# Patient Record
Sex: Female | Born: 1940 | Race: White | Hispanic: No | Marital: Single | Smoking: Never smoker
Health system: Southern US, Community
[De-identification: ages and names within clinical notes are randomized; demographics above are authoritative.]

## PROBLEM LIST (undated history)

## (undated) DIAGNOSIS — M8580 Other specified disorders of bone density and structure, unspecified site: Secondary | ICD-10-CM

## (undated) DIAGNOSIS — K529 Noninfective gastroenteritis and colitis, unspecified: Secondary | ICD-10-CM

## (undated) DIAGNOSIS — A809 Acute poliomyelitis, unspecified: Secondary | ICD-10-CM

## (undated) DIAGNOSIS — S5291XA Unspecified fracture of right forearm, initial encounter for closed fracture: Secondary | ICD-10-CM

## (undated) DIAGNOSIS — R011 Cardiac murmur, unspecified: Secondary | ICD-10-CM

## (undated) DIAGNOSIS — I00 Rheumatic fever without heart involvement: Secondary | ICD-10-CM

## (undated) DIAGNOSIS — G479 Sleep disorder, unspecified: Secondary | ICD-10-CM

## (undated) DIAGNOSIS — M353 Polymyalgia rheumatica: Secondary | ICD-10-CM

## (undated) DIAGNOSIS — E785 Hyperlipidemia, unspecified: Secondary | ICD-10-CM

## (undated) DIAGNOSIS — I639 Cerebral infarction, unspecified: Secondary | ICD-10-CM

## (undated) DIAGNOSIS — I10 Essential (primary) hypertension: Secondary | ICD-10-CM

## (undated) DIAGNOSIS — R58 Hemorrhage, not elsewhere classified: Secondary | ICD-10-CM

## (undated) DIAGNOSIS — K219 Gastro-esophageal reflux disease without esophagitis: Secondary | ICD-10-CM

## (undated) HISTORY — DX: Essential (primary) hypertension: I10

## (undated) HISTORY — DX: Rheumatic fever without heart involvement: I00

## (undated) HISTORY — DX: Hyperlipidemia, unspecified: E78.5

## (undated) HISTORY — PX: TONSILLECTOMY: SUR1361

## (undated) HISTORY — DX: Gastro-esophageal reflux disease without esophagitis: K21.9

## (undated) HISTORY — DX: Sleep disorder, unspecified: G47.9

## (undated) HISTORY — DX: Other specified disorders of bone density and structure, unspecified site: M85.80

## (undated) HISTORY — DX: Noninfective gastroenteritis and colitis, unspecified: K52.9

## (undated) HISTORY — PX: CATARACT EXTRACTION: SUR2

## (undated) HISTORY — DX: Cardiac murmur, unspecified: R01.1

## (undated) HISTORY — DX: Cerebral infarction, unspecified: I63.9

## (undated) HISTORY — DX: Polymyalgia rheumatica: M35.3

## (undated) HISTORY — DX: Hemorrhage, not elsewhere classified: R58

## (undated) HISTORY — DX: Acute poliomyelitis, unspecified: A80.9

---

## 1984-03-24 HISTORY — PX: BREAST LUMPECTOMY: SHX2

## 1984-03-24 HISTORY — PX: NECK SURGERY: SHX720

## 1997-09-13 ENCOUNTER — Ambulatory Visit (HOSPITAL_COMMUNITY): Admission: RE | Admit: 1997-09-13 | Discharge: 1997-09-13 | Payer: Self-pay | Admitting: Obstetrics and Gynecology

## 1997-11-24 ENCOUNTER — Other Ambulatory Visit: Admission: RE | Admit: 1997-11-24 | Discharge: 1997-11-24 | Payer: Self-pay | Admitting: Obstetrics and Gynecology

## 1998-10-01 ENCOUNTER — Encounter: Payer: Self-pay | Admitting: Obstetrics and Gynecology

## 1998-10-01 ENCOUNTER — Ambulatory Visit (HOSPITAL_COMMUNITY): Admission: RE | Admit: 1998-10-01 | Discharge: 1998-10-01 | Payer: Self-pay | Admitting: Obstetrics and Gynecology

## 1998-11-28 ENCOUNTER — Other Ambulatory Visit: Admission: RE | Admit: 1998-11-28 | Discharge: 1998-11-28 | Payer: Self-pay | Admitting: Obstetrics and Gynecology

## 1999-01-17 ENCOUNTER — Encounter (INDEPENDENT_AMBULATORY_CARE_PROVIDER_SITE_OTHER): Payer: Self-pay

## 1999-01-17 ENCOUNTER — Other Ambulatory Visit: Admission: RE | Admit: 1999-01-17 | Discharge: 1999-01-17 | Payer: Self-pay | Admitting: Ophthalmology

## 1999-10-08 ENCOUNTER — Encounter: Payer: Self-pay | Admitting: Obstetrics and Gynecology

## 1999-10-08 ENCOUNTER — Ambulatory Visit (HOSPITAL_COMMUNITY): Admission: RE | Admit: 1999-10-08 | Discharge: 1999-10-08 | Payer: Self-pay | Admitting: Obstetrics and Gynecology

## 1999-12-02 ENCOUNTER — Other Ambulatory Visit: Admission: RE | Admit: 1999-12-02 | Discharge: 1999-12-02 | Payer: Self-pay | Admitting: Obstetrics and Gynecology

## 1999-12-23 HISTORY — PX: DILATION AND CURETTAGE OF UTERUS: SHX78

## 2000-01-09 ENCOUNTER — Encounter: Payer: Self-pay | Admitting: Obstetrics and Gynecology

## 2000-01-14 ENCOUNTER — Encounter (INDEPENDENT_AMBULATORY_CARE_PROVIDER_SITE_OTHER): Payer: Self-pay

## 2000-01-14 ENCOUNTER — Ambulatory Visit (HOSPITAL_COMMUNITY): Admission: RE | Admit: 2000-01-14 | Discharge: 2000-01-14 | Payer: Self-pay | Admitting: Obstetrics and Gynecology

## 2000-04-22 ENCOUNTER — Ambulatory Visit (HOSPITAL_COMMUNITY): Admission: RE | Admit: 2000-04-22 | Discharge: 2000-04-22 | Payer: Self-pay | Admitting: Gastroenterology

## 2000-05-04 ENCOUNTER — Ambulatory Visit (HOSPITAL_COMMUNITY): Admission: RE | Admit: 2000-05-04 | Discharge: 2000-05-04 | Payer: Self-pay | Admitting: Obstetrics and Gynecology

## 2000-05-04 ENCOUNTER — Encounter: Payer: Self-pay | Admitting: Obstetrics and Gynecology

## 2000-10-13 ENCOUNTER — Ambulatory Visit (HOSPITAL_COMMUNITY): Admission: RE | Admit: 2000-10-13 | Discharge: 2000-10-13 | Payer: Self-pay | Admitting: Obstetrics and Gynecology

## 2000-10-13 ENCOUNTER — Encounter: Payer: Self-pay | Admitting: Obstetrics and Gynecology

## 2000-11-02 ENCOUNTER — Ambulatory Visit (HOSPITAL_COMMUNITY): Admission: RE | Admit: 2000-11-02 | Discharge: 2000-11-02 | Payer: Self-pay | Admitting: Obstetrics and Gynecology

## 2000-11-02 ENCOUNTER — Encounter: Payer: Self-pay | Admitting: Obstetrics and Gynecology

## 2000-12-21 ENCOUNTER — Other Ambulatory Visit: Admission: RE | Admit: 2000-12-21 | Discharge: 2000-12-21 | Payer: Self-pay | Admitting: Obstetrics and Gynecology

## 2001-05-04 ENCOUNTER — Encounter: Payer: Self-pay | Admitting: Obstetrics and Gynecology

## 2001-05-04 ENCOUNTER — Ambulatory Visit (HOSPITAL_COMMUNITY): Admission: RE | Admit: 2001-05-04 | Discharge: 2001-05-04 | Payer: Self-pay | Admitting: Obstetrics and Gynecology

## 2001-10-15 ENCOUNTER — Encounter: Payer: Self-pay | Admitting: Obstetrics and Gynecology

## 2001-10-15 ENCOUNTER — Ambulatory Visit (HOSPITAL_COMMUNITY): Admission: RE | Admit: 2001-10-15 | Discharge: 2001-10-15 | Payer: Self-pay | Admitting: Obstetrics and Gynecology

## 2002-10-20 ENCOUNTER — Ambulatory Visit (HOSPITAL_COMMUNITY): Admission: RE | Admit: 2002-10-20 | Discharge: 2002-10-20 | Payer: Self-pay | Admitting: Obstetrics and Gynecology

## 2002-10-20 ENCOUNTER — Encounter: Payer: Self-pay | Admitting: Obstetrics and Gynecology

## 2003-06-20 ENCOUNTER — Encounter: Admission: RE | Admit: 2003-06-20 | Discharge: 2003-06-20 | Payer: Self-pay | Admitting: Internal Medicine

## 2003-06-26 ENCOUNTER — Encounter: Admission: RE | Admit: 2003-06-26 | Discharge: 2003-06-26 | Payer: Self-pay | Admitting: Internal Medicine

## 2003-06-29 ENCOUNTER — Ambulatory Visit (HOSPITAL_COMMUNITY): Admission: RE | Admit: 2003-06-29 | Discharge: 2003-06-29 | Payer: Self-pay | Admitting: Gastroenterology

## 2003-11-14 ENCOUNTER — Ambulatory Visit (HOSPITAL_COMMUNITY): Admission: RE | Admit: 2003-11-14 | Discharge: 2003-11-14 | Payer: Self-pay | Admitting: Obstetrics and Gynecology

## 2004-03-24 HISTORY — PX: COLONOSCOPY: SHX174

## 2004-07-12 ENCOUNTER — Ambulatory Visit: Payer: Self-pay | Admitting: Gastroenterology

## 2004-07-15 ENCOUNTER — Ambulatory Visit: Payer: Self-pay | Admitting: Gastroenterology

## 2004-08-16 ENCOUNTER — Ambulatory Visit: Payer: Self-pay | Admitting: Gastroenterology

## 2004-09-18 ENCOUNTER — Ambulatory Visit: Payer: Self-pay | Admitting: Gastroenterology

## 2004-11-14 ENCOUNTER — Ambulatory Visit: Payer: Self-pay | Admitting: Gastroenterology

## 2004-11-19 ENCOUNTER — Ambulatory Visit (HOSPITAL_COMMUNITY): Admission: RE | Admit: 2004-11-19 | Discharge: 2004-11-19 | Payer: Self-pay | Admitting: Obstetrics and Gynecology

## 2004-12-05 ENCOUNTER — Encounter: Payer: Self-pay | Admitting: Internal Medicine

## 2004-12-09 ENCOUNTER — Ambulatory Visit: Payer: Self-pay | Admitting: Gastroenterology

## 2004-12-13 ENCOUNTER — Ambulatory Visit: Payer: Self-pay | Admitting: Gastroenterology

## 2005-01-13 ENCOUNTER — Ambulatory Visit: Payer: Self-pay | Admitting: Gastroenterology

## 2005-01-22 ENCOUNTER — Encounter: Payer: Self-pay | Admitting: Internal Medicine

## 2005-01-22 LAB — CONVERTED CEMR LAB

## 2005-02-14 ENCOUNTER — Ambulatory Visit: Payer: Self-pay | Admitting: Internal Medicine

## 2005-02-20 ENCOUNTER — Ambulatory Visit: Payer: Self-pay

## 2005-03-07 ENCOUNTER — Ambulatory Visit: Payer: Self-pay | Admitting: Gastroenterology

## 2005-03-24 HISTORY — PX: MELANOMA EXCISION: SHX5266

## 2005-05-30 ENCOUNTER — Ambulatory Visit: Payer: Self-pay | Admitting: Gastroenterology

## 2005-08-25 ENCOUNTER — Ambulatory Visit: Payer: Self-pay | Admitting: Gastroenterology

## 2005-08-28 ENCOUNTER — Ambulatory Visit: Payer: Self-pay | Admitting: Internal Medicine

## 2005-09-09 ENCOUNTER — Ambulatory Visit: Payer: Self-pay | Admitting: Internal Medicine

## 2005-11-14 ENCOUNTER — Ambulatory Visit: Payer: Self-pay | Admitting: Gastroenterology

## 2005-11-21 ENCOUNTER — Ambulatory Visit (HOSPITAL_COMMUNITY): Admission: RE | Admit: 2005-11-21 | Discharge: 2005-11-21 | Payer: Self-pay | Admitting: Obstetrics and Gynecology

## 2005-12-17 ENCOUNTER — Ambulatory Visit (HOSPITAL_BASED_OUTPATIENT_CLINIC_OR_DEPARTMENT_OTHER): Admission: RE | Admit: 2005-12-17 | Discharge: 2005-12-17 | Payer: Self-pay | Admitting: *Deleted

## 2005-12-17 ENCOUNTER — Encounter (INDEPENDENT_AMBULATORY_CARE_PROVIDER_SITE_OTHER): Payer: Self-pay | Admitting: Specialist

## 2006-02-05 ENCOUNTER — Ambulatory Visit: Payer: Self-pay | Admitting: Internal Medicine

## 2006-02-05 LAB — CONVERTED CEMR LAB
Cholesterol: 200 mg/dL (ref 0–200)
HDL: 69 mg/dL (ref >39.0)
LDL Cholesterol: 108 mg/dL — ABNORMAL HIGH (ref 0–99)
Triglyceride fasting, serum: 115 mg/dL (ref 0–149)
VLDL: 23 mg/dL (ref 0–40)

## 2006-02-09 ENCOUNTER — Ambulatory Visit: Payer: Self-pay | Admitting: Gastroenterology

## 2006-05-04 ENCOUNTER — Ambulatory Visit: Payer: Self-pay | Admitting: Gastroenterology

## 2006-05-18 ENCOUNTER — Ambulatory Visit: Payer: Self-pay | Admitting: Gastroenterology

## 2006-09-07 ENCOUNTER — Ambulatory Visit: Payer: Self-pay | Admitting: Gastroenterology

## 2006-11-24 ENCOUNTER — Ambulatory Visit (HOSPITAL_COMMUNITY): Admission: RE | Admit: 2006-11-24 | Discharge: 2006-11-24 | Payer: Self-pay | Admitting: Internal Medicine

## 2006-11-30 ENCOUNTER — Ambulatory Visit: Payer: Self-pay | Admitting: Gastroenterology

## 2007-03-01 ENCOUNTER — Ambulatory Visit: Payer: Self-pay | Admitting: Gastroenterology

## 2007-06-07 ENCOUNTER — Telehealth (INDEPENDENT_AMBULATORY_CARE_PROVIDER_SITE_OTHER): Payer: Self-pay | Admitting: *Deleted

## 2007-06-08 ENCOUNTER — Telehealth (INDEPENDENT_AMBULATORY_CARE_PROVIDER_SITE_OTHER): Payer: Self-pay | Admitting: *Deleted

## 2007-06-08 ENCOUNTER — Encounter: Payer: Self-pay | Admitting: Internal Medicine

## 2007-06-08 DIAGNOSIS — M899 Disorder of bone, unspecified: Secondary | ICD-10-CM | POA: Insufficient documentation

## 2007-06-08 DIAGNOSIS — M949 Disorder of cartilage, unspecified: Secondary | ICD-10-CM

## 2007-06-08 DIAGNOSIS — K5190 Ulcerative colitis, unspecified, without complications: Secondary | ICD-10-CM

## 2007-06-08 DIAGNOSIS — R011 Cardiac murmur, unspecified: Secondary | ICD-10-CM

## 2007-06-18 ENCOUNTER — Ambulatory Visit: Payer: Self-pay | Admitting: Internal Medicine

## 2007-06-18 DIAGNOSIS — R498 Other voice and resonance disorders: Secondary | ICD-10-CM | POA: Insufficient documentation

## 2007-06-18 DIAGNOSIS — I10 Essential (primary) hypertension: Secondary | ICD-10-CM | POA: Insufficient documentation

## 2007-06-18 DIAGNOSIS — J309 Allergic rhinitis, unspecified: Secondary | ICD-10-CM | POA: Insufficient documentation

## 2007-06-19 LAB — CONVERTED CEMR LAB
Creatinine, Ser: 0.8 mg/dL (ref 0.4–1.2)
Free T4: 0.7 ng/dL (ref 0.6–1.6)
Potassium: 4.2 meq/L (ref 3.5–5.1)
TSH: 1.06 microintl units/mL (ref 0.35–5.50)

## 2007-06-21 ENCOUNTER — Encounter (INDEPENDENT_AMBULATORY_CARE_PROVIDER_SITE_OTHER): Payer: Self-pay | Admitting: *Deleted

## 2007-06-22 ENCOUNTER — Encounter: Payer: Self-pay | Admitting: Internal Medicine

## 2007-06-23 ENCOUNTER — Encounter (INDEPENDENT_AMBULATORY_CARE_PROVIDER_SITE_OTHER): Payer: Self-pay | Admitting: *Deleted

## 2007-07-05 ENCOUNTER — Telehealth (INDEPENDENT_AMBULATORY_CARE_PROVIDER_SITE_OTHER): Payer: Self-pay | Admitting: *Deleted

## 2007-07-08 ENCOUNTER — Encounter: Payer: Self-pay | Admitting: Internal Medicine

## 2007-07-12 ENCOUNTER — Ambulatory Visit: Payer: Self-pay | Admitting: Internal Medicine

## 2007-08-31 ENCOUNTER — Ambulatory Visit: Payer: Self-pay | Admitting: Gastroenterology

## 2007-10-05 ENCOUNTER — Ambulatory Visit: Payer: Self-pay | Admitting: Gastroenterology

## 2007-11-26 ENCOUNTER — Ambulatory Visit (HOSPITAL_COMMUNITY): Admission: RE | Admit: 2007-11-26 | Discharge: 2007-11-26 | Payer: Self-pay | Admitting: Internal Medicine

## 2008-11-09 ENCOUNTER — Ambulatory Visit: Payer: Self-pay | Admitting: Internal Medicine

## 2008-11-09 DIAGNOSIS — R7309 Other abnormal glucose: Secondary | ICD-10-CM

## 2008-11-09 DIAGNOSIS — E785 Hyperlipidemia, unspecified: Secondary | ICD-10-CM | POA: Insufficient documentation

## 2008-11-14 ENCOUNTER — Ambulatory Visit: Payer: Self-pay | Admitting: Internal Medicine

## 2008-11-28 ENCOUNTER — Ambulatory Visit (HOSPITAL_COMMUNITY): Admission: RE | Admit: 2008-11-28 | Discharge: 2008-11-28 | Payer: Self-pay | Admitting: Internal Medicine

## 2008-11-28 ENCOUNTER — Encounter (INDEPENDENT_AMBULATORY_CARE_PROVIDER_SITE_OTHER): Payer: Self-pay | Admitting: *Deleted

## 2008-11-28 LAB — CONVERTED CEMR LAB
ALT: 13 units/L (ref 0–35)
BUN: 18 mg/dL (ref 6–23)
Basophils Relative: 0.2 % (ref 0.0–3.0)
Bilirubin, Direct: 0 mg/dL (ref 0.0–0.3)
CO2: 33 meq/L — ABNORMAL HIGH (ref 19–32)
Chloride: 100 meq/L (ref 96–112)
Eosinophils Relative: 0.3 % (ref 0.0–5.0)
GFR calc non Af Amer: 75.59 mL/min (ref >60)
Glucose, Bld: 91 mg/dL (ref 70–99)
HCT: 38.6 % (ref 36.0–46.0)
HDL: 60.4 mg/dL (ref >39.00)
Hgb A1c MFr Bld: 5.6 % (ref 4.6–6.5)
LDL Cholesterol: 105 mg/dL — ABNORMAL HIGH (ref 0–99)
Lymphs Abs: 0.7 10*3/uL (ref 0.7–4.0)
MCV: 88.8 fL (ref 78.0–100.0)
Monocytes Absolute: 0.5 10*3/uL (ref 0.1–1.0)
Monocytes Relative: 9.4 % (ref 3.0–12.0)
Platelets: 138 10*3/uL — ABNORMAL LOW (ref 150.0–400.0)
Potassium: 3.7 meq/L (ref 3.5–5.1)
RBC: 4.35 M/uL (ref 3.87–5.11)
Sodium: 140 meq/L (ref 135–145)
TSH: 1.72 microintl units/mL (ref 0.35–5.50)
Total Bilirubin: 0.8 mg/dL (ref 0.3–1.2)
Total CHOL/HDL Ratio: 3
VLDL: 25.2 mg/dL (ref 0.0–40.0)
Vit D, 25-Hydroxy: 27 ng/mL — ABNORMAL LOW (ref 30–89)
WBC: 5.2 10*3/uL (ref 4.5–10.5)

## 2008-12-01 ENCOUNTER — Ambulatory Visit: Payer: Self-pay | Admitting: Internal Medicine

## 2008-12-01 ENCOUNTER — Ambulatory Visit: Payer: Self-pay | Admitting: Family Medicine

## 2008-12-01 DIAGNOSIS — S51809A Unspecified open wound of unspecified forearm, initial encounter: Secondary | ICD-10-CM

## 2008-12-05 ENCOUNTER — Encounter: Payer: Self-pay | Admitting: Internal Medicine

## 2008-12-07 LAB — CONVERTED CEMR LAB
Alpha 1, Urine: DETECTED % — AB
Alpha 2, Urine: DETECTED % — AB
Beta, Urine: DETECTED % — AB
Total Protein, Urine-Ur/day: 50 mg/24hr (ref 10–140)
Total Protein, Urine: 9.9 mg/dL

## 2008-12-08 ENCOUNTER — Telehealth (INDEPENDENT_AMBULATORY_CARE_PROVIDER_SITE_OTHER): Payer: Self-pay | Admitting: *Deleted

## 2008-12-15 ENCOUNTER — Ambulatory Visit: Payer: Self-pay | Admitting: Internal Medicine

## 2008-12-15 DIAGNOSIS — L259 Unspecified contact dermatitis, unspecified cause: Secondary | ICD-10-CM

## 2009-02-01 ENCOUNTER — Ambulatory Visit: Payer: Self-pay | Admitting: Gastroenterology

## 2009-02-01 ENCOUNTER — Encounter: Payer: Self-pay | Admitting: Internal Medicine

## 2009-02-02 ENCOUNTER — Encounter (INDEPENDENT_AMBULATORY_CARE_PROVIDER_SITE_OTHER): Payer: Self-pay | Admitting: *Deleted

## 2009-02-13 ENCOUNTER — Ambulatory Visit: Payer: Self-pay | Admitting: Cardiology

## 2009-02-13 ENCOUNTER — Encounter: Payer: Self-pay | Admitting: Internal Medicine

## 2009-02-13 ENCOUNTER — Ambulatory Visit (HOSPITAL_COMMUNITY): Admission: RE | Admit: 2009-02-13 | Discharge: 2009-02-13 | Payer: Self-pay | Admitting: Internal Medicine

## 2009-02-13 ENCOUNTER — Ambulatory Visit: Payer: Self-pay

## 2009-02-14 ENCOUNTER — Encounter (INDEPENDENT_AMBULATORY_CARE_PROVIDER_SITE_OTHER): Payer: Self-pay | Admitting: *Deleted

## 2009-02-26 ENCOUNTER — Telehealth (INDEPENDENT_AMBULATORY_CARE_PROVIDER_SITE_OTHER): Payer: Self-pay | Admitting: *Deleted

## 2009-06-28 ENCOUNTER — Ambulatory Visit: Payer: Self-pay | Admitting: Internal Medicine

## 2009-06-28 DIAGNOSIS — R350 Frequency of micturition: Secondary | ICD-10-CM

## 2009-06-28 DIAGNOSIS — M545 Low back pain: Secondary | ICD-10-CM

## 2009-06-28 DIAGNOSIS — G479 Sleep disorder, unspecified: Secondary | ICD-10-CM

## 2009-06-28 LAB — CONVERTED CEMR LAB
Bilirubin Urine: NEGATIVE
Ketones, urine, test strip: NEGATIVE
Nitrite: POSITIVE
Protein, U semiquant: 30
Urobilinogen, UA: 0.2

## 2009-07-20 ENCOUNTER — Telehealth (INDEPENDENT_AMBULATORY_CARE_PROVIDER_SITE_OTHER): Payer: Self-pay | Admitting: *Deleted

## 2009-07-20 ENCOUNTER — Ambulatory Visit: Payer: Self-pay | Admitting: Internal Medicine

## 2009-07-24 ENCOUNTER — Encounter: Payer: Self-pay | Admitting: Internal Medicine

## 2009-07-25 ENCOUNTER — Ambulatory Visit: Payer: Self-pay | Admitting: Internal Medicine

## 2009-07-31 ENCOUNTER — Encounter: Payer: Self-pay | Admitting: Internal Medicine

## 2009-08-23 ENCOUNTER — Telehealth (INDEPENDENT_AMBULATORY_CARE_PROVIDER_SITE_OTHER): Payer: Self-pay | Admitting: *Deleted

## 2009-09-06 ENCOUNTER — Ambulatory Visit: Payer: Self-pay | Admitting: Internal Medicine

## 2009-09-06 DIAGNOSIS — M546 Pain in thoracic spine: Secondary | ICD-10-CM | POA: Insufficient documentation

## 2009-09-06 DIAGNOSIS — R079 Chest pain, unspecified: Secondary | ICD-10-CM

## 2009-09-06 LAB — CONVERTED CEMR LAB
Bilirubin Urine: NEGATIVE
Blood in Urine, dipstick: NEGATIVE
Glucose, Urine, Semiquant: NEGATIVE
Protein, U semiquant: NEGATIVE
Urobilinogen, UA: 0.2
pH: 7

## 2009-09-07 ENCOUNTER — Ambulatory Visit: Payer: Self-pay | Admitting: Internal Medicine

## 2009-10-03 ENCOUNTER — Telehealth: Payer: Self-pay | Admitting: Internal Medicine

## 2009-11-30 ENCOUNTER — Ambulatory Visit (HOSPITAL_COMMUNITY): Admission: RE | Admit: 2009-11-30 | Discharge: 2009-11-30 | Payer: Self-pay | Admitting: Internal Medicine

## 2010-04-21 LAB — CONVERTED CEMR LAB
Albumin ELP: 50.7 % — ABNORMAL LOW (ref 55.8–66.1)
Alpha-1-Globulin: 5.9 % — ABNORMAL HIGH (ref 2.9–4.9)
Alpha-2-Globulin: 13 % — ABNORMAL HIGH (ref 7.1–11.8)
Cholesterol, target level: 200 mg/dL
Gamma Globulin: 18.8 % (ref 11.1–18.8)
Total Protein, Serum Electrophoresis: 8.1 g/dL (ref 6.0–8.3)

## 2010-04-25 NOTE — Assessment & Plan Note (Signed)
Summary: back issues//lch   Vital Signs:  Patient profile:   70 year old female Weight:      112 pounds Temp:     98.9 degrees F oral Pulse rate:   72 / minute Resp:     14 per minute BP sitting:   114 / 78  (left arm)  Vitals Entered By: Shonna Chock (June 28, 2009 3:23 PM) CC: Lower backpain x 2 weeks, frequent urination, Insomnia Comments REVIEWED MED LIST, PATIENT AGREED DOSE AND INSTRUCTION CORRECT    Primary Care Provider:  Marga Melnick, MD  CC:  Lower backpain x 2 weeks, frequent urination, and Insomnia.  History of Present Illness: R & L intermittent  LS area pain X 1 week with occasional anterior radiation. Frequency this week; nocturia X 3 last night. Rx: Tylenol w/o help. No back trauma ; no PMH of LBP or PMH of UTIs.  Insomnia      This is a 70 year old woman who also presents with sleep disfunction/insomnia.  The patient reports frequent awakening, but denies leg movements, snoring, apnea or  daytime somnolence.  She denies behaviors that may contribute to insomnia  such as reading in bed, watching TV in bed, daytime naps, OTC sleep aids,or  alcohol use.  The back pain has exacerabted this issue acutely.  Allergies (verified): No Known Drug Allergies  Review of Systems General:  Complains of chills, fever, and sweats. GU:  Denies discharge, dysuria, hematuria, incontinence, and urinary hesitancy.  Physical Exam  General:  Thinin no acute distress; alert,appropriate and cooperative throughout examination Abdomen:  Bowel sounds positive,abdomen soft but minimally tender MID abdomen  without masses, organomegaly or hernias noted. Msk:  Sat up w/o help; minimally tender LS area Extremities:   normal full range of motion of all joints; neg SLR .   Neurologic:  strength normal in lower extremities, sensation intact to light touch, and gait (heel/toe)normal.   Skin:  Intact without suspicious lesions or rashes Cervical Nodes:  No lymphadenopathy noted Axillary  Nodes:  No palpable lymphadenopathy Psych:  memory intact for recent and remote, normally interactive, and good eye contact.     Impression & Recommendations:  Problem # 1:  BACK PAIN, LUMBAR (ICD-724.2)  Her updated medication list for this problem includes:    Tramadol Hcl 50 Mg Tabs (Tramadol hcl) .Marland Kitchen... 1 every 6 hrs as needed pain  Problem # 2:  URINARY FREQUENCY (ICD-788.41)  Problem # 3:  SLEEP DISORDER (ICD-780.50) If sleep hygiene & valerian are ineffective after acute issues resolve; Clonazepam can be employed as a trial  Complete Medication List: 1)  Benazepril-hydrochlorothiazide 20-12.5 Mg Tabs (Benazepril-hydrochlorothiazide) .... 1/2 qam ** office visit, labs due now** 2)  Asacol 400 Mg Tbec (Mesalamine) .... Administer 6 tabs q am 3)  Caltrate 600+d Plus 600-400 Mg-unit Chew (Calcium carbonate-vit d-min) .... Take 2 tablets daily 4)  Multivitamins Tabs (Multiple vitamin) .... One tablet by mouth once daily 5)  Vitamin E 400 Unit Caps (Vitamin e) .... One tablet by mouth once daily 6)  Amoxicillin 500 Mg Caps (Amoxicillin) .... Take four tablets one hour before dentist appointment 7)  Loratadine 10 Mg Tabs (Loratadine) .Marland Kitchen.. 1 once daily as needed allergies 8)  Vitamin D 2000 Unit Tabs (Cholecalciferol) .... Take 1 tab once daily 9)  Zyrtec Hives Relief 10 Mg Tabs (Cetirizine hcl) .... As needed 10)  Tramadol Hcl 50 Mg Tabs (Tramadol hcl) .Marland Kitchen.. 1 every 6 hrs as needed pain 11)  Ciprofloxacin Hcl  500 Mg Tabs (Ciprofloxacin hcl) .Marland Kitchen.. 1 two times a day with 8 oz water  Other Orders: UA Dipstick w/o Micro (manual) (16109) T-Culture, Urine ([id number]) T-Culture, Urine ([id number])  Patient Instructions: 1)  Drink as much fluid as you can tolerate for the next few days. Sleep hygiene as discussed & Valerian at bedtime as needed.  Prescriptions: CIPROFLOXACIN HCL 500 MG TABS (CIPROFLOXACIN HCL) 1 two times a day with 8 oz water  #20 x 0   Entered and Authorized by:    Marga Melnick MD   Signed by:   Marga Melnick MD on 06/28/2009   Method used:   Faxed to ...       [facility]  [address] [phone]* (retail)       [address] [place], [place]  [address]       Ph: [phone] or [phone]       Fax: [phone]   RxID:   [id number] TRAMADOL HCL 50 MG TABS (TRAMADOL HCL) 1 every 6 hrs as needed pain  #30 x 1   Entered and Authorized by:   Marga Melnick MD   Signed by:   Marga Melnick MD on 06/28/2009   Method used:   Faxed to ...       [facility]  [address] [phone]* (retail)       [address] [place], [place]  [address]       Ph: [phone] or [phone]       Fax: [phone]   RxID:   [id number]   Laboratory Results   Urine Tests    Routine Urinalysis   Color: yellow Appearance: Cloudy Glucose: negative   (Normal Range: Negative) Bilirubin: negative   (Normal Range: Negative) Ketone: negative   (Normal Range: Negative) Spec. Gravity: >=1.030   (Normal Range: 1.003-1.035) Blood: negative   (Normal Range: Negative) pH: 5.0   (Normal Range: 5.0-8.0) Protein: 30   (Normal Range: Negative) Urobilinogen: 0.2   (Normal Range: 0-1) Nitrite: positive   (Normal Range: Negative) Leukocyte Esterace: large   (Normal Range: Negative)    Comments: Sent for Culture

## 2010-04-25 NOTE — Assessment & Plan Note (Signed)
Summary: tetnus-pneumomia inj/cbs  Nurse Visit   Allergies: No Known Drug Allergies  Immunization History:  Influenza Immunization History:    Influenza:  historical (01/17/2009)  Immunizations Administered:  Tetanus Vaccine:    Vaccine Type: Tdap    Site: left deltoid    Mfr: GlaxoSmithKline    Dose: 0.5 ml    Route: IM    Given by: Chrae Malloy    Exp. Date: 06/16/2011    Lot #: [id number]    VIS given: 02/09/07 version given Jul 25, 2009.  Pneumonia Vaccine:    Vaccine Type: Pneumovax (Medicare)    Site: right deltoid    Mfr: Merck    Dose: 0.5 ml    Route: IM    Given by: Shonna Chock    Exp. Date: 03/02/2010    Lot #: [id number]  Orders Added: 1)  Tdap => 48yrs IM [90715] 2)  Admin 1st Vaccine [90471] 3)  Pneumococcal Vaccine [90732] 4)  Admin of Any Addtl Vaccine [90472] 5)  Misc. Referral [Misc. Ref]  Last Tb Skin Test 11/06/2008 at [facility]. Shonna Chock  Jul 25, 2009 4:25 PM

## 2010-04-25 NOTE — Miscellaneous (Signed)
Summary: Health Assessment Form/[facility]  Health Assessment Form/[facility]   Imported By: Lanelle Bal 07/31/2009 08:08:59  _____________________________________________________________________  External Attachment:    Type:   Image     Comment:   External Document

## 2010-04-25 NOTE — Progress Notes (Signed)
Summary: Request for BMD Results  Phone Note Call from Patient Call back at Home Phone [phone]   Caller: Patient Summary of Call: Message left on VM: patient would like BMD results   Chrae [facility]  August 23, 2009 4:56 PM   Follow-up for Phone Call        Spoke with patient BMD was mailed to her yesterday: Dr.Hopper wrote 1.) Some bone loss at all sites  2.) Monitor Vit D yearly  3.) BMD every 25 months  patient ok'd all information Follow-up by: Shonna Chock,  August 23, 2009 5:00 PM

## 2010-04-25 NOTE — Progress Notes (Signed)
Summary: PAPERWORK FOR [facility] TO BE COMPLETED  Phone Note Call from Patient Call back at Home Phone [phone]   Caller: Patient Summary of Call: PATIENT DROPPED OFF PAPERWORK FOR [facility]---STATES THAT SHE HAS HAD A TB TEST DURING HER BIRTH MONTH (AUGUST) SINCE SHE VOLUNTEERS AT THE HOSPITAL  PLEASE FAX TO THE NUMBER ON THE FORM AND THEN CALL THE PATIENT TO ADVISE HER THAT IT HAS BEEN FAXED  WILL TAKE FORM IN PLASTIC SLEEVE TO [facility] Initial call taken by: Jerolyn Shin,  July 20, 2009 2:32 PM  Follow-up for Phone Call        form place on ledge for completion..........Marland KitchenFelecia Deloach CMA  July 20, 2009 4:18 PM    left pt message to return call  inregard to shot records............Marland KitchenFelecia Deloach CMA  July 20, 2009 4:19 PM   Additional Follow-up for Phone Call Additional follow up Details #1::        needs Problem List & Med List attached. Also may need PPD & Pneumovax(see last page) Additional Follow-up by: Marga Melnick MD,  Jul 24, 2009 6:31 AM    Additional Follow-up for Phone Call Additional follow up Details #2::    forms placed up front pt aware................Marland KitchenFelecia Deloach CMA  Jul 24, 2009 9:36 AM

## 2010-04-25 NOTE — Progress Notes (Signed)
Summary: Lorazepam-Request to D/C med  Phone Note Call from Patient   Caller: Patient Summary of Call: Pt calls stating that she believes that she doesnt need to take lorazepam anymore. Family and Friends are telling her that she seems better.Please advise [phone] Initial call taken by: Lavell Islam,  October 03, 2009 9:17 AM  Follow-up for Phone Call        Dr.Kevionna Heffler please advise how patient should wean off med of can she stop and monitor if needed? Follow-up by: Shonna Chock CMA,  October 03, 2009 10:14 AM  Additional Follow-up for Phone Call Additional follow up Details #1::        PATIENT HAS MOVED [facility]--IF YOU CALL AROUND 5PM, SHE MAY BE AT DINNER---SAYS IT IS OK TO LEAVE A MESSAGE ON HER ANSWERING MACHINE  (ADDRESS AND PHONE INFO HAVE BEEN UPDATED) Additional Follow-up by: Jerolyn Shin,  October 03, 2009 12:37 PM    Additional Follow-up for Phone Call Additional follow up Details #2::    take it every 12 hrs as needed for 3-5 days ,then 1 once daily as needed X 3-5 days & then D/Cit Follow-up by: Marga Melnick MD,  October 03, 2009 1:06 PM  Additional Follow-up for Phone Call Additional follow up Details #3:: Details for Additional Follow-up Action Taken: left message on machine to call back to office. Lucious Groves  October 03, 2009 2:08 PM   Patient notified and expressed understanding. Lucious Groves CMA  October 03, 2009 3:20 PM

## 2010-04-25 NOTE — Assessment & Plan Note (Signed)
Summary: PAIN IN LEFT SIDE--STABBING PAIN--FRONT AND BACK--LOOSING WEI.Marland KitchenMarland Kitchen   Vital Signs:  Patient profile:   70 year old female Weight:      106.6 pounds Temp:     98.4 degrees F oral Pulse rate:   72 / minute Resp:     17 per minute BP sitting:   148 / 82  (left arm) Cuff size:   regular  Vitals Entered By: Shonna Chock (September 06, 2009 3:12 PM) CC: Lower abdominal pain on left side that radiates to the lower left side of back, patient unaware of when pain onset, Back pain Comments REVIEWED MED LIST, PATIENT AGREED DOSE AND INSTRUCTION CORRECT    Primary Care Provider:  Marga Melnick, MD  CC:  Lower abdominal pain on left side that radiates to the lower left side of back, patient unaware of when pain onset, and Back pain.  History of Present Illness:  Back Pain      This is a 70 year old woman who presents with dull to stabbing back pain for 10 days.  The patient reports rest pain, but denies fever, chills, weakness, loss of sensation, fecal incontinence, urinary incontinence, urinary retention, dysuria, inability to work, and inability to care for self.  The pain is located in the left thoracic back & occasionally radiates to LUQ.  The pain began at home, suddenly, and after lifting. She has been packing to move to [facility] The pain is made worse by activity.  The pain is made better by Tramadol 50 mg every 12 hrs .  Risk factors for serious underlying conditions include age >= 50 years.    Allergies (verified): No Known Drug Allergies  Review of Systems General:  Denies chills. Resp:  Complains of chest pain with inspiration; denies cough, coughing up blood, shortness of breath, sputum productive, and wheezing. GI:  Complains of loss of appetite; denies abdominal pain, bloody stools, dark tarry stools, and indigestion; PMH of colitis. GU:  Denies discharge and hematuria. Derm:  Denies lesion(s) and rash. Psych:  Complains of anxiety; Anxious about moving; "I really don't  want to move".  Physical Exam  General:  Thin,in no acute distress; alert,appropriate and cooperative throughout examination Eyes:  No corneal or conjunctival inflammation noted.No icterus  Mouth:  Oral mucosa and oropharynx without lesions or exudates.  Teeth in good repair. No pharyngeal erythema.   Chest Wall:  Some mild discomfort to compression L inferior ant thorax Lungs:  Normal respiratory effort, chest expands symmetrically. Lungs are clear to auscultation, no crackles or wheezes. Heart:  normal rate, no murmur, no gallop, no rub, no JVD, no HJR, and grade 1 /6 systolic murmur.   Abdomen:  Bowel sounds positive,abdomen soft and non-tender without masses, organomegaly or hernias noted. Msk:  Slighltly tender to percussion L flank Neurologic:  alert & oriented X3, strength normal in all extremities, and DTRs symmetrical and normal.   Skin:  Intact without suspicious lesions or rashes Cervical Nodes:  No lymphadenopathy noted Axillary Nodes:  No palpable lymphadenopathy Psych:  normally interactive and slightly anxious.   History somewhat vague   Impression & Recommendations:  Problem # 1:  BACK PAIN, THORACIC REGION (ICD-724.1) Probably frompacking Her updated medication list for this problem includes:    Tramadol Hcl 50 Mg Tabs (Tramadol hcl) .Marland Kitchen... 1 every 6 hrs as needed pain    Cyclobenzaprine Hcl 5 Mg Tabs (Cyclobenzaprine hcl) .Marland Kitchen... 1-2 at bedtime as needed for pain  Orders: T-2 View CXR (71020TC)  Problem #  2:  CHEST PAIN (ICD-786.50)  tender  L inferior thorax to palpation  Orders: T-2 View CXR (71020TC)  Complete Medication List: 1)  Benazepril-hydrochlorothiazide 20-12.5 Mg Tabs (Benazepril-hydrochlorothiazide) .... 1/2 qam ** office visit, labs due now** 2)  Asacol 400 Mg Tbec (Mesalamine) .... Administer 6 tabs q am 3)  Caltrate 600+d Plus 600-400 Mg-unit Chew (Calcium carbonate-vit d-min) .... Take 2 tablets daily 4)  Multivitamins Tabs (Multiple vitamin)  .... One tablet by mouth once daily 5)  Vitamin E 400 Unit Caps (Vitamin e) .... One tablet by mouth once daily 6)  Amoxicillin 500 Mg Caps (Amoxicillin) .... Take four tablets one hour before dentist appointment 7)  Loratadine 10 Mg Tabs (Loratadine) .Marland Kitchen.. 1 once daily as needed allergies 8)  Vitamin D 2000 Unit Tabs (Cholecalciferol) .... Take 1 tab once daily 9)  Zyrtec Hives Relief 10 Mg Tabs (Cetirizine hcl) .... As needed 10)  Tramadol Hcl 50 Mg Tabs (Tramadol hcl) .Marland Kitchen.. 1 every 6 hrs as needed pain 11)  Cyclobenzaprine Hcl 5 Mg Tabs (Cyclobenzaprine hcl) .Marland Kitchen.. 1-2 at bedtime as needed for pain 12)  Lorazepam 0.5 Mg Tabs (Lorazepam) .Marland Kitchen.. 1 every 8 hrs as needed for anxiety  Other Orders: UA Dipstick w/o Micro (manual) (04540)  Patient Instructions: 1)  Increase Tramadol to every 6 hrs as needed . Keep a Diary of pain & possible triggers Prescriptions: LORAZEPAM 0.5 MG TABS (LORAZEPAM) 1 every 8 hrs as needed for anxiety  #30 x 2   Entered and Authorized by:   Marga Melnick MD   Signed by:   Marga Melnick MD on 09/06/2009   Method used:   Printed then faxed to ...       [facility]  [address] [phone]* (retail)       [address] [place], [place]  [address]       Ph: [phone] or [phone]       Fax: [phone]   RxID:   [id number] CYCLOBENZAPRINE HCL 5 MG TABS (CYCLOBENZAPRINE HCL) 1-2 at bedtime as needed for pain  #20 x 0   Entered and Authorized by:   Marga Melnick MD   Signed by:   Marga Melnick MD on 09/06/2009   Method used:   Faxed to ...       [facility]  [address] [phone]* (retail)       [address] [place], [place]  [address]       Ph: [phone] or [phone]       Fax: [phone]   RxID:   [id number]   Laboratory Results   Urine Tests    Routine Urinalysis   Color: yellow Appearance: Clear Glucose: negative   (Normal Range: Negative) Bilirubin: negative   (Normal Range: Negative) Ketone: trace (5)   (Normal Range:  Negative) Spec. Gravity: 1.010   (Normal Range: 1.003-1.035) Blood: negative   (Normal Range: Negative) pH: 7.0   (Normal Range: 5.0-8.0) Protein: negative   (Normal Range: Negative) Urobilinogen: 0.2   (Normal Range: 0-1) Nitrite: negative   (Normal Range: Negative) Leukocyte Esterace: negative   (Normal Range: Negative)

## 2010-06-22 ENCOUNTER — Encounter: Payer: Self-pay | Admitting: Internal Medicine

## 2010-06-24 ENCOUNTER — Ambulatory Visit (INDEPENDENT_AMBULATORY_CARE_PROVIDER_SITE_OTHER): Payer: Medicare Other | Admitting: Internal Medicine

## 2010-06-24 ENCOUNTER — Encounter: Payer: Self-pay | Admitting: Internal Medicine

## 2010-06-24 VITALS — BP 126/68 | HR 72 | Temp 98.2°F | Wt 114.2 lb

## 2010-06-24 DIAGNOSIS — M7542 Impingement syndrome of left shoulder: Secondary | ICD-10-CM

## 2010-06-24 MED ORDER — MELOXICAM 7.5 MG PO TABS: 7.5000 mg | ORAL_TABLET | Freq: Every day | ORAL | Status: AC

## 2010-06-24 NOTE — Patient Instructions (Signed)
I recommend physical therapy be initiated at [facility]. If you  cannot tolerate half of a Tylenol every 6 hours as needed for the pain then  use the meloxicam 7.5 mg twice a day as needed. If you're not better with these medications and physical therapy an orthopedic referral is appropriate.

## 2010-06-24 NOTE — Progress Notes (Signed)
  Subjective:    Patient ID: Tammy Beard, female    DOB: [birth year], 70 y.o.   MRN: [id number]  HPI Onset 06/18/2010 of L  shoulder pain upon awakening.  Severity:now  5  on 10 scale; initially 8  Pain is described as: sharp  Worse with: elevation LUE   Better with:ES Tylenol ; "Tramadol made me dopey" Pain radiates to: L elbow  Impaired range of motion: due to pain  History of repetitive motion:  no  History of overuse or hyperextension:  no  History of trauma:  no   Past history of similar problem:  yes, not as severe & resolved with meds   Symptoms Back Pain:  no  Numbness/tingling:  no  Weakness:  no  Red Flags Fever:  no  Headache:  no  Bowel/bladder dysfunction:  no      Review of Systems     Objective:   Physical Exam she is thin but appears well nourished. She is in no acute distress.  She has no scleral icterus or jaundice  She has no lymphadenopathy about the head neck or axilla  She has an S4 with a grade 1 systolic murmur. Chest is clear with no increase work of breathing.  Strength in the extremities is normal as are deep tendon reflexes.  She has decreased range of motion at the left shoulder with crepitus on passive range of motion.          Assessment & Plan:  #1 shoulder impingement syndrome  Plan: #1 she can try low doses of the tramadol specifically one half pill every 6 hours as needed. Meloxicam  7.5 mg twice a day will be prescribed if she cannot tolerate tramadol. Referral to physical therapy is recommended.

## 2010-08-09 NOTE — Op Note (Signed)
NAME:  Tammy Beard, Tammy Beard              ACCOUNT NO.:  [id number]   MEDICAL RECORD NO.:  [id number]          PATIENT TYPE:  AMB   LOCATION:  [facility]                         FACILITY:  [facility]   PHYSICIAN:  Alfonse Ras, MD   DATE OF BIRTH:  12-27-1940   DATE OF PROCEDURE:  12/17/2005  DATE OF DISCHARGE:                                 OPERATIVE REPORT   PREOPERATIVE DIAGNOSIS:  Malignant melanoma of the left forearm.   POSTOPERATIVE DIAGNOSIS:  Malignant melanoma of the left forearm.   PROCEDURE:  Wide excision of malignant melanoma of the left forearm.   SURGEON:  Alfonse Ras, MD   ANESTHESIA:  Local MAC.   DESCRIPTION:  Patient was taken to the operating room and placed in a supine  position.  After adequate MAC anesthesia was induced using endotracheal  tube, her left forearm was prepped and draped in a normal sterile fashion.  Using an elliptical incision down to the subcutaneous fat and muscular  fascia, all tissue excising with at least 5 mm margin and the periphery was  excised.  This was performed and blocked.  The distal margin was marked with  a nylon suture.  Adequate hemostasis was assured, and the skin was closed  with a subcuticular 3-0 Monocryl.  Steri-Strips and sterile dressings were  applied.  Patient tolerated the procedure well and went to PACU in good  condition.      Alfonse Ras, MD  Electronically Signed     KRE/MEDQ  D:  12/17/2005  T:  12/18/2005  Job:  [id number]   cc:   Dollene Cleveland, M.D.  Fax: [phone]

## 2010-08-09 NOTE — Op Note (Signed)
[facility]  Patient:    Tammy Beard, Tammy Beard                     MRN: [id number] Proc. Date: 01/14/00 Adm. Date:  [id number] Attending:  Rosalee Kaufman                           Operative Report  PREOPERATIVE DIAGNOSIS:  Postmenopausal bleeding on hormone replacement therapy.  POSTOPERATIVE DIAGNOSIS:  Postmenopausal bleeding on hormone replacement therapy.  PROCEDURE:  Dilatation and curettage.  SURGEON:  Harl Bowie, M.D.  ANESTHESIA:  MAC with local.  FINDINGS AND PROCEDURE:  The patient was prepped and draped in the usual fashion for a vaginal procedure.  A speculum was placed in the vagina, and the cervix was grasped with a single-tooth tenaculum.  The cervix was sounded to 3 inches.  The cervix was dilated and the cavity entered with a sharp curette. Only a small amount of tissue was obtained.  No additional tissue was obtained with the Randall stone forceps.  The blood loss was minimal.  The patient tolerated the procedure well and was sent to the recovery room in good condition. DD:  01/14/00 TD:  01/14/00 Job: [id number]

## 2010-08-23 ENCOUNTER — Encounter: Payer: Self-pay | Admitting: Internal Medicine

## 2010-08-23 ENCOUNTER — Ambulatory Visit (INDEPENDENT_AMBULATORY_CARE_PROVIDER_SITE_OTHER): Payer: Medicare Other | Admitting: Internal Medicine

## 2010-08-23 VITALS — BP 116/70 | HR 72 | Temp 98.2°F | Wt 114.0 lb

## 2010-08-23 DIAGNOSIS — N390 Urinary tract infection, site not specified: Secondary | ICD-10-CM

## 2010-08-23 DIAGNOSIS — K5190 Ulcerative colitis, unspecified, without complications: Secondary | ICD-10-CM

## 2010-08-23 DIAGNOSIS — R1012 Left upper quadrant pain: Secondary | ICD-10-CM

## 2010-08-23 LAB — POCT URINALYSIS DIPSTICK
Bilirubin, UA: NEGATIVE
Glucose, UA: NEGATIVE
Ketones, UA: NEGATIVE
Spec Grav, UA: 1.03
Urobilinogen, UA: 0.2

## 2010-08-23 LAB — CBC WITH DIFFERENTIAL/PLATELET
Eosinophils Relative: 1 % (ref 0–5)
HCT: 36.5 % (ref 36.0–46.0)
Lymphocytes Relative: 17 % (ref 12–46)
Lymphs Abs: 1 10*3/uL (ref 0.7–4.0)
MCV: 91.3 fL (ref 78.0–100.0)
Platelets: 246 10*3/uL (ref 150–400)
RBC: 4 MIL/uL (ref 3.87–5.11)
WBC: 6.3 10*3/uL (ref 4.0–10.5)

## 2010-08-23 MED ORDER — RANITIDINE HCL 150 MG PO TABS: 150.0000 mg | ORAL_TABLET | Freq: Two times a day (BID) | ORAL | Status: DC

## 2010-08-23 NOTE — Progress Notes (Signed)
  Subjective:    Patient ID: Tammy Beard, female    DOB: 1940-12-18, 70 y.o.   MRN: [id number]  HPI ABDOMINAL PAIN  Location: LUO  Onset:  9 days ago  Radiation: occasionally to L flank  Severity: up to a 5 Quality: sharp , it can awaken her      Duration: hours Better with: Tylenol ES Worse with: no exacerbating factor Symptoms Nausea/Vomiting: no  Diarrhea: no  Constipation: no  Melena/BRBPR: no  Hematemesis: no  Anorexia: yes  Fever/Chills: yes, temp up to ?  99   Dysuria/hematuria/pyuria: no  Rash: no  Wt loss: yes, 4#  NSAIDs/ASA: no  Vaginal bleeding: no    Past Surgeries: Colitis 2005 on Colonoscopy ; no upper Endo to date       Review of Systems Intermittent hoarseness from PNDrainage. No dysphagia.     Objective:   Physical Exam  Thin but  good health and nourishment w/o distress.  Eyes: No conjunctival inflammation or scleral icterus is present.Eyes prominent w/o lid lag  Oral exam: Dental hygiene is good; lips and gums are healthy appearing.There is no oropharyngeal erythema or exudate noted. Tongue moist  Heart:  Normal rate and regular rhythm. S1 and S2 normal without gallop,  click, rub or other extra sounds . Grade 1/6 systolic murmur    Lungs:Chest clear to auscultation; no wheezes, rhonchi,rales ,or rubs present.No increased work of breathing.   Abdomen: bowel sounds slightly hyperactive ; abdomen soft ; slightly tender LUQ  without masses, organomegaly or hernias noted.  No guarding or rebound   Skin:Warm & dry.  Intact without suspicious lesions or rashes ; no jaundice or tenting  Lymphatic: No lymphadenopathy is noted about the head, neck, or axilla.            Assessment & Plan:  #1 left upper quadrant pain with intermittent radiation to the left flank  #2 ulcerative colitis, post medical history  Plan: Trial of ranitidine 150 mg twice a day pending labs and stool cards.

## 2010-08-23 NOTE — Progress Notes (Signed)
Addended by: Candie Echevaria L on: 08/23/2010 03:10 PM   Modules accepted: Orders

## 2010-08-23 NOTE — Patient Instructions (Signed)
The triggers for dyspepsia or "heart burn"  include stress; the "aspirin family" ; alcohol; peppermint; and caffeine (coffee, tea, cola, and chocolate). The aspirin family would include aspirin and the nonsteroidal agents such as ibuprofen &  Naproxen. Tylenol would not cause reflux. If having dyspepsia ; food & dink should be avoided for @ least 2 hors before going to bed. Complete stool cards. Make appt with Dr Arlyce Dice.

## 2010-08-25 LAB — URINE CULTURE

## 2010-08-27 ENCOUNTER — Other Ambulatory Visit: Payer: Self-pay | Admitting: Gastroenterology

## 2010-08-27 ENCOUNTER — Telehealth: Payer: Self-pay

## 2010-08-27 MED ORDER — NITROFURANTOIN MONOHYD MACRO 100 MG PO CAPS: 100.0000 mg | ORAL_CAPSULE | Freq: Two times a day (BID) | ORAL | Status: DC

## 2010-08-27 NOTE — Telephone Encounter (Signed)
Left message on patient's voicemail informing her urine culture indicated UTI and rx sent to local pharmacy on file, patient to call if questions or concerns

## 2010-08-27 NOTE — Telephone Encounter (Signed)
Message copied by Edgardo Roys on Tue Aug 27, 2010  5:37 PM ------      Message from: Pecola Lawless      Created: Tue Aug 27, 2010  2:34 PM       Macrobid 100 mg bid #20 for UTI

## 2010-08-29 ENCOUNTER — Other Ambulatory Visit: Payer: Self-pay | Admitting: Internal Medicine

## 2010-09-04 ENCOUNTER — Other Ambulatory Visit (INDEPENDENT_AMBULATORY_CARE_PROVIDER_SITE_OTHER): Payer: Medicare Other

## 2010-09-04 DIAGNOSIS — Z1211 Encounter for screening for malignant neoplasm of colon: Secondary | ICD-10-CM

## 2010-09-04 LAB — HEMOCCULT GUIAC POC 1CARD (OFFICE)
Card #3 Fecal Occult Blood, POC: NEGATIVE
Fecal Occult Blood, POC: NEGATIVE

## 2010-10-04 ENCOUNTER — Ambulatory Visit (INDEPENDENT_AMBULATORY_CARE_PROVIDER_SITE_OTHER): Payer: Medicare Other | Admitting: Gastroenterology

## 2010-10-04 ENCOUNTER — Encounter: Payer: Self-pay | Admitting: Gastroenterology

## 2010-10-04 VITALS — BP 114/58 | HR 76 | Ht 61.0 in | Wt 114.4 lb

## 2010-10-04 DIAGNOSIS — K5190 Ulcerative colitis, unspecified, without complications: Secondary | ICD-10-CM

## 2010-10-04 DIAGNOSIS — R011 Cardiac murmur, unspecified: Secondary | ICD-10-CM

## 2010-10-04 MED ORDER — MESALAMINE 400 MG PO TBEC: DELAYED_RELEASE_TABLET | ORAL | Status: DC

## 2010-10-04 MED ORDER — RANITIDINE HCL 150 MG PO TABS: 150.0000 mg | ORAL_TABLET | Freq: Two times a day (BID) | ORAL | Status: DC

## 2010-10-04 NOTE — Progress Notes (Signed)
History of Present Illness:  Tammy Beard has returned for her annual visit for left-sided colitis. On asacol she has remained in clinical remission. She has no GI complaints including abdominal pain, change in bowel habits, melena or hematochezia.    Review of Systems: Pertinent positive and negative review of systems were noted in the above HPI section. All other review of systems were otherwise negative.    Current Medications, Allergies, Past Medical History, Past Surgical History, Family History and Social History were reviewed in [employer] electronic medical record  Vital signs were reviewed in today's medical record. Physical Exam: General: Well developed , well nourished, no acute distress Head: Normocephalic and atraumatic Eyes:  sclerae anicteric, EOMI Ears: Normal auditory acuity Mouth: No deformity or lesions Lungs: Clear throughout to auscultation Heart: Regular rate and rhythm; again noted is a 2-3/6 early systolic murmur heard throughout the precordium  Abdomen: Soft, non tender and non distended. No masses, hepatosplenomegaly or hernias noted. Normal Bowel sounds Rectal:deferred Musculoskeletal: Symmetrical with no gross deformities  Pulses:  Normal pulses noted Extremities: No clubbing, cyanosis, edema or deformities noted Neurological: Alert oriented x 4, grossly nonfocal Psychological:  Alert and cooperative. Normal mood and affect

## 2010-10-04 NOTE — Patient Instructions (Signed)
Your Echocardiogram is scheduled at [facility] on 10/10/2010 at 1pm

## 2010-10-04 NOTE — Assessment & Plan Note (Signed)
Continue current regimen

## 2010-10-04 NOTE — Assessment & Plan Note (Signed)
Plan echocardiogram

## 2010-10-10 ENCOUNTER — Ambulatory Visit (HOSPITAL_COMMUNITY): Payer: Medicare Other | Attending: Gastroenterology | Admitting: Radiology

## 2010-10-10 DIAGNOSIS — I319 Disease of pericardium, unspecified: Secondary | ICD-10-CM | POA: Insufficient documentation

## 2010-10-10 DIAGNOSIS — R011 Cardiac murmur, unspecified: Secondary | ICD-10-CM

## 2010-10-10 DIAGNOSIS — K5190 Ulcerative colitis, unspecified, without complications: Secondary | ICD-10-CM

## 2010-10-10 DIAGNOSIS — I059 Rheumatic mitral valve disease, unspecified: Secondary | ICD-10-CM | POA: Insufficient documentation

## 2010-10-23 ENCOUNTER — Encounter: Payer: Self-pay | Admitting: Gastroenterology

## 2010-11-07 ENCOUNTER — Other Ambulatory Visit: Payer: Self-pay | Admitting: Internal Medicine

## 2010-11-07 DIAGNOSIS — Z1231 Encounter for screening mammogram for malignant neoplasm of breast: Secondary | ICD-10-CM

## 2010-12-03 ENCOUNTER — Ambulatory Visit (HOSPITAL_COMMUNITY)
Admission: RE | Admit: 2010-12-03 | Discharge: 2010-12-03 | Disposition: A | Discharge status: Home / Self Care | Payer: Medicare Other | Source: Ambulatory Visit | Attending: Internal Medicine | Admitting: Internal Medicine

## 2010-12-03 DIAGNOSIS — Z1231 Encounter for screening mammogram for malignant neoplasm of breast: Secondary | ICD-10-CM | POA: Insufficient documentation

## 2011-01-13 ENCOUNTER — Encounter: Payer: Self-pay | Admitting: Internal Medicine

## 2011-01-13 ENCOUNTER — Ambulatory Visit (INDEPENDENT_AMBULATORY_CARE_PROVIDER_SITE_OTHER): Payer: Medicare Other | Admitting: Internal Medicine

## 2011-01-13 DIAGNOSIS — J0100 Acute maxillary sinusitis, unspecified: Secondary | ICD-10-CM

## 2011-01-13 DIAGNOSIS — L02818 Cutaneous abscess of other sites: Secondary | ICD-10-CM

## 2011-01-13 MED ORDER — FLUTICASONE PROPIONATE 50 MCG/ACT NA SUSP: 1.0000 | NASAL | Status: DC

## 2011-01-13 MED ORDER — AMOXICILLIN-POT CLAVULANATE 500-125 MG PO TABS: 1.0000 | ORAL_TABLET | Freq: Two times a day (BID) | ORAL | Status: AC

## 2011-01-13 MED ORDER — MUPIROCIN 2 % EX OINT: TOPICAL_OINTMENT | CUTANEOUS | Status: AC

## 2011-01-13 NOTE — Progress Notes (Signed)
  Subjective:    Patient ID: Tammy Beard, female    DOB: April 27, 1940, 70 y.o.   MRN: [id number]  HPI Respiratory tract infection Onset/symptoms:10/15 as watery eyes, rhinitis & sneezing Exposures (illness/environmental/extrinsic):no Progression of symptoms:soreness inside nose Treatments/response:Tylenol, Zyrtec, saline tears Present symptoms: Fever/chills/sweats:temp to 99+ Frontal headache:no Facial pain:L maxillary area Nasal purulence:not observed Sore throat:no Dental pain:yes , L maxilla Lymphadenopathy:no Wheezing/shortness of breath:no Cough/sputum/hemoptysis:no  Past medical history: Seasonal allergies: yes/asthma:no Smoking history:never           Review of Systems she traumatized the right earlobe trying to put  her ear ring in on 10/13     Objective:   Physical Exam Thin but good nourishment; no acute distress or increased work of breathing is present.  No  lymphadenopathy about the head, neck, or axilla noted.   Eyes: No conjunctival inflammation or lid edema is present. EOMI. Ptosis OS.Vision decreased despite lenses  Ears: She is wearing hearing aids bilaterally. There appears to be  possible  Abscess or granuloma over the inferior right ear lobe with eschar .  Nose:  External nasal examination shows no deformity or inflammation. Nasal mucosa are pink and moist without lesions or exudates. No septal dislocation.No obstruction to airflow.   Oral exam: Dental hygiene is good; lips and gums are healthy appearing.There is no oropharyngeal erythema or exudate noted.   Neck:  No deformities, thyromegaly, masses, or tenderness noted.   Supple with full range of motion without pain.   Heart:  Normal rate and regular rhythm. S1 and S2 normal without gallop,  click, rub or other extra sounds.. She has a grade 1-1.5 systolic murmur  Lungs:Chest clear to auscultation; no wheezes, rhonchi,rales ,or rubs present.No increased work of breathing.    Extremities:  No  cyanosis, edema, or clubbing  noted    Skin: Warm & dry           Assessment & Plan:  #1 left maxillary sinusitis. Her initial symptoms clinically suggest extrinsic rhinitis/conjunctivitis.  #2 possible low-grade abscess right earlobe  #3 amoxicillin taken every 3 months as SBE prophylaxis  Plan: See orders

## 2011-01-13 NOTE — Patient Instructions (Addendum)
Use warm moist compresses to 3 times a day to the affected area.   Your last tetanus booster was in May 2011;it  is up to date

## 2011-01-28 ENCOUNTER — Telehealth: Payer: Self-pay | Admitting: Internal Medicine

## 2011-01-28 DIAGNOSIS — J0100 Acute maxillary sinusitis, unspecified: Secondary | ICD-10-CM

## 2011-01-28 MED ORDER — METRONIDAZOLE 500 MG PO TABS: 500.0000 mg | ORAL_TABLET | Freq: Three times a day (TID) | ORAL | Status: AC

## 2011-01-28 NOTE — Telephone Encounter (Signed)
Discuss with patient, Rx sent to pharmacy. 

## 2011-01-28 NOTE — Telephone Encounter (Signed)
Metronidazole 500 mg 3 times a day #21. Fluticasone ;1 spray in each nostril twice a day as needed. Use the "crossover" technique as discussed(use R hand to place nozzle  into  L nostril & L hand into R ; point applicator toward ear, not straight up)

## 2011-01-28 NOTE — Telephone Encounter (Signed)
Patient was ween [id number] she is still congested especially at night wants to know if something can be called in - [facility] [address]

## 2011-03-22 ENCOUNTER — Other Ambulatory Visit: Payer: Self-pay | Admitting: Dermatology

## 2011-07-16 ENCOUNTER — Ambulatory Visit (INDEPENDENT_AMBULATORY_CARE_PROVIDER_SITE_OTHER): Payer: Medicare Other | Admitting: Internal Medicine

## 2011-07-16 ENCOUNTER — Encounter: Payer: Self-pay | Admitting: Internal Medicine

## 2011-07-16 DIAGNOSIS — B029 Zoster without complications: Secondary | ICD-10-CM

## 2011-07-16 MED ORDER — GABAPENTIN 100 MG PO CAPS: ORAL_CAPSULE | ORAL | Status: DC

## 2011-07-16 MED ORDER — VALACYCLOVIR HCL 500 MG PO TABS: ORAL_TABLET | ORAL | Status: DC

## 2011-07-16 NOTE — Patient Instructions (Signed)
Assess response to the gabapentin one every 8 hours as needed. If it is partially beneficial, it can be increased up to a total of 3 pills every 8 hours as needed. This increase of 1 pill each dose  should take place over 72 hours at least. 

## 2011-07-16 NOTE — Progress Notes (Signed)
  Subjective:    Patient ID: Tammy Beard, female    DOB: 1940-10-23, 71 y.o.   MRN: [id number]  HPI 14/22/13 she noted some swelling in her left hand. Over the next 24 hours she noted a rash over the left upper extremity with some sharp pain.  She also noted some "jerking. She denies any fever, chills, or sweats.  She has no past history of shingles and has not had the zoster immunization    Review of Systems  She has been taking extra strength Tylenol with benefit. She does have a history of childhood chickenpox     Objective:   Physical Exam  She is thin but healthy in appearance and in no distress.  She appears younger than her stated age  She has no lymphadenopathy about the neck or axilla  She has a classic zoster rash of the left upper extremity in the C-7 dermatome        Assessment & Plan:  #1 herpes zoster at C.-7  Plan: Pathophysiology of zoster explained. See orders

## 2011-07-29 ENCOUNTER — Telehealth: Payer: Self-pay | Admitting: Gastroenterology

## 2011-07-29 NOTE — Telephone Encounter (Signed)
Pt states she received a letter from her insurance company that she could not take asacol anymore. The letter states she can take either Apriso or Lialda for the same price. Pt currently taking Asacol 400EC 2 pills BID. Pt wants new rx sent to pharmacy for one of the new drugs she can take. Please advise.

## 2011-07-30 MED ORDER — MESALAMINE 1.2 G PO TBEC: 2.4000 g | DELAYED_RELEASE_TABLET | Freq: Every day | ORAL | Status: DC

## 2011-07-30 NOTE — Telephone Encounter (Signed)
Rx sent to pharmacy and pt aware. 

## 2011-07-30 NOTE — Telephone Encounter (Signed)
lialdo 2.4gm qd

## 2011-08-02 ENCOUNTER — Other Ambulatory Visit: Payer: Self-pay | Admitting: Internal Medicine

## 2011-09-03 ENCOUNTER — Other Ambulatory Visit: Payer: Self-pay | Admitting: Internal Medicine

## 2011-09-03 NOTE — Telephone Encounter (Signed)
Done

## 2011-11-05 ENCOUNTER — Other Ambulatory Visit: Payer: Self-pay | Admitting: Internal Medicine

## 2011-11-05 DIAGNOSIS — Z1231 Encounter for screening mammogram for malignant neoplasm of breast: Secondary | ICD-10-CM

## 2011-11-14 DIAGNOSIS — H2510 Age-related nuclear cataract, unspecified eye: Secondary | ICD-10-CM | POA: Diagnosis not present

## 2011-11-14 DIAGNOSIS — Z961 Presence of intraocular lens: Secondary | ICD-10-CM | POA: Diagnosis not present

## 2011-11-14 DIAGNOSIS — H179 Unspecified corneal scar and opacity: Secondary | ICD-10-CM | POA: Diagnosis not present

## 2011-11-29 ENCOUNTER — Other Ambulatory Visit: Payer: Self-pay | Admitting: Gastroenterology

## 2011-12-05 ENCOUNTER — Ambulatory Visit (HOSPITAL_COMMUNITY)
Admission: RE | Admit: 2011-12-05 | Discharge: 2011-12-05 | Disposition: A | Discharge status: Home / Self Care | Payer: Medicare Other | Source: Ambulatory Visit | Attending: Internal Medicine | Admitting: Internal Medicine

## 2011-12-05 DIAGNOSIS — Z1231 Encounter for screening mammogram for malignant neoplasm of breast: Secondary | ICD-10-CM | POA: Diagnosis not present

## 2011-12-16 ENCOUNTER — Ambulatory Visit (INDEPENDENT_AMBULATORY_CARE_PROVIDER_SITE_OTHER): Payer: Medicare Other | Admitting: Family Medicine

## 2011-12-16 VITALS — BP 112/58 | HR 80 | Temp 97.9°F | Resp 16 | Ht 63.0 in | Wt 117.0 lb

## 2011-12-16 DIAGNOSIS — L02519 Cutaneous abscess of unspecified hand: Secondary | ICD-10-CM | POA: Diagnosis not present

## 2011-12-16 DIAGNOSIS — S61409A Unspecified open wound of unspecified hand, initial encounter: Secondary | ICD-10-CM

## 2011-12-16 DIAGNOSIS — L03119 Cellulitis of unspecified part of limb: Secondary | ICD-10-CM

## 2011-12-16 MED ORDER — CEPHALEXIN 500 MG PO CAPS: 500.0000 mg | ORAL_CAPSULE | Freq: Three times a day (TID) | ORAL | Status: DC

## 2011-12-16 NOTE — Progress Notes (Signed)
[facility] [address] [place] [address] [phone]  Date:  12/16/2011   Name:  Tammy Beard   DOB:  [birth year]   MRN:  [id number]  PCP:  Marga Melnick, MD    Chief Complaint: Insect Bite   History of Present Illness:  Tammy Beard is a 71 y.o. very pleasant female patient who presents with the following:  She noted a "little place" on her right hand a few days ago.  It was sore and red, and then started to weep.  She is not aware of any particular injury, but she showed it to the nurse at [facility] who was concerned about an infection.    Otherwise she feels fine, and does not have any other areas like this.  The area is not that sore.    Patient Active Problem List  Diagnosis  . HYPERLIPIDEMIA  . HYPERTENSION  . ALLERGIC RHINITIS  . ULCERATIVE COLITIS  . CONTACT DERMATITIS  . BACK PAIN, THORACIC REGION  . BACK PAIN, LUMBAR  . OSTEOPENIA  . SLEEP DISORDER  . HOARSENESS  . MURMUR  . CHEST PAIN  . URINARY FREQUENCY  . FASTING HYPERGLYCEMIA  . OPEN WOUND OF FOREARM, COMPLICATED    Past Medical History  Diagnosis Date  . Ulcerative colitis   . Osteopenia     s/p 7 yrs Fosamax  . Hypertension   . Hyperlipidemia   . Sleep disorder   . GERD (gastroesophageal reflux disease)   . Heart murmur   . Cancer     melanoma    Past Surgical History  Procedure Date  . Breast lumpectomy 1986    left  . Neck surgery 1986    Benign growth on neck removed  . Dilation and curettage of uterus 12/1999  . Colonoscopy 2006  . Tonsillectomy   . Melanoma excision 2007    left arm  . Cataract extraction     Left eye     History  Substance Use Topics  . Smoking status: Never Smoker   . Smokeless tobacco: Not on file  . Alcohol Use: Yes     occasional    Family History  Problem Relation Age of Onset  . Diabetes Paternal Grandmother   . Cancer Paternal Aunt     unaware of primary site  . Other Mother     Coad/ Bronchiectosis   . Coronary artery disease Neg Hx     No Known Allergies  Medication list has been reviewed and updated.  Current Outpatient Prescriptions on File Prior to Visit  Medication Sig Dispense Refill  . amoxicillin (AMOXIL) 500 MG capsule Take 500 mg by mouth. Prior to dental procedure       . benazepril-hydrochlorthiazide (LOTENSIN HCT) 20-12.5 MG per tablet take 1/2 tablet by mouth every morning  45 tablet  3  . Calcium Carbonate-Vitamin D (CALTRATE 600+D) 600-400 MG-UNIT per tablet Take 1 tablet by mouth 2 (two) times daily.        . cetirizine (ZYRTEC) 10 MG tablet Take 10 mg by mouth as needed.        . Cholecalciferol (VITAMIN D) 2000 UNITS CAPS Take 2,000 Units by mouth.        . fluticasone (FLONASE) 50 MCG/ACT nasal spray Place 1 spray into the nose as directed. 1 spray in each nostril twice a day as needed. Use the "crossover" technique as discussed   16 g  2  . gabapentin (NEURONTIN) 100 MG  capsule take 1 capsule by mouth every 8 hours if needed for arm pain  30 capsule  0  . LIALDA 1.2 G EC tablet take 2 tablets by mouth daily with BREAKFAST  60 tablet  3  . Multiple Vitamin (MULTIVITAMIN) tablet Take 1 tablet by mouth daily.        . valACYclovir (VALTREX) 500 MG tablet 1pill 3 times a day  21 tablet  0  . vitamin E 400 UNIT capsule Take 400 Units by mouth daily.        . mesalamine (ASACOL) 400 MG EC tablet Take 2 tabs daily  180 tablet  4    Review of Systems:  As per HPI- otherwise negative.   Physical Examination: Filed Vitals:   12/16/11 1217  BP: 112/58  Pulse: 80  Temp: 97.9 F (36.6 C)  Resp: 16   Filed Vitals:   12/16/11 1217  Height: 5\' 3"  (1.6 m)  Weight: 117 lb (53.071 kg)   Body mass index is 20.73 kg/(m^2). Ideal Body Weight: Weight in (lb) to have BMI = 25: 140.8   GEN: WDWN, NAD, Non-toxic, A & O x 3, slim build HEENT: Atraumatic, Normocephalic. Neck supple. No masses, No LAD. Ears and Nose: No external deformity. CV: RRR, No M/G/R. No JVD. No  thrill. No extra heart sounds. PULM: CTA B, no wheezes, crackles, rhonchi. No retractions. No resp. distress. No accessory muscle use. Right hand: on the dorsal side of the hand over the 2nd [facility] there is a weeping, red ringed wound.  It is not swollen.  The hand is otherwise not tender, and she has normal strength and sensation of the hand.  No other wounds  EXTR: No c/c/e NEURO Normal gait.  PSYCH: Normally interactive. Conversant. Not depressed or anxious appearing.  Calm demeanor.    Assessment and Plan: 1. Cellulitis of hand  cephALEXin (KEFLEX) 500 MG capsule  2. Open wound, hand     Will treat with keflex for a wound on her hand- could have started with an insect bite or other small insult to the skin that got infected.  Keep it covered, and let me know if not better in the next 2 days- Sooner if worse.   Meds ordered this encounter  Medications  . cephALEXin (KEFLEX) 500 MG capsule    Sig: Take 1 capsule (500 mg total) by mouth 3 (three) times daily.    Dispense:  30 capsule    Refill:  0     Delight Bickle, MD

## 2011-12-22 ENCOUNTER — Emergency Department (HOSPITAL_COMMUNITY)
Admission: EM | Admit: 2011-12-22 | Discharge: 2011-12-22 | Disposition: A | Discharge status: Home / Self Care | Payer: Medicare Other | Attending: Emergency Medicine | Admitting: Emergency Medicine

## 2011-12-22 ENCOUNTER — Emergency Department (HOSPITAL_COMMUNITY): Payer: Medicare Other

## 2011-12-22 ENCOUNTER — Encounter (HOSPITAL_COMMUNITY): Payer: Self-pay | Admitting: *Deleted

## 2011-12-22 DIAGNOSIS — S59919A Unspecified injury of unspecified forearm, initial encounter: Secondary | ICD-10-CM | POA: Diagnosis not present

## 2011-12-22 DIAGNOSIS — S52599A Other fractures of lower end of unspecified radius, initial encounter for closed fracture: Secondary | ICD-10-CM | POA: Insufficient documentation

## 2011-12-22 DIAGNOSIS — IMO0002 Reserved for concepts with insufficient information to code with codable children: Secondary | ICD-10-CM | POA: Insufficient documentation

## 2011-12-22 DIAGNOSIS — S52502A Unspecified fracture of the lower end of left radius, initial encounter for closed fracture: Secondary | ICD-10-CM

## 2011-12-22 DIAGNOSIS — S6990XA Unspecified injury of unspecified wrist, hand and finger(s), initial encounter: Secondary | ICD-10-CM | POA: Diagnosis not present

## 2011-12-22 DIAGNOSIS — W010XXA Fall on same level from slipping, tripping and stumbling without subsequent striking against object, initial encounter: Secondary | ICD-10-CM | POA: Insufficient documentation

## 2011-12-22 DIAGNOSIS — I10 Essential (primary) hypertension: Secondary | ICD-10-CM | POA: Insufficient documentation

## 2011-12-22 MED ORDER — HYDROCODONE-ACETAMINOPHEN 5-325 MG PO TABS: 1.0000 | ORAL_TABLET | ORAL | Status: DC | PRN

## 2011-12-22 MED ORDER — TETANUS-DIPHTH-ACELL PERTUSSIS 5-2.5-18.5 LF-MCG/0.5 IM SUSP
0.5000 mL | Freq: Once | INTRAMUSCULAR | Status: AC
  Administered 2011-12-22: 0.5 mL via INTRAMUSCULAR
  Filled 2011-12-22: qty 0.5

## 2011-12-22 NOTE — ED Notes (Signed)
Pt states was walking yesterday w/ her dad and tripped and fell down to ground catching herself with her L wrist, also states his L knee. Pt complaining of L wrist pain, also states having some pain in L knee and ankle but not as bad as wrist. Pt able to move all extremities, L wrist swollen.

## 2011-12-22 NOTE — ED Notes (Signed)
Ortho tech called 

## 2011-12-22 NOTE — ED Provider Notes (Signed)
Patient has a minimally displaced left wrist distal radius fracture, she fell yesterday while walking with her father and sustained a Fu she injury. Patient's pain is directly over her wrist, she denies any numbness or tingling of her fingers.   mild swelling to the distal left radius, mild bruising to the skin above distal left radius, skin intact. Fingers appear normal. No TTP over flexor sheath. Radial/Ulnar arteries 2+ with <2sec cap refill. SILT in M/U/R distributions.   Grip 5/5 strength. MCP flexion/extension intact. Finger adduction/abduction intact with 5/5 strength.  Thumb opposition intact.    patient can pronate and supinate the arm as well as having minimal wrist flexion extension, she is told to keep her wrist stationary.  Range of motion at  MCP, PIP and DIP are normal  FDS/FDP intact.   Patient has been splinted, she can follow up with orthopedics, Dr. Melvyn Novas - followup information be given to her.  I explained the diagnosis and have given explicit precautions to return to the ER including numbness or tingling in the left hand, coldness in the left fingers, paresthesias or weakness in the left hand or any other new or worsening symptoms. The patient understands and accepts the medical plan as it's been dictated and I have answered their questions. Discharge instructions concerning home care and prescriptions have been given.  The patient is STABLE and is discharged to home in good condition.     Jones Skene, MD 12/22/11 1755

## 2011-12-22 NOTE — ED Provider Notes (Signed)
History     CSN: [id number]  Arrival date & time 12/22/11  1233   First MD Initiated Contact with Patient 12/22/11 1320      No chief complaint on file.   (Consider location/radiation/quality/duration/timing/severity/associated sxs/prior treatment) HPI Patient tripped and fell yesterday injuring her left wrist and left knee. She denies any pain presently. No treatment prior to coming here. She suffered an abrasion to her left anterior knee as result of the fall. Nothing makes symptoms better or worse she is presently asymptomatic except for swelling at left wrist. Denies syncope she denies lightheadedness with standing denies other complaint no other associated symptoms Past Medical History  Diagnosis Date  . Ulcerative colitis   . Osteopenia     s/p 7 yrs Fosamax  . Hypertension   . Hyperlipidemia   . Sleep disorder   . GERD (gastroesophageal reflux disease)   . Heart murmur   . Cancer     melanoma    Past Surgical History  Procedure Date  . Breast lumpectomy 1986    left  . Neck surgery 1986    Benign growth on neck removed  . Dilation and curettage of uterus 12/1999  . Colonoscopy 2006  . Tonsillectomy   . Melanoma excision 2007    left arm  . Cataract extraction     Left eye     Family History  Problem Relation Age of Onset  . Diabetes Paternal Grandmother   . Cancer Paternal Aunt     unaware of primary site  . Other Mother     Coad/ Bronchiectosis  . Coronary artery disease Neg Hx     History  Substance Use Topics  . Smoking status: Never Smoker   . Smokeless tobacco: Not on file  . Alcohol Use: Yes     occasional    OB History    Grav Para Term Preterm Abortions TAB SAB Ect Mult Living                  Review of Systems  Constitutional: Negative.   HENT: Negative.   Respiratory: Negative.   Cardiovascular: Negative.   Gastrointestinal: Negative.   Musculoskeletal: Positive for arthralgias.  Skin: Positive for wound.       Abrasion to  left knee, wound to right hand, healing from several days ago  Neurological: Negative.   Hematological: Negative.   Psychiatric/Behavioral: Negative.   All other systems reviewed and are negative.    Allergies  Review of patient's allergies indicates no known allergies.  Home Medications   Current Outpatient Rx  Name Route Sig Dispense Refill  . AMOXICILLIN 500 MG PO CAPS Oral Take 2,000 mg by mouth once as needed. Prior to dental procedure    . BENAZEPRIL-HYDROCHLOROTHIAZIDE 20-12.5 MG PO TABS  take 1/2 tablet by mouth every morning 45 tablet 3  . CALCIUM CARBONATE-VITAMIN D 600-400 MG-UNIT PO TABS Oral Take 1 tablet by mouth 2 (two) times daily.      . CEPHALEXIN 500 MG PO CAPS Oral Take 500 mg by mouth 3 (three) times daily. For 10 days.    Marland Kitchen CETIRIZINE HCL 10 MG PO TABS Oral Take 10 mg by mouth as needed. Allergies.    Marland Kitchen VITAMIN D 2000 UNITS PO CAPS Oral Take 2,000 Units by mouth daily.     Marland Kitchen FLUTICASONE PROPIONATE 50 MCG/ACT NA SUSP Nasal Place 1 spray into the nose as directed. 1 spray in each nostril twice a day as needed. Use the "crossover" technique  as discussed  16 g 2  . LIALDA 1.2 G PO TBEC  take 2 tablets by mouth daily with BREAKFAST 60 tablet 3  . ONE-DAILY MULTI VITAMINS PO TABS Oral Take 1 tablet by mouth daily.      Marland Kitchen VITAMIN E 400 UNITS PO CAPS Oral Take 400 Units by mouth daily.        BP 96/52  Pulse 91  Temp 98.9 F (37.2 C) (Oral)  Resp 18  SpO2 98%  Physical Exam  Nursing note and vitals reviewed. Constitutional: She is oriented to person, place, and time. She appears well-developed and well-nourished.  HENT:  Head: Normocephalic and atraumatic.  Eyes: Conjunctivae normal are normal. Pupils are equal, round, and reactive to light.  Neck: Neck supple. No tracheal deviation present. No thyromegaly present.  Cardiovascular: Normal rate and regular rhythm.   No murmur heard. Pulmonary/Chest: Effort normal and breath sounds normal.  Abdominal: Soft.  Bowel sounds are normal. She exhibits no distension. There is no tenderness.  Musculoskeletal: Normal range of motion. She exhibits no edema and no tenderness.       Left upper extremity swollen at wrist and distal forearm, tender at the dorsal wrist, no forearm tenderness and no anatomic snuffbox tenderness radial pulse 2+ left lower extremity dime-sized abrasion at anterior knee no swelling no deformity no tenderness right upper extremity there is a 0.5 cm circular wound at the web space between thumb and forefinger which appears to be well healing. No tenderness left lower extremity right lower extremity and right upper extremity without deformity or tenderness or swelling, neurovascularly intact  Neurological: She is alert and oriented to person, place, and time. Coordination normal.       Gait normal, not lightheaded on standing  Skin: Skin is warm and dry. No rash noted.  Psychiatric: She has a normal mood and affect.    ED Course  Procedures (including critical care time) Declined pain medicine in the emergency department Labs Reviewed - No data to display No results found. Results for orders placed in visit on 01/13/11  HM MAMMOGRAPHY      Component Value Range   HM Mammogram negative    HM COLONOSCOPY      Component Value Range   HM Colonoscopy done     Dg Wrist Complete Left  12/22/2011  *RADIOLOGY REPORT*  Clinical Data: Fall left wrist pain  LEFT WRIST - COMPLETE 3+ VIEW  Comparison: None.  Findings:  The bones are diffusely osteopenic.  There is an acute fracture of the distal radius, seen along its radial and dorsal aspects, where there is some impaction and buckling of the cortex. There is slight dorsal displacement of the distal fracture fragment, as seen on the lateral view.  The distal ulna is intact.  The carpal rows are maintained.  No acute carpal fracture is identified.  Soft tissue swelling is seen about the wrist.  IMPRESSION:  1.  Acute mildly impacted distal radius  fracture, with slight dorsal displacement of the distal fracture fragment . There is adjacent soft tissue swelling. 2.  The remainder of the imaged bones are intact.   Original Report Authenticated By: Britta Mccreedy, M.D.    Mm Digital Screening  12/08/2011  *RADIOLOGY REPORT*  Clinical Data: Screening.  DIGITAL BILATERAL SCREENING MAMMOGRAM WITH CAD  Comparison:  Previous exams.  Findings:  There are scattered fibroglandular densities. No suspicious masses, architectural distortion, or calcifications are present.  Images were processed with CAD.  IMPRESSION: No mammographic  evidence of malignancy.  A result letter of this screening mammogram will be mailed directly to the patient.  RECOMMENDATION: Screening mammogram in one year. (Code:SM-B-01Y)  BI-RADS CATEGORY 1:  Negative.   Original Report Authenticated By: Otilio Carpen, M.D.     X-ray reviewed by me No diagnosis found.  3 PM Patient was signed out to Dr. Rulon Abide arranged for disposition and followup  MDM  Patient will require outpatient followup, splinting and possibly analgesia Diagnosis #1 closed fracture of left distal radius #2 fall      Doug Sou, MD 12/22/11 1649

## 2011-12-26 DIAGNOSIS — S52599A Other fractures of lower end of unspecified radius, initial encounter for closed fracture: Secondary | ICD-10-CM | POA: Diagnosis not present

## 2012-01-19 DIAGNOSIS — S52599A Other fractures of lower end of unspecified radius, initial encounter for closed fracture: Secondary | ICD-10-CM | POA: Diagnosis not present

## 2012-02-09 DIAGNOSIS — S5290XD Unspecified fracture of unspecified forearm, subsequent encounter for closed fracture with routine healing: Secondary | ICD-10-CM | POA: Diagnosis not present

## 2012-03-04 ENCOUNTER — Encounter: Payer: Self-pay | Admitting: Internal Medicine

## 2012-03-04 ENCOUNTER — Ambulatory Visit (INDEPENDENT_AMBULATORY_CARE_PROVIDER_SITE_OTHER): Payer: Medicare Other | Admitting: Internal Medicine

## 2012-03-04 VITALS — BP 102/58 | HR 100 | Temp 97.9°F | Wt 115.2 lb

## 2012-03-04 DIAGNOSIS — R011 Cardiac murmur, unspecified: Secondary | ICD-10-CM

## 2012-03-04 DIAGNOSIS — J069 Acute upper respiratory infection, unspecified: Secondary | ICD-10-CM

## 2012-03-04 MED ORDER — FLUTICASONE PROPIONATE 50 MCG/ACT NA SUSP: 1.0000 | Freq: Two times a day (BID) | NASAL | Status: DC | PRN

## 2012-03-04 MED ORDER — CEFUROXIME AXETIL 500 MG PO TABS: 500.0000 mg | ORAL_TABLET | Freq: Two times a day (BID) | ORAL | Status: DC

## 2012-03-04 NOTE — Progress Notes (Signed)
  Subjective:    Patient ID: Tammy Beard, female    DOB: [birth year], 71 y.o.   MRN: [id number]  HPI Symptoms began 2-3 weeks ago as some ethmoid and right facial pressure discomfort. This actually improved without significant treatment; it recurred on the left and then finally has settled in both facial sinus areas.  She's had minor sneezing; all nasal secretions are clear. Cough has been insignificant.  She had some sore throat related to "bumps" in her throat which was treated with over-the-counter drops with resolution. She also uses Mucinex D  She did have low-grade fever 2 days ago up to 99    Review of Systems  She denies significant itchy, watery eyes. She said no significant pelvic pain or discharge. There's been no chills or sweats. She also has had no shortness of breath or wheezing.     Objective:   Physical ExamGeneral appearance: thin but in good health ;adequatelyl nourished; no acute distress or increased work of breathing is present.  No  lymphadenopathy about the head, neck, or axilla noted.   Eyes: No conjunctival inflammation or lid edema is present.   Ears:  External ear exam shows no significant lesions or deformities.  Hearing aidsbilaterally Nose:  External nasal examination shows no deformity or inflammation. Nasal mucosa are pink and moist without lesions or exudates. No septal dislocation or deviation.No obstruction to airflow.   Oral exam: Dental hygiene is good; lips and gums are healthy appearing.There is no oropharyngeal erythema or exudate noted.   Neck:  No deformities,  masses, or tenderness noted.   Supple with full range of motion without pain.   Heart:  Normal rate and regular rhythm. S1 and S2 normal without gallop, murmur,  click, rub or other extra sounds. Grade 2/6 systolic murmur   Lungs:Chest clear to auscultation; no wheezes, rhonchi,rales ,or rubs present.No increased work of breathing.    Extremities:  No cyanosis, edema, or clubbing   noted    Skin: Warm & dry .          Assessment & Plan:  #1 probable facial sinusitis; Mucinex D may have altered the clinical picture  #2 outflow murmur with diastolic dysfunction. Mild dilation of the descending aorta. Baseline  evaluation by Cardiology  is indicated. Note:Her last Mucinex D was 1 week ago; decongestant use could possibly impact murmur . Plan: See orders and recommendations

## 2012-03-04 NOTE — Patient Instructions (Addendum)
Plain Mucinex for thick secretions ;force NON dairy fluids . Use a Neti pot daily as needed for sinus congestion; going from open side to congested side . Nasal cleansing in the shower as discussed. Make sure that all residual soap is removed to prevent irritation. Fluticasone 1 spray in each nostril twice a day as needed. Use the "crossover" technique as discussed. Plain Allegra 160 daily as needed for itchy eyes & sneezing.  If you activate My Chart; the results can be released to you as soon as they populate from the lab. If you choose not to use this program; the labs have to be reviewed, copied & mailed   causing a delay in getting the results to you.

## 2012-03-11 DIAGNOSIS — S5290XD Unspecified fracture of unspecified forearm, subsequent encounter for closed fracture with routine healing: Secondary | ICD-10-CM | POA: Diagnosis not present

## 2012-03-26 ENCOUNTER — Encounter: Payer: Self-pay | Admitting: *Deleted

## 2012-03-26 DIAGNOSIS — L821 Other seborrheic keratosis: Secondary | ICD-10-CM | POA: Diagnosis not present

## 2012-03-26 DIAGNOSIS — L820 Inflamed seborrheic keratosis: Secondary | ICD-10-CM | POA: Diagnosis not present

## 2012-03-26 DIAGNOSIS — L723 Sebaceous cyst: Secondary | ICD-10-CM | POA: Diagnosis not present

## 2012-03-26 DIAGNOSIS — Z85828 Personal history of other malignant neoplasm of skin: Secondary | ICD-10-CM | POA: Diagnosis not present

## 2012-03-26 DIAGNOSIS — Z85820 Personal history of malignant melanoma of skin: Secondary | ICD-10-CM | POA: Diagnosis not present

## 2012-04-01 ENCOUNTER — Encounter: Payer: Self-pay | Admitting: Cardiology

## 2012-04-01 ENCOUNTER — Ambulatory Visit (INDEPENDENT_AMBULATORY_CARE_PROVIDER_SITE_OTHER): Payer: Medicare Other | Admitting: Cardiology

## 2012-04-01 VITALS — BP 122/62 | HR 83 | Ht 63.0 in | Wt 117.0 lb

## 2012-04-01 DIAGNOSIS — R011 Cardiac murmur, unspecified: Secondary | ICD-10-CM | POA: Diagnosis not present

## 2012-04-01 NOTE — Patient Instructions (Addendum)
Your physician has requested that you have an echocardiogram. Echocardiography is a painless test that uses sound waves to create images of your heart. It provides your doctor with information about the size and shape of your heart and how well your heart's chambers and valves are working. This procedure takes approximately one hour. There are no restrictions for this procedure.  Your physician recommends that you schedule a follow-up appointment as needed with Dr McLean.   

## 2012-04-01 NOTE — Progress Notes (Signed)
Patient ID: ENDORA TERESI, female   DOB: July 15, 1940, 72 y.o.   MRN: [id number] PCP: Dr. Alwyn Ren  72 yo with history of rheumatic fever since childhood and heart murmur presents for evaluation of the murmur.  She says that she has been told that she has a murmur for as long as she can remember.  She had an echo done in 7/12 with no significant valvular abnormalities.  There was focal basal septal hypertrophy with narrowed LV outflow tract and a turbulent jet but no significant LV outflow tract gradient was measured.   Ms Kimbell has been symptomatically stable.  No exertional dyspnea but she is not very active.  She does not walk up stairs at all.  No chest pain.  No history of lightheadedness or syncope.  She has had no heart problems in the past other than the rheumatic fever episode.   ECG: NSR, narrow inferior Qs (unlikely to be pathologic), narrow Qs in V3-V6.   PMH: 1. HTN 2. Hyperlipidemia 3. Low back pain 4. Ulcerative colitis: Has been quiescent.  5. GERD 6. History of melanoma 7. H/o rheumatic fever: has had murmur since childhood.  Echo in 7/12 showed EF 55-60%, mild focal basal septal hypertrophy, grade II diastolic dysfunction, the LV outflow tract was narrowed with turbulent flow but no significant LVOT gradient measured, ascending aorta mildly dilated at 3.9 cm, normal RV.   SH: Lives in apartment in [place].  Nonsmoker.   FH: Grandmother with "heart trouble"  ROS: All systems reviewed and negative except as per HPI.   Current Outpatient Prescriptions  Medication Sig Dispense Refill  . amoxicillin (AMOXIL) 500 MG capsule Take 2,000 mg by mouth once as needed. Prior to dental procedure      . benazepril-hydrochlorthiazide (LOTENSIN HCT) 20-12.5 MG per tablet take 1/2 tablet by mouth every morning  45 tablet  3  . Calcium Carbonate-Vitamin D (CALTRATE 600+D) 600-400 MG-UNIT per tablet Take 1 tablet by mouth 2 (two) times daily.        . cefUROXime (CEFTIN) 500 MG tablet Take  1 tablet (500 mg total) by mouth 2 (two) times daily.  14 tablet  0  . cetirizine (ZYRTEC) 10 MG tablet Take 10 mg by mouth as needed. Allergies.      . Cholecalciferol (VITAMIN D) 2000 UNITS CAPS Take 2,000 Units by mouth daily.       . fluticasone (FLONASE) 50 MCG/ACT nasal spray Place 1 spray into the nose 2 (two) times daily as needed for rhinitis.  16 g  2  . LIALDA 1.2 G EC tablet take 2 tablets by mouth daily with BREAKFAST  60 tablet  3  . Multiple Vitamin (MULTIVITAMIN) tablet Take 1 tablet by mouth daily.        . vitamin E 400 UNIT capsule Take 400 Units by mouth daily.          BP 122/62  Pulse 83  Ht 5\' 3"  (1.6 m)  Wt 117 lb (53.071 kg)  BMI 20.73 kg/m2 General: NAD Neck: No JVD, no thyromegaly or thyroid nodule.  Lungs: Clear to auscultation bilaterally with normal respiratory effort. CV: Nondisplaced PMI.  Heart regular S1/S2, no S3/S4, 2/6 systolic murmur heard across the precordium.  No peripheral edema.  No carotid bruit.  Normal pedal pulses.  Abdomen: Soft, nontender, no hepatosplenomegaly, no distention.  Skin: Intact without lesions or rashes.  Neurologic: Alert and oriented x 3.  Psych: Normal affect. Extremities: No clubbing or cyanosis.  HEENT: Normal.  Assessment/Plan:  72 yo presents for evaluation of heart murmur.  She says she has been told that she has a heart murmur for as long as she can remember.  She had an episode that was thought to be rheumatic fever when she was a child.  She had an echo in 2012 without any significant valvular abnormalities but the LV outflow tract was narrowed by focal basal septal hypertrophy.  There was turbulence across the outflow tract but a significant gradient was not identified.  She has not had any cardiac symptoms.  I will have her repeat an echocardiogram to re-look at the valves and at the LV outflow tract.  If she has a significant LVOT gradient, we may want to consider use of a beta blocker.  She asks about dental  prophylaxis.  By current guidelines, she does not have to take it.   Marca Ancona 04/01/2012 4:40 PM

## 2012-04-12 ENCOUNTER — Other Ambulatory Visit: Payer: Self-pay | Admitting: *Deleted

## 2012-04-12 ENCOUNTER — Encounter: Payer: Self-pay | Admitting: *Deleted

## 2012-04-12 ENCOUNTER — Ambulatory Visit (HOSPITAL_COMMUNITY): Payer: Medicare Other | Attending: Cardiology | Admitting: Radiology

## 2012-04-12 ENCOUNTER — Telehealth: Payer: Self-pay | Admitting: Cardiology

## 2012-04-12 DIAGNOSIS — R011 Cardiac murmur, unspecified: Secondary | ICD-10-CM | POA: Diagnosis not present

## 2012-04-12 DIAGNOSIS — I10 Essential (primary) hypertension: Secondary | ICD-10-CM | POA: Insufficient documentation

## 2012-04-12 DIAGNOSIS — I059 Rheumatic mitral valve disease, unspecified: Secondary | ICD-10-CM | POA: Diagnosis not present

## 2012-04-12 DIAGNOSIS — I421 Obstructive hypertrophic cardiomyopathy: Secondary | ICD-10-CM

## 2012-04-12 DIAGNOSIS — I369 Nonrheumatic tricuspid valve disorder, unspecified: Secondary | ICD-10-CM | POA: Diagnosis not present

## 2012-04-12 NOTE — Telephone Encounter (Signed)
Walk in pt Form " Pt Needs Note about Amoxicillin" Sent to [employer]  04/12/12/KM

## 2012-04-12 NOTE — Progress Notes (Signed)
Echocardiogram performed.  

## 2012-04-13 NOTE — Telephone Encounter (Signed)
Letter done, mailed to pt

## 2012-05-10 ENCOUNTER — Telehealth: Payer: Self-pay | Admitting: Internal Medicine

## 2012-05-10 DIAGNOSIS — H9190 Unspecified hearing loss, unspecified ear: Secondary | ICD-10-CM

## 2012-05-10 NOTE — Telephone Encounter (Signed)
Patient states she needs referral for hearing test sent to [facility] at [phone]

## 2012-05-10 NOTE — Telephone Encounter (Signed)
Ok ; hearing loss

## 2012-05-11 NOTE — Telephone Encounter (Signed)
Discuss with patient, referral placed 

## 2012-05-25 DIAGNOSIS — H903 Sensorineural hearing loss, bilateral: Secondary | ICD-10-CM | POA: Diagnosis not present

## 2012-07-21 ENCOUNTER — Ambulatory Visit: Payer: Medicare Other | Admitting: Gastroenterology

## 2012-08-10 ENCOUNTER — Encounter: Payer: Self-pay | Admitting: Gastroenterology

## 2012-08-10 ENCOUNTER — Ambulatory Visit (INDEPENDENT_AMBULATORY_CARE_PROVIDER_SITE_OTHER): Payer: Medicare Other | Admitting: Gastroenterology

## 2012-08-10 ENCOUNTER — Other Ambulatory Visit (INDEPENDENT_AMBULATORY_CARE_PROVIDER_SITE_OTHER): Payer: Medicare Other

## 2012-08-10 VITALS — BP 134/70 | HR 80 | Ht 61.0 in | Wt 116.0 lb

## 2012-08-10 DIAGNOSIS — K5190 Ulcerative colitis, unspecified, without complications: Secondary | ICD-10-CM

## 2012-08-10 LAB — CBC WITH DIFFERENTIAL/PLATELET
Basophils Relative: 0.6 % (ref 0.0–3.0)
Eosinophils Relative: 0.3 % (ref 0.0–5.0)
HCT: 34.2 % — ABNORMAL LOW (ref 36.0–46.0)
Hemoglobin: 11.1 g/dL — ABNORMAL LOW (ref 12.0–15.0)
Lymphs Abs: 1.2 10*3/uL (ref 0.7–4.0)
Monocytes Relative: 8.2 % (ref 3.0–12.0)
Platelets: 215 10*3/uL (ref 150.0–400.0)
RBC: 4.3 Mil/uL (ref 3.87–5.11)
WBC: 8 10*3/uL (ref 4.5–10.5)

## 2012-08-10 LAB — COMPREHENSIVE METABOLIC PANEL
Albumin: 3.8 g/dL (ref 3.5–5.2)
CO2: 30 mEq/L (ref 19–32)
GFR: 85.83 mL/min (ref >60.00)
Glucose, Bld: 88 mg/dL (ref 70–99)
Sodium: 141 mEq/L (ref 135–145)
Total Bilirubin: 0.3 mg/dL (ref 0.3–1.2)
Total Protein: 8.6 g/dL — ABNORMAL HIGH (ref 6.0–8.3)

## 2012-08-10 NOTE — Assessment & Plan Note (Signed)
She remains in clinical remission on Lialda.  Medications #1 continue lialda #2 check CBC and BMET every 6 months. LFTs yearly

## 2012-08-10 NOTE — Patient Instructions (Addendum)
You will go to the basement today for labs You will need labs again in 6 months and 12 months Please follow up with Dr Melinda Crutch in one year

## 2012-08-10 NOTE — Progress Notes (Signed)
History of Present Illness: A 72 year old white female with left-sided colitis diagnosed 2006 maintained on lialda here for routine followup. She has no GI complaints including change of bowel habits, abdominal pain or bleeding. Altogether she is feeling well.    Past Medical History  Diagnosis Date  . Ulcerative colitis   . Osteopenia     s/p 7 yrs Fosamax  . Hypertension   . Hyperlipidemia   . Sleep disorder   . GERD (gastroesophageal reflux disease)   . Heart murmur   . Melanoma    Past Surgical History  Procedure Laterality Date  . Breast lumpectomy  1986    left  . Neck surgery  1986    Benign growth on neck removed  . Dilation and curettage of uterus  12/1999  . Colonoscopy  2006  . Tonsillectomy    . Melanoma excision  2007    left arm  . Cataract extraction      Left eye    family history includes Cancer in her paternal aunt; Diabetes in her paternal grandmother; and Other in her mother.  There is no history of Coronary artery disease. Current Outpatient Prescriptions  Medication Sig Dispense Refill  . benazepril-hydrochlorthiazide (LOTENSIN HCT) 20-12.5 MG per tablet take 1/2 tablet by mouth every morning  45 tablet  3  . Calcium Carbonate-Vitamin D (CALTRATE 600+D) 600-400 MG-UNIT per tablet Take 1 tablet by mouth 2 (two) times daily.        . cetirizine (ZYRTEC) 10 MG tablet Take 10 mg by mouth as needed. Allergies.      . Cholecalciferol (VITAMIN D) 2000 UNITS CAPS Take 2,000 Units by mouth daily.       . fluticasone (FLONASE) 50 MCG/ACT nasal spray Place 1 spray into the nose 2 (two) times daily as needed for rhinitis.  16 g  2  . LIALDA 1.2 G EC tablet take 2 tablets by mouth daily with BREAKFAST  60 tablet  3  . Multiple Vitamin (MULTIVITAMIN) tablet Take 1 tablet by mouth daily.        . vitamin E 400 UNIT capsule Take 400 Units by mouth daily.         No current facility-administered medications for this visit.   Allergies as of 08/10/2012  . (No Known  Allergies)    reports that she has never smoked. She has never used smokeless tobacco. She reports that  drinks alcohol. She reports that she does not use illicit drugs.     Review of Systems: She has some tenderness to palpation over her manubrium Pertinent positive and negative review of systems were noted in the above HPI section. All other review of systems were otherwise negative.  Vital signs were reviewed in today's medical record Physical Exam: General: Well developed , well nourished, no acute distress Skin: anicteric Head: Normocephalic and atraumatic Eyes:  sclerae anicteric, EOMI Ears: Normal auditory acuity Mouth: No deformity or lesions Neck: Supple, no masses or thyromegaly Lungs: Clear throughout to auscultation Heart: Regular rate and rhythm; no murmurs, rubs or bruits Abdomen: Soft, non tender and non distended. No masses, hepatosplenomegaly or hernias noted. Normal Bowel sounds Rectal:deferred Musculoskeletal: Symmetrical with no gross deformities  Skin: No lesions on visible extremities Pulses:  Normal pulses noted Extremities: No clubbing, cyanosis, edema or deformities noted Neurological: Alert oriented x 4, grossly nonfocal Cervical Nodes:  No significant cervical adenopathy Inguinal Nodes: No significant inguinal adenopathy Psychological:  Alert and cooperative. Normal mood and affect

## 2012-08-11 NOTE — Progress Notes (Signed)
Quick Note:  Please inform the patient that lab work was normal and to continue current plan of action ______ 

## 2012-08-25 ENCOUNTER — Other Ambulatory Visit: Payer: Self-pay | Admitting: Internal Medicine

## 2012-09-07 ENCOUNTER — Other Ambulatory Visit: Payer: Self-pay | Admitting: *Deleted

## 2012-09-07 MED ORDER — MESALAMINE 1.2 G PO TBEC: DELAYED_RELEASE_TABLET | ORAL | Status: DC

## 2012-11-17 DIAGNOSIS — H2510 Age-related nuclear cataract, unspecified eye: Secondary | ICD-10-CM | POA: Diagnosis not present

## 2012-11-17 DIAGNOSIS — H179 Unspecified corneal scar and opacity: Secondary | ICD-10-CM | POA: Diagnosis not present

## 2012-11-17 DIAGNOSIS — Z961 Presence of intraocular lens: Secondary | ICD-10-CM | POA: Diagnosis not present

## 2012-11-25 ENCOUNTER — Other Ambulatory Visit: Payer: Self-pay | Admitting: Internal Medicine

## 2012-11-25 DIAGNOSIS — Z1231 Encounter for screening mammogram for malignant neoplasm of breast: Secondary | ICD-10-CM

## 2012-12-06 ENCOUNTER — Ambulatory Visit (HOSPITAL_COMMUNITY): Admission: RE | Admit: 2012-12-06 | Payer: Medicare Other | Source: Ambulatory Visit

## 2012-12-13 DIAGNOSIS — H179 Unspecified corneal scar and opacity: Secondary | ICD-10-CM | POA: Diagnosis not present

## 2012-12-13 DIAGNOSIS — H5500 Unspecified nystagmus: Secondary | ICD-10-CM | POA: Diagnosis not present

## 2012-12-13 DIAGNOSIS — H2640 Unspecified secondary cataract: Secondary | ICD-10-CM | POA: Diagnosis not present

## 2012-12-13 DIAGNOSIS — H259 Unspecified age-related cataract: Secondary | ICD-10-CM | POA: Diagnosis not present

## 2012-12-23 DIAGNOSIS — H179 Unspecified corneal scar and opacity: Secondary | ICD-10-CM | POA: Diagnosis not present

## 2012-12-23 DIAGNOSIS — Z23 Encounter for immunization: Secondary | ICD-10-CM | POA: Diagnosis not present

## 2012-12-23 DIAGNOSIS — H2640 Unspecified secondary cataract: Secondary | ICD-10-CM | POA: Diagnosis not present

## 2012-12-23 DIAGNOSIS — H26499 Other secondary cataract, unspecified eye: Secondary | ICD-10-CM | POA: Diagnosis not present

## 2013-01-04 DIAGNOSIS — H269 Unspecified cataract: Secondary | ICD-10-CM | POA: Diagnosis not present

## 2013-01-04 DIAGNOSIS — H25019 Cortical age-related cataract, unspecified eye: Secondary | ICD-10-CM | POA: Diagnosis not present

## 2013-01-04 DIAGNOSIS — H2510 Age-related nuclear cataract, unspecified eye: Secondary | ICD-10-CM | POA: Diagnosis not present

## 2013-01-04 DIAGNOSIS — H179 Unspecified corneal scar and opacity: Secondary | ICD-10-CM | POA: Diagnosis not present

## 2013-01-04 DIAGNOSIS — H25039 Anterior subcapsular polar age-related cataract, unspecified eye: Secondary | ICD-10-CM | POA: Diagnosis not present

## 2013-01-05 ENCOUNTER — Other Ambulatory Visit: Payer: Self-pay | Admitting: *Deleted

## 2013-01-05 MED ORDER — MESALAMINE 1.2 G PO TBEC: DELAYED_RELEASE_TABLET | ORAL | Status: DC

## 2013-02-23 DIAGNOSIS — H353290 Exudative age-related macular degeneration, unspecified eye, stage unspecified: Secondary | ICD-10-CM | POA: Diagnosis not present

## 2013-03-03 ENCOUNTER — Other Ambulatory Visit: Payer: Self-pay | Admitting: Internal Medicine

## 2013-03-04 NOTE — Telephone Encounter (Signed)
Rx sent to the pharmacy by e-script.  Pt needs office visit for BP check.//AB/CMA 

## 2013-04-01 DIAGNOSIS — Z85828 Personal history of other malignant neoplasm of skin: Secondary | ICD-10-CM | POA: Diagnosis not present

## 2013-04-01 DIAGNOSIS — L723 Sebaceous cyst: Secondary | ICD-10-CM | POA: Diagnosis not present

## 2013-04-01 DIAGNOSIS — L738 Other specified follicular disorders: Secondary | ICD-10-CM | POA: Diagnosis not present

## 2013-04-01 DIAGNOSIS — Z85820 Personal history of malignant melanoma of skin: Secondary | ICD-10-CM | POA: Diagnosis not present

## 2013-04-01 DIAGNOSIS — D1801 Hemangioma of skin and subcutaneous tissue: Secondary | ICD-10-CM | POA: Diagnosis not present

## 2013-04-01 DIAGNOSIS — L821 Other seborrheic keratosis: Secondary | ICD-10-CM | POA: Diagnosis not present

## 2013-04-20 ENCOUNTER — Ambulatory Visit (INDEPENDENT_AMBULATORY_CARE_PROVIDER_SITE_OTHER): Payer: Medicare Other | Admitting: Internal Medicine

## 2013-04-20 ENCOUNTER — Encounter: Payer: Self-pay | Admitting: Internal Medicine

## 2013-04-20 VITALS — BP 131/67 | HR 91 | Temp 97.8°F | Wt 109.2 lb

## 2013-04-20 DIAGNOSIS — R071 Chest pain on breathing: Secondary | ICD-10-CM | POA: Diagnosis not present

## 2013-04-20 DIAGNOSIS — I10 Essential (primary) hypertension: Secondary | ICD-10-CM

## 2013-04-20 DIAGNOSIS — R0789 Other chest pain: Secondary | ICD-10-CM

## 2013-04-20 DIAGNOSIS — R1319 Other dysphagia: Secondary | ICD-10-CM | POA: Diagnosis not present

## 2013-04-20 LAB — CBC WITH DIFFERENTIAL/PLATELET
BASOS PCT: 0.8 % (ref 0.0–3.0)
Basophils Absolute: 0.1 10*3/uL (ref 0.0–0.1)
EOS PCT: 0.2 % (ref 0.0–5.0)
Eosinophils Absolute: 0 10*3/uL (ref 0.0–0.7)
HCT: 32.7 % — ABNORMAL LOW (ref 36.0–46.0)
Hemoglobin: 10 g/dL — ABNORMAL LOW (ref 12.0–15.0)
LYMPHS PCT: 12.4 % (ref 12.0–46.0)
Lymphs Abs: 0.8 10*3/uL (ref 0.7–4.0)
MCHC: 30.5 g/dL (ref 30.0–36.0)
MCV: 81.9 fl (ref 78.0–100.0)
MONO ABS: 0.6 10*3/uL (ref 0.1–1.0)
MONOS PCT: 9.2 % (ref 3.0–12.0)
NEUTROS PCT: 77.4 % — AB (ref 43.0–77.0)
Neutro Abs: 5 10*3/uL (ref 1.4–7.7)
PLATELETS: 196 10*3/uL (ref 150.0–400.0)
RBC: 4 Mil/uL (ref 3.87–5.11)
RDW: 17.3 % — ABNORMAL HIGH (ref 11.5–14.6)
WBC: 6.5 10*3/uL (ref 4.5–10.5)

## 2013-04-20 LAB — BASIC METABOLIC PANEL
BUN: 17 mg/dL (ref 6–23)
CALCIUM: 9.1 mg/dL (ref 8.4–10.5)
CHLORIDE: 104 meq/L (ref 96–112)
CO2: 30 mEq/L (ref 19–32)
CREATININE: 0.7 mg/dL (ref 0.4–1.2)
GFR: 82.96 mL/min (ref >60.00)
Glucose, Bld: 107 mg/dL — ABNORMAL HIGH (ref 70–99)
Potassium: 3.7 mEq/L (ref 3.5–5.1)
Sodium: 139 mEq/L (ref 135–145)

## 2013-04-20 LAB — HEPATIC FUNCTION PANEL
ALBUMIN: 3.6 g/dL (ref 3.5–5.2)
ALK PHOS: 116 U/L (ref 39–117)
ALT: 21 U/L (ref 0–35)
AST: 22 U/L (ref 0–37)
BILIRUBIN DIRECT: 0 mg/dL (ref 0.0–0.3)
TOTAL PROTEIN: 7.8 g/dL (ref 6.0–8.3)
Total Bilirubin: 0.2 mg/dL — ABNORMAL LOW (ref 0.3–1.2)

## 2013-04-20 MED ORDER — BENAZEPRIL-HYDROCHLOROTHIAZIDE 20-12.5 MG PO TABS: ORAL_TABLET | ORAL | Status: DC

## 2013-04-20 MED ORDER — RANITIDINE HCL 150 MG PO TABS
150.0000 mg | ORAL_TABLET | Freq: Two times a day (BID) | ORAL | Status: DC
Start: 2013-04-20 — End: 2013-07-12

## 2013-04-20 NOTE — Progress Notes (Signed)
   Subjective:    Patient ID: Tammy Beard, female    DOB: 1940/04/06, 73 y.o.   MRN: [id number]  HPI Blood pressure not monitored.  Compliant with anti hypertemsive medication. No lightheadedness or other adverse medication effect described.       Review of Systems Significant headaches, epistaxis, chest pain, palpitations, exertional dyspnea, claudication, paroxysmal nocturnal dyspnea, or edema absent. Unexplained weight loss of 5 # over 3 months She describes some dyspepsia with dysphagia 2/42 requiring Heimlich maneuver. Sharp RUQ  abdominal pain 1/25 prior to dysphagia. Tylenol & Aspercreme help. PMH of R inferior chest wall injury after a fall  ?2007. No  melena, rectal bleeding, or small caliber stools. No fever , chills or sweats.  Colonoscopy done < 10 years ago. PMH of colitis. No upper endoscopy to date.       Objective:   Physical Exam  Thin but appears healthy and well-nourished & in no acute distress  No carotid bruits are present.No neck pain distention present at 10 - 15 degrees. Thyroid normal to palpation  Heart rhythm and rate are normal . Grade 2 systolic murmur loudest at the base.  Chest is clear with no increased work of breathing. Tenderness to palpation over the right inferior rib cage.  There is no evidence of aortic aneurysm or renal artery bruits  Abdomen soft with no organomegaly or masses. No HJR. Minimal upper abdominal tenderness to palpation.  No clubbing, cyanosis or edema present.  Pedal pulses are intact   No ischemic skin changes are present . Nails healthy   Alert and oriented. Strength, tone, DTRs reflexes normal  Negative straight leg raising bilaterally          Assessment & Plan:  #1 HTN #2 RUQ pain since 04/17/13. Clinically this is chest wall rather than abdominal. #3 dysphagia requiring Heimlich #4 PMH UC See orders

## 2013-04-20 NOTE — Assessment & Plan Note (Signed)
BMET  Goals discussed

## 2013-04-20 NOTE — Patient Instructions (Addendum)
Your next office appointment will be determined based upon review of your pending labs & x-rays. Those instructions will be transmitted to you  by mail . Followup as needed for your this acute issue. Please report any significant change in your symptoms. Order for x-rays entered into  the computer; these will be performed at [facility]. across from [facility]. No appointment is necessary. inimal Blood Pressure Goal= AVERAGE < 140/90;  Ideal is an AVERAGE < 135/85. This AVERAGE should be calculated from @ least 5-7 BP readings taken @ different times of day on different days of week. You should not respond to isolated BP readings , but rather the AVERAGE for that week .Please bring your  blood pressure cuff to office visits to verify that it is reliable.It  can also be checked against the blood pressure device at the pharmacy. Finger or wrist cuffs are not dependable; an arm cuff is. Reflux of gastric acid may be asymptomatic as this may occur mainly during sleep.The triggers for reflux  include stress; the "aspirin family" ; alcohol; peppermint; and caffeine (coffee, tea, cola, and chocolate). The aspirin family would include aspirin and the nonsteroidal agents such as ibuprofen &  Naproxen. Tylenol would not cause reflux. If having symptoms ; food & drink should be avoided for @ least 2 hours before going to bed.  Take the ranitidine 30 minutes before breakfast and evening meal.

## 2013-04-20 NOTE — Progress Notes (Signed)
Pre visit review using our clinic review tool, if applicable. No additional management support is needed unless otherwise documented below in the visit note. 

## 2013-04-21 ENCOUNTER — Ambulatory Visit: Payer: Medicare Other

## 2013-04-21 DIAGNOSIS — R7309 Other abnormal glucose: Secondary | ICD-10-CM

## 2013-04-21 LAB — HEMOGLOBIN A1C: HEMOGLOBIN A1C: 6.1 % (ref 4.6–6.5)

## 2013-04-22 ENCOUNTER — Ambulatory Visit (INDEPENDENT_AMBULATORY_CARE_PROVIDER_SITE_OTHER)
Admission: RE | Admit: 2013-04-22 | Discharge: 2013-04-22 | Disposition: A | Discharge status: Home / Self Care | Payer: Medicare Other | Source: Ambulatory Visit | Attending: Internal Medicine | Admitting: Internal Medicine

## 2013-04-22 DIAGNOSIS — R071 Chest pain on breathing: Secondary | ICD-10-CM

## 2013-04-22 DIAGNOSIS — R0789 Other chest pain: Secondary | ICD-10-CM

## 2013-04-22 DIAGNOSIS — J8410 Pulmonary fibrosis, unspecified: Secondary | ICD-10-CM | POA: Diagnosis not present

## 2013-04-22 DIAGNOSIS — J449 Chronic obstructive pulmonary disease, unspecified: Secondary | ICD-10-CM | POA: Diagnosis not present

## 2013-04-25 ENCOUNTER — Encounter: Payer: Self-pay | Admitting: *Deleted

## 2013-05-24 ENCOUNTER — Encounter: Payer: Self-pay | Admitting: Gastroenterology

## 2013-05-24 ENCOUNTER — Other Ambulatory Visit: Payer: Medicare Other

## 2013-05-24 ENCOUNTER — Ambulatory Visit (INDEPENDENT_AMBULATORY_CARE_PROVIDER_SITE_OTHER): Payer: Medicare Other | Admitting: Gastroenterology

## 2013-05-24 VITALS — BP 120/60 | HR 68 | Ht 61.0 in | Wt 107.0 lb

## 2013-05-24 DIAGNOSIS — D509 Iron deficiency anemia, unspecified: Secondary | ICD-10-CM | POA: Insufficient documentation

## 2013-05-24 DIAGNOSIS — K5190 Ulcerative colitis, unspecified, without complications: Secondary | ICD-10-CM

## 2013-05-24 DIAGNOSIS — D649 Anemia, unspecified: Secondary | ICD-10-CM | POA: Diagnosis not present

## 2013-05-24 NOTE — Progress Notes (Signed)
          History of Present Illness:  73 year old white female with history of left-sided colitis here for routine followup.  From a GI standpoint she's feeling quite well.  She continues on Lialda and is having regular solid bowel movements.  She is without pain.  She had one episode of choking that required a Heimlich maneuver.  She denies dysphagia.  She really has pyrosis for which she takes ranitidine.    Review of Systems: Pertinent positive and negative review of systems were noted in the above HPI section. All other review of systems were otherwise negative.    Current Medications, Allergies, Past Medical History, Past Surgical History, Family History and Social History were reviewed in [facility] record  Vital signs were reviewed in today's medical record. Physical Exam: General: Well developed , well nourished, no acute distress Skin: anicteric Head: Normocephalic and atraumatic Eyes:  sclerae anicteric, EOMI Ears: Normal auditory acuity Mouth: No deformity or lesions Lungs: Clear throughout to auscultation Heart: Regular rate and rhythm; no rubs or bruits.  There is a 2/6 early systolic murmur heard loudest at the left sternal border Abdomen: Soft, non tender and non distended. No masses.  , hepatosplenomegaly or hernias noted. Normal Bowel sounds Rectal:deferred Musculoskeletal: Symmetrical with no gross deformities  Pulses:  Normal pulses noted Extremities: No clubbing, cyanosis, edema or deformities noted Neurological: Alert oriented x 4, grossly nonfocal Psychological:  Alert and cooperative. Normal mood and affect  See Assessment and Plan under Problem List

## 2013-05-24 NOTE — Assessment & Plan Note (Addendum)
Patient remains in clinical remission on Lialda.  Recommendations #1 check renal function twice yearly #2 continue Lialda #3 check serologies for hepatitis A. NP and vaccinate accordingly #4 to consider repeat bone density scan #5 colonoscopy in one year

## 2013-05-24 NOTE — Patient Instructions (Signed)
Go to the basement for labs today And pick up your IFOB kit, Please return within 2 weeks

## 2013-05-24 NOTE — Assessment & Plan Note (Signed)
Borderline microcytic anemia probably of multiple etiologies.  Chronic GI blood loss should be ruled out.    Recommendations #1stool Hemoccults

## 2013-05-25 ENCOUNTER — Other Ambulatory Visit: Payer: Self-pay | Admitting: *Deleted

## 2013-05-25 LAB — HEPATITIS A ANTIBODY, TOTAL: HEP A TOTAL AB: NONREACTIVE

## 2013-05-25 LAB — HEPATITIS B SURFACE ANTIGEN: Hepatitis B Surface Ag: NEGATIVE

## 2013-05-25 LAB — HEPATITIS B SURFACE ANTIBODY,QUALITATIVE: HEP B S AB: NEGATIVE

## 2013-05-25 MED ORDER — MESALAMINE 1.2 G PO TBEC: DELAYED_RELEASE_TABLET | ORAL | Status: DC

## 2013-05-31 ENCOUNTER — Other Ambulatory Visit: Payer: Self-pay

## 2013-05-31 DIAGNOSIS — K5190 Ulcerative colitis, unspecified, without complications: Secondary | ICD-10-CM

## 2013-06-08 ENCOUNTER — Ambulatory Visit (INDEPENDENT_AMBULATORY_CARE_PROVIDER_SITE_OTHER): Payer: Medicare Other | Admitting: Gastroenterology

## 2013-06-08 DIAGNOSIS — Z23 Encounter for immunization: Secondary | ICD-10-CM

## 2013-06-08 DIAGNOSIS — K5190 Ulcerative colitis, unspecified, without complications: Secondary | ICD-10-CM

## 2013-06-15 ENCOUNTER — Ambulatory Visit (INDEPENDENT_AMBULATORY_CARE_PROVIDER_SITE_OTHER): Payer: Medicare Other | Admitting: Gastroenterology

## 2013-06-15 DIAGNOSIS — K5190 Ulcerative colitis, unspecified, without complications: Secondary | ICD-10-CM | POA: Diagnosis not present

## 2013-06-15 DIAGNOSIS — Z23 Encounter for immunization: Secondary | ICD-10-CM | POA: Diagnosis not present

## 2013-06-15 NOTE — Progress Notes (Signed)
#  3 [facility] appointment made for April 8th 2015.

## 2013-06-16 ENCOUNTER — Other Ambulatory Visit: Payer: Self-pay | Admitting: *Deleted

## 2013-06-16 ENCOUNTER — Other Ambulatory Visit (INDEPENDENT_AMBULATORY_CARE_PROVIDER_SITE_OTHER): Payer: Medicare Other

## 2013-06-16 DIAGNOSIS — R195 Other fecal abnormalities: Secondary | ICD-10-CM

## 2013-06-16 LAB — FECAL OCCULT BLOOD, IMMUNOCHEMICAL: Fecal Occult Bld: POSITIVE — AB

## 2013-06-21 ENCOUNTER — Other Ambulatory Visit: Payer: Self-pay

## 2013-06-21 DIAGNOSIS — K921 Melena: Secondary | ICD-10-CM

## 2013-06-22 ENCOUNTER — Telehealth: Payer: Self-pay | Admitting: Gastroenterology

## 2013-06-23 NOTE — Telephone Encounter (Signed)
Reviewed lab results with pt.

## 2013-06-29 ENCOUNTER — Ambulatory Visit (AMBULATORY_SURGERY_CENTER): Payer: Self-pay

## 2013-06-29 ENCOUNTER — Ambulatory Visit (INDEPENDENT_AMBULATORY_CARE_PROVIDER_SITE_OTHER): Payer: Medicare Other | Admitting: Gastroenterology

## 2013-06-29 VITALS — Ht 62.0 in | Wt 109.0 lb

## 2013-06-29 DIAGNOSIS — K5190 Ulcerative colitis, unspecified, without complications: Secondary | ICD-10-CM | POA: Diagnosis not present

## 2013-06-29 DIAGNOSIS — Z23 Encounter for immunization: Secondary | ICD-10-CM

## 2013-06-29 DIAGNOSIS — R195 Other fecal abnormalities: Secondary | ICD-10-CM

## 2013-06-29 MED ORDER — SUPREP BOWEL PREP KIT 17.5-3.13-1.6 GM/177ML PO SOLN: 1.0000 | Freq: Once | ORAL | Status: DC

## 2013-07-05 ENCOUNTER — Ambulatory Visit (AMBULATORY_SURGERY_CENTER): Payer: Medicare Other | Admitting: Gastroenterology

## 2013-07-05 ENCOUNTER — Encounter: Payer: Self-pay | Admitting: Gastroenterology

## 2013-07-05 VITALS — BP 128/78 | HR 76 | Temp 97.9°F | Resp 26 | Ht 62.0 in | Wt 109.0 lb

## 2013-07-05 DIAGNOSIS — R195 Other fecal abnormalities: Secondary | ICD-10-CM | POA: Diagnosis not present

## 2013-07-05 DIAGNOSIS — I10 Essential (primary) hypertension: Secondary | ICD-10-CM | POA: Diagnosis not present

## 2013-07-05 DIAGNOSIS — K5289 Other specified noninfective gastroenteritis and colitis: Secondary | ICD-10-CM | POA: Diagnosis not present

## 2013-07-05 HISTORY — PX: OTHER SURGICAL HISTORY: SHX169

## 2013-07-05 LAB — HM COLONOSCOPY: HM COLON: NORMAL

## 2013-07-05 MED ORDER — SODIUM CHLORIDE 0.9 % IV SOLN: 500.0000 mL | INTRAVENOUS | Status: DC

## 2013-07-05 NOTE — Progress Notes (Signed)
Lidocaine-40mg IV prior to Propofol InductionPropofol given over incremental dosages 

## 2013-07-05 NOTE — Op Note (Signed)
[place]  [address]. [place], [id number]   COLONOSCOPY PROCEDURE REPORT  PATIENT: Tammy, Beard  MR#: [id number] BIRTHDATE: 1940-10-11 , 72  yrs. old GENDER: Female ENDOSCOPIST: Inda Castle, MD REFERRED BY: PROCEDURE DATE:  07/05/2013 PROCEDURE:   Colonoscopy, diagnostic First Screening Colonoscopy - Avg.  risk and is 50 yrs.  old or older - No.  Prior Negative Screening - Now for repeat screening. N/A  History of Adenoma - Now for follow-up colonoscopy & has been > or = to 3 yrs.  N/A  Polyps Removed Today? No.  Recommend repeat exam, <10 yrs? No. ASA CLASS:   Class II INDICATIONS:occult blood . MEDICATIONS: MAC sedation, administered by CRNA and propofol (Diprivan) 100mg  IV  DESCRIPTION OF PROCEDURE:   After the risks benefits and alternatives of the procedure were thoroughly explained, informed consent was obtained.  A digital rectal exam revealed no abnormalities of the rectum.   The LB [id number] and LB [id number]  endoscope was introduced through the anus and advanced to the terminal ileum which was intubated for a short distance. No adverse events experienced.   The quality of the prep was excellent using Suprep  The instrument was then slowly withdrawn as the colon was fully examined.      COLON FINDINGS: The mucosa appeared normal in the terminal ileum. A normal appearing cecum, ileocecal valve, and appendiceal orifice were identified.  The ascending, hepatic flexure, transverse, splenic flexure, descending, sigmoid colon and rectum appeared unremarkable.  No polyps or cancers were seen.  Retroflexed views revealed no abnormalities. The time to cecum=2 minutes 48 seconds. Withdrawal time=6 minutes 0 seconds.  The scope was withdrawn and the procedure completed. COMPLICATIONS: There were no complications.  ENDOSCOPIC IMPRESSION: 1.   Normal mucosa in the terminal ileum 2.   Normal colon  RECOMMENDATIONS: 1.   Continue surveillance 2.  Followup hemeoccults 1 week   eSigned:  Inda Castle, MD 07/05/2013 9:58 AM   cc: Hendricks Limes, MD   PATIENT NAME:  Tammy, Beard MR#: [id number]

## 2013-07-05 NOTE — Patient Instructions (Signed)
YOU HAD AN ENDOSCOPIC PROCEDURE TODAY AT THE [facility]: Refer to the procedure report that was given to you for any specific questions about what was found during the examination.  If the procedure report does not answer your questions, please call your gastroenterologist to clarify.  If you requested that your care partner not be given the details of your procedure findings, then the procedure report has been included in a sealed envelope for you to review at your convenience later.  YOU SHOULD EXPECT: Some feelings of bloating in the abdomen. Passage of more gas than usual.  Walking can help get rid of the air that was put into your GI tract during the procedure and reduce the bloating. If you had a lower endoscopy (such as a colonoscopy or flexible sigmoidoscopy) you may notice spotting of blood in your stool or on the toilet paper. If you underwent a bowel prep for your procedure, then you may not have a normal bowel movement for a few days.  DIET: Your first meal following the procedure should be a light meal and then it is ok to progress to your normal diet.  A half-sandwich or bowl of soup is an example of a good first meal.  Heavy or fried foods are harder to digest and may make you feel nauseous or bloated.  Likewise meals heavy in dairy and vegetables can cause extra gas to form and this can also increase the bloating.  Drink plenty of fluids but you should avoid alcoholic beverages for 24 hours.  ACTIVITY: Your care partner should take you home directly after the procedure.  You should plan to take it easy, moving slowly for the rest of the day.  You can resume normal activity the day after the procedure however you should NOT DRIVE or use heavy machinery for 24 hours (because of the sedation medicines used during the test).    SYMPTOMS TO REPORT IMMEDIATELY: A gastroenterologist can be reached at any hour.  During normal business hours, 8:30 AM to 5:00 PM Monday through Friday,  call [phone].  After hours and on weekends, please call the GI answering service at [phone] who will take a message and have the physician on call contact you.   Following lower endoscopy (colonoscopy or flexible sigmoidoscopy):  Excessive amounts of blood in the stool  Significant tenderness or worsening of abdominal pains  Swelling of the abdomen that is new, acute  Fever of 100F or higher  FOLLOW UP: If any biopsies were taken you will be contacted by phone or by letter within the next 1-3 weeks.  Call your gastroenterologist if you have not heard about the biopsies in 3 weeks.  Our staff will call the home number listed on your records the next business day following your procedure to check on you and address any questions or concerns that you may have at that time regarding the information given to you following your procedure. This is a courtesy call and so if there is no answer at the home number and we have not heard from you through the emergency physician on call, we will assume that you have returned to your regular daily activities without incident.  SIGNATURES/CONFIDENTIALITY: You and/or your care partner have signed paperwork which will be entered into your electronic medical record.  These signatures attest to the fact that that the information above on your After Visit Summary has been reviewed and is understood.  Full responsibility of the confidentiality of this   discharge information lies with you and/or your care-partner.  Dr. Deatra Ina requests that you do stool cards next week.  Please, follow the directions carefully to prevent a false positive.

## 2013-07-06 ENCOUNTER — Telehealth: Payer: Self-pay | Admitting: *Deleted

## 2013-07-06 ENCOUNTER — Other Ambulatory Visit: Payer: Self-pay

## 2013-07-06 DIAGNOSIS — K921 Melena: Secondary | ICD-10-CM

## 2013-07-06 NOTE — Telephone Encounter (Signed)
  Follow up Call-  Call back number 07/05/2013  Post procedure Call Back phone  # [phone]  Permission to leave phone message Yes     Patient questions:  Do you have a fever, pain , or abdominal swelling? no Pain Score  0 *  Have you tolerated food without any problems? yes  Have you been able to return to your normal activities? yes  Do you have any questions about your discharge instructions: Diet   no Medications  no Follow up visit  no  Do you have questions or concerns about your Care? no  Actions: * If pain score is 4 or above: No action needed, pain <4.

## 2013-07-12 ENCOUNTER — Other Ambulatory Visit: Payer: Self-pay | Admitting: Internal Medicine

## 2013-07-25 ENCOUNTER — Other Ambulatory Visit (INDEPENDENT_AMBULATORY_CARE_PROVIDER_SITE_OTHER): Payer: Medicare Other

## 2013-07-25 DIAGNOSIS — K921 Melena: Secondary | ICD-10-CM

## 2013-07-25 LAB — HEMOCCULT SLIDES (X 3 CARDS)
Fecal Occult Blood: NEGATIVE
OCCULT 1: NEGATIVE
OCCULT 2: NEGATIVE
OCCULT 3: NEGATIVE
OCCULT 4: NEGATIVE
OCCULT 5: NEGATIVE

## 2013-07-26 NOTE — Progress Notes (Signed)
Quick Note:  Please inform the patient that hemeoccults were negative and to continue current plan of action ______ 

## 2013-08-25 ENCOUNTER — Encounter: Payer: Self-pay | Admitting: Internal Medicine

## 2013-08-25 ENCOUNTER — Ambulatory Visit (INDEPENDENT_AMBULATORY_CARE_PROVIDER_SITE_OTHER): Payer: Medicare Other | Admitting: Internal Medicine

## 2013-08-25 ENCOUNTER — Other Ambulatory Visit (INDEPENDENT_AMBULATORY_CARE_PROVIDER_SITE_OTHER): Payer: Medicare Other

## 2013-08-25 VITALS — BP 144/82 | HR 102 | Temp 98.2°F | Resp 16 | Wt 105.0 lb

## 2013-08-25 DIAGNOSIS — R000 Tachycardia, unspecified: Secondary | ICD-10-CM | POA: Diagnosis not present

## 2013-08-25 DIAGNOSIS — M2550 Pain in unspecified joint: Secondary | ICD-10-CM | POA: Diagnosis not present

## 2013-08-25 DIAGNOSIS — R634 Abnormal weight loss: Secondary | ICD-10-CM

## 2013-08-25 DIAGNOSIS — B369 Superficial mycosis, unspecified: Secondary | ICD-10-CM

## 2013-08-25 LAB — CBC WITH DIFFERENTIAL/PLATELET
BASOS ABS: 0 10*3/uL (ref 0.0–0.1)
Basophils Relative: 0.4 % (ref 0.0–3.0)
Eosinophils Absolute: 0 10*3/uL (ref 0.0–0.7)
Eosinophils Relative: 0.1 % (ref 0.0–5.0)
HCT: 33.2 % — ABNORMAL LOW (ref 36.0–46.0)
Hemoglobin: 10.4 g/dL — ABNORMAL LOW (ref 12.0–15.0)
LYMPHS PCT: 12.7 % (ref 12.0–46.0)
Lymphs Abs: 1.2 10*3/uL (ref 0.7–4.0)
MCHC: 31.3 g/dL (ref 30.0–36.0)
MCV: 80.8 fl (ref 78.0–100.0)
MONO ABS: 0.8 10*3/uL (ref 0.1–1.0)
Monocytes Relative: 8.7 % (ref 3.0–12.0)
NEUTROS ABS: 7.1 10*3/uL (ref 1.4–7.7)
Neutrophils Relative %: 78.1 % — ABNORMAL HIGH (ref 43.0–77.0)
Platelets: 287 10*3/uL (ref 150.0–400.0)
RBC: 4.11 Mil/uL (ref 3.87–5.11)
RDW: 15.3 % (ref 11.5–15.5)
WBC: 9.1 10*3/uL (ref 4.0–10.5)

## 2013-08-25 LAB — SEDIMENTATION RATE: SED RATE: 110 mm/h — AB (ref 0–22)

## 2013-08-25 LAB — BASIC METABOLIC PANEL
BUN: 17 mg/dL (ref 6–23)
CO2: 29 mEq/L (ref 19–32)
Calcium: 10 mg/dL (ref 8.4–10.5)
Chloride: 96 mEq/L (ref 96–112)
Creatinine, Ser: 0.7 mg/dL (ref 0.4–1.2)
GFR: 91.51 mL/min (ref >60.00)
Glucose, Bld: 99 mg/dL (ref 70–99)
POTASSIUM: 4.3 meq/L (ref 3.5–5.1)
Sodium: 135 mEq/L (ref 135–145)

## 2013-08-25 LAB — HEPATIC FUNCTION PANEL
ALBUMIN: 3.5 g/dL (ref 3.5–5.2)
ALT: 19 U/L (ref 0–35)
AST: 10 U/L (ref 0–37)
Alkaline Phosphatase: 98 U/L (ref 39–117)
Bilirubin, Direct: 0.1 mg/dL (ref 0.0–0.3)
Total Bilirubin: 0.4 mg/dL (ref 0.2–1.2)
Total Protein: 8.3 g/dL (ref 6.0–8.3)

## 2013-08-25 LAB — T4, FREE: FREE T4: 0.86 ng/dL (ref 0.60–1.60)

## 2013-08-25 LAB — T3, FREE: T3, Free: 2.7 pg/mL (ref 2.3–4.2)

## 2013-08-25 LAB — TSH: TSH: 0.68 u[IU]/mL (ref 0.35–4.50)

## 2013-08-25 MED ORDER — TRAMADOL HCL 50 MG PO TABS: 50.0000 mg | ORAL_TABLET | Freq: Three times a day (TID) | ORAL | Status: DC | PRN

## 2013-08-25 MED ORDER — KETOCONAZOLE 2 % EX CREA: 1.0000 "application " | TOPICAL_CREAM | Freq: Every day | CUTANEOUS | Status: DC

## 2013-08-25 NOTE — Patient Instructions (Signed)
Your next office appointment will be determined based upon review of your pending labs . Those instructions will be transmitted to you  by mail. 

## 2013-08-25 NOTE — Progress Notes (Signed)
   Subjective:    Patient ID: Tammy Beard, female    DOB: 1940-11-13, 73 y.o.   MRN: [id number]  HPI  She's had a rash for several months. She is unsure of  when it exactly began. It has been localized to the abdomen.  She also questions possible dermatitis over the back but cannot visualize this area. She has been using a back scratcher for pruritis in this area.  There's been no suspect exposures such as plant or animals, cosmetics, or new medications.  The abdominal rash was treated with cortisone cream without response  She describes diffuse migratory-type joint pain mainly in the hips and lower extremities.  She's also had some positional neck and shoulder girdle symptoms  The pain can be as high as a level V; it is described as throbbing.  It is worse at night and is actually awakened her.  Tylenol as been of minimal to no benefit.      Review of Systems   She does have some fatigue. Her appetite has decreased.  She describes progressive weight loss. The flow sheet verifies that she has lost 11 pounds since May of last year. She has no blurred vision, double vision, loss of vision  She denies palpitations or tachycardia.  There are no changes of constipation or diarrhea  She has no hoarseness or dysphagia.  She also denies any significant change in hair skin or nails other than the rash  She does not note significant tremor  She has no heat or cold intolerance      Objective:   Physical Exam  She appears thin but adequately nourished  Extraocular motion is suboptimal; slight proptosis and some lid lag is suggested  She has bilateral hearing aids.  There is no scleral icterus or jaundice present.  Reversed curvature of the thoracic spine suggested. There is some accentuation of the lordotic curve of the superior thoracic spine.  She has no lymphadenopathy about the head, neck, axilla  She has an S4 tachycardia with flow murmur, grade 1  Chest  is clear to auscultation without abnormal breath sounds  She has no organomegaly or masses. The lower abdomen is doughy & slightly distended.  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  Straight leg raising is negative. Range of motion of extremities is also normal.  In reference to her "arthragias" her history is somewhat vague.          Assessment & Plan:  #1 Fungal dermatitis  #2 migratory arthralgias; rule out polymyalgia rheumatica  #3 weight loss  #4 tachycardia  See orders and recommendations

## 2013-08-25 NOTE — Progress Notes (Signed)
Subjective:    Patient ID: Tammy Beard, female    DOB: 1940/11/24, 73 y.o.   MRN: [id number]  HPI Rash and Generalized pain  Rash began a couple of months ago. The pt cannot specify when or where the rash began. She does state it is mostly located on her abdomen. She also thinks she may have some spots on her back but she can't visualize them.  She does believe the rash has become more widespread.The rash does not hurt but is itchy. The pt denies any exposures to new pets, new soaps, detergents or new medications. The pt has tried cortisone cream and gold bond power but saw no improvement.  Pt cannot state when this pain began. She believes it has been going on for months. The pt says the pain is mostly in her shoulders, arms, neck and across her upper and lower back. There is no pain in her lower body. The pain is described as throbbing and is rated at a 5/10. The pain seems to be worse at night and wakes the pt up from sleep and prevents her from going back to sleep. The pt has tried tylenol for the pain but had no relief. She reports a decreased appetite, fatigue, and states that today she has weighed the least amount to date.   Review of Systems  Constitutional: Positive for fatigue. Negative for fever and chills.  HENT: Negative for congestion, rhinorrhea and sore throat.   Respiratory: Negative for cough, shortness of breath and wheezing.   Cardiovascular: Negative for chest pain.  Musculoskeletal: Negative for joint swelling.  Allergic/Immunologic: Negative for environmental allergies.       Objective:   Physical Exam Gen.: Healthy and well-nourished in appearance. Alert, appropriate and cooperative throughout exam. Eyes: No corneal or conjunctival inflammation noted. Pupils equal round reactive to light and accommodation.  Nose: External nasal exam reveals no deformity or inflammation. Nasal mucosa are pink and moist. No lesions or exudates noted.   Mouth: Oral mucosa and  oropharynx reveal no lesions or exudates. Pt has full upper and lower palate.  Lungs: Normal respiratory effort; chest expands symmetrically. Lungs are clear to auscultation without rales, wheezes, or increased work of breathing. Heart: Normal rate and rhythm. Normal S1 and S2. No gallop, click, or rub. No murmur.                       Musculoskeletal/extremities: accentuated curvature of upper thoracic spine. Pt has full ROM in bilateral UEs. 5/5 strength of bilateral UE.  Able to lie down & sit up w/o help. Negative SLR bilaterally       Skin: Rash noted on RLQ, suprapubic area. There are two distinct red patches with scaly borders and central clearing.  Lymph: No cervical, axillary lymphadenopathy present.                                                                                    Assessment & Plan:  #1) Tinea corporis: lesions are red with central clearing and a scaly border   Give ketaconazole cream. Pt can take antihistamine for itch relief in the meantime.  #2) Generalized  pain;

## 2013-08-25 NOTE — Progress Notes (Signed)
Pre visit review using our clinic review tool, if applicable. No additional management support is needed unless otherwise documented below in the visit note. 

## 2013-08-26 ENCOUNTER — Telehealth: Payer: Self-pay

## 2013-08-26 ENCOUNTER — Other Ambulatory Visit: Payer: Self-pay | Admitting: Internal Medicine

## 2013-08-26 DIAGNOSIS — M353 Polymyalgia rheumatica: Secondary | ICD-10-CM | POA: Insufficient documentation

## 2013-08-26 MED ORDER — PREDNISONE 5 MG PO TABS: 5.0000 mg | ORAL_TABLET | Freq: Every day | ORAL | Status: DC

## 2013-08-26 NOTE — Telephone Encounter (Signed)
Per Dr Linna Darner, phone call to patient letting her know Prednisone has been sent to her pharmacy.

## 2013-09-09 ENCOUNTER — Other Ambulatory Visit: Payer: Self-pay | Admitting: Internal Medicine

## 2013-09-09 ENCOUNTER — Telehealth: Payer: Self-pay | Admitting: Internal Medicine

## 2013-09-09 DIAGNOSIS — B369 Superficial mycosis, unspecified: Secondary | ICD-10-CM

## 2013-09-09 NOTE — Telephone Encounter (Signed)
Patient advised referral to dermatology has been placed

## 2013-09-09 NOTE — Telephone Encounter (Signed)
Pt request phone call from the assistant concern about the fungal on her stomach that she was in the office on 08/25/13. Pt stated cream that Dr. Linna Darner gave her is not working and it is running out, pt was wondering if Dr. Linna Darner can give her something different? Please advise.

## 2013-09-09 NOTE — Progress Notes (Signed)
Patient aware.

## 2013-09-21 DIAGNOSIS — L259 Unspecified contact dermatitis, unspecified cause: Secondary | ICD-10-CM | POA: Diagnosis not present

## 2013-09-21 DIAGNOSIS — B360 Pityriasis versicolor: Secondary | ICD-10-CM | POA: Diagnosis not present

## 2013-09-21 DIAGNOSIS — Z85828 Personal history of other malignant neoplasm of skin: Secondary | ICD-10-CM | POA: Diagnosis not present

## 2013-09-21 DIAGNOSIS — Z85820 Personal history of malignant melanoma of skin: Secondary | ICD-10-CM | POA: Diagnosis not present

## 2013-10-05 ENCOUNTER — Other Ambulatory Visit: Payer: Self-pay

## 2013-10-05 DIAGNOSIS — Z85828 Personal history of other malignant neoplasm of skin: Secondary | ICD-10-CM | POA: Diagnosis not present

## 2013-10-05 DIAGNOSIS — R21 Rash and other nonspecific skin eruption: Secondary | ICD-10-CM | POA: Diagnosis not present

## 2013-10-05 DIAGNOSIS — L259 Unspecified contact dermatitis, unspecified cause: Secondary | ICD-10-CM | POA: Diagnosis not present

## 2013-10-05 DIAGNOSIS — L723 Sebaceous cyst: Secondary | ICD-10-CM | POA: Diagnosis not present

## 2013-10-05 DIAGNOSIS — Z85820 Personal history of malignant melanoma of skin: Secondary | ICD-10-CM | POA: Diagnosis not present

## 2013-10-14 DIAGNOSIS — H179 Unspecified corneal scar and opacity: Secondary | ICD-10-CM | POA: Diagnosis not present

## 2013-10-14 DIAGNOSIS — Z961 Presence of intraocular lens: Secondary | ICD-10-CM | POA: Diagnosis not present

## 2013-10-14 DIAGNOSIS — H2640 Unspecified secondary cataract: Secondary | ICD-10-CM | POA: Diagnosis not present

## 2013-11-05 ENCOUNTER — Other Ambulatory Visit: Payer: Self-pay | Admitting: Internal Medicine

## 2013-11-20 ENCOUNTER — Other Ambulatory Visit: Payer: Self-pay | Admitting: Internal Medicine

## 2013-11-20 DIAGNOSIS — R700 Elevated erythrocyte sedimentation rate: Secondary | ICD-10-CM | POA: Insufficient documentation

## 2013-11-21 ENCOUNTER — Telehealth: Payer: Self-pay

## 2013-11-21 NOTE — Telephone Encounter (Signed)
Message copied by Shelly Coss on Mon Nov 21, 2013  8:55 AM ------      Message from: Hendricks Limes      Created: Sun Nov 20, 2013  1:50 PM       She should have Sed Rate repeated & have F/U OVif still elevated; it was > 100 on 6/4 ------

## 2013-11-21 NOTE — Telephone Encounter (Signed)
Patient has been advised to have sed rate drawn

## 2013-12-01 ENCOUNTER — Other Ambulatory Visit (INDEPENDENT_AMBULATORY_CARE_PROVIDER_SITE_OTHER): Payer: Medicare Other

## 2013-12-01 DIAGNOSIS — R700 Elevated erythrocyte sedimentation rate: Secondary | ICD-10-CM

## 2013-12-01 LAB — SEDIMENTATION RATE: SED RATE: 47 mm/h — AB (ref 0–22)

## 2013-12-08 ENCOUNTER — Ambulatory Visit (INDEPENDENT_AMBULATORY_CARE_PROVIDER_SITE_OTHER): Payer: Medicare Other | Admitting: Internal Medicine

## 2013-12-08 ENCOUNTER — Encounter: Payer: Self-pay | Admitting: Internal Medicine

## 2013-12-08 VITALS — BP 140/70 | HR 69 | Temp 98.0°F | Wt 107.5 lb

## 2013-12-08 DIAGNOSIS — M2550 Pain in unspecified joint: Secondary | ICD-10-CM | POA: Diagnosis not present

## 2013-12-08 DIAGNOSIS — R7309 Other abnormal glucose: Secondary | ICD-10-CM

## 2013-12-08 DIAGNOSIS — M353 Polymyalgia rheumatica: Secondary | ICD-10-CM | POA: Insufficient documentation

## 2013-12-08 NOTE — Progress Notes (Signed)
Pre visit review using our clinic review tool, if applicable. No additional management support is needed unless otherwise documented below in the visit note. 

## 2013-12-08 NOTE — Progress Notes (Signed)
   Subjective:    Patient ID: Tammy Beard, female    DOB: 01/05/1941, 73 y.o.   MRN: [id number]  HPI She presented//15 with classic findings of polymyalgia rheumatica. Her sedimentation rate was 110. Within 3 days of low-dose prednisone symptoms were gone.  The symptoms have been continued to be quiescent except for minor fatigue despite having completed the prednisone.  Shortly after being seen here she saw  Her Ophthalmologist who found no evidence of temporal arteritis.   Review of Systems    She does not have neck/shoulder or hip girdle pain at this time.  She denies any significant visual changes or headaches.      Objective:   Physical Exam  Pertinent or positive findings include: She is wearing hearing aids bilaterally. She is thin but appears adequately nourished There is accentuated curvature of the thoracic spine Grade 1 systolic murmur is present. 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   Eyes: No conjunctival inflammation or scleral icterus is present. Oral exam: Dental hygiene is good. Lips and gums are healthy appearing.There is no oropharyngeal erythema or exudate noted.  Heart:  Normal rate and regular rhythm. S1 and S2 normal without gallop, click, rub or other extra sounds  Lungs:Chest clear to auscultation; no wheezes, rhonchi,rales ,or rubs present.No increased work of breathing.  Skin:Warm & dry.  Intact without suspicious lesions or rashes ; no jaundice or tenting Lymphatic: No lymphadenopathy is noted about the head, neck, axilla           Assessment & Plan:  #1 arthralgias, multiple joints. Initial presentation was classic for Polymyalgia Rheumatica as well as the high sedimentation rate and response to low-dose steroids. Symptoms have resolved despite discontinuation of the steroids.  At this time her sedimentation rate is mildly elevated  for age at 8.  Plan: Sedimentation rate will be repeated in 10-12 weeks. She is to return should she have significant exacerbation of symptoms.

## 2013-12-08 NOTE — Patient Instructions (Signed)
Your next office appointment will be determined based upon review of your pending repeat sed rate in 8 -10 weeks. Those instructions will be transmitted to you through My Chart . Followup as needed for acute symptoms. Please report any significant change in your symptoms.

## 2014-01-07 DIAGNOSIS — Z23 Encounter for immunization: Secondary | ICD-10-CM | POA: Diagnosis not present

## 2014-02-20 ENCOUNTER — Other Ambulatory Visit (INDEPENDENT_AMBULATORY_CARE_PROVIDER_SITE_OTHER): Payer: Medicare Other

## 2014-02-20 ENCOUNTER — Other Ambulatory Visit: Payer: Self-pay | Admitting: Internal Medicine

## 2014-02-20 DIAGNOSIS — R7309 Other abnormal glucose: Secondary | ICD-10-CM

## 2014-02-20 DIAGNOSIS — M2550 Pain in unspecified joint: Secondary | ICD-10-CM | POA: Diagnosis not present

## 2014-02-20 DIAGNOSIS — M353 Polymyalgia rheumatica: Secondary | ICD-10-CM

## 2014-02-20 LAB — SEDIMENTATION RATE: Sed Rate: 90 mm/hr — ABNORMAL HIGH (ref 0–22)

## 2014-02-20 LAB — HEMOGLOBIN A1C: Hgb A1c MFr Bld: 6.3 % (ref 4.6–6.5)

## 2014-02-20 MED ORDER — PREDNISONE 10 MG PO TABS: ORAL_TABLET | ORAL | Status: DC

## 2014-03-07 ENCOUNTER — Ambulatory Visit (INDEPENDENT_AMBULATORY_CARE_PROVIDER_SITE_OTHER): Payer: Medicare Other | Admitting: Internal Medicine

## 2014-03-07 ENCOUNTER — Encounter: Payer: Self-pay | Admitting: Internal Medicine

## 2014-03-07 VITALS — BP 132/64 | HR 79 | Temp 98.0°F | Wt 107.0 lb

## 2014-03-07 DIAGNOSIS — R011 Cardiac murmur, unspecified: Secondary | ICD-10-CM

## 2014-03-07 DIAGNOSIS — M353 Polymyalgia rheumatica: Secondary | ICD-10-CM

## 2014-03-07 DIAGNOSIS — R7303 Prediabetes: Secondary | ICD-10-CM

## 2014-03-07 DIAGNOSIS — IMO0002 Reserved for concepts with insufficient information to code with codable children: Secondary | ICD-10-CM | POA: Insufficient documentation

## 2014-03-07 DIAGNOSIS — R7309 Other abnormal glucose: Secondary | ICD-10-CM | POA: Diagnosis not present

## 2014-03-07 DIAGNOSIS — E1165 Type 2 diabetes mellitus with hyperglycemia: Secondary | ICD-10-CM

## 2014-03-07 DIAGNOSIS — E1149 Type 2 diabetes mellitus with other diabetic neurological complication: Secondary | ICD-10-CM | POA: Insufficient documentation

## 2014-03-07 MED ORDER — PREDNISONE 5 MG PO TABS: ORAL_TABLET | ORAL | Status: DC

## 2014-03-07 NOTE — Patient Instructions (Signed)
Your next office appointment will be determined based upon review of your pending labs to be done by 05/22/2014. Those instructions will be transmitted to you through My Chart  Followup after labs repeated in February. Please report any significant change in your symptoms.

## 2014-03-07 NOTE — Assessment & Plan Note (Signed)
No SBE Prophylaxis

## 2014-03-07 NOTE — Progress Notes (Signed)
   Subjective:    Patient ID: Tammy Beard, female    DOB: 09/20/1940, 73 y.o.   MRN: [id number]  HPI After the prednisone was discontinued her Sedimentation rate rose to 90. Her brother states that she was complaining of migratory pain in the neck, left posterior thorax as well as the hips. She minimizes musculoskeletal complaints  She was restarted on 15 mg of prednisone with dramatic response.  Because of the prednisone A1c was checked; this was prediabetic with a value of 6.3%  Review of Systems   She denies fever, chills, sweats, rash, or weight loss. In fact she's actually gained a few pounds.  She has had a long-standing heart murmur. She was told that she did not need SBE prophylaxis prior to dental work.  Chest pain, palpitations, tachycardia, exertional dyspnea, paroxysmal nocturnal dyspnea, claudication or edema are absent.       Objective:   Physical Exam   Positive or pertinent findings include: She is thin but appears adequately nourished She has bilateral hearing aids. She has a 1. 5-2 systolic murmur loudest at the left base with radiation into the carotids, left greater than right. There's accentuation of the curvature of the upper thoracic spine  General appearance : in no distress. Eyes: No conjunctival inflammation or scleral icterus is present. Oral exam: Dental hygiene is good. Lips and gums are healthy appearing.There is no oropharyngeal erythema or exudate noted.  Heart:  Normal rate and regular rhythm. S1 and S2 normal without gallop, click, rub or other extra sounds Excellent ;there is no aortic aneurysm present.  NVD @ 10 degrees; no HJR Lungs:Chest clear to auscultation; no wheezes, rhonchi,rales ,or rubs present.No increased work of breathing.  Abdomen: bowel sounds normal, soft and non-tender without masses, organomegaly or hernias noted.  No guarding or rebound. Vascular : all pulses equal ; no bruits present. Skin:Warm & dry.  Intact without  suspicious lesions or rashes ; no jaundice or tenting Lymphatic: No lymphadenopathy is noted about the head, neck, axilla Oriented X 3; affect suddued             Assessment & Plan:  #1 polymyalgia rheumatica; she will complete the 10 mg pills at a dose of 1.5 daily. She will then change to prednisone 5 mg 2.5 pills daily. After 8 weeks; sedimentation rate will be collected  #2 prediabetes. A1c and chemistries be rechecked with the sedimentation rate because of the rise in A1c with steroids.  #3 murmur without active cardio pulmonary symptoms.See data entry in Problem List from 2014  ECHO report  Warning signs were discussed.

## 2014-03-07 NOTE — Progress Notes (Signed)
Pre visit review using our clinic review tool, if applicable. No additional management support is needed unless otherwise documented below in the visit note. 

## 2014-04-03 ENCOUNTER — Other Ambulatory Visit: Payer: Self-pay | Admitting: Internal Medicine

## 2014-04-04 ENCOUNTER — Other Ambulatory Visit: Payer: Self-pay

## 2014-04-04 DIAGNOSIS — L821 Other seborrheic keratosis: Secondary | ICD-10-CM | POA: Diagnosis not present

## 2014-04-04 DIAGNOSIS — L308 Other specified dermatitis: Secondary | ICD-10-CM | POA: Diagnosis not present

## 2014-04-04 DIAGNOSIS — D1801 Hemangioma of skin and subcutaneous tissue: Secondary | ICD-10-CM | POA: Diagnosis not present

## 2014-04-04 DIAGNOSIS — Z85820 Personal history of malignant melanoma of skin: Secondary | ICD-10-CM | POA: Diagnosis not present

## 2014-04-04 DIAGNOSIS — D2239 Melanocytic nevi of other parts of face: Secondary | ICD-10-CM | POA: Diagnosis not present

## 2014-04-04 DIAGNOSIS — Z85828 Personal history of other malignant neoplasm of skin: Secondary | ICD-10-CM | POA: Diagnosis not present

## 2014-04-04 DIAGNOSIS — L814 Other melanin hyperpigmentation: Secondary | ICD-10-CM | POA: Diagnosis not present

## 2014-04-04 DIAGNOSIS — D485 Neoplasm of uncertain behavior of skin: Secondary | ICD-10-CM | POA: Diagnosis not present

## 2014-05-11 ENCOUNTER — Other Ambulatory Visit (INDEPENDENT_AMBULATORY_CARE_PROVIDER_SITE_OTHER): Payer: Medicare Other

## 2014-05-11 DIAGNOSIS — R7309 Other abnormal glucose: Secondary | ICD-10-CM | POA: Diagnosis not present

## 2014-05-11 DIAGNOSIS — R7303 Prediabetes: Secondary | ICD-10-CM

## 2014-05-11 DIAGNOSIS — M353 Polymyalgia rheumatica: Secondary | ICD-10-CM | POA: Diagnosis not present

## 2014-05-11 LAB — HEMOGLOBIN A1C: HEMOGLOBIN A1C: 6.5 % (ref 4.6–6.5)

## 2014-05-11 LAB — SEDIMENTATION RATE: Sed Rate: 47 mm/hr — ABNORMAL HIGH (ref 0–22)

## 2014-05-11 LAB — BASIC METABOLIC PANEL
BUN: 24 mg/dL — ABNORMAL HIGH (ref 6–23)
CO2: 29 meq/L (ref 19–32)
Calcium: 9.3 mg/dL (ref 8.4–10.5)
Chloride: 100 mEq/L (ref 96–112)
Creatinine, Ser: 0.94 mg/dL (ref 0.40–1.20)
GFR: 61.79 mL/min (ref >60.00)
GLUCOSE: 300 mg/dL — AB (ref 70–99)
POTASSIUM: 4.8 meq/L (ref 3.5–5.1)
Sodium: 135 mEq/L (ref 135–145)

## 2014-05-12 ENCOUNTER — Telehealth: Payer: Self-pay

## 2014-05-12 NOTE — Telephone Encounter (Signed)
Recent labs have been mailed

## 2014-05-12 NOTE — Telephone Encounter (Signed)
-----   Message from Hendricks Limes, MD sent at 05/12/2014  6:13 AM EST ----- Please verify that results are being checked through My Chart. If  not using My Chart; results will be mailed & My Chart should be closed.

## 2014-05-12 NOTE — Telephone Encounter (Signed)
Please copy lab report & mail. D/C My Chart please

## 2014-05-12 NOTE — Telephone Encounter (Signed)
Patient's states her computer is not working at this time

## 2014-05-17 ENCOUNTER — Other Ambulatory Visit (INDEPENDENT_AMBULATORY_CARE_PROVIDER_SITE_OTHER): Payer: Medicare Other

## 2014-05-17 ENCOUNTER — Ambulatory Visit (INDEPENDENT_AMBULATORY_CARE_PROVIDER_SITE_OTHER)
Admission: RE | Admit: 2014-05-17 | Discharge: 2014-05-17 | Disposition: A | Discharge status: Home / Self Care | Payer: Medicare Other | Source: Ambulatory Visit | Attending: Internal Medicine | Admitting: Internal Medicine

## 2014-05-17 ENCOUNTER — Ambulatory Visit (INDEPENDENT_AMBULATORY_CARE_PROVIDER_SITE_OTHER): Payer: Medicare Other | Admitting: Internal Medicine

## 2014-05-17 VITALS — BP 122/76 | HR 85 | Temp 98.2°F | Ht 62.0 in | Wt 115.4 lb

## 2014-05-17 DIAGNOSIS — D6489 Other specified anemias: Secondary | ICD-10-CM

## 2014-05-17 DIAGNOSIS — M8580 Other specified disorders of bone density and structure, unspecified site: Secondary | ICD-10-CM | POA: Diagnosis not present

## 2014-05-17 DIAGNOSIS — R7309 Other abnormal glucose: Secondary | ICD-10-CM

## 2014-05-17 DIAGNOSIS — R0789 Other chest pain: Secondary | ICD-10-CM

## 2014-05-17 DIAGNOSIS — M353 Polymyalgia rheumatica: Secondary | ICD-10-CM | POA: Diagnosis not present

## 2014-05-17 DIAGNOSIS — R7303 Prediabetes: Secondary | ICD-10-CM

## 2014-05-17 DIAGNOSIS — R079 Chest pain, unspecified: Secondary | ICD-10-CM | POA: Diagnosis not present

## 2014-05-17 DIAGNOSIS — M6281 Muscle weakness (generalized): Secondary | ICD-10-CM | POA: Diagnosis not present

## 2014-05-17 LAB — CBC WITH DIFFERENTIAL/PLATELET
BASOS ABS: 0 10*3/uL (ref 0.0–0.1)
Basophils Relative: 0.3 % (ref 0.0–3.0)
Eosinophils Absolute: 0 10*3/uL (ref 0.0–0.7)
Eosinophils Relative: 0 % (ref 0.0–5.0)
HEMATOCRIT: 37.3 % (ref 36.0–46.0)
Hemoglobin: 12 g/dL (ref 12.0–15.0)
LYMPHS ABS: 0.7 10*3/uL (ref 0.7–4.0)
LYMPHS PCT: 9.1 % — AB (ref 12.0–46.0)
MCHC: 32.2 g/dL (ref 30.0–36.0)
MCV: 84.3 fl (ref 78.0–100.0)
Monocytes Absolute: 0.2 10*3/uL (ref 0.1–1.0)
Monocytes Relative: 2.6 % — ABNORMAL LOW (ref 3.0–12.0)
NEUTROS ABS: 6.7 10*3/uL (ref 1.4–7.7)
Neutrophils Relative %: 88 % — ABNORMAL HIGH (ref 43.0–77.0)
Platelets: 188 10*3/uL (ref 150.0–400.0)
RBC: 4.43 Mil/uL (ref 3.87–5.11)
RDW: 16.6 % — AB (ref 11.5–15.5)
WBC: 7.6 10*3/uL (ref 4.0–10.5)

## 2014-05-17 LAB — IBC PANEL
IRON: 27 ug/dL — AB (ref 42–145)
Saturation Ratios: 5.6 % — ABNORMAL LOW (ref 20.0–50.0)
TRANSFERRIN: 343 mg/dL (ref 212.0–360.0)

## 2014-05-17 LAB — VITAMIN B12: Vitamin B-12: 254 pg/mL (ref 211–911)

## 2014-05-17 NOTE — Assessment & Plan Note (Signed)
Discordancy between A1c and random glucose A1c can be falsely lowered by conditions with decreased erythrocyte lifespan such as hemolytic anemia, recent blood transfusion, and splenomegaly.  A1c can be falsely elevated with increased erythrocyte lifespan such as seen with splenomegaly.  Erythropoiesis associated with reticulocytosis or erythropoietin administration can artificially  Lower A1c as can severe hypertriglyceridemia.  Glycation can cause falsely lowered A1c with high-dose vitamin C or E   To differentiate these factors a fructosamine test (reference range 200-285 micromol/ L) would be diagnostic.

## 2014-05-17 NOTE — Progress Notes (Signed)
   Subjective:    Patient ID: Tammy Beard, female    DOB: May 25, 1940, 75 y.o.   MRN: [id number]  HPI  She believes she is on 12.5 mg of prednisone daily for polymyalgia rheumatica. Her sedimentation rate was 47 down from a value of 92 months ago.  Nonfasting glucose was 300; A1c was 6.5% which would correlate with an average sugar 154.  She does have some polyuria.  She also has some right inframammary area chest wall pain at night. She takes 2 Tylenol at bedtime and it is resolved by the morning.  She has had anemia in the past. She denies any signs of gastrointestinal bleeding.    Review of Systems She denies polydipsia or polyphagia.  Epistaxis, hemoptysis, hematuria, melena, or rectal bleeding denied.  There is no abnormal bruising , bleeding, or difficulty stopping bleeding with injury.  She also has pain under the left arm to the waist on the left.  She relates the right inframammary pain to a fall remotely.  She describes some pain in the lumbar or sacral areas well as well as the right hip.  She's had some intermittent headaches which have not been significant.    Objective:   Physical Exam  Pertinent positive findings include: Bilateral ptosis. She appears very frail but adequately nourished There is marked situational thoracic curvature. There is no pain with compression of the chest side to side. With the hands held up at chest level she has bilateral tremor. Posterior  tibial pulses are decreased.   General appearance :in no distress. Eyes: No conjunctival inflammation or scleral icterus is present. Heart:  Normal rate and regular rhythm. S1 and S2 normal without gallop, murmur, click, rub or other extra sounds   Lungs:Chest clear to auscultation; no wheezes, rhonchi,rales ,or rubs present.No increased work of breathing.  Abdomen: bowel sounds normal, soft and non-tender without masses, organomegaly or hernias noted.  No guarding or rebound.  No spinal  or flank tenderness to percussion. Vascular : all pulses equal ; no bruits present. Skin:Warm & dry.  Intact without suspicious lesions or rashes ; no jaundice or tenting Lymphatic: No lymphadenopathy is noted about the head, neck, axilla Neuro: Strength, tone normal.        Assessment & Plan:  #1 polymyalgia rheumatica; significant response to low-dose steroids  #2 discordance between random glucose and A1c.  #3 chest wall pain #4 anemia  Plan: Chest x-ray, CBC, bone density, and fructosamine level.

## 2014-05-17 NOTE — Assessment & Plan Note (Signed)
Slow wean of steroids over two years

## 2014-05-17 NOTE — Patient Instructions (Signed)
  Your next office appointment will be determined based upon review of your pending labs &  x-rays. Those instructions will be transmitted to you through My Chart   Critical values will be called. Followup as needed for any active or acute issue. Please report any significant change in your symptoms.

## 2014-05-17 NOTE — Assessment & Plan Note (Signed)
CBC  Iron panel B12 level

## 2014-05-17 NOTE — Progress Notes (Signed)
Pre visit review using our clinic review tool, if applicable. No additional management support is needed unless otherwise documented below in the visit note. 

## 2014-05-18 ENCOUNTER — Other Ambulatory Visit: Payer: Self-pay | Admitting: Internal Medicine

## 2014-05-18 DIAGNOSIS — M353 Polymyalgia rheumatica: Secondary | ICD-10-CM

## 2014-05-18 DIAGNOSIS — D509 Iron deficiency anemia, unspecified: Secondary | ICD-10-CM

## 2014-05-19 ENCOUNTER — Telehealth: Payer: Self-pay | Admitting: Internal Medicine

## 2014-05-19 LAB — FRUCTOSAMINE: FRUCTOSAMINE: 297 umol/L — AB (ref 190–270)

## 2014-05-19 MED ORDER — PREDNISONE 10 MG PO TABS: 10.0000 mg | ORAL_TABLET | Freq: Every day | ORAL | Status: DC

## 2014-05-19 NOTE — Telephone Encounter (Signed)
Phone call to patient. She just needs the Prednisone sent to her pharmacy. This has been done.

## 2014-05-19 NOTE — Telephone Encounter (Signed)
Pt called in said that she thought that she was going to have a med called in depend on her labs but she hasnt heard anything?  She was requesting a call back

## 2014-05-19 NOTE — Telephone Encounter (Signed)
Labs sent through my ; we can mail copy with comments Actually reading remarks in My Chart or on mailed copy is needed as somewhat complex

## 2014-06-05 ENCOUNTER — Telehealth: Payer: Self-pay | Admitting: Gastroenterology

## 2014-06-05 MED ORDER — MESALAMINE 1.2 G PO TBEC: DELAYED_RELEASE_TABLET | ORAL | Status: DC

## 2014-06-05 NOTE — Telephone Encounter (Signed)
Medication sent to your pharmacy  l/m

## 2014-06-06 ENCOUNTER — Other Ambulatory Visit: Payer: Self-pay

## 2014-06-06 MED ORDER — PREDNISONE 10 MG PO TABS: 10.0000 mg | ORAL_TABLET | Freq: Every day | ORAL | Status: DC

## 2014-06-06 NOTE — Telephone Encounter (Signed)
OK X1 

## 2014-06-12 ENCOUNTER — Ambulatory Visit (INDEPENDENT_AMBULATORY_CARE_PROVIDER_SITE_OTHER): Payer: Medicare Other | Admitting: Gastroenterology

## 2014-06-12 DIAGNOSIS — Z23 Encounter for immunization: Secondary | ICD-10-CM | POA: Diagnosis not present

## 2014-06-12 DIAGNOSIS — K5190 Ulcerative colitis, unspecified, without complications: Secondary | ICD-10-CM

## 2014-06-12 DIAGNOSIS — M6281 Muscle weakness (generalized): Secondary | ICD-10-CM | POA: Diagnosis not present

## 2014-06-28 ENCOUNTER — Other Ambulatory Visit: Payer: Self-pay | Admitting: Internal Medicine

## 2014-07-06 ENCOUNTER — Other Ambulatory Visit: Payer: BLUE CROSS/BLUE SHIELD

## 2014-07-10 ENCOUNTER — Other Ambulatory Visit: Payer: Self-pay | Admitting: Internal Medicine

## 2014-07-10 DIAGNOSIS — Z1231 Encounter for screening mammogram for malignant neoplasm of breast: Secondary | ICD-10-CM

## 2014-07-11 ENCOUNTER — Other Ambulatory Visit: Payer: BLUE CROSS/BLUE SHIELD

## 2014-07-17 ENCOUNTER — Inpatient Hospital Stay: Admission: RE | Admit: 2014-07-17 | Payer: BLUE CROSS/BLUE SHIELD | Source: Ambulatory Visit

## 2014-07-18 ENCOUNTER — Ambulatory Visit (HOSPITAL_COMMUNITY)
Admission: RE | Admit: 2014-07-18 | Discharge: 2014-07-18 | Disposition: A | Discharge status: Home / Self Care | Payer: Medicare Other | Source: Ambulatory Visit | Attending: Internal Medicine | Admitting: Internal Medicine

## 2014-07-18 ENCOUNTER — Ambulatory Visit (HOSPITAL_COMMUNITY): Payer: BLUE CROSS/BLUE SHIELD

## 2014-07-18 ENCOUNTER — Other Ambulatory Visit (INDEPENDENT_AMBULATORY_CARE_PROVIDER_SITE_OTHER): Payer: Medicare Other

## 2014-07-18 ENCOUNTER — Ambulatory Visit (INDEPENDENT_AMBULATORY_CARE_PROVIDER_SITE_OTHER)
Admission: RE | Admit: 2014-07-18 | Discharge: 2014-07-18 | Disposition: A | Discharge status: Home / Self Care | Payer: Medicare Other | Source: Ambulatory Visit | Attending: Internal Medicine | Admitting: Internal Medicine

## 2014-07-18 DIAGNOSIS — D509 Iron deficiency anemia, unspecified: Secondary | ICD-10-CM | POA: Diagnosis not present

## 2014-07-18 DIAGNOSIS — M8580 Other specified disorders of bone density and structure, unspecified site: Secondary | ICD-10-CM

## 2014-07-18 DIAGNOSIS — Z1231 Encounter for screening mammogram for malignant neoplasm of breast: Secondary | ICD-10-CM

## 2014-07-18 DIAGNOSIS — M353 Polymyalgia rheumatica: Secondary | ICD-10-CM | POA: Diagnosis not present

## 2014-07-18 LAB — HM MAMMOGRAPHY

## 2014-07-18 LAB — IBC PANEL
IRON: 40 ug/dL — AB (ref 42–145)
SATURATION RATIOS: 10.1 % — AB (ref 20.0–50.0)
TRANSFERRIN: 284 mg/dL (ref 212.0–360.0)

## 2014-07-18 LAB — SEDIMENTATION RATE: Sed Rate: 45 mm/hr — ABNORMAL HIGH (ref 0–22)

## 2014-07-19 ENCOUNTER — Telehealth: Payer: Self-pay | Admitting: Internal Medicine

## 2014-07-19 MED ORDER — PREDNISONE 10 MG PO TABS
10.0000 mg | ORAL_TABLET | Freq: Every day | ORAL | Status: DC
Start: 2014-07-19 — End: 2014-07-20

## 2014-07-19 NOTE — Telephone Encounter (Signed)
I need to see her before refill with the actual pill bottle If she is not getting results through My Chart ;it needs to be closed & results mailed

## 2014-07-19 NOTE — Telephone Encounter (Signed)
Phone call to patient and she has been advised via voicemail. Patient gave the okay to do so

## 2014-07-19 NOTE — Telephone Encounter (Signed)
Pt called in said that she will be out of prednisone this week and wanted to know if Hop wanted to refill them or what she should do?    Best number [phone]

## 2014-07-19 NOTE — Telephone Encounter (Signed)
Phone call to patient. She states she is not able to come in and her last dose is tomorrow

## 2014-07-19 NOTE — Telephone Encounter (Signed)
Refill 15 days of present dose

## 2014-07-20 ENCOUNTER — Encounter: Payer: Self-pay | Admitting: Internal Medicine

## 2014-07-20 ENCOUNTER — Ambulatory Visit (INDEPENDENT_AMBULATORY_CARE_PROVIDER_SITE_OTHER): Payer: Medicare Other | Admitting: Internal Medicine

## 2014-07-20 VITALS — BP 110/74 | HR 84 | Temp 98.1°F | Ht 62.0 in | Wt 115.5 lb

## 2014-07-20 DIAGNOSIS — D509 Iron deficiency anemia, unspecified: Secondary | ICD-10-CM

## 2014-07-20 DIAGNOSIS — M353 Polymyalgia rheumatica: Secondary | ICD-10-CM

## 2014-07-20 DIAGNOSIS — R7309 Other abnormal glucose: Secondary | ICD-10-CM

## 2014-07-20 DIAGNOSIS — R7303 Prediabetes: Secondary | ICD-10-CM

## 2014-07-20 MED ORDER — PREDNISONE 10 MG PO TABS: 10.0000 mg | ORAL_TABLET | Freq: Every day | ORAL | Status: DC

## 2014-07-20 NOTE — Progress Notes (Signed)
Pre visit review using our clinic review tool, if applicable. No additional management support is needed unless otherwise documented below in the visit note. 

## 2014-07-20 NOTE — Patient Instructions (Signed)
Continue 10 mg prednisone daily; have the labs repeated between June 15-30 and see me 2-3 days later.

## 2014-07-20 NOTE — Progress Notes (Signed)
   Subjective:    Patient ID: Tammy Beard, female    DOB: September 08, 1940, 74 y.o.   MRN: [id number]  HPI She's been compliant with prednisone 10 mg daily since March. At this time she essentially has no pain. She typically now has pain in the right side and hip area once a week up to level 3 for several hours. Her sedimentation rate remains stable  @ 45.  She continues a multivitamin with iron. She has no bleeding dyscrasias although she does bruise easily.  Review of Systems Epistaxis, hemoptysis, hematuria, melena, or rectal bleeding denied. No unexplained weight loss, significant dyspepsia,dysphagia, or abdominal pain.  There is no abnormal bleeding, or difficulty stopping bleeding with injury.    Objective:   Physical Exam Pertinent or positive findings include: She is thin but appears adequately nourished.  She has hearing aid bilaterally.  There is accentuated curvature of the upper thoracic spine. There is a grade 1. 5-2 systolic murmur  General appearance :communicative; in no distress. Eyes: No conjunctival inflammation or scleral icterus is present. Oral exam:  Lips and gums are healthy appearing.There is no oropharyngeal erythema or exudate noted. Dental hygiene is good. Heart:  Normal rate and regular rhythm. S1 and S2 normal without gallop,click, rub or other extra sounds  Lungs:Chest clear to auscultation; no wheezes, rhonchi,rales ,or rubs present.No increased work of breathing.  Abdomen: bowel sounds normal, soft and non-tender without masses, organomegaly or hernias noted.  No guarding or rebound.  Vascular : all pulses equal ; no bruits present. Skin:Warm & dry.  Intact without suspicious lesions or rashes ; no tenting or jaundice  Lymphatic: No lymphadenopathy is noted about the head, neck, axilla Neuro: Strength, tone & DTRs normal.       Assessment & Plan:  #1 polymyalgia rheumatica; prednisone 10 mg daily will be continued until mid June. Sedimentation rate at  that time  #2 prediabetes; A1c will be checked before next visit  #3 deficiency anemia. She will continue the multivitamin with iron and recheck iron studies with the sedimentation rate and A1c

## 2014-09-04 ENCOUNTER — Encounter: Payer: Self-pay | Admitting: Gastroenterology

## 2014-09-07 ENCOUNTER — Other Ambulatory Visit (INDEPENDENT_AMBULATORY_CARE_PROVIDER_SITE_OTHER): Payer: Medicare Other

## 2014-09-07 DIAGNOSIS — M353 Polymyalgia rheumatica: Secondary | ICD-10-CM

## 2014-09-07 DIAGNOSIS — R7303 Prediabetes: Secondary | ICD-10-CM

## 2014-09-07 DIAGNOSIS — R7309 Other abnormal glucose: Secondary | ICD-10-CM | POA: Diagnosis not present

## 2014-09-07 DIAGNOSIS — D509 Iron deficiency anemia, unspecified: Secondary | ICD-10-CM | POA: Diagnosis not present

## 2014-09-07 LAB — IBC PANEL
Iron: 71 ug/dL (ref 42–145)
Saturation Ratios: 16.5 % — ABNORMAL LOW (ref 20.0–50.0)
TRANSFERRIN: 308 mg/dL (ref 212.0–360.0)

## 2014-09-07 LAB — SEDIMENTATION RATE: Sed Rate: 39 mm/hr — ABNORMAL HIGH (ref 0–22)

## 2014-09-07 LAB — HEMOGLOBIN A1C: Hgb A1c MFr Bld: 5.9 % (ref 4.6–6.5)

## 2014-09-11 ENCOUNTER — Other Ambulatory Visit: Payer: Self-pay | Admitting: Internal Medicine

## 2014-09-11 DIAGNOSIS — M353 Polymyalgia rheumatica: Secondary | ICD-10-CM

## 2014-09-11 MED ORDER — PREDNISONE 10 MG PO TABS
ORAL_TABLET | ORAL | Status: DC
Start: 2014-09-11 — End: 2014-09-13

## 2014-09-12 ENCOUNTER — Telehealth: Payer: Self-pay | Admitting: Emergency Medicine

## 2014-09-12 NOTE — Telephone Encounter (Signed)
Pt stated she would continue to use my chart. Results also mailed.

## 2014-09-13 ENCOUNTER — Encounter: Payer: Self-pay | Admitting: Internal Medicine

## 2014-09-13 ENCOUNTER — Ambulatory Visit (INDEPENDENT_AMBULATORY_CARE_PROVIDER_SITE_OTHER): Payer: Medicare Other | Admitting: Internal Medicine

## 2014-09-13 VITALS — BP 142/70 | HR 78 | Temp 98.2°F | Resp 16 | Wt 118.0 lb

## 2014-09-13 DIAGNOSIS — M353 Polymyalgia rheumatica: Secondary | ICD-10-CM | POA: Diagnosis not present

## 2014-09-13 DIAGNOSIS — R7309 Other abnormal glucose: Secondary | ICD-10-CM | POA: Diagnosis not present

## 2014-09-13 DIAGNOSIS — R7303 Prediabetes: Secondary | ICD-10-CM

## 2014-09-13 DIAGNOSIS — D509 Iron deficiency anemia, unspecified: Secondary | ICD-10-CM

## 2014-09-13 MED ORDER — PREDNISONE 5 MG PO TABS: ORAL_TABLET | ORAL | Status: DC

## 2014-09-13 NOTE — Progress Notes (Signed)
   Subjective:    Patient ID: Tammy Beard, female    DOB: 03-Jan-1941, 74 y.o.   MRN: [id number]  HPI She is here for follow-up of her polymyalgia rheumatica which is being treated with oral prednisone as well as hyperglycemia and history of iron deficiency anemia.  She's been compliant with her medicines with no adverse effects. She has no musculoskeletal pain, swelling,or joint redness. She denies any constitutional symptoms.  Recent labs were reviewed. She is felt to be at goal allowing further weaning of the oral spheroids.   Review of Systems She denies polyuria, polydipsia, polyphagia. She has no extremity numbness, tingling, burning. She also denies any nonhealing skin lesions.    Objective:   Physical Exam  Pertinent or positive findings include: Grade 1/6 systolic murmurs present. There is marked a accentuation of upper thoracic curvature. Abdomen is doughy and slightly distended. She has mild osteophytic change in the hands.  General appearance :adequately nourished; in no distress.  Eyes: No conjunctival inflammation or scleral icterus is present.  Oral exam:  Lips and gums are healthy appearing.There is no oropharyngeal erythema or exudate noted. Dental hygiene is good.  Heart:  Normal rate and regular rhythm. S1 and S2 normal without gallop, click, rub or other extra sounds    Lungs:Chest clear to auscultation; no wheezes, rhonchi,rales ,or rubs present.No increased work of breathing.   Abdomen: bowel sounds normal, soft and non-tender without masses, organomegaly or hernias noted.  No guarding or rebound.   Vascular : all pulses equal ; no bruits present.  Skin:Warm & dry.  Intact without suspicious lesions or rashes ; no tenting or jaundice   Lymphatic: No lymphadenopathy is noted about the head, neck, axilla  Neuro: Strength, tone & DTRs normal.        Assessment & Plan:  #1 polymyalgia rheumatica  Plan: Prednisone will be decreased to 7.5 mg with  subsequent 2.5 mg decreases every 3 months if she is stable. This would result in a 24 month treatment plan.

## 2014-09-13 NOTE — Patient Instructions (Signed)
  The prednisone will be decreased to 7.5 mg daily for 3 months and then the sedimentation rate repeated. As long you are stable and the sedimentation rate does not rise; the prednisone will be decreased by 2.5 mg every 3 months. Hopefully it can be discontinued totally a year from now.

## 2014-09-13 NOTE — Progress Notes (Signed)
Pre visit review using our clinic review tool, if applicable. No additional management support is needed unless otherwise documented below in the visit note. 

## 2014-09-18 ENCOUNTER — Other Ambulatory Visit: Payer: Self-pay

## 2014-10-23 DIAGNOSIS — H52201 Unspecified astigmatism, right eye: Secondary | ICD-10-CM | POA: Diagnosis not present

## 2014-10-23 DIAGNOSIS — H1789 Other corneal scars and opacities: Secondary | ICD-10-CM | POA: Diagnosis not present

## 2014-10-23 DIAGNOSIS — Z961 Presence of intraocular lens: Secondary | ICD-10-CM | POA: Diagnosis not present

## 2014-10-23 DIAGNOSIS — H5500 Unspecified nystagmus: Secondary | ICD-10-CM | POA: Diagnosis not present

## 2014-10-23 LAB — HM DIABETES EYE EXAM

## 2014-10-30 ENCOUNTER — Encounter: Payer: Self-pay | Admitting: Internal Medicine

## 2014-11-23 ENCOUNTER — Other Ambulatory Visit: Payer: Self-pay | Admitting: Emergency Medicine

## 2014-11-23 ENCOUNTER — Telehealth: Payer: Self-pay | Admitting: Internal Medicine

## 2014-11-23 MED ORDER — RANITIDINE HCL 150 MG PO TABS
150.0000 mg | ORAL_TABLET | Freq: Two times a day (BID) | ORAL | Status: DC
Start: 2014-11-23 — End: 2015-05-01

## 2014-11-23 NOTE — Telephone Encounter (Signed)
Patient is requesting for ranitidine (ZANTAC) 150 MG tablet [[id number]] to be filled @ [facility] on [address]

## 2014-12-06 ENCOUNTER — Telehealth: Payer: Self-pay | Admitting: Internal Medicine

## 2014-12-06 NOTE — Telephone Encounter (Signed)
Pt called in and said she has few questions for [facility] nurse.  Lovena Le can you call her when you get chance.   Best number [phone]

## 2014-12-06 NOTE — Telephone Encounter (Signed)
Pt called back and stated that she is going to run out of predniSONE (DELTASONE) 5 MG tablet this Saturday and she was wondering if she can get that send in to [facility]. Pt is aware that she suppose to see Dr. Donivan Scull and she will make that app. Please call pt ASAP to let her know if this is [facility]

## 2014-12-07 ENCOUNTER — Other Ambulatory Visit (INDEPENDENT_AMBULATORY_CARE_PROVIDER_SITE_OTHER): Payer: Medicare Other

## 2014-12-07 DIAGNOSIS — R7303 Prediabetes: Secondary | ICD-10-CM

## 2014-12-07 DIAGNOSIS — R7309 Other abnormal glucose: Secondary | ICD-10-CM | POA: Diagnosis not present

## 2014-12-07 DIAGNOSIS — M353 Polymyalgia rheumatica: Secondary | ICD-10-CM

## 2014-12-07 DIAGNOSIS — D509 Iron deficiency anemia, unspecified: Secondary | ICD-10-CM | POA: Diagnosis not present

## 2014-12-07 LAB — IBC PANEL
Iron: 43 ug/dL (ref 42–145)
Saturation Ratios: 10.4 % — ABNORMAL LOW (ref 20.0–50.0)
Transferrin: 295 mg/dL (ref 212.0–360.0)

## 2014-12-07 LAB — HEMOGLOBIN A1C: HEMOGLOBIN A1C: 5.9 % (ref 4.6–6.5)

## 2014-12-07 LAB — SEDIMENTATION RATE: Sed Rate: 50 mm/hr — ABNORMAL HIGH (ref 0–22)

## 2014-12-07 NOTE — Telephone Encounter (Signed)
LVM for pt to call back. Pt will need to come in for labs before sending in a refill for Prednisone to see if the dosage needs to be refilled. Orders are in, pt just needs to go to lab and we will send in refill once lab results are back.

## 2014-12-08 ENCOUNTER — Other Ambulatory Visit: Payer: Self-pay | Admitting: Emergency Medicine

## 2014-12-08 ENCOUNTER — Telehealth: Payer: Self-pay | Admitting: Internal Medicine

## 2014-12-08 DIAGNOSIS — M353 Polymyalgia rheumatica: Secondary | ICD-10-CM

## 2014-12-08 MED ORDER — PREDNISONE 5 MG PO TABS: ORAL_TABLET | ORAL | Status: DC

## 2014-12-08 MED ORDER — BENAZEPRIL-HYDROCHLOROTHIAZIDE 20-12.5 MG PO TABS: ORAL_TABLET | ORAL | Status: DC

## 2014-12-08 NOTE — Telephone Encounter (Signed)
clarified

## 2014-12-08 NOTE — Telephone Encounter (Signed)
Setal called from [facility] want to clearify predniSONE (DELTASONE) 5 MG tablet direction. Setal said she normally take 1.5 tab daily but the one we send in was .5 daily? Please call them back.

## 2014-12-08 NOTE — Telephone Encounter (Signed)
Please call patient before 230 today. She went for labs yesterday and needs prednisone.

## 2014-12-21 DIAGNOSIS — Z23 Encounter for immunization: Secondary | ICD-10-CM | POA: Diagnosis not present

## 2014-12-28 ENCOUNTER — Telehealth: Payer: Self-pay

## 2014-12-28 NOTE — Telephone Encounter (Signed)
Received Rx refill for Lialda 1.2gm. Called pt to instruct her to pick a new physician. Left vm for pt to call back.

## 2014-12-29 ENCOUNTER — Other Ambulatory Visit: Payer: Self-pay

## 2014-12-29 MED ORDER — MESALAMINE 1.2 G PO TBEC: DELAYED_RELEASE_TABLET | ORAL | Status: DC

## 2014-12-29 NOTE — Telephone Encounter (Signed)
-----   Message from [name], MD sent at 12/29/2014 10:23 AM EDT ----- Regarding: RE: Lialda Refill Ok to refill ----- Message -----    From: Margreta Journey, CMA    Sent: 12/29/2014   8:58 AM      To: [name], MD Subject: Lialda Refill                                  Patient was a Dr. Deatra Ina patient and she is now scheduled with you on 02/23/3015 at 2:45pm. She needs a refill of Lialda until her appointment. Is it ok to fill it?

## 2014-12-29 NOTE — Telephone Encounter (Signed)
Patient has scheduled appt with Dr Silverio Decamp. I sent Dr. Silverio Decamp a message to see if it is ok to refill Lialda.

## 2014-12-29 NOTE — Telephone Encounter (Signed)
Called pt and informed her that we have refilled her Lialda only until her appointment with Dr. Silverio Decamp for 02/23/2015. Patient understands

## 2015-01-10 ENCOUNTER — Other Ambulatory Visit (INDEPENDENT_AMBULATORY_CARE_PROVIDER_SITE_OTHER): Payer: Medicare Other

## 2015-01-10 ENCOUNTER — Telehealth: Payer: Self-pay | Admitting: Internal Medicine

## 2015-01-10 DIAGNOSIS — M353 Polymyalgia rheumatica: Secondary | ICD-10-CM

## 2015-01-10 LAB — SEDIMENTATION RATE: Sed Rate: 46 mm/hr — ABNORMAL HIGH (ref 0–22)

## 2015-01-10 NOTE — Telephone Encounter (Signed)
Pt came in to have Sed rate checked. Labs have been entered.

## 2015-01-18 ENCOUNTER — Ambulatory Visit (INDEPENDENT_AMBULATORY_CARE_PROVIDER_SITE_OTHER): Payer: Medicare Other | Admitting: Internal Medicine

## 2015-01-18 ENCOUNTER — Ambulatory Visit: Payer: BLUE CROSS/BLUE SHIELD | Admitting: Internal Medicine

## 2015-01-18 ENCOUNTER — Encounter: Payer: Self-pay | Admitting: Internal Medicine

## 2015-01-18 VITALS — BP 142/84 | HR 74 | Temp 97.9°F | Resp 16 | Wt 121.0 lb

## 2015-01-18 DIAGNOSIS — M5414 Radiculopathy, thoracic region: Secondary | ICD-10-CM

## 2015-01-18 DIAGNOSIS — M353 Polymyalgia rheumatica: Secondary | ICD-10-CM | POA: Diagnosis not present

## 2015-01-18 MED ORDER — PREDNISONE 5 MG PO TABS: ORAL_TABLET | ORAL | Status: DC

## 2015-01-18 NOTE — Progress Notes (Signed)
Pre visit review using our clinic review tool, if applicable. No additional management support is needed unless otherwise documented below in the visit note. 

## 2015-01-18 NOTE — Patient Instructions (Signed)
Polymyalgia rheumatica is typically treated for 24 months, weaning the prednisone dose every 3 months if symptoms are controlled and sedimentation rate is stable.  During the month of November 2016 please take 5 mg prednisone 1-1/2 pills daily.   In December 2016; January 2017; in February 2017 please take 5 mg or 1 pill daily. A sedimentation rate should be done in mid to late January to verify the conditions above are met.  Hopefully in March, April, May 2017 you could be reduced to 2.5 mg daily if Dr. Green feels this is appropriate.  As of June 2017 dose may be able to be reduced to 2.5 mg every other day for 3 months and then discontinue the prednisone totally. 

## 2015-01-18 NOTE — Progress Notes (Signed)
   Subjective:    Patient ID: Tammy Beard, female    DOB: 09-25-1940, 74 y.o.   MRN: [id number]  HPI  In the last 2 weeks she describes episodes of discomfort in the right lateral chest and abdomen which is responsive to Tylenol. It is intermittent and worse with sitting. She has no associated cardiac, pulmonary, GI, or genitourinary symptoms.  She's had no symptoms which mimic the original polymyalgia rheumatica symptomatology.  Sed rate results and long term plan for PMR reviewed.  Review of Systems  There is no significant cough, sputum production,hemoptysis, wheezing,or  paroxysmal nocturnal dyspnea. Unexplained weight loss, abdominal pain, significant dyspepsia, dysphagia, melena, rectal bleeding, or persistently small caliber stools are not present. Dysuria, pyuria, hematuria, frequency, nocturia or polyuria are denied.    Objective:   Physical Exam Pertinent or positive findings include: She appears frail but adequately nourished. She has bilateral hearing aids. There is accentuated curvature of the upper thoracic spine. She has a grade 1/2-systolic murmur. She has minor crepitus of the knees.   General appearance :adequately nourished; in no distress.  Eyes: No conjunctival inflammation or scleral icterus is present.  Oral exam:  Lips and gums are healthy appearing.There is no oropharyngeal erythema or exudate noted. Dental hygiene is good.  Heart:  Normal rate and regular rhythm. S1 and S2 normal without gallop, click, rub or other extra sounds    Lungs:Chest clear to auscultation; no wheezes, rhonchi,rales ,or rubs present.No increased work of breathing.   Abdomen: bowel sounds normal, soft and non-tender without masses, organomegaly or hernias noted.  No guarding or rebound. No flank tenderness to percussion.  Vascular : all pulses equal ; no bruits present.  Skin:Warm & dry.  Intact without suspicious lesions or rashes ; no tenting or jaundice   Lymphatic: No  lymphadenopathy is noted about the head, neck, axilla.   Neuro: Strength, tone decreased. DTRs normal.      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

## 2015-01-23 ENCOUNTER — Non-Acute Institutional Stay: Payer: Medicare Other | Admitting: Internal Medicine

## 2015-01-23 ENCOUNTER — Encounter: Payer: Self-pay | Admitting: Internal Medicine

## 2015-01-23 VITALS — BP 142/72 | HR 83 | Temp 97.5°F | Resp 20 | Ht 62.0 in | Wt 123.0 lb

## 2015-01-23 DIAGNOSIS — K5190 Ulcerative colitis, unspecified, without complications: Secondary | ICD-10-CM

## 2015-01-23 DIAGNOSIS — E785 Hyperlipidemia, unspecified: Secondary | ICD-10-CM | POA: Diagnosis not present

## 2015-01-23 DIAGNOSIS — R7303 Prediabetes: Secondary | ICD-10-CM

## 2015-01-23 DIAGNOSIS — I10 Essential (primary) hypertension: Secondary | ICD-10-CM | POA: Diagnosis not present

## 2015-01-23 DIAGNOSIS — M6208 Separation of muscle (nontraumatic), other site: Secondary | ICD-10-CM | POA: Diagnosis not present

## 2015-01-23 DIAGNOSIS — Z8612 Personal history of poliomyelitis: Secondary | ICD-10-CM | POA: Diagnosis not present

## 2015-01-23 NOTE — Progress Notes (Signed)
Patient ID: Tammy Beard, female   DOB: 11-09-1940, 74 y.o.   MRN: [id number]    HISTORY AND PHYSICAL  Location:  [facility] [phone])   Extended Emergency Contact Information Primary Emergency Contact: [name]) Address: [place], [place] [address] [name] of [place] Phone: [phone] Mobile Phone: [phone] Relation: Brother Secondary Emergency Contact: [name] [place] of [place] Phone: [phone] Relation: Sister  Advanced Directive information Does patient have an advance directive?: Yes, Type of Advance Directive: Healthcare Power of [place];Living will;Out of facility DNR (pink MOST or yellow form), Does patient want to make changes to advanced directive?: No - Patient declined  Chief Complaint  Patient presents with  . Establish Care  . Medical Management of Chronic Issues    needs Prevnar script,    HPI:  Essential hypertension - controlled  Ulcerative colitis without complications, unspecified location [facility]) -asymptomatic at present. Continues on mesalamine.   Pre-diabetes - last glucose in February 2016 was high at 300 mg percent. Hemoglobin A1c at that time was normal at 5.9. Fructosamine was mildly elevated at 297 (normal range 190-270). She has not had additional lab recently.  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 Ulcerative colitis   . Osteopenia     s/p 7 yrs Fosamax  . Hypertension   . Hyperlipidemia   . Sleep disorder   . GERD (gastroesophageal reflux disease)   . Heart murmur   . Melanoma ([place])   . Polio   . Colitis   . Rheumatic fever     Past Surgical History  Procedure Laterality Date  . Breast lumpectomy  1986    left; benign  . Neck surgery  1986    Benign growth on neck removed  . Dilation and  curettage of uterus  12/1999  . Colonoscopy  2006  . Tonsillectomy    . Melanoma excision  2007    left arm  . Cataract extraction      Left eye   . Colonosopy  07/05/13    Dr. Deatra Ina    Patient Care Team: [name], MD as PCP - General (Internal Medicine) [name], MD as Consulting Physician (Cardiology) [name], MD as Consulting Physician (Ophthalmology) [name], MD as Consulting Physician (Gastroenterology) [name], MD as Consulting Physician (Dermatology)  Social History   Social History  . Marital Status: Single    Spouse Name: N/A  . Number of Children: N/A  . Years of Education: N/A   Occupational History  . retired    Social History Main Topics  . Smoking status: Never Smoker   . Smokeless tobacco: Never Used  . Alcohol Use: Yes     Comment: occasional  . Drug Use: No  . Sexual Activity: Not on file   Other Topics Concern  . Not on file   Social History Narrative   Daily Caffeine use- soda (diet)   Patient gets regular exercise         Diet: None      Do you drink/ eat things with caffeine? Yes, sometimes      Marital status:    Never  What year were you married ?      Do you live in a house, apartment,assistred living, condo, trailer, etc.)? [employer].      Is it one or more stories? Multilevel       How many persons live in your home ?  N/A      Do you have any pets in your home ?(please list) No      Current or past profession: Driver for Social worker      Do you exercise?    No                          Type & how often:      Do you have a living will? Yes      Do you have a DNR form?                       If not, do you want to discuss one?       Do you have signed POA?HPOA forms?                 If so, please bring to your        appointment          reports that she has never smoked. She has never used smokeless tobacco. She reports that she drinks alcohol. She  reports that she does not use illicit drugs.  Family History  Problem Relation Age of Onset  . Diabetes Paternal Grandmother   . Cancer Paternal Aunt     unaware of primary site  . Other Mother     Coad/ Bronchiectosis  . Coronary artery disease Neg Hx   . Colon cancer Neg Hx   . Rectal cancer Neg Hx   . Stomach cancer Neg Hx   . Pancreatic cancer Other    Family Status  Relation Status Death Age  . Other Alive   . Brother Alive   . Father Alive   . Mother Deceased   . Sister Alive   . Sister Alive     Immunization History  Administered Date(s) Administered  . Hep A / Hep B 06/08/2013, 06/15/2013, 06/29/2013, 06/12/2014  . Influenza Split 01/01/2014  . Influenza Whole 01/17/2009, 12/02/2010  . Influenza-Unspecified 12/22/2012, 12/22/2013, 12/21/2014  . Pneumococcal Polysaccharide-23 07/25/2009  . Td 07/25/2009  . Tdap 12/22/2011    No Known Allergies  Medications: Patient's Medications  New Prescriptions   No medications on file  Previous Medications   ACETAMINOPHEN (TYLENOL) 325 MG TABLET    Take 650 mg by mouth every 6 (six) hours as needed.   BENAZEPRIL-HYDROCHLORTHIAZIDE (LOTENSIN HCT) 20-12.5 MG PER TABLET    TAKE 1/2 TABLET EVERY MORNING.   CALCIUM CARBONATE-VITAMIN D (CALCIUM + D PO)    Take by mouth daily.   CETIRIZINE (ZYRTEC) 10 MG TABLET    Take 10 mg by mouth as needed. Allergies.   FLUTICASONE (FLONASE) 50 MCG/ACT NASAL SPRAY    Place 1 spray into the nose 2 (two) times daily as needed for rhinitis.   MESALAMINE (LIALDA) 1.2 G EC TABLET    take 2 tablets by mouth daily with BREAKFAST   MULTIPLE VITAMIN (MULTIVITAMIN) TABLET    Take 1 tablet by mouth daily.     PREDNISONE (DELTASONE) 5 MG TABLET    Take 1 1/2 tablet daily during 01/2015 then take 5 mg daily 12/16 through 04/2015   RANITIDINE (ZANTAC)   150 MG TABLET    Take 1 tablet (150 mg total) by mouth 2 (two) times daily.  Modified Medications   No medications on file  Discontinued Medications   No  medications on file    Review of Systems  Constitutional: Negative for fever, chills, diaphoresis, activity change, appetite change, fatigue and unexpected weight change.  HENT: Negative for congestion, ear discharge, ear pain, hearing loss, postnasal drip, rhinorrhea, sore throat, tinnitus, trouble swallowing and voice change.   Eyes: Positive for visual disturbance (corrective lenses). Negative for pain, redness and itching.  Respiratory: Negative for cough, choking, shortness of breath and wheezing.   Cardiovascular: Negative for chest pain, palpitations and leg swelling.  Gastrointestinal: Negative for nausea, abdominal pain, diarrhea, constipation and abdominal distention.  Endocrine: Negative for cold intolerance, heat intolerance, polydipsia, polyphagia and polyuria.       History of elevated glucose.  Genitourinary: Negative for dysuria, urgency, frequency, hematuria, flank pain, vaginal discharge, difficulty urinating and pelvic pain.  Musculoskeletal: Positive for back pain. Negative for myalgias, arthralgias, gait problem, neck pain and neck stiffness.       History of osteopenia and kyphosis  Skin: Negative for color change, pallor and rash.  Allergic/Immunologic: Negative.   Neurological: Negative for dizziness, tremors, seizures, syncope, weakness, numbness and headaches.       History of poliomyelitis at age 68  Hematological: Negative for adenopathy. Does not bruise/bleed easily.  Psychiatric/Behavioral: Negative for suicidal ideas, hallucinations, behavioral problems, confusion, sleep disturbance, dysphoric mood and agitation. The patient is not nervous/anxious and is not hyperactive.     Filed Vitals:   01/23/15 1005  BP: 142/72  Pulse: 83  Temp: 97.5 F (36.4 C)  TempSrc: Oral  Resp: 20  Height: 5' 2" (1.575 m)  Weight: 123 lb (55.792 kg)  SpO2: 96%   Body mass index is 22.49 kg/(m^2).  Physical Exam  Constitutional: She is oriented to person, place, and time.  She appears well-developed and well-nourished. No distress.  HENT:  Right Ear: External ear normal.  Left Ear: External ear normal.  Nose: Nose normal.  Mouth/Throat: Oropharynx is clear and moist. No oropharyngeal exudate.  Eyes: Conjunctivae and EOM are normal. Pupils are equal, round, and reactive to light. No scleral icterus.  Prescription lenses  Neck: No JVD present. No tracheal deviation present. No thyromegaly present.  Cardiovascular: Normal rate, regular rhythm, normal heart sounds and intact distal pulses.  Exam reveals no gallop and no friction rub.   No murmur heard. Pulmonary/Chest: Effort normal. No respiratory distress. She has no wheezes. She has no rales. She exhibits no tenderness.  Abdominal: She exhibits no distension and no mass. There is no tenderness.  Large diastasis recti  Musculoskeletal: Normal range of motion. She exhibits no edema or tenderness.  Kyphosis  Lymphadenopathy:    She has no cervical adenopathy.  Neurological: She is alert and oriented to person, place, and time. No cranial nerve deficit. Coordination normal.  Mild tremor of extended arms  Skin: No rash noted. She is not diaphoretic. No erythema. No pallor.  Psychiatric: She has a normal mood and affect. Her behavior is normal. Judgment and thought content normal.    Labs reviewed: Lab Summary Latest Ref Rng 05/17/2014 05/11/2014  Hemoglobin 12.0 - 15.0 g/dL 12.0 (None)  Hematocrit 36.0 - 46.0 % 37.3 (None)  White count 4.0 - 10.5 K/uL 7.6 (None)  Platelet count 150.0 - 400.0 K/uL 188.0 (None)  Sodium 135 - 145 mEq/L (None) 135  Potassium 3.5 -  5.1 mEq/L (None) 4.8  Calcium 8.4 - 10.5 mg/dL (None) 9.3  Phosphorus - (None) (None)  Creatinine 0.40 - 1.20 mg/dL (None) 0.94  AST - (None) (None)  Alk Phos - (None) (None)  Bilirubin - (None) (None)  Glucose 70 - 99 mg/dL (None) 300(H)  Cholesterol - (None) (None)  HDL cholesterol - (None) (None)  Triglycerides - (None) (None)  LDL Direct -  (None) (None)  LDL Calc - (None) (None)  Total protein - (None) (None)  Albumin - (None) (None)   Lab Results  Component Value Date   BUN 24* 05/11/2014   Lab Results  Component Value Date   HGBA1C 5.9 12/07/2014   Lab Results  Component Value Date   TSH 0.68 08/25/2013    Assessment/Plan  1. Essential hypertension Controlled  2. Ulcerative colitis without complications, unspecified location ([place]) Asymptomatic on mesalamine  3. Pre-diabetes -CMP, future  4. Dyslipidemia -Lipid panel, future  5. 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 Diastasis recti Chronic and unchanged

## 2015-01-24 ENCOUNTER — Encounter: Payer: Self-pay | Admitting: *Deleted

## 2015-01-27 DIAGNOSIS — Z23 Encounter for immunization: Secondary | ICD-10-CM | POA: Diagnosis not present

## 2015-02-23 ENCOUNTER — Ambulatory Visit (INDEPENDENT_AMBULATORY_CARE_PROVIDER_SITE_OTHER): Payer: Medicare Other | Admitting: Gastroenterology

## 2015-02-23 ENCOUNTER — Encounter: Payer: Self-pay | Admitting: Gastroenterology

## 2015-02-23 VITALS — BP 130/74 | HR 68 | Ht 61.0 in | Wt 122.1 lb

## 2015-02-23 DIAGNOSIS — K5180 Other ulcerative colitis without complications: Secondary | ICD-10-CM | POA: Diagnosis not present

## 2015-02-23 MED ORDER — MESALAMINE 1.2 G PO TBEC: DELAYED_RELEASE_TABLET | ORAL | Status: DC

## 2015-02-23 NOTE — Progress Notes (Signed)
OLIE TEAT    NX:[id number]    November 01, 1940  Primary Care Physician:GREEN, Viviann Spare, MD  Referring Physician: Hendricks Limes, MD [place], [address] [address]  Chief complaint:  Ulcerative colitis  HPI: 74 year old female diagnosed with left-sided ulcerative colitis in 2006 on maintenance therapy with Lialda here for follow-up visit. She's been in remission for many years now, her last colonoscopy in April 2014 was normal. She is on low-dose prednisone 5 mg daily for polymyalgia rheumatica. Patient has no complaints at today's visit. Denies diarrhea, blood in stool or abdominal pain.   Outpatient Encounter Prescriptions as of 02/23/2015  Medication Sig  . acetaminophen (TYLENOL) 325 MG tablet Take 650 mg by mouth every 6 (six) hours as needed.  . benazepril-hydrochlorthiazide (LOTENSIN HCT) 20-12.5 MG per tablet TAKE 1/2 TABLET EVERY MORNING.  . Calcium Carbonate-Vitamin D (CALCIUM + D PO) Take by mouth daily.  . cetirizine (ZYRTEC) 10 MG tablet Take 10 mg by mouth as needed. Allergies.  . mesalamine (LIALDA) 1.2 G EC tablet take 2 tablets by mouth daily with BREAKFAST  . Multiple Vitamin (MULTIVITAMIN) tablet Take 1 tablet by mouth daily.    . predniSONE (DELTASONE) 5 MG tablet Take 1 1/2 tablet daily during 01/2015 then take 5 mg daily 12/16 through 04/2015  . ranitidine (ZANTAC) 150 MG tablet Take 1 tablet (150 mg total) by mouth 2 (two) times daily.  . [DISCONTINUED] mesalamine (LIALDA) 1.2 G EC tablet take 2 tablets by mouth daily with BREAKFAST  . [DISCONTINUED] fluticasone (FLONASE) 50 MCG/ACT nasal spray Place 1 spray into the nose 2 (two) times daily as needed for rhinitis. (Patient not taking: Reported on 01/23/2015)   No facility-administered encounter medications on file as of 02/23/2015.    Allergies as of 02/23/2015  . (No Known Allergies)    Past Medical History  Diagnosis Date  . Ulcerative colitis   . Osteopenia     s/p 7 yrs Fosamax  .  Hypertension   . Hyperlipidemia   . Sleep disorder   . GERD (gastroesophageal reflux disease)   . Heart murmur   . Melanoma ([place])   . Polio   . Colitis   . Rheumatic fever   . Polymyalgia [facility])     Past Surgical History  Procedure Laterality Date  . Breast lumpectomy  1986    left; benign  . Neck surgery  1986    Benign growth on neck removed  . Dilation and curettage of uterus  12/1999  . Colonoscopy  2006  . Tonsillectomy    . Melanoma excision  2007    left arm  . Cataract extraction      Left eye   . Colonosopy  07/05/13    Dr. Deatra Ina    Family History  Problem Relation Age of Onset  . Diabetes Paternal Grandmother   . Cancer Paternal Aunt     unaware of primary site  . Other Mother     Coad/ Bronchiectosis  . Coronary artery disease Neg Hx   . Colon cancer Neg Hx   . Rectal cancer Neg Hx   . Stomach cancer Neg Hx   . Pancreatic cancer Other     Social History   Social History  . Marital Status: Single    Spouse Name: N/A  . Number of Children: N/A  . Years of Education: N/A   Occupational History  . retired    Social History  Main Topics  . Smoking status: Never Smoker   . Smokeless tobacco: Never Used  . Alcohol Use: Yes     Comment: occasional  . Drug Use: No  . Sexual Activity: Not on file   Other Topics Concern  . Not on file   Social History Narrative   Daily Caffeine use- soda (diet)   Patient gets regular exercise         Diet: None      Do you drink/ eat things with caffeine? Yes, sometimes      Marital status:    Never                           What year were you married ?      Do you live in a house, apartment,assistred living, condo, trailer, etc.)? [employer].      Is it one or more stories? Multilevel       How many persons live in your home ?  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    No                           Type & how often:      Do you have a living will? Yes      Do you have a DNR form?                       If not, do you want to discuss one?       Do you have signed POA?HPOA forms?                 If so, please bring to your        appointment            Review of systems: Review of Systems  Constitutional: Negative for fever and chills.  HENT: Negative.   Eyes: Negative for blurred vision.  Respiratory: Negative for cough, shortness of breath and wheezing.   Cardiovascular: Negative for chest pain and palpitations.  Gastrointestinal: as per HPI Genitourinary: Negative for dysuria, urgency, frequency and hematuria.  Musculoskeletal: Negative for myalgias, back pain and joint pain.  Skin: Negative for itching and rash.  Neurological: Negative for dizziness, tremors, focal weakness, seizures and loss of consciousness.  Endo/Heme/Allergies: Negative for environmental allergies.  Psychiatric/Behavioral: Negative for depression, suicidal ideas and hallucinations.  All other systems reviewed and are negative.   Physical Exam: Filed Vitals:   02/23/15 1544  BP: 130/74  Pulse: 68   Gen:      No acute distress HEENT:  EOMI, sclera anicteric Neck:     No masses; no thyromegaly Lungs:    Clear to auscultation bilaterally; normal respiratory effort CV:         Regular rate and rhythm; no murmurs Abd:      + bowel sounds; soft, non-tender; no palpable masses, no distension Ext:    No edema; adequate peripheral perfusion Skin:      Warm and dry; no rash Neuro: alert and oriented x 3 Psych: normal mood and affect  Data Reviewed:  Colonoscopy 06/1012 Normal   Assessment and Plan/Recommendations:  74 year old female with history of polymyalgia rheumatica on low-dose  prednisone and left-sided ulcerative colitis diagnosed in 2006 in remission on maintenance therapy with Lialda is here for follow-up visit Continue Lialda Due for surveillance colonoscopy in a year Return as  needed  K. Denzil Magnuson , MD [phone] Mon-Fri 8a-5p [phone] after 5p, weekends, holidays

## 2015-02-23 NOTE — Patient Instructions (Signed)
We have put in your recall colonoscopy in for 12/2015 We will send you a reminder letter Follow up in 1 year after you have your colonoscopy

## 2015-02-28 DIAGNOSIS — M6281 Muscle weakness (generalized): Secondary | ICD-10-CM | POA: Diagnosis not present

## 2015-02-28 DIAGNOSIS — R262 Difficulty in walking, not elsewhere classified: Secondary | ICD-10-CM | POA: Diagnosis not present

## 2015-02-28 DIAGNOSIS — R2681 Unsteadiness on feet: Secondary | ICD-10-CM | POA: Diagnosis not present

## 2015-03-02 DIAGNOSIS — M6281 Muscle weakness (generalized): Secondary | ICD-10-CM | POA: Diagnosis not present

## 2015-03-02 DIAGNOSIS — R2681 Unsteadiness on feet: Secondary | ICD-10-CM | POA: Diagnosis not present

## 2015-03-02 DIAGNOSIS — R262 Difficulty in walking, not elsewhere classified: Secondary | ICD-10-CM | POA: Diagnosis not present

## 2015-03-05 DIAGNOSIS — R262 Difficulty in walking, not elsewhere classified: Secondary | ICD-10-CM | POA: Diagnosis not present

## 2015-03-05 DIAGNOSIS — M6281 Muscle weakness (generalized): Secondary | ICD-10-CM | POA: Diagnosis not present

## 2015-03-05 DIAGNOSIS — R2681 Unsteadiness on feet: Secondary | ICD-10-CM | POA: Diagnosis not present

## 2015-03-07 DIAGNOSIS — M6281 Muscle weakness (generalized): Secondary | ICD-10-CM | POA: Diagnosis not present

## 2015-03-07 DIAGNOSIS — R2681 Unsteadiness on feet: Secondary | ICD-10-CM | POA: Diagnosis not present

## 2015-03-07 DIAGNOSIS — R262 Difficulty in walking, not elsewhere classified: Secondary | ICD-10-CM | POA: Diagnosis not present

## 2015-03-09 DIAGNOSIS — R2681 Unsteadiness on feet: Secondary | ICD-10-CM | POA: Diagnosis not present

## 2015-03-09 DIAGNOSIS — M6281 Muscle weakness (generalized): Secondary | ICD-10-CM | POA: Diagnosis not present

## 2015-03-09 DIAGNOSIS — R262 Difficulty in walking, not elsewhere classified: Secondary | ICD-10-CM | POA: Diagnosis not present

## 2015-03-12 DIAGNOSIS — R2681 Unsteadiness on feet: Secondary | ICD-10-CM | POA: Diagnosis not present

## 2015-03-12 DIAGNOSIS — M6281 Muscle weakness (generalized): Secondary | ICD-10-CM | POA: Diagnosis not present

## 2015-03-12 DIAGNOSIS — R262 Difficulty in walking, not elsewhere classified: Secondary | ICD-10-CM | POA: Diagnosis not present

## 2015-03-14 DIAGNOSIS — M6281 Muscle weakness (generalized): Secondary | ICD-10-CM | POA: Diagnosis not present

## 2015-03-14 DIAGNOSIS — R2681 Unsteadiness on feet: Secondary | ICD-10-CM | POA: Diagnosis not present

## 2015-03-14 DIAGNOSIS — R262 Difficulty in walking, not elsewhere classified: Secondary | ICD-10-CM | POA: Diagnosis not present

## 2015-03-16 DIAGNOSIS — R2681 Unsteadiness on feet: Secondary | ICD-10-CM | POA: Diagnosis not present

## 2015-03-16 DIAGNOSIS — R262 Difficulty in walking, not elsewhere classified: Secondary | ICD-10-CM | POA: Diagnosis not present

## 2015-03-16 DIAGNOSIS — M6281 Muscle weakness (generalized): Secondary | ICD-10-CM | POA: Diagnosis not present

## 2015-03-19 DIAGNOSIS — R262 Difficulty in walking, not elsewhere classified: Secondary | ICD-10-CM | POA: Diagnosis not present

## 2015-03-19 DIAGNOSIS — R2681 Unsteadiness on feet: Secondary | ICD-10-CM | POA: Diagnosis not present

## 2015-03-19 DIAGNOSIS — M6281 Muscle weakness (generalized): Secondary | ICD-10-CM | POA: Diagnosis not present

## 2015-03-21 DIAGNOSIS — R2681 Unsteadiness on feet: Secondary | ICD-10-CM | POA: Diagnosis not present

## 2015-03-21 DIAGNOSIS — R262 Difficulty in walking, not elsewhere classified: Secondary | ICD-10-CM | POA: Diagnosis not present

## 2015-03-21 DIAGNOSIS — M6281 Muscle weakness (generalized): Secondary | ICD-10-CM | POA: Diagnosis not present

## 2015-03-23 DIAGNOSIS — R262 Difficulty in walking, not elsewhere classified: Secondary | ICD-10-CM | POA: Diagnosis not present

## 2015-03-23 DIAGNOSIS — R2681 Unsteadiness on feet: Secondary | ICD-10-CM | POA: Diagnosis not present

## 2015-03-23 DIAGNOSIS — M6281 Muscle weakness (generalized): Secondary | ICD-10-CM | POA: Diagnosis not present

## 2015-03-26 DIAGNOSIS — M6281 Muscle weakness (generalized): Secondary | ICD-10-CM | POA: Diagnosis not present

## 2015-03-26 DIAGNOSIS — R262 Difficulty in walking, not elsewhere classified: Secondary | ICD-10-CM | POA: Diagnosis not present

## 2015-03-26 DIAGNOSIS — R2681 Unsteadiness on feet: Secondary | ICD-10-CM | POA: Diagnosis not present

## 2015-03-29 DIAGNOSIS — M6281 Muscle weakness (generalized): Secondary | ICD-10-CM | POA: Diagnosis not present

## 2015-03-29 DIAGNOSIS — R2681 Unsteadiness on feet: Secondary | ICD-10-CM | POA: Diagnosis not present

## 2015-03-29 DIAGNOSIS — R262 Difficulty in walking, not elsewhere classified: Secondary | ICD-10-CM | POA: Diagnosis not present

## 2015-03-30 DIAGNOSIS — M6281 Muscle weakness (generalized): Secondary | ICD-10-CM | POA: Diagnosis not present

## 2015-03-30 DIAGNOSIS — R262 Difficulty in walking, not elsewhere classified: Secondary | ICD-10-CM | POA: Diagnosis not present

## 2015-03-30 DIAGNOSIS — R2681 Unsteadiness on feet: Secondary | ICD-10-CM | POA: Diagnosis not present

## 2015-04-02 DIAGNOSIS — R262 Difficulty in walking, not elsewhere classified: Secondary | ICD-10-CM | POA: Diagnosis not present

## 2015-04-02 DIAGNOSIS — R2681 Unsteadiness on feet: Secondary | ICD-10-CM | POA: Diagnosis not present

## 2015-04-02 DIAGNOSIS — M6281 Muscle weakness (generalized): Secondary | ICD-10-CM | POA: Diagnosis not present

## 2015-04-04 DIAGNOSIS — M6281 Muscle weakness (generalized): Secondary | ICD-10-CM | POA: Diagnosis not present

## 2015-04-04 DIAGNOSIS — R2681 Unsteadiness on feet: Secondary | ICD-10-CM | POA: Diagnosis not present

## 2015-04-04 DIAGNOSIS — R262 Difficulty in walking, not elsewhere classified: Secondary | ICD-10-CM | POA: Diagnosis not present

## 2015-04-05 DIAGNOSIS — D485 Neoplasm of uncertain behavior of skin: Secondary | ICD-10-CM | POA: Diagnosis not present

## 2015-04-05 DIAGNOSIS — L308 Other specified dermatitis: Secondary | ICD-10-CM | POA: Diagnosis not present

## 2015-04-05 DIAGNOSIS — B353 Tinea pedis: Secondary | ICD-10-CM | POA: Diagnosis not present

## 2015-04-05 DIAGNOSIS — L821 Other seborrheic keratosis: Secondary | ICD-10-CM | POA: Diagnosis not present

## 2015-04-05 DIAGNOSIS — Z85828 Personal history of other malignant neoplasm of skin: Secondary | ICD-10-CM | POA: Diagnosis not present

## 2015-04-05 DIAGNOSIS — D1801 Hemangioma of skin and subcutaneous tissue: Secondary | ICD-10-CM | POA: Diagnosis not present

## 2015-04-05 DIAGNOSIS — Z85820 Personal history of malignant melanoma of skin: Secondary | ICD-10-CM | POA: Diagnosis not present

## 2015-04-05 DIAGNOSIS — D2239 Melanocytic nevi of other parts of face: Secondary | ICD-10-CM | POA: Diagnosis not present

## 2015-04-06 DIAGNOSIS — R262 Difficulty in walking, not elsewhere classified: Secondary | ICD-10-CM | POA: Diagnosis not present

## 2015-04-06 DIAGNOSIS — M6281 Muscle weakness (generalized): Secondary | ICD-10-CM | POA: Diagnosis not present

## 2015-04-06 DIAGNOSIS — R2681 Unsteadiness on feet: Secondary | ICD-10-CM | POA: Diagnosis not present

## 2015-04-09 DIAGNOSIS — M6281 Muscle weakness (generalized): Secondary | ICD-10-CM | POA: Diagnosis not present

## 2015-04-09 DIAGNOSIS — R2681 Unsteadiness on feet: Secondary | ICD-10-CM | POA: Diagnosis not present

## 2015-04-09 DIAGNOSIS — R262 Difficulty in walking, not elsewhere classified: Secondary | ICD-10-CM | POA: Diagnosis not present

## 2015-04-11 DIAGNOSIS — R262 Difficulty in walking, not elsewhere classified: Secondary | ICD-10-CM | POA: Diagnosis not present

## 2015-04-11 DIAGNOSIS — M6281 Muscle weakness (generalized): Secondary | ICD-10-CM | POA: Diagnosis not present

## 2015-04-11 DIAGNOSIS — R2681 Unsteadiness on feet: Secondary | ICD-10-CM | POA: Diagnosis not present

## 2015-04-13 DIAGNOSIS — M6281 Muscle weakness (generalized): Secondary | ICD-10-CM | POA: Diagnosis not present

## 2015-04-13 DIAGNOSIS — R262 Difficulty in walking, not elsewhere classified: Secondary | ICD-10-CM | POA: Diagnosis not present

## 2015-04-13 DIAGNOSIS — R2681 Unsteadiness on feet: Secondary | ICD-10-CM | POA: Diagnosis not present

## 2015-04-16 DIAGNOSIS — R262 Difficulty in walking, not elsewhere classified: Secondary | ICD-10-CM | POA: Diagnosis not present

## 2015-04-16 DIAGNOSIS — R2681 Unsteadiness on feet: Secondary | ICD-10-CM | POA: Diagnosis not present

## 2015-04-16 DIAGNOSIS — M6281 Muscle weakness (generalized): Secondary | ICD-10-CM | POA: Diagnosis not present

## 2015-04-18 DIAGNOSIS — M6281 Muscle weakness (generalized): Secondary | ICD-10-CM | POA: Diagnosis not present

## 2015-04-18 DIAGNOSIS — R2681 Unsteadiness on feet: Secondary | ICD-10-CM | POA: Diagnosis not present

## 2015-04-18 DIAGNOSIS — R262 Difficulty in walking, not elsewhere classified: Secondary | ICD-10-CM | POA: Diagnosis not present

## 2015-04-20 DIAGNOSIS — M6281 Muscle weakness (generalized): Secondary | ICD-10-CM | POA: Diagnosis not present

## 2015-04-20 DIAGNOSIS — R262 Difficulty in walking, not elsewhere classified: Secondary | ICD-10-CM | POA: Diagnosis not present

## 2015-04-20 DIAGNOSIS — R2681 Unsteadiness on feet: Secondary | ICD-10-CM | POA: Diagnosis not present

## 2015-04-23 DIAGNOSIS — R2681 Unsteadiness on feet: Secondary | ICD-10-CM | POA: Diagnosis not present

## 2015-04-23 DIAGNOSIS — M6281 Muscle weakness (generalized): Secondary | ICD-10-CM | POA: Diagnosis not present

## 2015-04-23 DIAGNOSIS — R262 Difficulty in walking, not elsewhere classified: Secondary | ICD-10-CM | POA: Diagnosis not present

## 2015-04-25 DIAGNOSIS — M6281 Muscle weakness (generalized): Secondary | ICD-10-CM | POA: Diagnosis not present

## 2015-04-25 DIAGNOSIS — R2681 Unsteadiness on feet: Secondary | ICD-10-CM | POA: Diagnosis not present

## 2015-04-25 DIAGNOSIS — R262 Difficulty in walking, not elsewhere classified: Secondary | ICD-10-CM | POA: Diagnosis not present

## 2015-04-26 ENCOUNTER — Telehealth: Payer: Self-pay | Admitting: *Deleted

## 2015-04-26 DIAGNOSIS — E119 Type 2 diabetes mellitus without complications: Secondary | ICD-10-CM | POA: Diagnosis not present

## 2015-04-26 DIAGNOSIS — I10 Essential (primary) hypertension: Secondary | ICD-10-CM | POA: Diagnosis not present

## 2015-04-26 LAB — HEPATIC FUNCTION PANEL
ALT: 26 U/L (ref 7–35)
AST: 21 U/L (ref 13–35)
Alkaline Phosphatase: 105 U/L (ref 25–125)
BILIRUBIN, TOTAL: 0.4 mg/dL

## 2015-04-26 LAB — BASIC METABOLIC PANEL
BUN: 20 mg/dL (ref 4–21)
Creatinine: 0.8 mg/dL (ref 0.5–1.1)
GLUCOSE: 81 mg/dL
Potassium: 3.8 mmol/L (ref 3.4–5.3)
SODIUM: 141 mmol/L (ref 137–147)

## 2015-04-26 LAB — CBC AND DIFFERENTIAL
HEMATOCRIT: 36 % (ref 36–46)
Hemoglobin: 12 g/dL (ref 12.0–16.0)
PLATELETS: 174 10*3/uL (ref 150–399)
WBC: 7 10*3/mL

## 2015-04-26 LAB — POCT ERYTHROCYTE SEDIMENTATION RATE, NON-AUTOMATED: SED RATE: 61 mm

## 2015-04-26 LAB — HEMOGLOBIN A1C: HEMOGLOBIN A1C: 6.1

## 2015-04-26 MED ORDER — MESALAMINE 1.2 G PO TBEC: DELAYED_RELEASE_TABLET | ORAL | Status: DC

## 2015-04-26 NOTE — Telephone Encounter (Signed)
Med Lialda sent per fax request from pharmacy

## 2015-04-27 ENCOUNTER — Encounter: Payer: Self-pay | Admitting: *Deleted

## 2015-04-27 DIAGNOSIS — R262 Difficulty in walking, not elsewhere classified: Secondary | ICD-10-CM | POA: Diagnosis not present

## 2015-04-27 DIAGNOSIS — M6281 Muscle weakness (generalized): Secondary | ICD-10-CM | POA: Diagnosis not present

## 2015-04-27 DIAGNOSIS — R2681 Unsteadiness on feet: Secondary | ICD-10-CM | POA: Diagnosis not present

## 2015-04-30 DIAGNOSIS — R262 Difficulty in walking, not elsewhere classified: Secondary | ICD-10-CM | POA: Diagnosis not present

## 2015-04-30 DIAGNOSIS — R2681 Unsteadiness on feet: Secondary | ICD-10-CM | POA: Diagnosis not present

## 2015-04-30 DIAGNOSIS — M6281 Muscle weakness (generalized): Secondary | ICD-10-CM | POA: Diagnosis not present

## 2015-05-01 ENCOUNTER — Encounter: Payer: Self-pay | Admitting: Internal Medicine

## 2015-05-01 ENCOUNTER — Non-Acute Institutional Stay: Payer: Medicare Other | Admitting: Internal Medicine

## 2015-05-01 VITALS — BP 120/60 | HR 72 | Temp 97.6°F | Resp 20 | Ht 61.0 in | Wt 126.4 lb

## 2015-05-01 DIAGNOSIS — M353 Polymyalgia rheumatica: Secondary | ICD-10-CM | POA: Diagnosis not present

## 2015-05-01 DIAGNOSIS — E785 Hyperlipidemia, unspecified: Secondary | ICD-10-CM | POA: Diagnosis not present

## 2015-05-01 DIAGNOSIS — I10 Essential (primary) hypertension: Secondary | ICD-10-CM

## 2015-05-01 DIAGNOSIS — R7303 Prediabetes: Secondary | ICD-10-CM

## 2015-05-01 DIAGNOSIS — D509 Iron deficiency anemia, unspecified: Secondary | ICD-10-CM

## 2015-05-01 MED ORDER — BENAZEPRIL-HYDROCHLOROTHIAZIDE 20-12.5 MG PO TABS: ORAL_TABLET | ORAL | Status: DC

## 2015-05-01 MED ORDER — RANITIDINE HCL 150 MG PO TABS: ORAL_TABLET | ORAL | Status: DC

## 2015-05-01 MED ORDER — PREDNISONE 5 MG PO TABS: ORAL_TABLET | ORAL | Status: DC

## 2015-05-01 NOTE — Progress Notes (Signed)
Patient ID: Tammy Beard, female   DOB: 25-May-1940, 75 y.o.   MRN: [id number]    [place] [facility] Service: Clinic (12)     No Known Allergies  Chief Complaint  Patient presents with  . Medical Management of Chronic Issues    3 mo f/u for lab results anf medication refill.    HPI:  Polymyalgia rheumatica ([place]) - currently taking predniSONE (DELTASONE) 5 MG tablet. She was to be gradually descending dose, but sedimentation rate is now 61, which is a little higher than she was running throughout last year. She is asymptomatic. She denies myalgias, headache, palpitations, or sense of weakness.  Essential hypertension - controlled  Dyslipidemia - controlled  Pre-diabetes - normal fasting glucose, but slightly elevated A1c at 6.1.  Iron deficiency anemia - hemoglobin 10.4 one year ago but has been normal the last 2 checks at 12 g percent    Medications: Patient's Medications  New Prescriptions   No medications on file  Previous Medications   ACETAMINOPHEN (TYLENOL) 325 MG TABLET    Take 650 mg by mouth every 6 (six) hours as needed.   BENAZEPRIL-HYDROCHLORTHIAZIDE (LOTENSIN HCT) 20-12.5 MG PER TABLET    TAKE 1/2 TABLET EVERY MORNING.   CALCIUM CARBONATE-VITAMIN D (CALCIUM + D PO)    Take by mouth daily.   CETIRIZINE (ZYRTEC) 10 MG TABLET    Take 10 mg by mouth as needed. Allergies.   CICLOPIROX (LOPROX) 0.77 % CREAM    APPLY TO SKIN TWICE DAILY. APPLY TO FEET TWICE DAILY.   MESALAMINE (LIALDA) 1.2 G EC TABLET    take 2 tablets by mouth daily with BREAKFAST   MULTIPLE VITAMIN (MULTIVITAMIN) TABLET    Take 1 tablet by mouth daily.     PREDNISONE (DELTASONE) 5 MG TABLET    Take 1 1/2 tablet daily during 01/2015 then take 5 mg daily 12/16 through 04/2015   RANITIDINE (ZANTAC) 150 MG TABLET    Take 1 tablet (150 mg total) by mouth 2 (two) times daily.   TRIAMCINOLONE CREAM (KENALOG) 0.1 %    APPLY TO ACTIVE RASH ON CHEST AND BACK TWICE DAILY.  Modified  Medications   No medications on file  Discontinued Medications   No medications on file     Review of Systems  Constitutional: Negative for fever, chills, diaphoresis, activity change, appetite change, fatigue and unexpected weight change.  HENT: Negative for congestion, ear discharge, ear pain, hearing loss, postnasal drip, rhinorrhea, sore throat, tinnitus, trouble swallowing and voice change.   Eyes: Positive for visual disturbance (corrective lenses). Negative for pain, redness and itching.  Respiratory: Negative for cough, choking, shortness of breath and wheezing.   Cardiovascular: Negative for chest pain, palpitations and leg swelling.  Gastrointestinal: Negative for nausea, abdominal pain, diarrhea, constipation and abdominal distention.  Endocrine: Negative for cold intolerance, heat intolerance, polydipsia, polyphagia and polyuria.       History of elevated glucose.  Genitourinary: Negative for dysuria, urgency, frequency, hematuria, flank pain, vaginal discharge, difficulty urinating and pelvic pain.  Musculoskeletal: Positive for back pain. Negative for myalgias, arthralgias, gait problem, neck pain and neck stiffness.       History of osteopenia and kyphosis  Skin: Negative for color change, pallor and rash.  Allergic/Immunologic: Negative.   Neurological: Negative for dizziness, tremors, seizures, syncope, weakness, numbness and headaches.       History of poliomyelitis at age 79  Hematological: Negative for adenopathy. Does not bruise/bleed easily.  Psychiatric/Behavioral: Negative for suicidal ideas, hallucinations, behavioral problems, confusion, sleep disturbance, dysphoric mood and agitation. The patient is not nervous/anxious and is not hyperactive.     Filed Vitals:   05/01/15 1108  BP: 120/60  Pulse: 72  Temp: 97.6 F (36.4 C)  TempSrc: Oral  Resp: 20  Height: _0  (1.549 m)  Weight: 126 lb 6.4 oz (57.335 kg)  SpO2: 94%   Wt Readings from Last 3  Encounters:  05/01/15 126 lb 6.4 oz (57.335 kg)  02/23/15 122 lb 2 oz (55.396 kg)  01/23/15 123 lb (55.792 kg)    Body mass index is 23.9 kg/(m^2).  Physical Exam  Constitutional: She is oriented to person, place, and time. She appears well-developed and well-nourished. No distress.  HENT:  Right Ear: External ear normal.  Left Ear: External ear normal.  Nose: Nose normal.  Mouth/Throat: Oropharynx is clear and moist. No oropharyngeal exudate.  Eyes: Conjunctivae and EOM are normal. Pupils are equal, round, and reactive to light. No scleral icterus.  Prescription lenses  Neck: No JVD present. No tracheal deviation present. No thyromegaly present.  Cardiovascular: Normal rate, regular rhythm, normal heart sounds and intact distal pulses.  Exam reveals no gallop and no friction rub.   No murmur heard. Pulmonary/Chest: Effort normal. No respiratory distress. She has no wheezes. She has no rales. She exhibits no tenderness.  Abdominal: She exhibits no distension and no mass. There is no tenderness.  Large diastasis recti  Musculoskeletal: Normal range of motion. She exhibits no edema or tenderness.  Kyphosis  Lymphadenopathy:    She has no cervical adenopathy.  Neurological: She is alert and oriented to person, place, and time. No cranial nerve deficit. Coordination normal.  Mild tremor of extended arms  Skin: No rash noted. She is not diaphoretic. No erythema. No pallor.  Psychiatric: She has a normal mood and affect. Her behavior is normal. Judgment and thought content normal.     Labs reviewed: Lab Summary Latest Ref Rng 04/26/2015 05/17/2014 05/11/2014  Hemoglobin 12.0 - 16.0 g/dL 12.0 12.0 (None)  Hematocrit 36 - 46 % 36 37.3 (None)  White count - 7.0 7.6 (None)  Platelet count 150 - 399 K/L 174 188.0 (None)  Sodium 137 - 147 mmol/L 141 (None) 135  Potassium 3.4 - 5.3 mmol/L 3.8 (None) 4.8  Calcium 8.4 - 10.5 mg/dL (None) (None) 9.3  Phosphorus - (None) (None) (None)    Creatinine 0.5 - 1.1 mg/dL 0.8 (None) 0.94  AST 13 - 35 U/L 21 (None) (None)  Alk Phos 25 - 125 U/L 105 (None) (None)  Bilirubin - (None) (None) (None)  Glucose - 81 (None) 300(H)  Cholesterol - (None) (None) (None)  HDL cholesterol - (None) (None) (None)  Triglycerides - (None) (None) (None)  LDL Direct - (None) (None) (None)  LDL Calc - (None) (None) (None)  Total protein - (None) (None) (None)  Albumin - (None) (None) (None)   Lab Results  Component Value Date   TSH 0.68 08/25/2013   Lab Results  Component Value Date   BUN 20 04/26/2015   BUN 24* 05/11/2014   BUN 17 08/25/2013   Lab Results  Component Value Date   CREATININE 0.8 04/26/2015   CREATININE 0.94 05/11/2014   CREATININE 0.7 08/25/2013    Lab Results  Component Value Date   HGBA1C 6.1 04/26/2015   HGBA1C 5.9 12/07/2014   HGBA1C 5.9 09/07/2014       Assessment/Plan  1. Polymyalgia rheumatica (HCC) Due to the slight  elevation in her sedimentation rate at 61, recommended that she simply stay with the 5 mg tablet that she is currently taking. - predniSONE (DELTASONE) 5 MG tablet; Take 1 tablet daily   2. Essential hypertension Controlled  3. Dyslipidemia Controlled  4. Pre-diabetes Unchanged  5. Iron deficiency anemia Follow-up CBC prior to next visit

## 2015-05-02 DIAGNOSIS — R262 Difficulty in walking, not elsewhere classified: Secondary | ICD-10-CM | POA: Diagnosis not present

## 2015-05-02 DIAGNOSIS — M6281 Muscle weakness (generalized): Secondary | ICD-10-CM | POA: Diagnosis not present

## 2015-05-02 DIAGNOSIS — R2681 Unsteadiness on feet: Secondary | ICD-10-CM | POA: Diagnosis not present

## 2015-05-04 DIAGNOSIS — R262 Difficulty in walking, not elsewhere classified: Secondary | ICD-10-CM | POA: Diagnosis not present

## 2015-05-04 DIAGNOSIS — M6281 Muscle weakness (generalized): Secondary | ICD-10-CM | POA: Diagnosis not present

## 2015-05-04 DIAGNOSIS — R2681 Unsteadiness on feet: Secondary | ICD-10-CM | POA: Diagnosis not present

## 2015-05-07 DIAGNOSIS — R262 Difficulty in walking, not elsewhere classified: Secondary | ICD-10-CM | POA: Diagnosis not present

## 2015-05-07 DIAGNOSIS — M6281 Muscle weakness (generalized): Secondary | ICD-10-CM | POA: Diagnosis not present

## 2015-05-07 DIAGNOSIS — R2681 Unsteadiness on feet: Secondary | ICD-10-CM | POA: Diagnosis not present

## 2015-06-12 ENCOUNTER — Other Ambulatory Visit: Payer: Self-pay | Admitting: *Deleted

## 2015-06-12 MED ORDER — LOSARTAN POTASSIUM-HCTZ 50-12.5 MG PO TABS: ORAL_TABLET | ORAL | Status: DC

## 2015-08-23 DIAGNOSIS — E785 Hyperlipidemia, unspecified: Secondary | ICD-10-CM | POA: Diagnosis not present

## 2015-08-23 DIAGNOSIS — I10 Essential (primary) hypertension: Secondary | ICD-10-CM | POA: Diagnosis not present

## 2015-08-23 DIAGNOSIS — E118 Type 2 diabetes mellitus with unspecified complications: Secondary | ICD-10-CM | POA: Diagnosis not present

## 2015-08-23 DIAGNOSIS — M353 Polymyalgia rheumatica: Secondary | ICD-10-CM | POA: Diagnosis not present

## 2015-08-23 LAB — LIPID PANEL
CHOLESTEROL: 180 mg/dL (ref 0–200)
HDL: 73 mg/dL — AB (ref 35–70)
LDL Cholesterol: 63 mg/dL
TRIGLYCERIDES: 222 mg/dL — AB (ref 40–160)

## 2015-08-23 LAB — POCT ERYTHROCYTE SEDIMENTATION RATE, NON-AUTOMATED: SED RATE: 74 mm

## 2015-08-23 LAB — CBC AND DIFFERENTIAL
HEMATOCRIT: 38 % (ref 36–46)
HEMOGLOBIN: 12.3 g/dL (ref 12.0–16.0)
Platelets: 170 10*3/uL (ref 150–399)
WBC: 9.2 10^3/mL

## 2015-08-23 LAB — BASIC METABOLIC PANEL
BUN: 29 mg/dL — AB (ref 4–21)
CREATININE: 1.3 mg/dL — AB (ref 0.5–1.1)
Glucose: 98 mg/dL
POTASSIUM: 4.7 mmol/L (ref 3.4–5.3)
Sodium: 139 mmol/L (ref 137–147)

## 2015-08-23 LAB — HEMOGLOBIN A1C: Hemoglobin A1C: 6

## 2015-08-28 ENCOUNTER — Encounter: Payer: Self-pay | Admitting: Internal Medicine

## 2015-08-28 ENCOUNTER — Non-Acute Institutional Stay: Payer: Medicare Other | Admitting: Internal Medicine

## 2015-08-28 VITALS — BP 136/74 | HR 74 | Temp 97.3°F | Ht 61.0 in | Wt 122.0 lb

## 2015-08-28 DIAGNOSIS — K5190 Ulcerative colitis, unspecified, without complications: Secondary | ICD-10-CM

## 2015-08-28 DIAGNOSIS — D509 Iron deficiency anemia, unspecified: Secondary | ICD-10-CM

## 2015-08-28 DIAGNOSIS — I10 Essential (primary) hypertension: Secondary | ICD-10-CM

## 2015-08-28 DIAGNOSIS — E785 Hyperlipidemia, unspecified: Secondary | ICD-10-CM

## 2015-08-28 DIAGNOSIS — M353 Polymyalgia rheumatica: Secondary | ICD-10-CM

## 2015-08-28 DIAGNOSIS — R7303 Prediabetes: Secondary | ICD-10-CM | POA: Diagnosis not present

## 2015-08-28 MED ORDER — PREDNISONE 5 MG PO TABS: ORAL_TABLET | ORAL | Status: DC

## 2015-08-28 NOTE — Progress Notes (Signed)
Patient ID: Tammy Beard, female   DOB: [birth year], 75 y.o.   MRN: [id number]    [place] [facility] Service: Clinic (12)     No Known Allergies  Chief Complaint  Patient presents with  . Medical Management of Chronic Issues    4 month medication management blood pressure, anemia, blood sugar, polymyalgia rheumatica  . Back Pain    mid-back, comes and goes.    HPI:  Back pain comes and goes. Using Tylenol. Using every 6 hours. She thinks it may be related to her polymyalgia.  Essential hypertension - controlled  Iron deficiency anemia - resolved  Polymyalgia rheumatica (HCC) - ESR was 74  Dyslipidemia - controlled  Pre-diabetes - controlled  Ulcerative colitis without complications, unspecified location (HCC) - in remission    Medications: Patient's Medications  New Prescriptions   No medications on file  Previous Medications   ACETAMINOPHEN (TYLENOL) 325 MG TABLET    Take 650 mg by mouth every 6 (six) hours as needed.   CALCIUM CARBONATE-VITAMIN D (CALCIUM + D PO)    Take by mouth daily.   CETIRIZINE (ZYRTEC) 10 MG TABLET    Take 10 mg by mouth as needed. Allergies.   CICLOPIROX (LOPROX) 0.77 % CREAM    APPLY TO SKIN TWICE DAILY. APPLY TO FEET TWICE DAILY.   LOSARTAN-HYDROCHLOROTHIAZIDE (HYZAAR) 50-12.5 MG TABLET    Take 1 tablet daily to control BP.   MESALAMINE (LIALDA) 1.2 G EC TABLET    take 2 tablets by mouth daily with BREAKFAST   MULTIPLE VITAMIN (MULTIVITAMIN) TABLET    Take 1 tablet by mouth daily.     PREDNISONE (DELTASONE) 5 MG TABLET    ONE TABLET DAILY TO TREAT POLYMYALGIA RHEUMATICA   RANITIDINE (ZANTAC) 150 MG TABLET    One twice daily to reduce stomach acid   TRIAMCINOLONE CREAM (KENALOG) 0.1 %    APPLY TO ACTIVE RASH ON CHEST AND BACK TWICE DAILY.  Modified Medications   No medications on file  Discontinued Medications   No medications on file     Review of Systems  Constitutional: Negative for fever, chills,  diaphoresis, activity change, appetite change, fatigue and unexpected weight change.  HENT: Negative for congestion, ear discharge, ear pain, hearing loss, postnasal drip, rhinorrhea, sore throat, tinnitus, trouble swallowing and voice change.   Eyes: Positive for visual disturbance (corrective lenses). Negative for pain, redness and itching.  Respiratory: Negative for cough, choking, shortness of breath and wheezing.   Cardiovascular: Negative for chest pain, palpitations and leg swelling.  Gastrointestinal: Negative for nausea, abdominal pain, diarrhea, constipation and abdominal distention.       Hx ulcerative colitis  Endocrine: Negative for cold intolerance, heat intolerance, polydipsia, polyphagia and polyuria.       History of elevated glucose.  Genitourinary: Negative for dysuria, urgency, frequency, hematuria, flank pain, vaginal discharge, difficulty urinating and pelvic pain.  Musculoskeletal: Positive for back pain. Negative for myalgias, arthralgias, gait problem, neck pain and neck stiffness.       Hx PMR and chronic use pf prednisone. History of osteopenia and kyphosis  Skin: Negative for color change, pallor and rash.  Allergic/Immunologic: Negative.   Neurological: Negative for dizziness, tremors, seizures, syncope, weakness, numbness and headaches.       History of poliomyelitis at age 28  Hematological: Negative for adenopathy. Does not bruise/bleed easily.  Psychiatric/Behavioral: Negative for suicidal ideas, hallucinations, behavioral problems, confusion, sleep disturbance, dysphoric mood and agitation. The patient  is not nervous/anxious and is not hyperactive.     Filed Vitals:   08/28/15 1003  BP: 136/74  Pulse: 74  Temp: 97.3 F (36.3 C)  TempSrc: Oral  Height: 5' 1"  (1.549 m)  Weight: 122 lb (55.339 kg)  SpO2: 96%   Wt Readings from Last 3 Encounters:  08/28/15 122 lb (55.339 kg)  05/01/15 126 lb 6.4 oz (57.335 kg)  02/23/15 122 lb 2 oz (55.396 kg)     Body mass index is 23.06 kg/(m^2).  Physical Exam  Constitutional: She is oriented to person, place, and time. She appears well-developed and well-nourished. No distress.  HENT:  Right Ear: External ear normal.  Left Ear: External ear normal.  Nose: Nose normal.  Mouth/Throat: Oropharynx is clear and moist. No oropharyngeal exudate.  Eyes: Conjunctivae and EOM are normal. Pupils are equal, round, and reactive to light. No scleral icterus.  Prescription lenses  Neck: No JVD present. No tracheal deviation present. No thyromegaly present.  Cardiovascular: Normal rate, regular rhythm, normal heart sounds and intact distal pulses.  Exam reveals no gallop and no friction rub.   No murmur heard. Pulmonary/Chest: Effort normal. No respiratory distress. She has no wheezes. She has no rales. She exhibits no tenderness.  Abdominal: She exhibits no distension and no mass. There is no tenderness.  Large diastasis recti  Musculoskeletal: Normal range of motion. She exhibits no edema or tenderness.  Kyphosis  Lymphadenopathy:    She has no cervical adenopathy.  Neurological: She is alert and oriented to person, place, and time. No cranial nerve deficit. Coordination normal.  Mild tremor of extended arms  Skin: No rash noted. She is not diaphoretic. No erythema. No pallor.  Psychiatric: She has a normal mood and affect. Her behavior is normal. Judgment and thought content normal.     Labs reviewed: Lab Summary Latest Ref Rng 08/23/2015 04/26/2015 05/17/2014  Hemoglobin 12.0 - 16.0 g/dL 12.3 12.0 12.0  Hematocrit 36 - 46 % 38 36 37.3  White count - 9.2 7.0 7.6  Platelet count 150 - 399 K/L 170 174 188.0  Sodium 137 - 147 mmol/L 139 141 (None)  Potassium 3.4 - 5.3 mmol/L 4.7 3.8 (None)  Calcium - (None) (None) (None)  Phosphorus - (None) (None) (None)  Creatinine 0.5 - 1.1 mg/dL 1.3(A) 0.8 (None)  AST 13 - 35 U/L (None) 21 (None)  Alk Phos 25 - 125 U/L (None) 105 (None)  Bilirubin - (None)  (None) (None)  Glucose - 98 81 (None)  Cholesterol 0 - 200 mg/dL 180 (None) (None)  HDL cholesterol 35 - 70 mg/dL 73(A) (None) (None)  Triglycerides 40 - 160 mg/dL 222(A) (None) (None)  LDL Direct - (None) (None) (None)  LDL Calc - 63 (None) (None)  Total protein - (None) (None) (None)  Albumin - (None) (None) (None)   Lab Results  Component Value Date   TSH 0.68 08/25/2013   Lab Results  Component Value Date   BUN 29* 08/23/2015   BUN 20 04/26/2015   BUN 24* 05/11/2014   Lab Results  Component Value Date   CREATININE 1.3* 08/23/2015   CREATININE 0.8 04/26/2015   CREATININE 0.94 05/11/2014   Lab Results  Component Value Date   HGBA1C 6.0 08/23/2015   HGBA1C 6.1 04/26/2015   HGBA1C 5.9 12/07/2014   Assessment/Plan  1. Essential hypertension controlled  2. Iron deficiency anemia resolved  3. Polymyalgia rheumatica (HCC) - Increase predniSONE (DELTASONE) 5 MG tablet to  TWO TABLETS DAILY TO TREAT POLYMYALGIA  RHEUMATICA  Dispense: 180 tablet; Refill: 2 -ESR, future  4. Dyslipidemia controlled  5. Pre-diabetes controlled  6. Ulcerative colitis without complications, unspecified location ([place]) In remission

## 2015-09-10 DIAGNOSIS — M353 Polymyalgia rheumatica: Secondary | ICD-10-CM | POA: Diagnosis not present

## 2015-09-10 LAB — POCT ERYTHROCYTE SEDIMENTATION RATE, NON-AUTOMATED: SED RATE: 45 mm

## 2015-09-11 ENCOUNTER — Encounter: Payer: Self-pay | Admitting: *Deleted

## 2015-09-18 ENCOUNTER — Non-Acute Institutional Stay: Payer: Medicare Other | Admitting: Internal Medicine

## 2015-09-18 ENCOUNTER — Encounter: Payer: Self-pay | Admitting: Internal Medicine

## 2015-09-18 VITALS — BP 164/84 | HR 53 | Temp 98.2°F

## 2015-09-18 DIAGNOSIS — M353 Polymyalgia rheumatica: Secondary | ICD-10-CM | POA: Diagnosis not present

## 2015-09-18 DIAGNOSIS — I10 Essential (primary) hypertension: Secondary | ICD-10-CM | POA: Diagnosis not present

## 2015-09-18 DIAGNOSIS — E785 Hyperlipidemia, unspecified: Secondary | ICD-10-CM | POA: Diagnosis not present

## 2015-09-18 DIAGNOSIS — R7303 Prediabetes: Secondary | ICD-10-CM

## 2015-09-18 DIAGNOSIS — K5190 Ulcerative colitis, unspecified, without complications: Secondary | ICD-10-CM | POA: Diagnosis not present

## 2015-09-18 DIAGNOSIS — R42 Dizziness and giddiness: Secondary | ICD-10-CM

## 2015-09-18 MED ORDER — DIMENHYDRINATE 50 MG PO TABS: ORAL_TABLET | ORAL | Status: DC

## 2015-09-18 NOTE — Progress Notes (Signed)
Patient ID: Tammy Beard, female   DOB: May 06, 1940, 75 y.o.   MRN: [id number]    [facility] (12)     No Known Allergies  Chief Complaint  Patient presents with  . Medical Management of Chronic Issues    3 week follow-up for polymyalgia rheumatica  . Dizziness    for couple of days, vomited 4 times last night. In wheelchair.     HPI:  Although this was a scheduled follow-up of her medical issues, she is also acutely ill. Patient has been sick for at least 2 days. She feels quite dizzy . There have been some chills, but she has not run any fever. He began having a headache yesterday area and she is using Tylenol for that. Other symptoms include the nausea and vomiting of a golden slimy material, watery eyes, rhinorrhea, and an occasional loose stool. Vision does not seem as good to her. There is no associated abdominal pain.  Polymyalgia is stable. She denies significant increase or change and myalgias. Most recent sedimentation rate was 45, which is improved from other sedimentation rates of 74.  Ulcerative colitis is controlled. We have been some loose stools, but she associates this with the nausea and vomiting. She is taking Lialda and sees the gastroenterologist periodically, Dr. Silverio Decamp.  Medications: Patient's Medications  New Prescriptions   No medications on file  Previous Medications   ACETAMINOPHEN (TYLENOL) 325 MG TABLET    Take 650 mg by mouth every 6 (six) hours as needed.   CALCIUM CARBONATE-VITAMIN D (CALCIUM + D PO)    Take by mouth daily.   LOSARTAN-HYDROCHLOROTHIAZIDE (HYZAAR) 50-12.5 MG TABLET    Take 1 tablet by mouth. Take 1/2 tablet daily   MESALAMINE (LIALDA) 1.2 G EC TABLET    take 2 tablets by mouth daily with BREAKFAST   MULTIPLE VITAMIN (MULTIVITAMIN) TABLET    Take 1 tablet by mouth daily.     PREDNISONE (DELTASONE) 5 MG TABLET    TWO TABLETs DAILY TO TREAT POLYMYALGIA RHEUMATICA   RANITIDINE (ZANTAC) 150 MG TABLET    One  twice daily to reduce stomach acid  Modified Medications   No medications on file  Discontinued Medications   CETIRIZINE (ZYRTEC) 10 MG TABLET    Take 10 mg by mouth as needed. Allergies.   CICLOPIROX (LOPROX) 0.77 % CREAM    APPLY TO SKIN TWICE DAILY. APPLY TO FEET TWICE DAILY.   LOSARTAN-HYDROCHLOROTHIAZIDE (HYZAAR) 50-12.5 MG TABLET    Take 1 tablet daily to control BP.   TRIAMCINOLONE CREAM (KENALOG) 0.1 %    APPLY TO ACTIVE RASH ON CHEST AND BACK TWICE DAILY.     Review of Systems  Constitutional: Negative for fever, chills, diaphoresis, activity change, appetite change, fatigue and unexpected weight change.  HENT: Negative for congestion, ear discharge, ear pain, hearing loss, postnasal drip, rhinorrhea, sore throat, tinnitus, trouble swallowing and voice change.   Eyes: Positive for visual disturbance (corrective lenses). Negative for pain, redness and itching.  Respiratory: Negative for cough, choking, shortness of breath and wheezing.   Cardiovascular: Negative for chest pain, palpitations and leg swelling.  Gastrointestinal: Positive for nausea, vomiting and diarrhea. Negative for abdominal pain, constipation and abdominal distention.       Hx ulcerative colitis; on Lialda.  Endocrine: Negative for cold intolerance, heat intolerance, polydipsia, polyphagia and polyuria.       History of elevated glucose.  Genitourinary: Negative for dysuria, urgency, frequency, hematuria, flank  pain, vaginal discharge, difficulty urinating and pelvic pain.  Musculoskeletal: Positive for back pain. Negative for myalgias, arthralgias, gait problem, neck pain and neck stiffness.       Hx PMR and chronic use pf prednisone. History of osteopenia and kyphosis  Skin: Negative for color change, pallor and rash.  Allergic/Immunologic: Negative.   Neurological: Positive for dizziness and light-headedness. Negative for tremors, seizures, syncope, weakness, numbness and headaches.       History of  poliomyelitis at age 64  Hematological: Negative for adenopathy. Does not bruise/bleed easily.  Psychiatric/Behavioral: Negative for suicidal ideas, hallucinations, behavioral problems, confusion, sleep disturbance, dysphoric mood and agitation. The patient is not nervous/anxious and is not hyperactive.     Filed Vitals:   09/18/15 1049  BP: 164/84  Pulse: 53  Temp: 98.2 F (36.8 C)  SpO2: 94%   Wt Readings from Last 3 Encounters:  08/28/15 122 lb (55.339 kg)  05/01/15 126 lb 6.4 oz (57.335 kg)  02/23/15 122 lb 2 oz (55.396 kg)    There is no weight on file to calculate BMI.  Physical Exam  Constitutional: She is oriented to person, place, and time. She appears well-developed and well-nourished. No distress.  HENT:  Right Ear: External ear normal.  Left Ear: External ear normal.  Nose: Nose normal.  Mouth/Throat: Oropharynx is clear and moist. No oropharyngeal exudate.  Wearing hearing aids bilaterally. EAC and TM normal bilaterally.  Eyes: Conjunctivae and EOM are normal. Pupils are equal, round, and reactive to light. No scleral icterus.  Prescription lenses  Neck: No JVD present. No tracheal deviation present. No thyromegaly present.  Cardiovascular: Normal rate, regular rhythm, normal heart sounds and intact distal pulses.  Exam reveals no gallop and no friction rub.   No murmur heard. Pulmonary/Chest: Effort normal. No respiratory distress. She has no wheezes. She has no rales. She exhibits no tenderness.  Abdominal: Soft. Bowel sounds are normal. She exhibits no distension and no mass. There is no tenderness.  Large diastasis recti  Musculoskeletal: Normal range of motion. She exhibits no edema or tenderness.  Kyphosis  Lymphadenopathy:    She has no cervical adenopathy.  Neurological: She is alert and oriented to person, place, and time. No cranial nerve deficit. Coordination normal.  Mild tremor of extended arms  Skin: No rash noted. She is not diaphoretic. No  erythema. No pallor.  Psychiatric: She has a normal mood and affect. Her behavior is normal. Judgment and thought content normal.     Labs reviewed: Lab Summary Latest Ref Rng 08/23/2015 04/26/2015 05/17/2014  Hemoglobin 12.0 - 16.0 g/dL 12.3 12.0 12.0  Hematocrit 36 - 46 % 38 36 37.3  White count - 9.2 7.0 7.6  Platelet count 150 - 399 K/L 170 174 188.0  Sodium 137 - 147 mmol/L 139 141 (None)  Potassium 3.4 - 5.3 mmol/L 4.7 3.8 (None)  Calcium - (None) (None) (None)  Phosphorus - (None) (None) (None)  Creatinine 0.5 - 1.1 mg/dL 1.3(A) 0.8 (None)  AST 13 - 35 U/L (None) 21 (None)  Alk Phos 25 - 125 U/L (None) 105 (None)  Bilirubin - (None) (None) (None)  Glucose - 98 81 (None)  Cholesterol 0 - 200 mg/dL 180 (None) (None)  HDL cholesterol 35 - 70 mg/dL 73(A) (None) (None)  Triglycerides 40 - 160 mg/dL 222(A) (None) (None)  LDL Direct - (None) (None) (None)  LDL Calc - 63 (None) (None)  Total protein - (None) (None) (None)  Albumin - (None) (None) (None)  Lab Results  Component Value Date   TSH 0.68 08/25/2013   Lab Results  Component Value Date   BUN 29* 08/23/2015   BUN 20 04/26/2015   BUN 24* 05/11/2014   Lab Results  Component Value Date   CREATININE 1.3* 08/23/2015   CREATININE 0.8 04/26/2015   CREATININE 0.94 05/11/2014   Lab Results  Component Value Date   HGBA1C 6.0 08/23/2015   HGBA1C 6.1 04/26/2015   HGBA1C 5.9 12/07/2014   09/10/2015 ESR 45    Assessment/Plan  1. Polymyalgia rheumatica (HCC) Continue 5 mg prednisone daily -ESR future -CBC, future  2. Essential hypertension Controlled -CMP, future  3. Dizzy -Dramamine 50 mg every 6 hours when necessary  4. Ulcerative colitis without complications, unspecified location ([place]) -Continue Lialda  5. Pre-diabetes -CMP, future  6. Hyperlipidemia lipid panel, future -A1c, CMP, microalbumin prior to next visit

## 2015-09-21 ENCOUNTER — Inpatient Hospital Stay (HOSPITAL_COMMUNITY)
Admission: EM | Admit: 2015-09-21 | Discharge: 2015-09-26 | DRG: 065 | Disposition: A | Discharge status: Skilled Nursing Facility | Payer: Medicare Other | Attending: Internal Medicine | Admitting: Internal Medicine

## 2015-09-21 ENCOUNTER — Encounter (HOSPITAL_COMMUNITY): Payer: Self-pay | Admitting: Emergency Medicine

## 2015-09-21 ENCOUNTER — Telehealth: Payer: Self-pay | Admitting: *Deleted

## 2015-09-21 ENCOUNTER — Emergency Department (HOSPITAL_COMMUNITY): Payer: Medicare Other

## 2015-09-21 DIAGNOSIS — E785 Hyperlipidemia, unspecified: Secondary | ICD-10-CM | POA: Diagnosis present

## 2015-09-21 DIAGNOSIS — R011 Cardiac murmur, unspecified: Secondary | ICD-10-CM | POA: Diagnosis not present

## 2015-09-21 DIAGNOSIS — R402252 Coma scale, best verbal response, oriented, at arrival to emergency department: Secondary | ICD-10-CM | POA: Diagnosis present

## 2015-09-21 DIAGNOSIS — E860 Dehydration: Secondary | ICD-10-CM | POA: Diagnosis present

## 2015-09-21 DIAGNOSIS — E871 Hypo-osmolality and hyponatremia: Secondary | ICD-10-CM | POA: Diagnosis present

## 2015-09-21 DIAGNOSIS — H9190 Unspecified hearing loss, unspecified ear: Secondary | ICD-10-CM | POA: Diagnosis present

## 2015-09-21 DIAGNOSIS — I471 Supraventricular tachycardia: Secondary | ICD-10-CM

## 2015-09-21 DIAGNOSIS — Z85820 Personal history of malignant melanoma of skin: Secondary | ICD-10-CM

## 2015-09-21 DIAGNOSIS — R402362 Coma scale, best motor response, obeys commands, at arrival to emergency department: Secondary | ICD-10-CM | POA: Diagnosis present

## 2015-09-21 DIAGNOSIS — S0990XA Unspecified injury of head, initial encounter: Secondary | ICD-10-CM | POA: Diagnosis present

## 2015-09-21 DIAGNOSIS — R001 Bradycardia, unspecified: Secondary | ICD-10-CM | POA: Diagnosis present

## 2015-09-21 DIAGNOSIS — M069 Rheumatoid arthritis, unspecified: Secondary | ICD-10-CM | POA: Diagnosis present

## 2015-09-21 DIAGNOSIS — Z8612 Personal history of poliomyelitis: Secondary | ICD-10-CM

## 2015-09-21 DIAGNOSIS — R29700 NIHSS score 0: Secondary | ICD-10-CM | POA: Diagnosis present

## 2015-09-21 DIAGNOSIS — M810 Age-related osteoporosis without current pathological fracture: Secondary | ICD-10-CM | POA: Diagnosis present

## 2015-09-21 DIAGNOSIS — R402142 Coma scale, eyes open, spontaneous, at arrival to emergency department: Secondary | ICD-10-CM | POA: Diagnosis present

## 2015-09-21 DIAGNOSIS — Z7952 Long term (current) use of systemic steroids: Secondary | ICD-10-CM

## 2015-09-21 DIAGNOSIS — I10 Essential (primary) hypertension: Secondary | ICD-10-CM | POA: Diagnosis present

## 2015-09-21 DIAGNOSIS — R42 Dizziness and giddiness: Secondary | ICD-10-CM | POA: Insufficient documentation

## 2015-09-21 DIAGNOSIS — R079 Chest pain, unspecified: Secondary | ICD-10-CM | POA: Diagnosis not present

## 2015-09-21 DIAGNOSIS — K219 Gastro-esophageal reflux disease without esophagitis: Secondary | ICD-10-CM | POA: Diagnosis present

## 2015-09-21 DIAGNOSIS — Z66 Do not resuscitate: Secondary | ICD-10-CM | POA: Diagnosis present

## 2015-09-21 DIAGNOSIS — I639 Cerebral infarction, unspecified: Principal | ICD-10-CM | POA: Diagnosis present

## 2015-09-21 DIAGNOSIS — I672 Cerebral atherosclerosis: Secondary | ICD-10-CM | POA: Diagnosis present

## 2015-09-21 DIAGNOSIS — E878 Other disorders of electrolyte and fluid balance, not elsewhere classified: Secondary | ICD-10-CM | POA: Diagnosis not present

## 2015-09-21 DIAGNOSIS — R262 Difficulty in walking, not elsewhere classified: Secondary | ICD-10-CM | POA: Diagnosis present

## 2015-09-21 DIAGNOSIS — R110 Nausea: Secondary | ICD-10-CM | POA: Diagnosis not present

## 2015-09-21 DIAGNOSIS — I771 Stricture of artery: Secondary | ICD-10-CM | POA: Diagnosis present

## 2015-09-21 DIAGNOSIS — K5190 Ulcerative colitis, unspecified, without complications: Secondary | ICD-10-CM | POA: Diagnosis present

## 2015-09-21 DIAGNOSIS — G479 Sleep disorder, unspecified: Secondary | ICD-10-CM | POA: Diagnosis present

## 2015-09-21 DIAGNOSIS — R404 Transient alteration of awareness: Secondary | ICD-10-CM | POA: Diagnosis not present

## 2015-09-21 DIAGNOSIS — W19XXXA Unspecified fall, initial encounter: Secondary | ICD-10-CM | POA: Diagnosis present

## 2015-09-21 DIAGNOSIS — N179 Acute kidney failure, unspecified: Secondary | ICD-10-CM | POA: Diagnosis present

## 2015-09-21 DIAGNOSIS — T502X5A Adverse effect of carbonic-anhydrase inhibitors, benzothiadiazides and other diuretics, initial encounter: Secondary | ICD-10-CM | POA: Diagnosis present

## 2015-09-21 DIAGNOSIS — M353 Polymyalgia rheumatica: Secondary | ICD-10-CM | POA: Diagnosis present

## 2015-09-21 DIAGNOSIS — Y929 Unspecified place or not applicable: Secondary | ICD-10-CM

## 2015-09-21 DIAGNOSIS — Z8673 Personal history of transient ischemic attack (TIA), and cerebral infarction without residual deficits: Secondary | ICD-10-CM | POA: Insufficient documentation

## 2015-09-21 DIAGNOSIS — A419 Sepsis, unspecified organism: Secondary | ICD-10-CM | POA: Diagnosis present

## 2015-09-21 DIAGNOSIS — N390 Urinary tract infection, site not specified: Secondary | ICD-10-CM | POA: Diagnosis present

## 2015-09-21 LAB — URINALYSIS, ROUTINE W REFLEX MICROSCOPIC
Bilirubin Urine: NEGATIVE
Glucose, UA: 500 mg/dL — AB
KETONES UR: NEGATIVE mg/dL
NITRITE: POSITIVE — AB
PROTEIN: 100 mg/dL — AB
Specific Gravity, Urine: 1.027 (ref 1.005–1.030)
pH: 5.5 (ref 5.0–8.0)

## 2015-09-21 LAB — URINE MICROSCOPIC-ADD ON

## 2015-09-21 LAB — BASIC METABOLIC PANEL
Anion gap: 9 (ref 5–15)
BUN: 35 mg/dL — AB (ref 6–20)
CHLORIDE: 94 mmol/L — AB (ref 101–111)
CO2: 27 mmol/L (ref 22–32)
CREATININE: 1.24 mg/dL — AB (ref 0.44–1.00)
Calcium: 8.5 mg/dL — ABNORMAL LOW (ref 8.9–10.3)
GFR, EST AFRICAN AMERICAN: 48 mL/min — AB (ref >60)
GFR, EST NON AFRICAN AMERICAN: 41 mL/min — AB (ref >60)
Glucose, Bld: 304 mg/dL — ABNORMAL HIGH (ref 65–99)
POTASSIUM: 3.7 mmol/L (ref 3.5–5.1)
SODIUM: 130 mmol/L — AB (ref 135–145)

## 2015-09-21 LAB — CBC
HEMATOCRIT: 37.7 % (ref 36.0–46.0)
Hemoglobin: 12.4 g/dL (ref 12.0–15.0)
MCH: 29.2 pg (ref 26.0–34.0)
MCHC: 32.9 g/dL (ref 30.0–36.0)
MCV: 88.9 fL (ref 78.0–100.0)
Platelets: 216 10*3/uL (ref 150–400)
RBC: 4.24 MIL/uL (ref 3.87–5.11)
RDW: 13.1 % (ref 11.5–15.5)
WBC: 13.6 10*3/uL — AB (ref 4.0–10.5)

## 2015-09-21 LAB — PROTIME-INR
INR: 1.07 (ref 0.00–1.49)
Prothrombin Time: 13.7 seconds (ref 11.6–15.2)

## 2015-09-21 LAB — APTT: APTT: 29 s (ref 24–37)

## 2015-09-21 MED ORDER — ACETAMINOPHEN 325 MG PO TABS
650.0000 mg | ORAL_TABLET | Freq: Four times a day (QID) | ORAL | Status: DC | PRN
  Administered 2015-09-24 – 2015-09-25 (×3): 650 mg via ORAL
  Filled 2015-09-21 (×3): qty 2

## 2015-09-21 MED ORDER — ASPIRIN 325 MG PO TABS
325.0000 mg | ORAL_TABLET | Freq: Every day | ORAL | Status: DC
  Administered 2015-09-22 – 2015-09-26 (×5): 325 mg via ORAL
  Filled 2015-09-21 (×6): qty 1

## 2015-09-21 MED ORDER — HYDROCORTISONE NA SUCCINATE PF 100 MG IJ SOLR
50.0000 mg | Freq: Once | INTRAMUSCULAR | Status: AC
  Administered 2015-09-22: 50 mg via INTRAVENOUS
  Filled 2015-09-21: qty 2

## 2015-09-21 MED ORDER — PREDNISONE 10 MG PO TABS
10.0000 mg | ORAL_TABLET | Freq: Every day | ORAL | Status: DC
  Administered 2015-09-22 – 2015-09-26 (×5): 10 mg via ORAL
  Filled 2015-09-21 (×5): qty 1

## 2015-09-21 MED ORDER — ENOXAPARIN SODIUM 30 MG/0.3ML ~~LOC~~ SOLN
30.0000 mg | SUBCUTANEOUS | Status: DC
  Administered 2015-09-22: 30 mg via SUBCUTANEOUS
  Filled 2015-09-21: qty 0.3

## 2015-09-21 MED ORDER — ASPIRIN 300 MG RE SUPP
300.0000 mg | Freq: Every day | RECTAL | Status: DC
  Filled 2015-09-21: qty 1

## 2015-09-21 MED ORDER — MESALAMINE 1.2 G PO TBEC
2.4000 g | DELAYED_RELEASE_TABLET | Freq: Every day | ORAL | Status: DC
  Administered 2015-09-22 – 2015-09-26 (×5): 2.4 g via ORAL
  Filled 2015-09-21 (×6): qty 2

## 2015-09-21 MED ORDER — SODIUM CHLORIDE 0.9 % IV SOLN: INTRAVENOUS | Status: DC

## 2015-09-21 MED ORDER — STROKE: EARLY STAGES OF RECOVERY BOOK
Freq: Once | Status: AC
  Administered 2015-09-21: 22:00:00
  Filled 2015-09-21: qty 1

## 2015-09-21 MED ORDER — LORAZEPAM 2 MG/ML IJ SOLN
1.0000 mg | Freq: Once | INTRAMUSCULAR | Status: AC
Start: 2015-09-21 — End: 2015-09-21
  Administered 2015-09-21: 1 mg via INTRAVENOUS
  Filled 2015-09-21: qty 1

## 2015-09-21 MED ORDER — SENNOSIDES-DOCUSATE SODIUM 8.6-50 MG PO TABS: 1.0000 | ORAL_TABLET | Freq: Every evening | ORAL | Status: DC | PRN

## 2015-09-21 MED ORDER — ADULT MULTIVITAMIN W/MINERALS CH
1.0000 | ORAL_TABLET | Freq: Every day | ORAL | Status: DC
  Administered 2015-09-23 – 2015-09-26 (×4): 1 via ORAL
  Filled 2015-09-21 (×5): qty 1

## 2015-09-21 MED ORDER — ONDANSETRON HCL 4 MG/2ML IJ SOLN
4.0000 mg | Freq: Three times a day (TID) | INTRAMUSCULAR | Status: DC | PRN
  Administered 2015-09-22 – 2015-09-25 (×3): 4 mg via INTRAVENOUS
  Filled 2015-09-21 (×3): qty 2

## 2015-09-21 MED ORDER — CALCIUM CARBONATE-VITAMIN D 500-200 MG-UNIT PO TABS
1.0000 | ORAL_TABLET | Freq: Every day | ORAL | Status: DC
  Administered 2015-09-22 – 2015-09-26 (×5): 1 via ORAL
  Filled 2015-09-21 (×6): qty 1

## 2015-09-21 MED ORDER — FLUTICASONE PROPIONATE 50 MCG/ACT NA SUSP: 1.0000 | Freq: Every day | NASAL | Status: DC | PRN

## 2015-09-21 MED ORDER — SODIUM CHLORIDE 0.9 % IV SOLN
INTRAVENOUS | Status: DC
  Administered 2015-09-22 – 2015-09-24 (×5): via INTRAVENOUS

## 2015-09-21 MED ORDER — FAMOTIDINE 10 MG PO TABS
10.0000 mg | ORAL_TABLET | Freq: Every day | ORAL | Status: DC
  Administered 2015-09-22 – 2015-09-26 (×5): 10 mg via ORAL
  Filled 2015-09-21 (×5): qty 1

## 2015-09-21 NOTE — ED Notes (Signed)
Patient transported to MRI 

## 2015-09-21 NOTE — Telephone Encounter (Signed)
Patient called and stated that she just saw you at [facility]. She states she is no better and feels like she needs to go to the hospital to get fluids. States she is just not feeling well. Please Advise.

## 2015-09-21 NOTE — ED Notes (Signed)
Bed: [id number] Expected date: 09/21/15 Expected time: 3:24 PM Means of arrival: Ambulance Comments: 75 yo fall due to dizziness

## 2015-09-21 NOTE — H&P (Addendum)
History and Physical    Tammy Beard [id number] DOB: [birth year] DOA: 09/21/2015  Referring MD/NP/PA:   PCP: [name], MD   Patient coming from:  The patient is coming from [facility].  At baseline, pt is partially dependent for most of ADL.      Chief Complaint: Dizziness, nausea and vomiting  HPI: Tammy Beard is a 75 y.o. female with medical history significant of hypertension, hyperlipidemia, GERD, ulcerative colitis, osteoporosis, heart murmur, melanoma, polio, polymyalgia, rheumatoid arthritis, poor hearing, who presents with dizziness, nausea and vomiting.  Patient has been having dizziness and poor balance for 5 days. Patient has a feeling that the room is spinning around her. She also has nausea and vomited 3 times on Tuesday, but no vomiting today. Patient does not have diarrhea or abdominal pain. She does not have unilateral weakness, numbness or tingling sensations. She has poor hearing, which is chronic issue, no vision change.   Pt states that she was answering door, when it opened and hit her on her head today. Pt sat down afterwards and later fell. No LOC, or neck/back pain. Patient does not have chest pain, shortness of breath, cough, symptoms of UTI. Per report, EMS found pt was slightly orthostatic (systolic BP dropped from 123XX123 to 106). EMS gave 473ml NS and zofran for nausea. Pt was given prescription dimenhydrinate by PCP. She states that she did not get this medication until today, but states that she got it from her friend and took it without help.  ED Course: pt was found to have WBC 13.6, temperature normal, transient bradycardia, AKI with cre 1.24 and BUN 35, pending UA and urine culture. CT head is negative for acute intracranial abnormalities. MRI of brain has motion degradation, but showed questionable tiny acute infarct medial aspect posterior right frontal,-parietal lobe junction; remote infarct superior vermis. Pt is placed on  tele bed for obs. Neuro was consulted.  Review of Systems:   General: no fevers, chills, no changes in body weight, has fatigue HEENT: no blurry vision, hearing changes or sore throat Pulm: no dyspnea, coughing, wheezing CV: no chest pain, no palpitations Abd: has nausea, vomiting, no abdominal pain, diarrhea, constipation GU: no dysuria, burning on urination, increased urinary frequency, hematuria  Ext: no leg edema Neuro: no unilateral weakness, numbness, or tingling, no vision change or hearing loss. Has dizziness. Skin: no rash MSK: No muscle spasm, no deformity, no limitation of range of movement in spin Heme: No easy bruising.  Travel history: No recent long distant travel.  Allergy: No Known Allergies  Past Medical History  Diagnosis Date  . Ulcerative colitis   . Osteopenia     s/p 7 yrs Fosamax  . Hypertension   . Hyperlipidemia   . Sleep disorder   . GERD (gastroesophageal reflux disease)   . Heart murmur   . Melanoma ([place])   . Polio   . Colitis   . Rheumatic fever   . Polymyalgia [facility])     Past Surgical History  Procedure Laterality Date  . Breast lumpectomy  1986    left; benign  . Neck surgery  1986    Benign growth on neck removed  . Dilation and curettage of uterus  12/1999  . Colonoscopy  2006  . Tonsillectomy    . Melanoma excision  2007    left arm  . Cataract extraction      Left eye   . Colonosopy  07/05/13    Dr. Deatra Ina  Social History:  reports that she has never smoked. She has never used smokeless tobacco. She reports that she drinks alcohol. She reports that she does not use illicit drugs.  Family History:  Family History  Problem Relation Age of Onset  . Diabetes Paternal Grandmother   . Cancer Paternal Aunt     unaware of primary site  . Other Mother     Coad/ Bronchiectosis  . Coronary artery disease Neg Hx   . Colon cancer Neg Hx   . Rectal cancer Neg Hx   . Stomach cancer Neg Hx   . Pancreatic cancer Other       Prior to Admission medications   Medication Sig Start Date End Date Taking? Authorizing Provider  acetaminophen (TYLENOL) 325 MG tablet Take 650 mg by mouth every 6 (six) hours as needed.   Yes Historical Provider, MD  benazepril-hydrochlorthiazide (LOTENSIN HCT) 20-12.5 MG tablet Take 0.5 tablets by mouth daily.   Yes Historical Provider, MD  Calcium Carbonate-Vitamin D (CALCIUM + D PO) Take 1 tablet by mouth daily.    Yes Historical Provider, MD  dimenhyDRINATE (DRAMAMINE) 50 MG tablet Take 1 tablet every 6 hours as needed for nausea or dizziness 09/18/15  Yes [name], MD  fluticasone [facility]) 50 MCG/ACT nasal spray Place 1 spray into both nostrils daily as needed for allergies or rhinitis.   Yes Historical Provider, MD  mesalamine (LIALDA) 1.2 g EC tablet take 2 tablets by mouth daily with BREAKFAST 04/26/15  Yes [name], MD  Multiple Vitamin (MULTIVITAMIN) tablet Take 1 tablet by mouth daily.     Yes Historical Provider, MD  predniSONE (DELTASONE) 5 MG tablet TWO TABLETs DAILY TO TREAT POLYMYALGIA RHEUMATICA Patient taking differently: Take 10 mg by mouth daily.  08/28/15  Yes [name], MD  ranitidine (ZANTAC) 150 MG tablet One twice daily to reduce stomach acid Patient taking differently: Take 150 mg by mouth 2 (two) times daily.  05/01/15  Yes [name], MD    Physical Exam: Filed Vitals:   09/21/15 1757 09/21/15 1940 09/21/15 2030 09/21/15 2100  BP: 144/49 146/53 143/59 147/54  Pulse: 79 84  102  Temp: 97.2 F (36.2 C)     TempSrc: Oral     Resp: 17 18  23   SpO2: 97% 98%  95%   General: Not in acute distress HEENT:       Eyes: PERRL, EOMI, no scleral icterus.       ENT: No discharge from the ears and nose, no pharynx injection, no tonsillar enlargement.        Neck: No JVD, no bruit, no mass felt. Heme: No neck lymph node enlargement. Cardiac: S1/S2, RRR, has 3/6 systolic murmurs, No gallops or rubs. Pulm:  No rales, wheezing, rhonchi or rubs. Abd:  Soft, nondistended, nontender, no rebound pain, no organomegaly, BS present. GU: No hematuria Ext: No pitting leg edema bilaterally. 2+DP/PT pulse bilaterally. Musculoskeletal: No joint deformities, No joint redness or warmth, no limitation of ROM in spin. Skin: No rashes.  Neuro: Alert, oriented X3, cranial nerves II-XII grossly intact, moves all extremities normally. Muscle strength 5/5 in all extremities, sensation to light touch intact. Brachial reflex 2+ bilaterally. Negative Babinski's sign. Has unsteady gait. Psych: Patient is not psychotic, no suicidal or hemocidal ideation.  Labs on Admission: I have personally reviewed following labs and imaging studies  CBC:  Recent Labs Lab 09/21/15 1554  WBC 13.6*  HGB 12.4  HCT 37.7  MCV 88.9  PLT 123XX123   Basic Metabolic Panel:  Recent Labs Lab 09/21/15 1554  NA 130*  K 3.7  CL 94*  CO2 27  GLUCOSE 304*  BUN 35*  CREATININE 1.24*  CALCIUM 8.5*   GFR: CrCl cannot be calculated (Unknown ideal weight.). Liver Function Tests: No results for input(s): AST, ALT, ALKPHOS, BILITOT, PROT, ALBUMIN in the last 168 hours. No results for input(s): LIPASE, AMYLASE in the last 168 hours. No results for input(s): AMMONIA in the last 168 hours. Coagulation Profile: No results for input(s): INR, PROTIME in the last 168 hours. Cardiac Enzymes: No results for input(s): CKTOTAL, CKMB, CKMBINDEX, TROPONINI in the last 168 hours. BNP (last 3 results) No results for input(s): PROBNP in the last 8760 hours. HbA1C: No results for input(s): HGBA1C in the last 72 hours. CBG: No results for input(s): GLUCAP in the last 168 hours. Lipid Profile: No results for input(s): CHOL, HDL, LDLCALC, TRIG, CHOLHDL, LDLDIRECT in the last 72 hours. Thyroid Function Tests: No results for input(s): TSH, T4TOTAL, FREET4, T3FREE, THYROIDAB in the last 72 hours. Anemia Panel: No results for input(s): VITAMINB12, FOLATE, FERRITIN, TIBC, IRON, RETICCTPCT in the  last 72 hours. Urine analysis:    Component Value Date/Time   COLORURINE yellow 09/06/2009 1459   APPEARANCEUR Clear 09/06/2009 1459   LABSPEC 1.010 09/06/2009 1459   PHURINE 7.0 09/06/2009 1459   HGBUR negative 09/06/2009 1459   BILIRUBINUR NEG 08/23/2010 1440   BILIRUBINUR negative 09/06/2009 1459   PROTEINUR NEG 08/23/2010 1440   UROBILINOGEN 0.2 08/23/2010 1440   UROBILINOGEN 0.2 09/06/2009 1459   NITRITE POS 08/23/2010 1440   NITRITE negative 09/06/2009 1459   LEUKOCYTESUR NEG 08/23/2010 1440   Sepsis Labs: @LABRCNTIP (procalcitonin:4,lacticidven:4) )No results found for this or any previous visit (from the past 240 hour(s)).   Radiological Exams on Admission: Dg Chest 2 View  09/21/2015  CLINICAL DATA:  Fall. Left rib and chest pain and tenderness. Initial encounter. EXAM: CHEST  2 VIEW COMPARISON:  05/17/2014 FINDINGS: The heart size and mediastinal contours are within normal limits. Aortic atherosclerosis noted. Changes of COPD are again demonstrated. No evidence of pulmonary infiltrate or edema. No evidence of pneumothorax or pleural effusion. Both lungs are clear. The visualized skeletal structures are unremarkable. IMPRESSION: COPD.  No evidence of pneumothorax or other acute findings. Aortic atherosclerosis noted. Electronically Signed   By: Earle Gell M.D.   On: 09/21/2015 17:43   Ct Head Wo Contrast  09/21/2015  CLINICAL DATA:  Struck in head with a door, no loss of consciousness, stepped down afterwards and later fell, dizziness for past week, history hypertension, ulcer colitis, melanoma EXAM: CT HEAD WITHOUT CONTRAST TECHNIQUE: Contiguous axial images were obtained from the base of the skull through the vertex without intravenous contrast. 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 Ventricles otherwise normal morphology. No midline shift or mass effect. Otherwise normal appearance of brain parenchyma. No intracranial  hemorrhage, mass lesion or evidence acute infarction. No extra-axial fluid collections. Visualized paranasal sinuses and mastoid air cells clear. Atherosclerotic calcifications at the carotid siphons. Bones appear intact. IMPRESSION: No acute intracranial abnormalities. Generalized atrophy. Mega cisterna magna. Electronically Signed   By: Lavonia Dana M.D.   On: 09/21/2015 17:48   Mr Brain Wo Contrast  09/21/2015  CLINICAL DATA:  75 year hypertensive female hit in head by door. Sat down afterwards and fell. Dizziness for past week. Subsequent encounter. EXAM: MRI HEAD WITHOUT CONTRAST TECHNIQUE: Multiplanar, multiecho pulse sequences of  the brain and surrounding structures were obtained without intravenous contrast. COMPARISON:  09/21/2015 head CT.  No comparison brain MR. FINDINGS: Patient was not able to complete the examination. Sequences obtained are motion degraded. Question tiny acute infarct medial aspect posterior right frontal -parietal lobe junction. Remote infarct superior vermis. No intracranial hemorrhage. Mild chronic microvascular changes. Moderate global atrophy without hydrocephalus. No intracranial mass lesion noted on this unenhanced exam. Mega cisterna magna incidentally noted. Major intracranial vascular structures are patent. Post lens replacement without acute orbital abnormality. Partial opacification inferior mastoid air cells bilaterally without obstructing lesion noted. Minimal mucosal thickening ethmoid sinus air cells. IMPRESSION: Patient was not able to complete the examination. Sequences obtained are motion degraded. Question tiny acute infarct medial aspect posterior right frontal -parietal lobe junction. Remote infarct superior vermis. No intracranial hemorrhage. Mild chronic microvascular changes. Moderate global atrophy without hydrocephalus. Electronically Signed   By: Genia Del M.D.   On: 09/21/2015 19:50     EKG: Independently reviewed. Sinus rhythm, QTC 439,  PAC.  Assessment/Plan Principal Problem:   Stroke [facility]) Active Problems:   Dyslipidemia   Essential hypertension   Ulcerative colitis ([place])   Polymyalgia rheumatica (HCC)   Heart murmur   Dizzy   AKI (acute kidney injury) ([place])   Stroke [facility]): Patient's dizziness is most likely caused by stroke, as evidenced by MRI of brain. Patient also has a positive orthostatic status, which may have partially contributed. Neurology, Dr. Nicole Kindred was consulted.  - will place on tele bed for obs - Neurology was consulted by EDP, will follow up recommendations. -Atrial fibrillation: not present  -tPA was not given because symptoms are out of window for tPA - Risk factor modification: HgbA1c, fasting lipid panel - MRA of the brain without contrast  - PT consult, OT consult - Bedside swallowing screen was ordered, will get speech consult in AM - 2 d Echocardiogram  - Ekg  - Carotid dopplers  - Aspirin - IVF: NS 100 cc/h - fall precuation  HLD: Last LDL was 63 on 08/23/15. Patient is not taking medications at home. -Check FLP  Essential hypertension: Blood pressure 146/53  -Hold Lotensin-HCTZ in the setting of acute stroke, to allow permissive hypertension; in addition patient has AKI  Ulcerative colitis ([place]): no diarrhea or abdominal pain. -continue hold mesalamine   Plymyalgia rheumatica (HCC) -Continue home prednisone 10 mg daily -give Solu-Cortef 50 mg 1 as a stress dose -check cortisol level  AKI: Likely due to prerenal secondary to dehydration and continuation of ACEI and diruetics - IVF as above - Check FeUrea - Follow up renal function by BMP - Hold Lotensin-HCTZ  GERD: -Pepcid  DVT ppx: SQ Lovenox Code Status: DNR Family Communication:  Yes, patient's sister at bed side Disposition Plan:  Anticipate discharge back to previous [facility] independent living. Consults called:  Neuro,DrNicole Kindred Admission status: Obs / tele    Date of Service 09/21/2015    Ivor Costa [facility] Pager [phone]  If 7PM-7AM, please contact night-coverage [email or web address] Password [facility] 09/21/2015, 9:42 PM

## 2015-09-21 NOTE — Progress Notes (Signed)
Received report from  Elvina Sidle ED RN.

## 2015-09-21 NOTE — ED Provider Notes (Signed)
CSN: [id number]     Arrival date & time 09/21/15  1526 History   First MD Initiated Contact with Patient 09/21/15 1613     Chief Complaint  Patient presents with  . Head Injury  . Dizziness     (Consider location/radiation/quality/duration/timing/severity/associated sxs/prior Treatment) HPI   Dizziness, trouble getting to door. Tried to open door and hit head with door.  Started feeling dizzy, couldn't sign paper  Saw Dr. On Tuesday because threw up 3 times, had dizziness and difficulty standing. Has not had emesis since that time but has not been able to eat or drink myuch, dizziness has increased. Difficulty walking, feels off balance like going to fall over.  Not feeling lightheaded.  Told to take dramamine, was getting delivery today.   No syncope.   Headaches off and on, not sure exactly how long, have been side of head, last 2 weeks. Mild headache, take tylenol and feels better.  No numbness/weakness, no new coordination difficulties  Difficulty walking since Monday night and dizziness Television color is darker, no double vision, no visual field defect  Past Medical History  Diagnosis Date  . Ulcerative colitis   . Osteopenia     s/p 7 yrs Fosamax  . Hypertension   . Hyperlipidemia   . Sleep disorder   . GERD (gastroesophageal reflux disease)   . Heart murmur   . Polio   . Colitis   . Rheumatic fever   . Polymyalgia [facility])         Past Surgical History  Procedure Laterality Date  . Breast lumpectomy  1986    left; benign  . Neck surgery  1986    Benign growth on neck removed  . Dilation and curettage of uterus  12/1999  . Colonoscopy  2006  . Tonsillectomy    . Melanoma excision  2007    left arm  . Cataract extraction      Left eye   . Colonosopy  07/05/13    Dr. Deatra Ina   Family History  Problem Relation Age of Onset  . Diabetes Paternal Grandmother   . Cancer Paternal Aunt     unaware of primary site  . Other Mother     Coad/ Bronchiectosis   . Coronary artery disease Neg Hx   . Colon cancer Neg Hx   . Rectal cancer Neg Hx   . Stomach cancer Neg Hx   . Pancreatic cancer Other    Social History  Substance Use Topics  . Smoking status: Never Smoker   . Smokeless tobacco: Never Used  . Alcohol Use: Yes     Comment: occasional   OB History    No data available     Review of Systems  Constitutional: Negative for fever.  HENT: Positive for rhinorrhea. Negative for sore throat.   Eyes: Negative for visual disturbance.  Respiratory: Negative for cough (mild, chronic. unchanged) and shortness of breath.   Cardiovascular: Negative for chest pain.  Gastrointestinal: Positive for nausea and vomiting. Negative for abdominal pain, diarrhea and constipation.  Genitourinary: Negative for difficulty urinating.  Musculoskeletal: Positive for gait problem. Negative for back pain and neck pain.  Skin: Negative for rash.  Neurological: Positive for dizziness and headaches. Negative for syncope, facial asymmetry, speech difficulty, weakness and numbness.      Allergies  Review of patient's allergies indicates no known allergies.  Home Medications   Prior to Admission medications   Medication Sig Start Date End Date Taking? Authorizing Provider  acetaminophen (  TYLENOL) 325 MG tablet Take 650 mg by mouth every 6 (six) hours as needed.   Yes Historical Provider, MD  benazepril-hydrochlorthiazide (LOTENSIN HCT) 20-12.5 MG tablet Take 0.5 tablets by mouth daily.   Yes Historical Provider, MD  Calcium Carbonate-Vitamin D (CALCIUM + D PO) Take 1 tablet by mouth daily.    Yes Historical Provider, MD  dimenhyDRINATE (DRAMAMINE) 50 MG tablet Take 1 tablet every 6 hours as needed for nausea or dizziness 09/18/15  Yes [name], MD  fluticasone [facility]) 50 MCG/ACT nasal spray Place 1 spray into both nostrils daily as needed for allergies or rhinitis.   Yes Historical Provider, MD  mesalamine (LIALDA) 1.2 g EC tablet take 2 tablets by  mouth daily with BREAKFAST 04/26/15  Yes [name], MD  Multiple Vitamin (MULTIVITAMIN) tablet Take 1 tablet by mouth daily.     Yes Historical Provider, MD  predniSONE (DELTASONE) 5 MG tablet TWO TABLETs DAILY TO TREAT POLYMYALGIA RHEUMATICA Patient taking differently: Take 10 mg by mouth daily.  08/28/15  Yes [name], MD  ranitidine (ZANTAC) 150 MG tablet One twice daily to reduce stomach acid Patient taking differently: Take 150 mg by mouth 2 (two) times daily.  05/01/15  Yes [name], MD   BP 93/73 mmHg  Pulse 83  Temp(Src) 98.9 F (37.2 C) (Oral)  Resp 17  Ht 5\' 1"  (1.549 m)  Wt 121 lb 14.6 oz (55.3 kg)  BMI 23.05 kg/m2  SpO2 97% Physical Exam  Constitutional: She is oriented to person, place, and time. She appears well-developed and well-nourished. No distress.  HENT:  Head: Normocephalic and atraumatic.  Eyes: Conjunctivae and EOM are normal.  Neck: Normal range of motion.  Cardiovascular: Normal rate, regular rhythm and intact distal pulses.  Exam reveals no gallop and no friction rub.   Murmur heard. Pulmonary/Chest: Effort normal and breath sounds normal. No respiratory distress. She has no wheezes. She has no rales. She exhibits tenderness.  Abdominal: Soft. She exhibits no distension. There is no tenderness. There is no guarding.  Musculoskeletal: She exhibits no edema or tenderness.  Neurological: She is alert and oriented to person, place, and time. She has normal strength. No cranial nerve deficit or sensory deficit. Coordination normal. GCS eye subscore is 4. GCS verbal subscore is 5. GCS motor subscore is 6.  Deferred gait due to fall risk, known abnormality  Skin: Skin is warm and dry. No rash noted. She is not diaphoretic. No erythema.  Nursing note and vitals reviewed.   ED Course  Procedures (including critical care time) Labs Review Labs Reviewed  BASIC METABOLIC PANEL - Abnormal; Notable for the following:    Sodium 130 (*)    Chloride 94  (*)    Glucose, Bld 304 (*)    BUN 35 (*)    Creatinine, Ser 1.24 (*)    Calcium 8.5 (*)    GFR calc non Af Amer 41 (*)    GFR calc Af Amer 48 (*)    All other components within normal limits  CBC - Abnormal; Notable for the following:    WBC 13.6 (*)    All other components within normal limits  URINALYSIS, ROUTINE W REFLEX MICROSCOPIC (NOT AT [facility]) - Abnormal; Notable for the following:    APPearance CLOUDY (*)    Glucose, UA 500 (*)    Hgb urine dipstick LARGE (*)    Protein, ur 100 (*)    Nitrite POSITIVE (*)    Leukocytes,  UA MODERATE (*)    All other components within normal limits  URINE MICROSCOPIC-ADD ON - Abnormal; Notable for the following:    Squamous Epithelial / LPF 0-5 (*)    Bacteria, UA MANY (*)    All other components within normal limits  BASIC METABOLIC PANEL - Abnormal; Notable for the following:    Sodium 133 (*)    Chloride 97 (*)    Glucose, Bld 123 (*)    Creatinine, Ser 1.07 (*)    Calcium 8.5 (*)    GFR calc non Af Amer 49 (*)    GFR calc Af Amer 57 (*)    All other components within normal limits  CBC WITH DIFFERENTIAL/PLATELET - Abnormal; Notable for the following:    Hemoglobin 11.8 (*)    All other components within normal limits  GLUCOSE, CAPILLARY - Abnormal; Notable for the following:    Glucose-Capillary 149 (*)    All other components within normal limits  MRSA PCR SCREENING  URINE CULTURE  CREATININE, URINE, RANDOM  PROTIME-INR  APTT  MAGNESIUM  UREA NITROGEN, URINE  HEMOGLOBIN A1C  LIPID PANEL  CORTISOL-AM, BLOOD    Imaging Review Dg Chest 2 View  09/21/2015  CLINICAL DATA:  Fall. Left rib and chest pain and tenderness. Initial encounter. EXAM: CHEST  2 VIEW COMPARISON:  05/17/2014 FINDINGS: The heart size and mediastinal contours are within normal limits. Aortic atherosclerosis noted. Changes of COPD are again demonstrated. No evidence of pulmonary infiltrate or edema. No evidence of pneumothorax or pleural effusion. Both  lungs are clear. The visualized skeletal structures are unremarkable. IMPRESSION: COPD.  No evidence of pneumothorax or other acute findings. Aortic atherosclerosis noted. Electronically Signed   By: Earle Gell M.D.   On: 09/21/2015 17:43   Ct Head Wo Contrast  09/21/2015  CLINICAL DATA:  Struck in head with a door, no loss of consciousness, stepped down afterwards and later fell, dizziness for past week, history hypertension, ulcer colitis, melanoma EXAM: CT HEAD WITHOUT CONTRAST TECHNIQUE: Contiguous axial images were obtained from the base of the skull through the vertex without intravenous contrast. 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 Ventricles otherwise normal morphology. No midline shift or mass effect. Otherwise normal appearance of brain parenchyma. No intracranial hemorrhage, mass lesion or evidence acute infarction. No extra-axial fluid collections. Visualized paranasal sinuses and mastoid air cells clear. Atherosclerotic calcifications at the carotid siphons. Bones appear intact. IMPRESSION: No acute intracranial abnormalities. Generalized atrophy. Mega cisterna magna. Electronically Signed   By: Lavonia Dana M.D.   On: 09/21/2015 17:48   Mr Brain Wo Contrast  09/21/2015  CLINICAL DATA:  38 year hypertensive female hit in head by door. Sat down afterwards and fell. Dizziness for past week. Subsequent encounter. EXAM: MRI HEAD WITHOUT CONTRAST TECHNIQUE: Multiplanar, multiecho pulse sequences of the brain and surrounding structures were obtained without intravenous contrast. COMPARISON:  09/21/2015 head CT.  No comparison brain MR. FINDINGS: Patient was not able to complete the examination. Sequences obtained are motion degraded. Question tiny acute infarct medial aspect posterior right frontal -parietal lobe junction. Remote infarct superior vermis. No intracranial hemorrhage. Mild chronic microvascular changes. Moderate global  atrophy without hydrocephalus. No intracranial mass lesion noted on this unenhanced exam. Mega cisterna magna incidentally noted. Major intracranial vascular structures are patent. Post lens replacement without acute orbital abnormality. Partial opacification inferior mastoid air cells bilaterally without obstructing lesion noted. Minimal mucosal thickening ethmoid sinus air cells. IMPRESSION: Patient was not  able to complete the examination. Sequences obtained are motion degraded. Question tiny acute infarct medial aspect posterior right frontal -parietal lobe junction. Remote infarct superior vermis. No intracranial hemorrhage. Mild chronic microvascular changes. Moderate global atrophy without hydrocephalus. Electronically Signed   By: Genia Del M.D.   On: 09/21/2015 19:50   Mr Jodene Nam Head/brain Wo Cm  09/22/2015  CLINICAL DATA:  Initial evaluation for the acute stroke. EXAM: MRA HEAD WITHOUT CONTRAST TECHNIQUE: Angiographic images of the Circle of Willis were obtained using MRA technique without intravenous contrast. COMPARISON:  Prior MRI from 09/21/2015. FINDINGS: ANTERIOR CIRCULATION: Study is severely limited by motion artifact. Distal cervical segments of the internal carotid arteries are patent with antegrade flow. Petrous segments grossly patent. Cavernous and supraclinoid segments patent without obvious high-grade stenosis, although evaluation is fairly limited. A1 segments are grossly patent. The left A1 segment appears to be hypoplastic. The anterior cerebral arteries are grossly patent to their mid- distal aspect. M1 segments patent without occlusion. Evaluation for focal severe stenosis is fairly limited on this exam. MCA bifurcations not well evaluated. Distal MCA branches not well evaluated. POSTERIOR CIRCULATION: Vertebral arteries grossly patent to the vertebrobasilar junction. Posterior inferior cerebral arteries not well evaluated. Basilar patent to its distal aspect. Superior cerebral  arteries are grossly patent proximally. The posterior cerebral arteries both arise in the basilar artery and are well opacified to their distal aspects. The right PCA is somewhat attenuated distally as compared to the left. No obvious aneurysm or vascular malformation. IMPRESSION: Severely limited study due to motion artifact. No large or proximal arterial branch occlusion. No definite high-grade or correctable stenosis, although evaluation is fairly limited. Electronically Signed   By: Jeannine Boga M.D.   On: 09/22/2015 05:10   I have personally reviewed and evaluated these images and lab results as part of my medical decision-making.   EKG Interpretation   Date/Time:  Friday September 21 2015 16:04:54 EDT Ventricular Rate:  99 PR Interval:    QRS Duration: 102 QT Interval:  404 QTC Calculation: 439 R Axis:   65 Text Interpretation:  Sinus rhythm Multiform ventricular premature  complexes Probable left ventricular hypertrophy Probable inferior infarct,  old No previous ECGs available Confirmed by [facility] MD, Keyonni Percival ([id number]) on  09/21/2015 4:15:16 PM      MDM   Final diagnoses:  Dizzy  Cerebrovascular accident (CVA), unspecified mechanism ([place])  Disequilibrium   75 year old female with a history of ulcerative colitis, hypertension, hyperlipidemia, pulmonology rheumatica, rheumatic fever, resents with concern for dizziness and difficulty ambulating. Patient describes a sensation as feeling off balance and has been present since Monday, and resulted in a fall today. Patient with a sinus arrhythmia with heart rate ranging from 40s to 100 in the emergency department, however without signs of other arrhythmia or atrial fibrillation. There is no anemia, no significant Y abnormality to account for her symptoms. She denies any symptoms of lightheadedness, and rather describes symptoms of disequilibrium. I do not detect other abnormalities on neurologic exam, and deferred gait given her stated  difficulties ambulating. A CT head was done given her history of trauma as well as headache and showed no acute abnormalities. MR brain is limited by patient movement and patient unable to finish the scan, however is concerning for a questionable acute infarct in the left frontal parietal region, as well as in the in older infarct in the vermis. Discussed with neurology Dr. Nicole Kindred, and will consult hospitalist for admission and transfer to [facility] for further stroke  evaluation.  Gareth Morgan, MD 09/22/15 1255

## 2015-09-21 NOTE — ED Notes (Signed)
Pt was notified about Urinalysis.

## 2015-09-21 NOTE — ED Notes (Signed)
Per EMS. Pt from [facility]. Pt was answering door when it opened and hit her on the head. No falls, LOC, blood thinners or neck/back pain. Pt sat down afterwards and later fell. Pt has had dizziness for the past week and has seen her pcp for this. Has rx for dramamine for symptoms, took this with no improvement in symptoms. EMS found pt was slightly orthostatic (systolic BP dropped from [id number] to 106). EMS gave 493ml NS and zofran for nausea.

## 2015-09-21 NOTE — ED Notes (Signed)
Pt made an attempt to urinate. Was not able.

## 2015-09-21 NOTE — ED Notes (Signed)
Pt aware of need for urine sample. Will call when ready

## 2015-09-21 NOTE — ED Notes (Signed)
[facility] was notified of pt's transfer to [facility]. 

## 2015-09-22 ENCOUNTER — Observation Stay (HOSPITAL_COMMUNITY): Payer: Medicare Other

## 2015-09-22 ENCOUNTER — Encounter (HOSPITAL_COMMUNITY): Payer: Self-pay | Admitting: Emergency Medicine

## 2015-09-22 DIAGNOSIS — I771 Stricture of artery: Secondary | ICD-10-CM | POA: Diagnosis present

## 2015-09-22 DIAGNOSIS — M6281 Muscle weakness (generalized): Secondary | ICD-10-CM | POA: Diagnosis not present

## 2015-09-22 DIAGNOSIS — R262 Difficulty in walking, not elsewhere classified: Secondary | ICD-10-CM | POA: Diagnosis present

## 2015-09-22 DIAGNOSIS — R2681 Unsteadiness on feet: Secondary | ICD-10-CM | POA: Diagnosis not present

## 2015-09-22 DIAGNOSIS — R29700 NIHSS score 0: Secondary | ICD-10-CM | POA: Diagnosis present

## 2015-09-22 DIAGNOSIS — T502X5A Adverse effect of carbonic-anhydrase inhibitors, benzothiadiazides and other diuretics, initial encounter: Secondary | ICD-10-CM | POA: Diagnosis present

## 2015-09-22 DIAGNOSIS — S0990XA Unspecified injury of head, initial encounter: Secondary | ICD-10-CM | POA: Diagnosis not present

## 2015-09-22 DIAGNOSIS — I63321 Cerebral infarction due to thrombosis of right anterior cerebral artery: Secondary | ICD-10-CM | POA: Diagnosis not present

## 2015-09-22 DIAGNOSIS — I6521 Occlusion and stenosis of right carotid artery: Secondary | ICD-10-CM | POA: Diagnosis not present

## 2015-09-22 DIAGNOSIS — H9190 Unspecified hearing loss, unspecified ear: Secondary | ICD-10-CM | POA: Diagnosis present

## 2015-09-22 DIAGNOSIS — K219 Gastro-esophageal reflux disease without esophagitis: Secondary | ICD-10-CM | POA: Diagnosis present

## 2015-09-22 DIAGNOSIS — G459 Transient cerebral ischemic attack, unspecified: Secondary | ICD-10-CM | POA: Diagnosis not present

## 2015-09-22 DIAGNOSIS — I10 Essential (primary) hypertension: Secondary | ICD-10-CM

## 2015-09-22 DIAGNOSIS — A498 Other bacterial infections of unspecified site: Secondary | ICD-10-CM | POA: Diagnosis not present

## 2015-09-22 DIAGNOSIS — I63039 Cerebral infarction due to thrombosis of unspecified carotid artery: Secondary | ICD-10-CM | POA: Diagnosis not present

## 2015-09-22 DIAGNOSIS — E871 Hypo-osmolality and hyponatremia: Secondary | ICD-10-CM | POA: Diagnosis not present

## 2015-09-22 DIAGNOSIS — I63019 Cerebral infarction due to thrombosis of unspecified vertebral artery: Secondary | ICD-10-CM

## 2015-09-22 DIAGNOSIS — N179 Acute kidney failure, unspecified: Secondary | ICD-10-CM

## 2015-09-22 DIAGNOSIS — N3000 Acute cystitis without hematuria: Secondary | ICD-10-CM | POA: Diagnosis not present

## 2015-09-22 DIAGNOSIS — I471 Supraventricular tachycardia: Secondary | ICD-10-CM | POA: Diagnosis not present

## 2015-09-22 DIAGNOSIS — N390 Urinary tract infection, site not specified: Secondary | ICD-10-CM | POA: Diagnosis not present

## 2015-09-22 DIAGNOSIS — Y929 Unspecified place or not applicable: Secondary | ICD-10-CM | POA: Diagnosis not present

## 2015-09-22 DIAGNOSIS — Z8612 Personal history of poliomyelitis: Secondary | ICD-10-CM | POA: Diagnosis not present

## 2015-09-22 DIAGNOSIS — R001 Bradycardia, unspecified: Secondary | ICD-10-CM | POA: Diagnosis not present

## 2015-09-22 DIAGNOSIS — M810 Age-related osteoporosis without current pathological fracture: Secondary | ICD-10-CM | POA: Diagnosis not present

## 2015-09-22 DIAGNOSIS — M353 Polymyalgia rheumatica: Secondary | ICD-10-CM

## 2015-09-22 DIAGNOSIS — R402362 Coma scale, best motor response, obeys commands, at arrival to emergency department: Secondary | ICD-10-CM | POA: Diagnosis present

## 2015-09-22 DIAGNOSIS — I672 Cerebral atherosclerosis: Secondary | ICD-10-CM | POA: Diagnosis present

## 2015-09-22 DIAGNOSIS — I639 Cerebral infarction, unspecified: Secondary | ICD-10-CM | POA: Diagnosis not present

## 2015-09-22 DIAGNOSIS — I6789 Other cerebrovascular disease: Secondary | ICD-10-CM | POA: Diagnosis not present

## 2015-09-22 DIAGNOSIS — R41841 Cognitive communication deficit: Secondary | ICD-10-CM | POA: Diagnosis not present

## 2015-09-22 DIAGNOSIS — K5190 Ulcerative colitis, unspecified, without complications: Secondary | ICD-10-CM

## 2015-09-22 DIAGNOSIS — R42 Dizziness and giddiness: Secondary | ICD-10-CM

## 2015-09-22 DIAGNOSIS — Z8673 Personal history of transient ischemic attack (TIA), and cerebral infarction without residual deficits: Secondary | ICD-10-CM | POA: Diagnosis not present

## 2015-09-22 DIAGNOSIS — R402252 Coma scale, best verbal response, oriented, at arrival to emergency department: Secondary | ICD-10-CM | POA: Diagnosis present

## 2015-09-22 DIAGNOSIS — M25511 Pain in right shoulder: Secondary | ICD-10-CM | POA: Diagnosis not present

## 2015-09-22 DIAGNOSIS — E860 Dehydration: Secondary | ICD-10-CM | POA: Diagnosis not present

## 2015-09-22 DIAGNOSIS — Z7952 Long term (current) use of systemic steroids: Secondary | ICD-10-CM | POA: Diagnosis not present

## 2015-09-22 DIAGNOSIS — R011 Cardiac murmur, unspecified: Secondary | ICD-10-CM | POA: Diagnosis not present

## 2015-09-22 DIAGNOSIS — R402142 Coma scale, eyes open, spontaneous, at arrival to emergency department: Secondary | ICD-10-CM | POA: Diagnosis present

## 2015-09-22 DIAGNOSIS — R1312 Dysphagia, oropharyngeal phase: Secondary | ICD-10-CM | POA: Diagnosis not present

## 2015-09-22 DIAGNOSIS — W19XXXA Unspecified fall, initial encounter: Secondary | ICD-10-CM | POA: Diagnosis present

## 2015-09-22 DIAGNOSIS — Z66 Do not resuscitate: Secondary | ICD-10-CM | POA: Diagnosis present

## 2015-09-22 DIAGNOSIS — G479 Sleep disorder, unspecified: Secondary | ICD-10-CM | POA: Diagnosis present

## 2015-09-22 DIAGNOSIS — A419 Sepsis, unspecified organism: Secondary | ICD-10-CM | POA: Diagnosis present

## 2015-09-22 DIAGNOSIS — Z85820 Personal history of malignant melanoma of skin: Secondary | ICD-10-CM | POA: Diagnosis not present

## 2015-09-22 DIAGNOSIS — E785 Hyperlipidemia, unspecified: Secondary | ICD-10-CM | POA: Diagnosis not present

## 2015-09-22 DIAGNOSIS — M069 Rheumatoid arthritis, unspecified: Secondary | ICD-10-CM | POA: Diagnosis present

## 2015-09-22 LAB — CBC WITH DIFFERENTIAL/PLATELET
BASOS ABS: 0 10*3/uL (ref 0.0–0.1)
BASOS PCT: 0 %
EOS ABS: 0 10*3/uL (ref 0.0–0.7)
Eosinophils Relative: 0 %
HEMATOCRIT: 37.4 % (ref 36.0–46.0)
HEMOGLOBIN: 11.8 g/dL — AB (ref 12.0–15.0)
Lymphocytes Relative: 8 %
Lymphs Abs: 0.7 10*3/uL (ref 0.7–4.0)
MCH: 28.8 pg (ref 26.0–34.0)
MCHC: 31.6 g/dL (ref 30.0–36.0)
MCV: 91.2 fL (ref 78.0–100.0)
Monocytes Absolute: 0.3 10*3/uL (ref 0.1–1.0)
Monocytes Relative: 4 %
NEUTROS ABS: 7.7 10*3/uL (ref 1.7–7.7)
NEUTROS PCT: 88 %
Platelets: 178 10*3/uL (ref 150–400)
RBC: 4.1 MIL/uL (ref 3.87–5.11)
RDW: 13.2 % (ref 11.5–15.5)
WBC: 8.7 10*3/uL (ref 4.0–10.5)

## 2015-09-22 LAB — GLUCOSE, CAPILLARY
Glucose-Capillary: 149 mg/dL — ABNORMAL HIGH (ref 65–99)
Glucose-Capillary: 132 mg/dL — ABNORMAL HIGH (ref 65–99)
Glucose-Capillary: 122 mg/dL — ABNORMAL HIGH (ref 65–99)

## 2015-09-22 LAB — BASIC METABOLIC PANEL
ANION GAP: 11 (ref 5–15)
BUN: 19 mg/dL (ref 6–20)
CALCIUM: 8.5 mg/dL — AB (ref 8.9–10.3)
CO2: 25 mmol/L (ref 22–32)
CREATININE: 1.07 mg/dL — AB (ref 0.44–1.00)
Chloride: 97 mmol/L — ABNORMAL LOW (ref 101–111)
GFR, EST AFRICAN AMERICAN: 57 mL/min — AB (ref >60)
GFR, EST NON AFRICAN AMERICAN: 49 mL/min — AB (ref >60)
Glucose, Bld: 123 mg/dL — ABNORMAL HIGH (ref 65–99)
Potassium: 3.7 mmol/L (ref 3.5–5.1)
SODIUM: 133 mmol/L — AB (ref 135–145)

## 2015-09-22 LAB — MRSA PCR SCREENING: MRSA BY PCR: NEGATIVE

## 2015-09-22 LAB — CREATININE, URINE, RANDOM: Creatinine, Urine: 105.79 mg/dL

## 2015-09-22 LAB — MAGNESIUM: MAGNESIUM: 2.1 mg/dL (ref 1.7–2.4)

## 2015-09-22 MED ORDER — HYDRALAZINE HCL 20 MG/ML IJ SOLN
10.0000 mg | Freq: Once | INTRAMUSCULAR | Status: AC
  Administered 2015-09-22: 10 mg via INTRAVENOUS
  Filled 2015-09-22: qty 1

## 2015-09-22 MED ORDER — INSULIN ASPART 100 UNIT/ML ~~LOC~~ SOLN
0.0000 [IU] | Freq: Three times a day (TID) | SUBCUTANEOUS | Status: DC
  Administered 2015-09-22 – 2015-09-23 (×3): 2 [IU] via SUBCUTANEOUS
  Administered 2015-09-23 – 2015-09-24 (×2): 3 [IU] via SUBCUTANEOUS
  Administered 2015-09-24: 5 [IU] via SUBCUTANEOUS
  Administered 2015-09-25: 3 [IU] via SUBCUTANEOUS
  Administered 2015-09-25 (×2): 2 [IU] via SUBCUTANEOUS

## 2015-09-22 MED ORDER — ENOXAPARIN SODIUM 40 MG/0.4ML ~~LOC~~ SOLN
40.0000 mg | SUBCUTANEOUS | Status: DC
  Administered 2015-09-23: 40 mg via SUBCUTANEOUS
  Filled 2015-09-22: qty 0.4

## 2015-09-22 MED ORDER — CEFTRIAXONE SODIUM 1 G IJ SOLR
1.0000 g | INTRAMUSCULAR | Status: DC
  Administered 2015-09-22 – 2015-09-23 (×2): 1 g via INTRAVENOUS
  Filled 2015-09-22 (×3): qty 10

## 2015-09-22 NOTE — Progress Notes (Signed)
Patient's BP 173/101, on-call coverage, NP. Lynch notified.  One time dose of hydralazine 10mg  ordered. Patient transported to MRA before Hydralazine could be given; will reevaluate BP upon return and administer hydralazine if BP appropriate.  Will continue to monitor patient, follow MD/NP orders, and notify as needed.

## 2015-09-22 NOTE — ED Notes (Signed)
[facility] here to transfer pt to [facility]. 

## 2015-09-22 NOTE — Progress Notes (Signed)
STROKE TEAM PROGRESS NOTE   HISTORY OF PRESENT ILLNESS (per record) Tammy Beard is an 75 y.o. female with a history of hypertension, hyperlipidemia, ulcerative colitis, polio, rheumatic fever and polymyalgia, presented with complaint of dizziness and unstable gait for about 5 days. She has not experienced any focal weakness. She describes dizziness as room spinning. She has known hearing loss which has not changed. Speech has not changed. CT scan of her head showed no acute intracranial abnormality. 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  LSN: Unclear. tPA Given: No: Unclear when last known well; no clear acute focal deficit mRankin:   SUBJECTIVE (INTERVAL HISTORY) The patient's sister is at the bedside. The patient is very lethargic although she is able to follow most commands. Exam findings out of proportion to small frontoparietal infarct. Patient also noted to have UTI and dehydration on admission - most likely contributing to her overall condition. The patient resides at [facility] and is usually independent, alert, and oriented according to her sister.   OBJECTIVE Temp:  [97.2 F (36.2 C)-99.5 F (37.5 C)] 98.3 F (36.8 C) (07/01 1516) Pulse Rate:  [43-102] 67 (07/01 1517) Cardiac Rhythm:  [-] Normal sinus rhythm;Other (Comment) (07/01 0804) Resp:  [16-27] 16 (07/01 1516) BP: (93-173)/(47-101) 144/53 mmHg (07/01 1516) SpO2:  [86 %-100 %] 99 % (07/01 1517) Weight:  [55.3 kg (121 lb 14.6 oz)] 55.3 kg (121 lb 14.6 oz) (07/01 0119)  CBC:   Recent Labs Lab 09/21/15 1554 09/22/15 0832  WBC 13.6* 8.7  NEUTROABS  --  7.7  HGB 12.4 11.8*  HCT 37.7 37.4  MCV 88.9 91.2  PLT 216 0000000    Basic Metabolic Panel:   Recent Labs Lab 09/21/15 1554 09/22/15 0832  NA 130* 133*  K 3.7 3.7  CL 94* 97*  CO2 27 25  GLUCOSE 304* 123*  BUN 35* 19  CREATININE 1.24* 1.07*  CALCIUM 8.5* 8.5*  MG  --  2.1    Lipid Panel:     Component Value  Date/Time   CHOL 180 08/23/2015   TRIG 222* 08/23/2015   TRIG 115 02/05/2006 1016   HDL 73* 08/23/2015   CHOLHDL 3 11/14/2008 1037   CHOLHDL 2.9 CALC 02/05/2006 1016   VLDL 25.2 11/14/2008 1037   LDLCALC 63 08/23/2015   HgbA1c:  Lab Results  Component Value Date   HGBA1C 6.0 08/23/2015   Urine Drug Screen: No results found for: LABOPIA, COCAINSCRNUR, LABBENZ, AMPHETMU, THCU, LABBARB    IMAGING  Dg Chest 2 View 09/21/2015   COPD.  No evidence of pneumothorax or other acute findings. Aortic atherosclerosis noted.    Ct Head Wo Contrast 09/21/2015   No acute intracranial abnormalities. Generalized atrophy. Mega cisterna magna.     Mr Brain Wo Contrast 09/21/2015   Patient was not able to complete the examination. Sequences obtained are motion degraded. Question tiny acute infarct medial aspect posterior right frontal -parietal lobe junction. Remote infarct superior vermis. No intracranial hemorrhage. Mild chronic microvascular changes. Moderate global atrophy without hydrocephalus.    Mr Jodene Nam Head/brain Wo Cm 09/22/2015   Severely limited study due to motion artifact. No large or proximal arterial branch occlusion. No definite high-grade or correctable stenosis, although evaluation is fairly limited.     PHYSICAL EXAM Frail elderly pleasant caucasian lady not in distress. . Afebrile. Head is nontraumatic. Neck is supple without bruit.    Cardiac exam no murmur or gallop. Lungs are clear to auscultation. Distal pulses are well  felt.  Neurological Exam ;  Awake  Alert oriented x 3. Normal speech and language.eye movements full without nystagmus.fundi were not visualized. Vision acuity and fields appear normal. Hearing is normal. Palatal movements are normal. Face symmetric. Tongue midline. Normal strength, tone, reflexes and coordination. Normal sensation. Gait deferred.      ASSESSMENT/PLAN Ms. Tammy Beard is a 75 y.o. female with history of hypertension,  hyperlipidemia, and previous stroke ( by MRI )  presenting with dizziness and unstable gait. She did not receive IV t-PA due to late presentation.  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  Carotid Doppler - will order  2D Echo - pending  LDL - 08/23/2015 - 63  HgbA1c pending  VTE prophylaxis - Lovenox Diet Heart Room service appropriate?: Yes; Fluid consistency:: Thin  No antithrombotic prior to admission, now on aspirin 325 mg daily  Patient counseled to be compliant with her antithrombotic medications  Ongoing aggressive stroke risk factor management  Therapy recommendations: Skilled nursing facility placement recommended  Disposition: Pending  Hypertension  Stable  Permissive hypertension (OK if < 220/120) but gradually normalize in 5-7 days  Long-term BP goal normotensive  Hyperlipidemia  Home meds: No lipid lowering medications prior to admission  LDL 63, goal < 70   Other Stroke Risk Factors  Advanced age  ETOH use, advised to drink no more than 1 to 2 drinks per day  Hx stroke/TIA   Other Active Problems  UTI - culture pending - IV Rocephin day #1 - may account for patient's symptoms.  Leukocytosis - resolved  Probable diabetes - glucose 304 on admission (on prednisone for polymyalgia rheumatica)  Dehydration - improving (BUN - 35 ; creatinine - 1.24 on admission )  Mild hypocalcemia and hyponatremia   Hospital day #    Mikey Bussing PA-C [facility] Pager [phone] 09/22/2015, 4:38 PM I have personally examined this patient, reviewed notes, independently viewed imaging studies, participated in medical decision making and plan of care. I have made any additions or  clarifications directly to the above note. Agree with note above. She presented with  5 days h/o nausea, weakness without focal deficits in setting of UTI and dehydration and MRI shows tiny likely silent frontal lacunar infarct from small vessel disease and remains at risk for neurological worsening, recurrent stroke/TIA and needs ongoing stroke evaluation and aggressive risk factor modification.Recommend treat UTI and hydration and long d/w daughter about her silent stroke and answered questions. D/w medical hospitalist.I spent a total of 25  Minutes in face to face in clinical consultation, greater than 50% of which was counseling/coordinating care about stroke prevention and treatment.    Antony Contras, MD Medical Director [facility] Pager: [phone] 09/22/2015 7:08 PM     To contact Stroke Continuity provider, please refer to [email or web address] After hours, contact General Neurology

## 2015-09-22 NOTE — Progress Notes (Signed)
PROGRESS NOTE    Tammy Beard  [id number] DOB: 08/10/1940 DOA: 09/21/2015 PCP: [name], MD   Brief Narrative:   Tammy Beard is a 75 y.o. female with medical history significant of hypertension, hyperlipidemia, GERD, ulcerative colitis, osteoporosis, heart murmur, melanoma, polio, polymyalgia, rheumatoid arthritis, poor hearing, who presents with dizziness, nausea and vomiting.  Patient has been having dizziness and poor balance for 5 days. Patient has a feeling that the room is spinning around her. She also has nausea and vomited 3 times on Tuesday, but no vomiting today. Patient does not have diarrhea or abdominal pain. She does not have unilateral weakness, numbness or tingling sensations. She has poor hearing, which is chronic issue, no vision change.   Pt states that she was answering door, when it opened and hit her on her head today. Pt sat down afterwards and later fell. No LOC, or neck/back pain. Patient does not have chest pain, shortness of breath, cough, symptoms of UTI. Per report, EMS found pt was slightly orthostatic (systolic BP dropped from [id number] to 106). EMS gave 445ml NS and zofran for nausea. Pt was given prescription dimenhydrinate by PCP. She states that she did not get this medication until today, but states that she got it from her friend and took it without help.  ED Course: pt was found to have WBC 13.6, temperature normal, transient bradycardia, AKI with cre 1.24 and BUN 35, pending UA and urine culture. CT head is negative for acute intracranial abnormalities. MRI of brain has motion degradation, but showed questionable tiny acute infarct medial aspect posterior right frontal,-parietal lobe junction; remote infarct superior vermis. Pt is placed on tele bed for obs. Neuro was consulted.   Assessment & Plan:   Principal Problem:   Stroke [facility]) Active Problems:   Dyslipidemia   Essential hypertension   Ulcerative colitis ([place])   Polymyalgia  rheumatica ([facility])   Heart murmur   Dizzy   AKI (acute kidney injury) ([place])   UTI (urinary tract infection)  #1 acute medial right frontoparietal small infarction Patient hasn't been up ambulating and a such unable to determine whether she still dizzy. Stroke workup underway including carotid Dopplers, 2-D echo. Fasting lipid panel with LDL of 63 which is at goal. PT/OT. Risk factor modification. Continue aspirin for secondary stroke prevention.  #2 probable UTI Urine cultures pending. IV Rocephin.  #3 dizziness versus vertigo Likely secondary to poor oral intake. Patient clinically dry on examination. PT/OT pending. MRI of the head with a small right frontoparietal infarction. IV fluids. Supportive care.  #4 dehydration IV fluids.  #5 acute kidney injury Likely secondary to prerenal azotemia in the setting of ACE inhibitor diuretics. Renal function improving. Diuretics and ACE inhibitor on hold. Follow.  #6 ulcerative colitis Stable. Continue mesalamine. Continue prednisone.  #7 polymyalgia rheumatica Continue steroids.  #8 hyertension BP meds on hold secondary to acute kidney injury and dehydration. Follow BP. If further control of blood pressure as needed will start patient on Norvasc.    DVT prophylaxis: Lovenox Code Status: DO NOT RESUSCITATE Family Communication: Updated patient and sister at bedside. Disposition Plan: Pending stroke workup pending PT evaluation. Likely back to independent living facility versus SNF.   Consultants:   Neurology: Dr. Nicole Kindred 09/22/2015  Procedures:   CT Head  09/21/2015 chest x-ray 09/21/2015 MRI/MRA head 09/21/2015, 09/22/2015  Antimicrobials:   IV Rocephin 09/22/2015   Subjective: Patient denies any chest. No shortness of breath. Patient denies any further nausea or emesis. Patient  states unable to tell whether she is dizzy as she has not ambulated yet.  Objective: Filed Vitals:   09/22/15 0844 09/22/15 1033 09/22/15 1516  09/22/15 1517  BP: 147/48 93/73 144/53   Pulse: 92 83 44 67  Temp: 98.7 F (37.1 C) 98.9 F (37.2 C) 98.3 F (36.8 C)   TempSrc: Oral Oral Oral   Resp: 16 17 16    Height:      Weight:      SpO2: 100% 97% 99% 99%    Intake/Output Summary (Last 24 hours) at 09/22/15 1522 Last data filed at 09/22/15 0616  Gross per 24 hour  Intake    540 ml  Output      0 ml  Net    540 ml   Filed Weights   09/22/15 0119  Weight: 55.3 kg (121 lb 14.6 oz)    Examination:  General exam: Appears calm and comfortable.Dry mucous membranes. Respiratory system: Clear to auscultation anterior lung fields. Respiratory effort normal. Cardiovascular system: 3/6 SEM. No JVD, murmurs, rubs, gallops or clicks. No pedal edema. Gastrointestinal system: Abdomen is nondistended, soft and nontender. No organomegaly or masses felt. Normal bowel sounds heard. Central nervous system: Alert and oriented. No focal neurological deficits. Extremities: Symmetric 5 x 5 power. Skin: No rashes, lesions or ulcers Psychiatry: Judgement and insight appear normal. Mood & affect appropriate.     Data Reviewed: I have personally reviewed following labs and imaging studies  CBC:  Recent Labs Lab 09/21/15 1554 09/22/15 0832  WBC 13.6* 8.7  NEUTROABS  --  7.7  HGB 12.4 11.8*  HCT 37.7 37.4  MCV 88.9 91.2  PLT 216 0000000   Basic Metabolic Panel:  Recent Labs Lab 09/21/15 1554 09/22/15 0832  NA 130* 133*  K 3.7 3.7  CL 94* 97*  CO2 27 25  GLUCOSE 304* 123*  BUN 35* 19  CREATININE 1.24* 1.07*  CALCIUM 8.5* 8.5*  MG  --  2.1   GFR: Estimated Creatinine Clearance: 34.3 mL/min (by C-G formula based on Cr of 1.07). Liver Function Tests: No results for input(s): AST, ALT, ALKPHOS, BILITOT, PROT, ALBUMIN in the last 168 hours. No results for input(s): LIPASE, AMYLASE in the last 168 hours. No results for input(s): AMMONIA in the last 168 hours. Coagulation Profile:  Recent Labs Lab 09/21/15 2131  INR 1.07     Cardiac Enzymes: No results for input(s): CKTOTAL, CKMB, CKMBINDEX, TROPONINI in the last 168 hours. BNP (last 3 results) No results for input(s): PROBNP in the last 8760 hours. HbA1C: No results for input(s): HGBA1C in the last 72 hours. CBG:  Recent Labs Lab 09/22/15 1156  GLUCAP 149*   Lipid Profile: No results for input(s): CHOL, HDL, LDLCALC, TRIG, CHOLHDL, LDLDIRECT in the last 72 hours. Thyroid Function Tests: No results for input(s): TSH, T4TOTAL, FREET4, T3FREE, THYROIDAB in the last 72 hours. Anemia Panel: No results for input(s): VITAMINB12, FOLATE, FERRITIN, TIBC, IRON, RETICCTPCT in the last 72 hours. Sepsis Labs: No results for input(s): PROCALCITON, LATICACIDVEN in the last 168 hours.  Recent Results (from the past 240 hour(s))  MRSA PCR Screening     Status: None   Collection Time: 09/22/15  1:30 AM  Result Value Ref Range Status   MRSA by PCR NEGATIVE NEGATIVE Final    Comment:        The GeneXpert MRSA Assay (FDA approved for NASAL specimens only), is one component of a comprehensive MRSA colonization surveillance program. It is not intended to diagnose MRSA  infection nor to guide or monitor treatment for MRSA infections.          Radiology Studies: Dg Chest 2 View  09/21/2015  CLINICAL DATA:  Fall. Left rib and chest pain and tenderness. Initial encounter. EXAM: CHEST  2 VIEW COMPARISON:  05/17/2014 FINDINGS: The heart size and mediastinal contours are within normal limits. Aortic atherosclerosis noted. Changes of COPD are again demonstrated. No evidence of pulmonary infiltrate or edema. No evidence of pneumothorax or pleural effusion. Both lungs are clear. The visualized skeletal structures are unremarkable. IMPRESSION: COPD.  No evidence of pneumothorax or other acute findings. Aortic atherosclerosis noted. Electronically Signed   By: Earle Gell M.D.   On: 09/21/2015 17:43   Ct Head Wo Contrast  09/21/2015  CLINICAL DATA:  Struck in head with  a door, no loss of consciousness, stepped down afterwards and later fell, dizziness for past week, history hypertension, ulcer colitis, melanoma EXAM: CT HEAD WITHOUT CONTRAST TECHNIQUE: Contiguous axial images were obtained from the base of the skull through the vertex without intravenous contrast. 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 Ventricles otherwise normal morphology. No midline shift or mass effect. Otherwise normal appearance of brain parenchyma. No intracranial hemorrhage, mass lesion or evidence acute infarction. No extra-axial fluid collections. Visualized paranasal sinuses and mastoid air cells clear. Atherosclerotic calcifications at the carotid siphons. Bones appear intact. IMPRESSION: No acute intracranial abnormalities. Generalized atrophy. Mega cisterna magna. Electronically Signed   By: Lavonia Dana M.D.   On: 09/21/2015 17:48   Mr Brain Wo Contrast  09/21/2015  CLINICAL DATA:  70 year hypertensive female hit in head by door. Sat down afterwards and fell. Dizziness for past week. Subsequent encounter. EXAM: MRI HEAD WITHOUT CONTRAST TECHNIQUE: Multiplanar, multiecho pulse sequences of the brain and surrounding structures were obtained without intravenous contrast. COMPARISON:  09/21/2015 head CT.  No comparison brain MR. FINDINGS: Patient was not able to complete the examination. Sequences obtained are motion degraded. Question tiny acute infarct medial aspect posterior right frontal -parietal lobe junction. Remote infarct superior vermis. No intracranial hemorrhage. Mild chronic microvascular changes. Moderate global atrophy without hydrocephalus. No intracranial mass lesion noted on this unenhanced exam. Mega cisterna magna incidentally noted. Major intracranial vascular structures are patent. Post lens replacement without acute orbital abnormality. Partial opacification inferior mastoid air cells bilaterally without obstructing  lesion noted. Minimal mucosal thickening ethmoid sinus air cells. IMPRESSION: Patient was not able to complete the examination. Sequences obtained are motion degraded. Question tiny acute infarct medial aspect posterior right frontal -parietal lobe junction. Remote infarct superior vermis. No intracranial hemorrhage. Mild chronic microvascular changes. Moderate global atrophy without hydrocephalus. Electronically Signed   By: Genia Del M.D.   On: 09/21/2015 19:50   Mr Jodene Nam Head/brain Wo Cm  09/22/2015  CLINICAL DATA:  Initial evaluation for the acute stroke. EXAM: MRA HEAD WITHOUT CONTRAST TECHNIQUE: Angiographic images of the Circle of Willis were obtained using MRA technique without intravenous contrast. COMPARISON:  Prior MRI from 09/21/2015. FINDINGS: ANTERIOR CIRCULATION: Study is severely limited by motion artifact. Distal cervical segments of the internal carotid arteries are patent with antegrade flow. Petrous segments grossly patent. Cavernous and supraclinoid segments patent without obvious high-grade stenosis, although evaluation is fairly limited. A1 segments are grossly patent. The left A1 segment appears to be hypoplastic. The anterior cerebral arteries are grossly patent to their mid- distal aspect. M1 segments patent without occlusion. Evaluation for focal severe stenosis is fairly limited on this exam.  MCA bifurcations not well evaluated. Distal MCA branches not well evaluated. POSTERIOR CIRCULATION: Vertebral arteries grossly patent to the vertebrobasilar junction. Posterior inferior cerebral arteries not well evaluated. Basilar patent to its distal aspect. Superior cerebral arteries are grossly patent proximally. The posterior cerebral arteries both arise in the basilar artery and are well opacified to their distal aspects. The right PCA is somewhat attenuated distally as compared to the left. No obvious aneurysm or vascular malformation. IMPRESSION: Severely limited study due to motion  artifact. No large or proximal arterial branch occlusion. No definite high-grade or correctable stenosis, although evaluation is fairly limited. Electronically Signed   By: Jeannine Boga M.D.   On: 09/22/2015 05:10        Scheduled Meds: . aspirin  300 mg Rectal Daily   Or  . aspirin  325 mg Oral Daily  . calcium-vitamin D  1 tablet Oral Daily  . cefTRIAXone (ROCEPHIN)  IV  1 g Intravenous Q24H  . enoxaparin (LOVENOX) injection  30 mg Subcutaneous Q24H  . famotidine  10 mg Oral Daily  . insulin aspart  0-15 Units Subcutaneous TID WC  . mesalamine  2.4 g Oral Q breakfast  . multivitamin with minerals  1 tablet Oral Daily  . predniSONE  10 mg Oral Daily   Continuous Infusions: . sodium chloride 100 mL/hr at 09/22/15 0222        Time spent: 47 minutes    THOMPSON,DANIEL, MD [facility] Pager [phone]  If 7PM-7AM, please contact night-coverage [email or web address] Password [email or web address] 09/22/2015, 3:22 PM

## 2015-09-22 NOTE — Progress Notes (Signed)
SLP Cancellation Note  Patient Details Name: SHAELEIGH ZEEMAN MRN: [id number] DOB: [birth year]   Cancelled treatment:       Reason Eval/Treat Not Completed: Fatigue/lethargy limiting ability to participate. Pts family member politely requested SLP return later since pt just fell asleep and had been groggy. Will attempt this pm.    Lilianah Buffin, Katherene Ponto 09/22/2015, 10:42 AM

## 2015-09-22 NOTE — Progress Notes (Signed)
Occupational Therapy Evaluation Patient Details Name: Tammy Beard MRN: [id number] DOB: [birth year] Today's Date: 09/22/2015    History of Present Illness 75 y.o. female with medical history significant of hypertension, hyperlipidemia, GERD, ulcerative colitis, osteoporosis, heart murmur, melanoma, polio, polymyalgia, rheumatoid arthritis, poor hearing, who presents with dizziness, nausea and vomiting.Patient has been having dizziness and poor balance for 5 days. Patient has a feeling that the room is spinning around her. 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    Clinical Impression   PTA, pt lived in [place] at [facility] and was independent with ADL and mobility. Family states pt has baseline cognitive deficits "like autism". Pt appears more impaired cognitively than her baseline level. HOH at baseline - family bringing in batteries for hearing aids. Pt with complaints of dizziness during evaluation with significant posterior lean. Pt unable to return demonstrate technique of gaze stabilization during mobility. Required mod A with stand pivot transfer. Will need +2 for safe ambulation. Mod A with LB ADL. Pt will benefit from rehab at SNF prior to returning to [place]. Will follow acutely to address established goals and facilitate safe D/C to SNF.     Follow Up Recommendations  SNF;Supervision/Assistance - 24 hour    Equipment Recommendations  Other (comment) (TBA at SNF)    Recommendations for Other Services       Precautions / Restrictions Precautions Precautions: Fall Restrictions Weight Bearing Restrictions: No      Mobility Bed Mobility Overal bed mobility: Needs Assistance Bed Mobility: Supine to Sit;Sit to Supine     Supine to sit: Mod assist Sit to supine: Mod assist   General bed mobility comments: Educated pt on need to maintain focus on object during mobility, but pt unable to carry  over  Transfers Overall transfer level: Needs assistance   Transfers: Sit to/from [employer] Transfers Sit to Stand: Mod assist Stand pivot transfers: Mod assist       General transfer comment: strong posterior lean    Balance Overall balance assessment: Needs assistance   Sitting balance-Leahy Scale: Poor       Standing balance-Leahy Scale: Poor                              ADL Overall ADL's : Needs assistance/impaired Eating/Feeding: Minimal assistance;Sitting Eating/Feeding Details (indicate cue type and reason): assist for set up . Poor PO intake per brother Grooming: Minimal assistance;Sitting   Upper Body Bathing: Minimal assitance;Sitting   Lower Body Bathing: Moderate assistance;Sit to/from stand   Upper Body Dressing : Minimal assistance;Sitting   Lower Body Dressing: Moderate assistance;Sit to/from stand   Toilet Transfer: Moderate assistance;BSC;Stand-pivot   Toileting- Clothing Manipulation and Hygiene: Minimal assistance;Sitting/lateral lean       Functional mobility during ADLs: Moderate assistance (bsc - bed only) General ADL Comments: Significant decline in function. Educated pt's brother onneed for pt to be upright when eating.     Vision Additional Comments: Vision ipaired at baseline. Pt states vision ins at baseline   Perception     Praxis      Pertinent Vitals/Pain Pain Assessment: Faces Faces Pain Scale: Hurts little more Pain Location: Head/stomach Pain Descriptors / Indicators: Aching;Discomfort;Grimacing;Guarding Pain Intervention(s): Limited activity within patient's tolerance     Hand Dominance Left   Extremity/Trunk Assessment Upper Extremity Assessment Upper Extremity Assessment: Generalized weakness   Lower Extremity Assessment Lower Extremity Assessment: Defer to PT evaluation  Cervical / Trunk Assessment Cervical / Trunk Assessment: Other exceptions Cervical / Trunk Exceptions: posterior  bias   Communication Communication Communication: HOH   Cognition Arousal/Alertness: Awake/alert Behavior During Therapy: Restless;Flat affect;Impulsive Overall Cognitive Status: Impaired/Different from baseline Area of Impairment: Orientation;Attention;Memory;Following commands;Safety/judgement;Awareness;Problem solving Orientation Level: Disoriented to;Time Current Attention Level: Sustained Memory: Decreased recall of precautions;Decreased short-term memory Following Commands: Follows one step commands with increased time Safety/Judgement: Decreased awareness of safety;Decreased awareness of deficits Awareness: Intellectual Problem Solving: Slow processing;Difficulty sequencing General Comments: Family states at baseline Patircia has "cognitive challenges", "something like autism", but lives independently   General Comments       Exercises       Shoulder Instructions      Home Living Family/patient expects to be discharged to:: Unsure                                        Prior Functioning/Environment Level of Independence: Independent        Comments: Pt living in [place] at [facility].     OT Diagnosis: Generalized weakness;Cognitive deficits;Disturbance of vision;Acute pain;Paresis   OT Problem List: Decreased strength;Decreased activity tolerance;Impaired balance (sitting and/or standing);Impaired vision/perception;Decreased coordination;Decreased cognition;Decreased safety awareness;Decreased knowledge of use of DME or AE;Decreased knowledge of precautions;Cardiopulmonary status limiting activity;Pain   OT Treatment/Interventions: Self-care/ADL training;Therapeutic exercise;Neuromuscular education;Energy conservation;DME and/or AE instruction;Therapeutic activities;Cognitive remediation/compensation;Visual/perceptual remediation/compensation;Patient/family education;Balance training    OT Goals(Current goals can be found in  the care plan section) Acute Rehab OT Goals Patient Stated Goal: To stop pain  OT Goal Formulation: With patient/family Time For Goal Achievement: 10/06/15 Potential to Achieve Goals: Good ADL Goals Pt Will Perform Upper Body Bathing: with set-up;with supervision;sitting Pt Will Perform Lower Body Bathing: with min guard assist;sit to/from stand Pt Will Transfer to Toilet: with min guard assist;bedside commode;stand pivot transfer Pt Will Perform Toileting - Clothing Manipulation and hygiene: with supervision;sitting/lateral leans Additional ADL Goal #1: Complete bed mobility in preparation for ADL with min vc to use target/gaze stabilization to reduce comlaints of dizziness  OT Frequency: Min 2X/week   Barriers to D/C:            Co-evaluation              End of Session Equipment Utilized During Treatment: Gait belt Nurse Communication: Mobility status  Activity Tolerance: Patient tolerated treatment well Patient left: in bed;with call bell/phone within reach;with bed alarm set;with family/visitor present   Time: [id number] OT Time Calculation (min): 33 min Charges:  OT General Charges $OT Visit: 1 Procedure OT Evaluation $OT Eval Moderate Complexity: 1 Procedure OT Treatments $Self Care/Home Management : 8-22 mins G-Codes: OT G-codes **NOT FOR INPATIENT CLASS** Functional Assessment Tool Used: clinical judgement Functional Limitation: Self care Self Care Current Status [id number]): At least 40 percent but less than 60 percent impaired, limited or restricted Self Care Goal Status [id number]): At least 1 percent but less than 20 percent impaired, limited or restricted  Morris Markham,HILLARY 09/22/2015, 1:57 PM   [facility], OTR/L  [phone] 09/22/2015

## 2015-09-22 NOTE — Progress Notes (Signed)
   09/22/15 1400  SLP G-Codes **NOT FOR INPATIENT CLASS**  Functional Assessment Tool Used clinical judgement  Functional Limitations Swallowing  Swallow Current Status [id number]) CI  Swallow Goal Status [id number]) CI  Swallow Discharge Status [id number]) CI  SLP Evaluations  $ SLP Speech Visit 1 Procedure  SLP Evaluations  $BSS Swallow 1 Procedure  $Swallowing Treatment 1 Procedure

## 2015-09-22 NOTE — Evaluation (Signed)
Clinical/Bedside Swallow Evaluation Patient Details  Name: Tammy Beard MRN: [id number] Date of Birth: 02-28-1941  Today's Date: 09/22/2015 Time: SLP Start Time (ACUTE ONLY): 56 SLP Stop Time (ACUTE ONLY): 1350 SLP Time Calculation (min) (ACUTE ONLY): 30 min  Past Medical History:  Past Medical History  Diagnosis Date  . Ulcerative colitis   . Osteopenia     s/p 7 yrs Fosamax  . Hypertension   . Hyperlipidemia   . Sleep disorder   . GERD (gastroesophageal reflux disease)   . Heart murmur   . Polio   . Colitis   . Rheumatic fever   . Polymyalgia [facility])    Past Surgical History:  Past Surgical History  Procedure Laterality Date  . Breast lumpectomy  1986    left; benign  . Neck surgery  1986    Benign growth on neck removed  . Dilation and curettage of uterus  12/1999  . Colonoscopy  2006  . Tonsillectomy    . Melanoma excision  2007    left arm  . Cataract extraction      Left eye   . Colonosopy  07/05/13    Dr. Deatra Ina   HPI:  Tammy Beard is an 75 y.o. female with a history of hypertension, hyperlipidemia, ulcerative colitis, polio, rheumatic fever and polymyalgia, presented with complaint of dizziness and unstable gait for about 5 days. She has not experienced any focal weakness. She describes dizziness as room spinning. She has known hearing loss which has not changed. Speech has not changed. CT scan of her head showed no acute intracranial abnormality. 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 Pt passed stroke swallow screen, and MD acknowledges this in her note, but also mention SLP eval. CXR shows no acute findings   Assessment / Plan / Recommendation Clinical Impression  Pt demonstrates a mild oral dysphagia due to decreased sustained attention to PO. Pt is highly distractible and talkative during meal, often partially masticating solids and letting the bolus stay in her mouth and likely pharynx while talking before initiating  swallow. When SLP brought this to pts attention she reported (with food in her mouth) that she does this all the time and it has caused her to choke in the past and her family oftern warns her about it. With max cueing pt did manage several bites without interrupting mastication with conversation. Offered basic strategies to pt and her family. Education complete. No f/u needed    Aspiration Risk  Mild aspiration risk    Diet Recommendation Regular;Thin liquid   Liquid Administration via: Cup;Straw Medication Administration: Whole meds with liquid Supervision: Patient able to self feed Compensations: Minimize environmental distractions Postural Changes: Seated upright at 90 degrees;Remain upright for at least 30 minutes after po intake    Other  Recommendations Oral Care Recommendations: Oral care BID   Follow up Recommendations  None    Frequency and Duration            Prognosis        Swallow Study   General HPI: Tammy Beard is an 75 y.o. female with a history of hypertension, hyperlipidemia, ulcerative colitis, polio, rheumatic fever and polymyalgia, presented with complaint of dizziness and unstable gait for about 5 days. She has not experienced any focal weakness. She describes dizziness as room spinning. She has known hearing loss which has not changed. Speech has not changed. CT scan of her head showed no acute intracranial abnormality. MRI showed an equivocal small  medial frontoparietal ischemic infarction. Pt passed stroke swallow screen, and MD acknowledges this in her note, but also mention SLP eval. CXR shows no acute findings Type of Study: Bedside Swallow Evaluation Previous Swallow Assessment: none Diet Prior to this Study: Regular;Thin liquids Temperature Spikes Noted: No Respiratory Status: Room air History of Recent Intubation: No Behavior/Cognition: Alert;Cooperative;Pleasant mood;Distractible Oral Cavity Assessment: Within Functional Limits Oral Care  Completed by SLP: No Oral Cavity - Dentition: Adequate natural dentition Vision: Functional for self-feeding Self-Feeding Abilities: Able to feed self;Needs set up Patient Positioning: Upright in bed Baseline Vocal Quality: Wet Volitional Cough: Strong Volitional Swallow: Able to elicit    Oral/Motor/Sensory Function Overall Oral Motor/Sensory Function: Within functional limits   Ice Chips     Thin Liquid Thin Liquid: Within functional limits Presentation: Straw;Self Fed    Nectar Thick Nectar Thick Liquid: Not tested   Honey Thick Honey Thick Liquid: Not tested   Puree Puree: Not tested   Solid   GO   Solid: Impaired Presentation: Self Fed Oral Phase Functional Implications: Prolonged oral transit;Oral residue Pharyngeal Phase Impairments: [employer] Vocal Quality       Herbie [place], MA CCC-SLP [phone]  Olis Viverette, Katherene Ponto 09/22/2015,2:28 PM

## 2015-09-22 NOTE — Evaluation (Signed)
Physical Therapy Evaluation Patient Details Name: Tammy Beard MRN: [id number] DOB: Jul 06, 1940 Today's Date: 09/22/2015   History of Present Illness  75 y.o. female with medical history significant of hypertension, hyperlipidemia, GERD, ulcerative colitis, osteoporosis, heart murmur, melanoma, polio, polymyalgia, rheumatoid arthritis, poor hearing, who presents with dizziness, nausea and vomiting.Patient has been having dizziness and poor balance for 5 days. Patient has a feeling that the room is spinning around her. 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   Clinical Impression       Vestibular Assessment                                Pt admitted with above diagnosis. Pt currently with functional limitations due to the deficits listed below (see PT Problem List). Limited assessment due to lethargy and difficulty performing vestibular testing positions. Signs/symptoms not fully consistent with vestibular etiology although I would like to assess further due to our limited evaluation today and noted brief nystagmus in a couple of canalith positions. She is unsteady and risk of falling requiring up to mod assist for balance. Pt will benefit from skilled PT to increase their independence and safety with mobility to allow discharge to the venue listed below.        09/22/15 0001  Vestibular Assessment  General Observation Lethargic, despite returning several hours later this afternoon to evaluate. Pt having difficulty fully participating in exam. S/s not fully consistent with vestibular etiology although may be masked currently due to multiple factors including lethargy, hx of cognitive deficits, and medications.  Symptom Behavior  Type of Dizziness Imbalance  Frequency of Dizziness Intermittent throughout day and night  Duration of Dizziness seconds-minutes  Aggravating Factors Comment ("walking")  Relieving Factors No known relieving factors  Occulomotor  Exam  Occulomotor Alignment Abnormal  Spontaneous Absent  Gaze-induced (Difficulty assessing due to lethargy)  Smooth Pursuits (Difficulty assessing due to lethargy)  Saccades (Difficulty assessing due to lethargy)  Comment Difficulty assessing due to lethargy  Vestibulo-Occular Reflex  VOR 1 Head Only (x 1 viewing) (Difficulty assessing due to lethargy)  VOR 2 Head and Object (x 2 viewing) (Difficulty assessing due to lethargy)  VOR to Slow Head Movement (Difficulty assessing due to lethargy)  VOR Cancellation (Difficulty assessing due to lethargy)  Positional Testing  Dix-Hallpike Dix-Hallpike Right;Dix-Hallpike Left  Sidelying Test Sidelying Right;Sidelying Left  Horizontal Canal Testing Horizontal Canal Right;Horizontal Canal Left  Dix-Hallpike Right  Dix-Hallpike Right Duration 90sec (Difficulty assessing due to neck discomfort and impulsivness)  Dix-Hallpike Right Symptoms No nystagmus (Difficulty assessing due to neck discomfort and eyes moving)  Dix-Hallpike Left  Dix-Hallpike Left Duration 90sec (Difficulty assessing due to neck discomfort and impulsivness)  Dix-Hallpike Left Symptoms No nystagmus (Difficulty assessing due to neck discomfort and impulsivness)  Sidelying Right  Sidelying Right Duration 90  Sidelying Right Symptoms (+ symptoms -nystagmus)  Sidelying Left  Sidelying Left Duration 90 sec  Sidelying Left Symptoms Upbeat, right rotatory nystagmus (- symptoms)  Horizontal Canal Right  Horizontal Canal Right Duration 90 sec  Horizontal Canal Right Symptoms Normal  Horizontal Canal Left  Horizontal Canal Left Duration 90 sec  Horizontal Canal Left Symptoms Ageotrophic     Follow Up Recommendations SNF    Equipment Recommendations   (TBD)    Recommendations for Other Services       Precautions / Restrictions Precautions Precautions: Fall Restrictions Weight Bearing Restrictions: No  Mobility  Bed Mobility Overal bed mobility: Needs  Assistance Bed Mobility: Supine to Sit;Sit to Supine;Rolling Rolling: Min assist   Supine to sit: Mod assist Sit to supine: Mod assist   General bed mobility comments: Min assist to roll, mod assist for truncal support with transistions to and from EOB.  Transfers Overall transfer level: Needs assistance Equipment used: 1 person hand held assist Transfers: Sit to/from Stand Sit to Stand: Mod assist Stand pivot transfers: Mod assist       General transfer comment: Mod assist for boost to stand with posterior lean noted. Cues for safety and awareness.  Ambulation/Gait Ambulation/Gait assistance: Mod assist Ambulation Distance (Feet): 20 Feet Assistive device: 1 person hand held assist Gait Pattern/deviations: Step-through pattern;Decreased stride length;Shuffle;Staggering left;Staggering right;Drifts right/left;Leaning posteriorly Gait velocity: decreased Gait velocity interpretation: <1.8 ft/sec, indicative of risk for recurrent falls General Gait Details: Mod assist intermittently for balance. No buckling noted. very guarded. difficulty with turns. Cues for safety and technique throughout.  Stairs            Wheelchair Mobility    Modified Rankin (Stroke Patients Only) Modified Rankin (Stroke Patients Only) Pre-Morbid Rankin Score: No symptoms Modified Rankin: Moderately severe disability     Balance Overall balance assessment: Needs assistance Sitting-balance support: No upper extremity supported Sitting balance-Leahy Scale: Fair     Standing balance support: Single extremity supported Standing balance-Leahy Scale: Poor                               Pertinent Vitals/Pain Pain Assessment: Faces Faces Pain Scale: Hurts little more Pain Location: stomach Pain Descriptors / Indicators: Aching Pain Intervention(s): Monitored during session;Repositioned    Home Living Family/patient expects to be discharged to:: Skilled nursing facility Living  Arrangements: Alone   Type of Home: Independent living facility           Additional Comments: Limited historian about onset of symptoms.    Prior Function Level of Independence: Independent         Comments: Pt living in [place] [facility] [place] Hand: Left    Extremity/Trunk Assessment   Upper Extremity Assessment: Defer to OT evaluation           Lower Extremity Assessment: Overall WFL for tasks assessed      Cervical / Trunk Assessment: Other exceptions  Communication   Communication: HOH  Cognition Arousal/Alertness: Awake/alert Behavior During Therapy: Flat affect;Impulsive Overall Cognitive Status: Impaired/Different from baseline Area of Impairment: Orientation;Attention;Memory;Following commands;Safety/judgement;Awareness;Problem solving Orientation Level: Disoriented to;Time Current Attention Level: Sustained Memory: Decreased recall of precautions;Decreased short-term memory Following Commands: Follows one step commands with increased time Safety/Judgement: Decreased awareness of safety;Decreased awareness of deficits Awareness: Intellectual Problem Solving: Slow processing;Difficulty sequencing General Comments: Family states at baseline Tammy Beard has "cognitive challenges", "something like autism", but lives independently    General Comments General comments (skin integrity, edema, etc.): See vestibular assessment.    Exercises        Assessment/Plan    PT Assessment Patient needs continued PT services  PT Diagnosis Difficulty walking;Abnormality of gait;Generalized weakness;Acute pain   PT Problem List Decreased strength;Decreased activity tolerance;Decreased balance;Decreased mobility;Decreased coordination;Decreased cognition;Decreased knowledge of use of DME;Decreased safety awareness;Decreased knowledge of precautions;Pain  PT Treatment Interventions DME instruction;Gait  training;Functional mobility training;Therapeutic activities;Therapeutic exercise;Balance training;Neuromuscular re-education;Cognitive remediation;Patient/family education   PT Goals (Current goals can be found in the Care  Plan section) Acute Rehab PT Goals Patient Stated Goal: Feel better PT Goal Formulation: With patient Time For Goal Achievement: 10/06/15 Potential to Achieve Goals: Good    Frequency Min 3X/week   Barriers to discharge Decreased caregiver support independent living    Co-evaluation               End of Session Equipment Utilized During Treatment: Gait belt Activity Tolerance: Other (comment) (Limited by nausea) Patient left: in bed;with call bell/phone within reach;with bed alarm set;with family/visitor present      Functional Assessment Tool Used: clinical observation Functional Limitation: Mobility: Walking and moving around Mobility: Walking and Moving Around Current Status [id number]): At least 20 percent but less than 40 percent impaired, limited or restricted Mobility: Walking and Moving Around Goal Status [phone]): At least 1 percent but less than 20 percent impaired, limited or restricted    Time: 1405-1430 PT Time Calculation (min) (ACUTE ONLY): 25 min   Charges:   PT Evaluation $PT Eval Moderate Complexity: 1 Procedure PT Treatments $Therapeutic Activity: 8-22 mins   PT G Codes:   PT G-Codes **NOT FOR INPATIENT CLASS** Functional Assessment Tool Used: clinical observation Functional Limitation: Mobility: Walking and moving around Mobility: Walking and Moving Around Current Status [id number]): At least 20 percent but less than 40 percent impaired, limited or restricted Mobility: Walking and Moving Around Goal Status [phone]): At least 1 percent but less than 20 percent impaired, limited or restricted    Ellouise Newer 09/22/2015, 4:49 PM Camille Bal [place], [place]

## 2015-09-22 NOTE — Progress Notes (Signed)
NURSING PROGRESS NOTE  Tammy Beard [id number] Admission Data: 09/22/2015 4:16 AM Attending Provider: Ivor Costa, MD [id number], Tammy Spare, MD Code Status: DNR  Allergies:  Review of patient's allergies indicates no known allergies. Past Medical History:   has a past medical history of Ulcerative colitis; Osteopenia; Hypertension; Hyperlipidemia; Sleep disorder; GERD (gastroesophageal reflux disease); Heart murmur; Polio; Colitis; Rheumatic fever; and Polymyalgia (Tammy Beard). Past Surgical History:   has past surgical history that includes Breast lumpectomy (1986); Neck surgery (1986); Dilation and curettage of uterus (12/1999); Colonoscopy (2006); Tonsillectomy; Melanoma excision (2007); Cataract extraction; and Colonosopy (07/05/13). Social History:   reports that she has never smoked. She has never used smokeless tobacco. She reports that she drinks alcohol. She reports that she does not use illicit drugs.  Tammy Beard is a 75 y.o. female patient admitted from ED:   Last Documented Vital Signs: Blood pressure 173/101, pulse 87, temperature 98.8 F (37.1 C), temperature source Oral, resp. rate 18, height 5\' 1"  (1.549 m), weight 55.3 kg (121 lb 14.6 oz), SpO2 86 %.  Cardiac Monitoring: Box # 05 in place. Cardiac monitor yields:normal sinus rhythm.  IV Fluids:  IV in place, occlusive dsg intact without redness, IV cath antecubital left, condition patent and no redness normal saline.   Skin: Appropriate for ethnicity with bruising to left elbow and abrasion to left forearm.   Patient orientated to room. Information packet given to patient. Admission inpatient armband information verified with patient to include name and date of birth and placed on patient arm. Side rails up x 2, fall assessment and education completed with patient. Patient able to verbalize understanding of risk associated with falls and verbalized understanding to call for assistance before getting out of bed. Call light within  reach. Patient able to voice and demonstrate understanding of unit orientation instructions.    Will continue to evaluate and treat per MD orders.  Tammy Hakim RN, BSN

## 2015-09-22 NOTE — Progress Notes (Signed)
PT Cancellation Note  Patient Details Name: Tammy Beard MRN: [id number] DOB: November 16, 1940   Cancelled Treatment:    Reason Eval/Treat Not Completed: Fatigue/lethargy limiting ability to participate Family report patient is very groggy and her lethargy is making it difficult to fully evaluate at this time. Will check back this afternoon to complete comprehensive PT evaluation.  Ellouise Newer 09/22/2015, 11:36 AM  Elayne Snare, [place]

## 2015-09-22 NOTE — Clinical Social Work Note (Signed)
Clinical Social Work Assessment  Patient Details  Name: Tammy Beard MRN: [id number] Date of Birth: 06/16/1940  Date of referral:  09/22/15               Reason for consult:  Discharge Planning, Facility Placement                Permission sought to share information with:  Facility Sport and exercise psychologist, Family Supports Permission granted to share information::  Yes, Verbal Permission Granted  Name::     Laurey Arrow and Animal nutritionist::     Relationship::     Contact Information:     Housing/Transportation Living arrangements for the past 2 months:  Charity fundraiser of Information:  Other (Comment Required) (Family) Patient Interpreter Needed:  None Criminal Activity/Legal Involvement Pertinent to Current Situation/Hospitalization:  No - Comment as needed Significant Relationships:  Siblings Lives with:  Facility Resident Do you feel safe going back to the place where you live?  Yes Need for family participation in patient care:  Yes (Comment)  Care giving concerns:  Family member approach CSW at nurses station stating that the family was told the patient would need to go to rehab. Family is interested in this.   Social Worker assessment / plan:  CSW met with patient and family at bedside to discuss rehab. CSW explained that CSW does not have any evaluations from therapies at this time, but that PT will see the patient when appropriate. CSW did explain that the patient has been admitted as observation, and explained how this impacts Medicare SNF benefit.   UPDATE: Patient has been switched to inpatient. CSW explained that as long as the patient has a 3 night MEDICALLY NECESSARY inpatient stay Medicare will assist with SNF placement. The patient and family members at bedside do want to move forward with SNF placement at [place] will followup with family regarding bed availability for [facility]  Employment status:  Retired, Disabled (Comment on whether or not  currently receiving Disability) Insurance information:  Medicare PT Recommendations:  Not assessed at this time Information / Referral to community resources:  [place]  Patient/Family's Response to care:  The patient and family are very pleasant. They are happy with the care the patient has received and for CSW's assistance.  Patient/Family's Understanding of and Emotional Response to Diagnosis, Current Treatment, and Prognosis:  The patient and family appear to have a good understanding of the reason the patient's admission and care plan. Both are agreeable to SNF placement at discharge if Medicare will cover it.   Emotional Assessment Appearance:  Appears stated age Attitude/Demeanor/Rapport:  Unable to Assess Affect (typically observed):  Unable to Assess Orientation:  Oriented to Self, Oriented to Place, Oriented to  Time, Oriented to Situation Alcohol / Substance use:  Not Applicable Psych involvement (Current and /or in the community):  No (Comment)  Discharge Needs  Concerns to be addressed:  Discharge Planning Concerns Readmission within the last 30 days:  No Current discharge risk:  Chronically ill, Physical Impairment Barriers to Discharge:  Continued Medical Work up   [place], LCSW 09/22/2015, 4:30 PM

## 2015-09-22 NOTE — Consult Note (Signed)
Admission H&P    Chief Complaint: Dizziness and unstable gait.  HPI: Tammy Beard is an 75 y.o. female with a history of hypertension, hyperlipidemia, ulcerative colitis, polio, rheumatic fever and polymyalgia, presented with complaint of dizziness and unstable gait for about 5 days. She has not experienced any focal weakness. She describes dizziness as room spinning. She has known hearing loss which has not changed. Speech has not changed. CT scan of her head showed no acute intracranial abnormality. 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  LSN: Unclear. tPA Given: No: Unclear when last known well; no clear acute focal deficit mRankin:  Past Medical History  Diagnosis Date  . Ulcerative colitis   . Osteopenia     s/p 7 yrs Fosamax  . Hypertension   . Hyperlipidemia   . Sleep disorder   . GERD (gastroesophageal reflux disease)   . Heart murmur   . Polio   . Colitis   . Rheumatic fever   . Polymyalgia [facility])     Past Surgical History  Procedure Laterality Date  . Breast lumpectomy  1986    left; benign  . Neck surgery  1986    Benign growth on neck removed  . Dilation and curettage of uterus  12/1999  . Colonoscopy  2006  . Tonsillectomy    . Melanoma excision  2007    left arm  . Cataract extraction      Left eye   . Colonosopy  07/05/13    Dr. Deatra Ina    Family History  Problem Relation Age of Onset  . Diabetes Paternal Grandmother   . Cancer Paternal Aunt     unaware of primary site  . Other Mother     Coad/ Bronchiectosis  . Coronary artery disease Neg Hx   . Colon cancer Neg Hx   . Rectal cancer Neg Hx   . Stomach cancer Neg Hx   . Pancreatic cancer Other    Social History:  reports that she has never smoked. She has never used smokeless tobacco. She reports that she drinks alcohol. She reports that she does not use illicit drugs.  Allergies: No Known Allergies  Medications Prior to Admission  Medication Sig Dispense  Refill  . acetaminophen (TYLENOL) 325 MG tablet Take 650 mg by mouth every 6 (six) hours as needed.    . benazepril-hydrochlorthiazide (LOTENSIN HCT) 20-12.5 MG tablet Take 0.5 tablets by mouth daily.    . Calcium Carbonate-Vitamin D (CALCIUM + D PO) Take 1 tablet by mouth daily.     Marland Kitchen dimenhyDRINATE (DRAMAMINE) 50 MG tablet Take 1 tablet every 6 hours as needed for nausea or dizziness 24 tablet 0  . fluticasone (FLONASE) 50 MCG/ACT nasal spray Place 1 spray into both nostrils daily as needed for allergies or rhinitis.    Marland Kitchen mesalamine (LIALDA) 1.2 g EC tablet take 2 tablets by mouth daily with BREAKFAST 60 tablet 6  . Multiple Vitamin (MULTIVITAMIN) tablet Take 1 tablet by mouth daily.      . predniSONE (DELTASONE) 5 MG tablet TWO TABLETs DAILY TO TREAT POLYMYALGIA RHEUMATICA (Patient taking differently: Take 10 mg by mouth daily. ) 180 tablet 2  . ranitidine (ZANTAC) 150 MG tablet One twice daily to reduce stomach acid (Patient taking differently: Take 150 mg by mouth 2 (two) times daily. ) 180 tablet 3    ROS: History obtained from chart review and the patient  General ROS: Fatigue Psychological ROS: negative for - behavioral disorder, hallucinations, memory  difficulties, mood swings or suicidal ideation Ophthalmic ROS: negative for - blurry vision, double vision, eye pain or loss of vision ENT ROS: negative for - epistaxis, nasal discharge, oral lesions, sore throat, tinnitus or vertigo Allergy and Immunology ROS: negative for - hives or itchy/watery eyes Hematological and Lymphatic ROS: negative for - bleeding problems, bruising or swollen lymph nodes Endocrine ROS: negative for - galactorrhea, hair pattern changes, polydipsia/polyuria or temperature intolerance Respiratory ROS: negative for - cough, hemoptysis, shortness of breath or wheezing Cardiovascular ROS: negative for - chest pain, dyspnea on exertion, edema or irregular heartbeat Gastrointestinal ROS: Nausea and  vomiting Genito-Urinary ROS: negative for - dysuria, hematuria, incontinence or urinary frequency/urgency Musculoskeletal ROS: negative for - joint swelling or muscular weakness Neurological ROS: as noted in HPI Dermatological ROS: negative for rash and skin lesion changes  Physical Examination: Blood pressure 149/73, pulse 78, temperature 99.5 F (37.5 C), temperature source Oral, resp. rate 18, height 5' 1"  (1.549 m), weight 55.3 kg (121 lb 14.6 oz), SpO2 100 %.  HEENT-  Normocephalic, no lesions, without obvious abnormality.  Normal external eye and conjunctiva.  Normal TM's bilaterally.  Normal auditory canals and external ears. Normal external nose, mucus membranes and septum.  Normal pharynx. Neck supple with no masses, nodes, nodules or enlargement. Cardiovascular - regular rate and rhythm and systolic murmur: early systolic 3/6, crescendo throughout the precordium Lungs - chest clear, no wheezing, rales, normal symmetric air entry Abdomen - soft, non-tender; bowel sounds normal; no masses,  no organomegaly Extremities - no joint deformities, effusion, or inflammation  Neurologic Examination: Mental Status: Alert, oriented, no acute distress.  Speech fluent without evidence of aphasia. Able to follow commands without difficulty. Cranial Nerves: II-Visual fields were normal. III/IV/VI-Pupils were equal and reacted normally to light. Extraocular movements were full and conjugate.    V/VII-no facial numbness and no facial weakness. VIII-moderately severe hearing loss bilaterally. X-normal speech and symmetrical palatal movement. XI: trapezius strength/neck flexion strength normal bilaterally XII-midline tongue extension with normal strength. Motor: 5/5 bilaterally with normal tone and bulk Sensory: Normal throughout. Deep Tendon Reflexes: 2+ and symmetric. Plantars: Route bilaterally Cerebellar: Normal finger-to-nose testing. Carotid auscultation: Normal  Results for orders  placed or performed during the hospital encounter of 09/21/15 (from the past 48 hour(s))  Basic metabolic panel     Status: Abnormal   Collection Time: 09/21/15  3:54 PM  Result Value Ref Range   Sodium 130 (L) 135 - 145 mmol/L   Potassium 3.7 3.5 - 5.1 mmol/L   Chloride 94 (L) 101 - 111 mmol/L   CO2 27 22 - 32 mmol/L   Glucose, Bld 304 (H) 65 - 99 mg/dL   BUN 35 (H) 6 - 20 mg/dL   Creatinine, Ser 1.24 (H) 0.44 - 1.00 mg/dL   Calcium 8.5 (L) 8.9 - 10.3 mg/dL   GFR calc non Af Amer 41 (L) >60 mL/min   GFR calc Af Amer 48 (L) >60 mL/min    Comment: (NOTE) The eGFR has been calculated using the CKD EPI equation. This calculation has not been validated in all clinical situations. eGFR's persistently <60 mL/min signify possible Chronic Kidney Disease.    Anion gap 9 5 - 15  CBC     Status: Abnormal   Collection Time: 09/21/15  3:54 PM  Result Value Ref Range   WBC 13.6 (H) 4.0 - 10.5 K/uL   RBC 4.24 3.87 - 5.11 MIL/uL   Hemoglobin 12.4 12.0 - 15.0 g/dL   HCT 37.7  36.0 - 46.0 %   MCV 88.9 78.0 - 100.0 fL   MCH 29.2 26.0 - 34.0 pg   MCHC 32.9 30.0 - 36.0 g/dL   RDW 13.1 11.5 - 15.5 %   Platelets 216 150 - 400 K/uL  Protime-INR     Status: None   Collection Time: 09/21/15  9:31 PM  Result Value Ref Range   Prothrombin Time 13.7 11.6 - 15.2 seconds   INR 1.07 0.00 - 1.49  APTT     Status: None   Collection Time: 09/21/15  9:31 PM  Result Value Ref Range   aPTT 29 24 - 37 seconds  Urinalysis, Routine w reflex microscopic     Status: Abnormal   Collection Time: 09/21/15 10:13 PM  Result Value Ref Range   Color, Urine YELLOW YELLOW   APPearance CLOUDY (A) CLEAR   Specific Gravity, Urine 1.027 1.005 - 1.030   pH 5.5 5.0 - 8.0   Glucose, UA 500 (A) NEGATIVE mg/dL   Hgb urine dipstick LARGE (A) NEGATIVE   Bilirubin Urine NEGATIVE NEGATIVE   Ketones, ur NEGATIVE NEGATIVE mg/dL   Protein, ur 100 (A) NEGATIVE mg/dL   Nitrite POSITIVE (A) NEGATIVE   Leukocytes, UA MODERATE (A)  NEGATIVE  Creatinine, urine, random     Status: None   Collection Time: 09/21/15 10:13 PM  Result Value Ref Range   Creatinine, Urine 105.79 mg/dL    Comment: Performed at [facility]  Urine microscopic-add on     Status: Abnormal   Collection Time: 09/21/15 10:13 PM  Result Value Ref Range   Squamous Epithelial / LPF 0-5 (A) NONE SEEN   WBC, UA TOO NUMEROUS TO COUNT 0 - 5 WBC/hpf   RBC / HPF 0-5 0 - 5 RBC/hpf   Bacteria, UA MANY (A) NONE SEEN   Dg Chest 2 View  09/21/2015  CLINICAL DATA:  Fall. Left rib and chest pain and tenderness. Initial encounter. EXAM: CHEST  2 VIEW COMPARISON:  05/17/2014 FINDINGS: The heart size and mediastinal contours are within normal limits. Aortic atherosclerosis noted. Changes of COPD are again demonstrated. No evidence of pulmonary infiltrate or edema. No evidence of pneumothorax or pleural effusion. Both lungs are clear. The visualized skeletal structures are unremarkable. IMPRESSION: COPD.  No evidence of pneumothorax or other acute findings. Aortic atherosclerosis noted. Electronically Signed   By: Earle Gell M.D.   On: 09/21/2015 17:43   Ct Head Wo Contrast  09/21/2015  CLINICAL DATA:  Struck in head with a door, no loss of consciousness, stepped down afterwards and later fell, dizziness for past week, history hypertension, ulcer colitis, melanoma EXAM: CT HEAD WITHOUT CONTRAST TECHNIQUE: Contiguous axial images were obtained from the base of the skull through the vertex without intravenous contrast. 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 Ventricles otherwise normal morphology. No midline shift or mass effect. Otherwise normal appearance of brain parenchyma. No intracranial hemorrhage, mass lesion or evidence acute infarction. No extra-axial fluid collections. Visualized paranasal sinuses and mastoid air cells clear. Atherosclerotic calcifications at the carotid siphons. Bones appear intact.  IMPRESSION: No acute intracranial abnormalities. Generalized atrophy. Mega cisterna magna. Electronically Signed   By: Lavonia Dana M.D.   On: 09/21/2015 17:48   Mr Brain Wo Contrast  09/21/2015  CLINICAL DATA:  40 year hypertensive female hit in head by door. Sat down afterwards and fell. Dizziness for past week. Subsequent encounter. EXAM: MRI HEAD WITHOUT CONTRAST TECHNIQUE: Multiplanar, multiecho pulse sequences  of the brain and surrounding structures were obtained without intravenous contrast. COMPARISON:  09/21/2015 head CT.  No comparison brain MR. FINDINGS: Patient was not able to complete the examination. Sequences obtained are motion degraded. Question tiny acute infarct medial aspect posterior right frontal -parietal lobe junction. Remote infarct superior vermis. No intracranial hemorrhage. Mild chronic microvascular changes. Moderate global atrophy without hydrocephalus. No intracranial mass lesion noted on this unenhanced exam. Mega cisterna magna incidentally noted. Major intracranial vascular structures are patent. Post lens replacement without acute orbital abnormality. Partial opacification inferior mastoid air cells bilaterally without obstructing lesion noted. Minimal mucosal thickening ethmoid sinus air cells. IMPRESSION: Patient was not able to complete the examination. Sequences obtained are motion degraded. Question tiny acute infarct medial aspect posterior right frontal -parietal lobe junction. Remote infarct superior vermis. No intracranial hemorrhage. Mild chronic microvascular changes. Moderate global atrophy without hydrocephalus. Electronically Signed   By: Genia Del M.D.   On: 09/21/2015 19:50    Assessment: 75 y.o. female with multiple risk factors for stroke presenting with complaint of vertigo as well as an equivocal medial right frontoparietal small infarction.  Stroke Risk Factors - hyperlipidemia and hypertension  Plan: 1. HgbA1c, fasting lipid panel 2.  MRA  of the brain without contrast 3. PT consult, OT consult, Speech consult 4. Echocardiogram 5. Carotid dopplers 6. Prophylactic therapy-Antiplatelet med: Aspirin  7. Risk factor modification 8. Telemetry monitoring  C.R. Nicole Kindred, MD [facility] [phone]  09/22/2015, 2:40 AM

## 2015-09-23 ENCOUNTER — Inpatient Hospital Stay (HOSPITAL_COMMUNITY): Payer: Medicare Other

## 2015-09-23 DIAGNOSIS — I63039 Cerebral infarction due to thrombosis of unspecified carotid artery: Secondary | ICD-10-CM

## 2015-09-23 DIAGNOSIS — I6789 Other cerebrovascular disease: Secondary | ICD-10-CM

## 2015-09-23 LAB — BASIC METABOLIC PANEL
Anion gap: 6 (ref 5–15)
BUN: 15 mg/dL (ref 6–20)
CHLORIDE: 102 mmol/L (ref 101–111)
CO2: 27 mmol/L (ref 22–32)
Calcium: 8.3 mg/dL — ABNORMAL LOW (ref 8.9–10.3)
Creatinine, Ser: 1.05 mg/dL — ABNORMAL HIGH (ref 0.44–1.00)
GFR, EST AFRICAN AMERICAN: 59 mL/min — AB (ref >60)
GFR, EST NON AFRICAN AMERICAN: 51 mL/min — AB (ref >60)
Glucose, Bld: 91 mg/dL (ref 65–99)
POTASSIUM: 3.5 mmol/L (ref 3.5–5.1)
SODIUM: 135 mmol/L (ref 135–145)

## 2015-09-23 LAB — GLUCOSE, CAPILLARY
GLUCOSE-CAPILLARY: 98 mg/dL (ref 65–99)
GLUCOSE-CAPILLARY: 167 mg/dL — AB (ref 65–99)
Glucose-Capillary: 124 mg/dL — ABNORMAL HIGH (ref 65–99)
Glucose-Capillary: 143 mg/dL — ABNORMAL HIGH (ref 65–99)

## 2015-09-23 LAB — UREA NITROGEN, URINE: Urea Nitrogen, Ur: 1159 mg/dL

## 2015-09-23 MED ORDER — POTASSIUM CHLORIDE CRYS ER 20 MEQ PO TBCR
40.0000 meq | EXTENDED_RELEASE_TABLET | Freq: Once | ORAL | Status: AC
  Administered 2015-09-23: 40 meq via ORAL
  Filled 2015-09-23: qty 2

## 2015-09-23 MED ORDER — PERFLUTREN LIPID MICROSPHERE
1.0000 mL | INTRAVENOUS | Status: AC | PRN
  Administered 2015-09-23: 2 mL via INTRAVENOUS
  Filled 2015-09-23: qty 10

## 2015-09-23 NOTE — Progress Notes (Signed)
STROKE TEAM PROGRESS NOTE   HISTORY OF PRESENT ILLNESS (per record) Tammy Beard is an 75 y.o. female with a history of hypertension, hyperlipidemia, ulcerative colitis, polio, rheumatic fever and polymyalgia, presented with complaint of dizziness and unstable gait for about 5 days. She has not experienced any focal weakness. She describes dizziness as room spinning. She has known hearing loss which has not changed. Speech has not changed. CT scan of her head showed no acute intracranial abnormality. 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  LSN: Unclear. tPA Given: No: Unclear when last known well; no clear acute focal deficit mRankin:   SUBJECTIVE (INTERVAL HISTORY) The patient's sister is at the bedside. The patient is more alert and interactive and feeling better but not back to baseline yet.  OBJECTIVE Temp:  [97.8 F (36.6 C)-99.5 F (37.5 C)] 97.8 F (36.6 C) (07/02 0516) Pulse Rate:  [44-76] 76 (07/02 0516) Cardiac Rhythm:  [-] Sinus tachycardia;Supraventricular tachycardia (07/01 1900) Resp:  [16] 16 (07/02 0516) BP: (143-159)/(53-66) 154/65 mmHg (07/02 0516) SpO2:  [96 %-99 %] 97 % (07/02 0516)  CBC:   Recent Labs Lab 09/21/15 1554 09/22/15 0832  WBC 13.6* 8.7  NEUTROABS  --  7.7  HGB 12.4 11.8*  HCT 37.7 37.4  MCV 88.9 91.2  PLT 216 0000000    Basic Metabolic Panel:   Recent Labs Lab 09/22/15 0832 09/23/15 0412  NA 133* 135  K 3.7 3.5  CL 97* 102  CO2 25 27  GLUCOSE 123* 91  BUN 19 15  CREATININE 1.07* 1.05*  CALCIUM 8.5* 8.3*  MG 2.1  --     Lipid Panel:     Component Value Date/Time   CHOL 180 08/23/2015   TRIG 222* 08/23/2015   TRIG 115 02/05/2006 1016   HDL 73* 08/23/2015   CHOLHDL 3 11/14/2008 1037   CHOLHDL 2.9 CALC 02/05/2006 1016   VLDL 25.2 11/14/2008 1037   LDLCALC 63 08/23/2015   HgbA1c:  Lab Results  Component Value Date   HGBA1C 6.0 08/23/2015   Urine Drug Screen: No results found for:  LABOPIA, COCAINSCRNUR, LABBENZ, AMPHETMU, THCU, LABBARB    IMAGING  Dg Chest 2 View 09/21/2015   COPD.  No evidence of pneumothorax or other acute findings. Aortic atherosclerosis noted.    Ct Head Wo Contrast 09/21/2015   No acute intracranial abnormalities. Generalized atrophy. Mega cisterna magna.     Mr Brain Wo Contrast 09/21/2015   Patient was not able to complete the examination. Sequences obtained are motion degraded. Question tiny acute infarct medial aspect posterior right frontal -parietal lobe junction. Remote infarct superior vermis. No intracranial hemorrhage. Mild chronic microvascular changes. Moderate global atrophy without hydrocephalus.    Mr Jodene Nam Head/brain Wo Cm 09/22/2015   Severely limited study due to motion artifact. No large or proximal arterial branch occlusion. No definite high-grade or correctable stenosis, although evaluation is fairly limited.     PHYSICAL EXAM Frail elderly pleasant caucasian lady not in distress. . Afebrile. Head is nontraumatic. Neck is supple without bruit.    Cardiac exam no murmur or gallop. Lungs are clear to auscultation. Distal pulses are well felt.  Neurological Exam ;  Awake  Alert oriented x 3. Normal speech and language.eye movements full without nystagmus.fundi were not visualized. Vision acuity and fields appear normal. Hearing is normal. Palatal movements are normal. Face symmetric. Tongue midline. Normal strength, tone, reflexes and coordination. Normal sensation. Gait deferred.      ASSESSMENT/PLAN [name]  Tammy Beard is a 75 y.o. female with history of hypertension, hyperlipidemia, and previous stroke ( by MRI )  presenting with dizziness and unstable gait. She did not receive IV t-PA due to late presentation.  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  Carotid Doppler - will order  2D Echo - pending  LDL - 08/23/2015 - 63  HgbA1c pending  VTE prophylaxis - Lovenox Diet Heart Room service appropriate?: Yes; Fluid consistency:: Thin  No antithrombotic prior to admission, now on aspirin 325 mg daily  Patient counseled to be compliant with her antithrombotic medications  Ongoing aggressive stroke risk factor management  Therapy recommendations: Skilled nursing facility placement recommended  Disposition: Pending  Hypertension  Stable  Permissive hypertension (OK if < 220/120) but gradually normalize in 5-7 days  Long-term BP goal normotensive  Hyperlipidemia  Home meds: No lipid lowering medications prior to admission  LDL 63, goal < 70   Other Stroke Risk Factors  Advanced age  ETOH use, advised to drink no more than 1 to 2 drinks per day  Hx stroke/TIA   Other Active Problems  UTI - culture pending - IV Rocephin day #1 - may account for patient's symptoms.  Leukocytosis - resolved  Probable diabetes - glucose 304 on admission (on prednisone for polymyalgia rheumatica)  Dehydration - improving (BUN - 35 ; creatinine - 1.24 on admission )  Mild hypocalcemia and hyponatremia   Hospital day #      I have personally examined this patient, reviewed notes, independently viewed imaging studies, participated in medical decision making and plan of care. I have made any additions or clarifications directly to the above note.   . She presented with  5 days h/o nausea, weakness without focal deficits in setting of UTI and dehydration and MRI shows tiny likely silent frontal lacunar infarct from small vessel disease and  needs ongoing stroke evaluation and aggressive risk factor modification.Recommend treat UTI and hydration and long d/w daughter about her silent stroke and answered questions. D/w medical hospitalist.I  spent a total of 15  Minutes in face to face in clinical consultation, greater than 50% of which was counseling/coordinating care about stroke prevention and treatment.    Antony Contras, MD Medical Director [facility] Pager: [phone] 09/23/2015 12:25 PM     To contact Stroke Continuity provider, please refer to [email or web address] After hours, contact General Neurology

## 2015-09-23 NOTE — Progress Notes (Signed)
  Echocardiogram 2D Echocardiogram with Definity has been performed.  Tammy Beard M 09/23/2015, 3:56 PM

## 2015-09-23 NOTE — Progress Notes (Signed)
PROGRESS NOTE    JACEY BUDDENHAGEN  [id number] DOB: 03-18-1941 DOA: 09/21/2015 PCP: [name], MD   Brief Narrative:   STEPHAN MCKERCHER is a 75 y.o. female with medical history significant of hypertension, hyperlipidemia, GERD, ulcerative colitis, osteoporosis, heart murmur, melanoma, polio, polymyalgia, rheumatoid arthritis, poor hearing, who presents with dizziness, nausea and vomiting.  Patient has been having dizziness and poor balance for 5 days. Patient has a feeling that the room is spinning around her. She also has nausea and vomited 3 times on Tuesday, but no vomiting today. Patient does not have diarrhea or abdominal pain. She does not have unilateral weakness, numbness or tingling sensations. She has poor hearing, which is chronic issue, no vision change.   Pt states that she was answering door, when it opened and hit her on her head today. Pt sat down afterwards and later fell. No LOC, or neck/back pain. Patient does not have chest pain, shortness of breath, cough, symptoms of UTI. Per report, EMS found pt was slightly orthostatic (systolic BP dropped from [id number] to 106). EMS gave 421ml NS and zofran for nausea. Pt was given prescription dimenhydrinate by PCP. She states that she did not get this medication until today, but states that she got it from her friend and took it without help.  ED Course: pt was found to have WBC 13.6, temperature normal, transient bradycardia, AKI with cre 1.24 and BUN 35, pending UA and urine culture. CT head is negative for acute intracranial abnormalities. MRI of brain has motion degradation, but showed questionable tiny acute infarct medial aspect posterior right frontal,-parietal lobe junction; remote infarct superior vermis. Pt is placed on tele bed for obs. Neuro was consulted.   Assessment & Plan:   Principal Problem:   Stroke [facility]) Active Problems:   Dyslipidemia   Essential hypertension   Ulcerative colitis ([place])   Polymyalgia  rheumatica (HCC)   Heart murmur   Dizzy   AKI (acute kidney injury) ([place])   UTI (urinary tract infection)   Acute CVA (cerebrovascular accident) ([place])  #1 acute medial right frontoparietal small infarction Patient hasn't been up ambulating and a such unable to determine whether she still dizzy. Stroke workup underway including carotid Dopplers, 2-D echo pending. Fasting lipid panel with LDL of 63 which is at goal. PT/OT. Risk factor modification. Continue aspirin for secondary stroke prevention. Neurology following.  #2 probable UTI Urine cultures pending. IV Rocephin.  #3 dizziness versus vertigo Likely secondary to poor oral intake. Patient clinically dry on examination. PT/OT pending. MRI of the head with a small right frontoparietal infarction. Stroke workup underway. IV fluids. Supportive care.  #4 dehydration IV fluids.  #5 acute kidney injury Likely secondary to prerenal azotemia in the setting of ACE inhibitor diuretics. Renal function improving. Diuretics and ACE inhibitor on hold. Follow.  #6 ulcerative colitis Stable. Continue mesalamine. Continue prednisone.  #7 polymyalgia rheumatica Continue steroids.  #8 hyertension BP meds on hold secondary to acute kidney injury and dehydration. Follow BP. If further control of blood pressure as needed will start patient on Norvasc.    DVT prophylaxis: Lovenox Code Status: DO NOT RESUSCITATE Family Communication: Updated patient and sister at bedside. Disposition Plan: Pending stroke workup pending PT evaluation. Likely back to independent living facility versus SNF.   Consultants:   Neurology: Dr. Nicole Kindred 09/22/2015  Procedures:   CT Head  09/21/2015 chest x-ray 09/21/2015 MRI/MRA head 09/21/2015, 09/22/2015 2-D echo 09/23/2015 pending  Antimicrobials:   IV Rocephin 09/22/2015  Subjective: Patient denies any chest. No shortness of breath. Patient denies any further nausea or emesis. Patient states dizziness  improved. Tolerating current diet.  Objective: Filed Vitals:   09/22/15 1517 09/22/15 2147 09/23/15 0227 09/23/15 0516  BP:  143/66 159/63 154/65  Pulse: 67 51 66 76  Temp:  99.5 F (37.5 C) 97.9 F (36.6 C) 97.8 F (36.6 C)  TempSrc:  Oral Oral Oral  Resp:  16 16 16   Height:      Weight:      SpO2: 99% 96% 96% 97%    Intake/Output Summary (Last 24 hours) at 09/23/15 1225 Last data filed at 09/23/15 1140  Gross per 24 hour  Intake   1325 ml  Output    700 ml  Net    625 ml   Filed Weights   09/22/15 0119  Weight: 55.3 kg (121 lb 14.6 oz)    Examination:  General exam: Appears calm and comfortable. Respiratory system: Clear to auscultation anterior lung fields. Respiratory effort normal. Cardiovascular system: 3/6 SEM. No JVD, murmurs, rubs, gallops or clicks. No pedal edema. Gastrointestinal system: Abdomen is nondistended, soft and nontender. No organomegaly or masses felt. Normal bowel sounds heard. Central nervous system: Alert and oriented. No focal neurological deficits. Extremities: Symmetric 5 x 5 power. Skin: No rashes, lesions or ulcers Psychiatry: Judgement and insight appear normal. Mood & affect appropriate.     Data Reviewed: I have personally reviewed following labs and imaging studies  CBC:  Recent Labs Lab 09/21/15 1554 09/22/15 0832  WBC 13.6* 8.7  NEUTROABS  --  7.7  HGB 12.4 11.8*  HCT 37.7 37.4  MCV 88.9 91.2  PLT 216 0000000   Basic Metabolic Panel:  Recent Labs Lab 09/21/15 1554 09/22/15 0832 09/23/15 0412  NA 130* 133* 135  K 3.7 3.7 3.5  CL 94* 97* 102  CO2 27 25 27   GLUCOSE 304* 123* 91  BUN 35* 19 15  CREATININE 1.24* 1.07* 1.05*  CALCIUM 8.5* 8.5* 8.3*  MG  --  2.1  --    GFR: Estimated Creatinine Clearance: 34.9 mL/min (by C-G formula based on Cr of 1.05). Liver Function Tests: No results for input(s): AST, ALT, ALKPHOS, BILITOT, PROT, ALBUMIN in the last 168 hours. No results for input(s): LIPASE, AMYLASE in the  last 168 hours. No results for input(s): AMMONIA in the last 168 hours. Coagulation Profile:  Recent Labs Lab 09/21/15 2131  INR 1.07   Cardiac Enzymes: No results for input(s): CKTOTAL, CKMB, CKMBINDEX, TROPONINI in the last 168 hours. BNP (last 3 results) No results for input(s): PROBNP in the last 8760 hours. HbA1C: No results for input(s): HGBA1C in the last 72 hours. CBG:  Recent Labs Lab 09/22/15 1156 09/22/15 1718 09/22/15 2148 09/23/15 0804 09/23/15 1209  GLUCAP 149* 132* 122* 98 167*   Lipid Profile: No results for input(s): CHOL, HDL, LDLCALC, TRIG, CHOLHDL, LDLDIRECT in the last 72 hours. Thyroid Function Tests: No results for input(s): TSH, T4TOTAL, FREET4, T3FREE, THYROIDAB in the last 72 hours. Anemia Panel: No results for input(s): VITAMINB12, FOLATE, FERRITIN, TIBC, IRON, RETICCTPCT in the last 72 hours. Sepsis Labs: No results for input(s): PROCALCITON, LATICACIDVEN in the last 168 hours.  Recent Results (from the past 240 hour(s))  MRSA PCR Screening     Status: None   Collection Time: 09/22/15  1:30 AM  Result Value Ref Range Status   MRSA by PCR NEGATIVE NEGATIVE Final    Comment:  The GeneXpert MRSA Assay (FDA approved for NASAL specimens only), is one component of a comprehensive MRSA colonization surveillance program. It is not intended to diagnose MRSA infection nor to guide or monitor treatment for MRSA infections.          Radiology Studies: Dg Chest 2 View  09/21/2015  CLINICAL DATA:  Fall. Left rib and chest pain and tenderness. Initial encounter. EXAM: CHEST  2 VIEW COMPARISON:  05/17/2014 FINDINGS: The heart size and mediastinal contours are within normal limits. Aortic atherosclerosis noted. Changes of COPD are again demonstrated. No evidence of pulmonary infiltrate or edema. No evidence of pneumothorax or pleural effusion. Both lungs are clear. The visualized skeletal structures are unremarkable. IMPRESSION: COPD.  No  evidence of pneumothorax or other acute findings. Aortic atherosclerosis noted. Electronically Signed   By: Earle Gell M.D.   On: 09/21/2015 17:43   Ct Head Wo Contrast  09/21/2015  CLINICAL DATA:  Struck in head with a door, no loss of consciousness, stepped down afterwards and later fell, dizziness for past week, history hypertension, ulcer colitis, melanoma EXAM: CT HEAD WITHOUT CONTRAST TECHNIQUE: Contiguous axial images were obtained from the base of the skull through the vertex without intravenous contrast. 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 Ventricles otherwise normal morphology. No midline shift or mass effect. Otherwise normal appearance of brain parenchyma. No intracranial hemorrhage, mass lesion or evidence acute infarction. No extra-axial fluid collections. Visualized paranasal sinuses and mastoid air cells clear. Atherosclerotic calcifications at the carotid siphons. Bones appear intact. IMPRESSION: No acute intracranial abnormalities. Generalized atrophy. Mega cisterna magna. Electronically Signed   By: Lavonia Dana M.D.   On: 09/21/2015 17:48   Mr Brain Wo Contrast  09/21/2015  CLINICAL DATA:  40 year hypertensive female hit in head by door. Sat down afterwards and fell. Dizziness for past week. Subsequent encounter. EXAM: MRI HEAD WITHOUT CONTRAST TECHNIQUE: Multiplanar, multiecho pulse sequences of the brain and surrounding structures were obtained without intravenous contrast. COMPARISON:  09/21/2015 head CT.  No comparison brain MR. FINDINGS: Patient was not able to complete the examination. Sequences obtained are motion degraded. Question tiny acute infarct medial aspect posterior right frontal -parietal lobe junction. Remote infarct superior vermis. No intracranial hemorrhage. Mild chronic microvascular changes. Moderate global atrophy without hydrocephalus. No intracranial mass lesion noted on this unenhanced exam. Mega  cisterna magna incidentally noted. Major intracranial vascular structures are patent. Post lens replacement without acute orbital abnormality. Partial opacification inferior mastoid air cells bilaterally without obstructing lesion noted. Minimal mucosal thickening ethmoid sinus air cells. IMPRESSION: Patient was not able to complete the examination. Sequences obtained are motion degraded. Question tiny acute infarct medial aspect posterior right frontal -parietal lobe junction. Remote infarct superior vermis. No intracranial hemorrhage. Mild chronic microvascular changes. Moderate global atrophy without hydrocephalus. Electronically Signed   By: Genia Del M.D.   On: 09/21/2015 19:50   Mr Jodene Nam Head/brain Wo Cm  09/22/2015  CLINICAL DATA:  Initial evaluation for the acute stroke. EXAM: MRA HEAD WITHOUT CONTRAST TECHNIQUE: Angiographic images of the Circle of Willis were obtained using MRA technique without intravenous contrast. COMPARISON:  Prior MRI from 09/21/2015. FINDINGS: ANTERIOR CIRCULATION: Study is severely limited by motion artifact. Distal cervical segments of the internal carotid arteries are patent with antegrade flow. Petrous segments grossly patent. Cavernous and supraclinoid segments patent without obvious high-grade stenosis, although evaluation is fairly limited. A1 segments are grossly patent. The left A1 segment appears to be hypoplastic. The  anterior cerebral arteries are grossly patent to their mid- distal aspect. M1 segments patent without occlusion. Evaluation for focal severe stenosis is fairly limited on this exam. MCA bifurcations not well evaluated. Distal MCA branches not well evaluated. POSTERIOR CIRCULATION: Vertebral arteries grossly patent to the vertebrobasilar junction. Posterior inferior cerebral arteries not well evaluated. Basilar patent to its distal aspect. Superior cerebral arteries are grossly patent proximally. The posterior cerebral arteries both arise in the basilar  artery and are well opacified to their distal aspects. The right PCA is somewhat attenuated distally as compared to the left. No obvious aneurysm or vascular malformation. IMPRESSION: Severely limited study due to motion artifact. No large or proximal arterial branch occlusion. No definite high-grade or correctable stenosis, although evaluation is fairly limited. Electronically Signed   By: Jeannine Boga M.D.   On: 09/22/2015 05:10        Scheduled Meds: . aspirin  300 mg Rectal Daily   Or  . aspirin  325 mg Oral Daily  . calcium-vitamin D  1 tablet Oral Daily  . cefTRIAXone (ROCEPHIN)  IV  1 g Intravenous Q24H  . enoxaparin (LOVENOX) injection  40 mg Subcutaneous Q24H  . famotidine  10 mg Oral Daily  . insulin aspart  0-15 Units Subcutaneous TID WC  . mesalamine  2.4 g Oral Q breakfast  . multivitamin with minerals  1 tablet Oral Daily  . predniSONE  10 mg Oral Daily   Continuous Infusions: . sodium chloride 75 mL/hr at 09/23/15 0817     LOS: 1 day    Time spent: 12 minutes    Laysa Kimmey, MD [facility] Pager [phone]  If 7PM-7AM, please contact night-coverage [email or web address] Password [facility] 09/23/2015, 12:25 PM

## 2015-09-23 NOTE — NC FL2 (Signed)
[place] LEVEL OF CARE SCREENING TOOL     IDENTIFICATION  Patient Name: Tammy Beard Birthdate: 09/17/1940 Sex: female Admission Date (Current Location): 09/21/2015  [facility] and [place] Number:  Herbalist and Address:  [facility], [place] [address], [place], [place] [address]      Provider Number: [id number]  Attending Physician Name and Address:  Eugenie Filler, MD  Relative Name and Phone Number:       Current Level of Care: Hospital Recommended Level of Care: [place] Prior Approval Number:    Date Approved/Denied:   PASRR Number: [id number]  Discharge Plan: SNF    Current Diagnoses: Patient Active Problem List   Diagnosis Date Noted  . UTI (urinary tract infection) 09/22/2015  . Acute CVA (cerebrovascular accident) ([place]) 09/22/2015  . Stroke ([place]) 09/21/2015  . AKI (acute kidney injury) ([place]) 09/21/2015  . Cerebrovascular accident (CVA) ([place])   . Disequilibrium   . Dizzy 09/18/2015  . 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 Diastasis recti 01/23/2015  . Pre-diabetes 03/07/2014  . Heart murmur 03/07/2014  . Polymyalgia rheumatica ([place]) 12/08/2013  . SLEEP DISORDER 06/28/2009  . URINARY FREQUENCY 06/28/2009  . Dyslipidemia 11/09/2008  . Essential hypertension 06/18/2007  . ALLERGIC RHINITIS 06/18/2007  . HOARSENESS 06/18/2007  . Ulcerative colitis ([place]) 06/08/2007  . OSTEOPENIA 06/08/2007  . MURMUR 06/08/2007    Orientation RESPIRATION BLADDER Height & Weight     Self, Time, Situation, Place  Normal Continent Weight: 121 lb 14.6 oz (55.3 kg) Height:  5\' 1"  (154.9 cm)  BEHAVIORAL SYMPTOMS/MOOD NEUROLOGICAL BOWEL NUTRITION STATUS   (none)  (None) Continent Diet (Heart)  AMBULATORY STATUS COMMUNICATION OF NEEDS Skin   Extensive Assist Verbally Normal                       Personal Care Assistance Level of Assistance  Bathing, Feeding, Dressing Bathing Assistance: Limited  assistance Feeding assistance: Independent Dressing Assistance: Limited assistance     Functional Limitations Info  Sight, Hearing, Speech Sight Info: Adequate Hearing Info: Impaired Speech Info: Adequate    SPECIAL CARE FACTORS FREQUENCY  PT (By licensed PT), OT (By licensed OT)     PT Frequency: 5/ week OT Frequency: 5/ week            Contractures Contractures Info: Not present    Additional Factors Info  Insulin Sliding Scale (cefTRIAXone (ROCEPHIN) 1 g in dextrose 5 % 50 mL IVPB Dose: 1 g Freq: Every 24 hours Route: IV) Code Status Info: DNR     Insulin Sliding Scale Info: insulin aspart (novoLOG) injection 0-15 Units Dose: 0-15 Units Freq: 3 times daily with meals Route: [place]       Current Medications (09/23/2015):  This is the current hospital active medication list Current Facility-Administered Medications  Medication Dose Route Frequency Provider Last Rate Last Dose  . 0.9 %  sodium chloride infusion   Intravenous Continuous Eugenie Filler, MD 75 mL/hr at 09/23/15 1359    . acetaminophen (TYLENOL) tablet 650 mg  650 mg Oral Q6H PRN Ivor Costa, MD      . aspirin suppository 300 mg  300 mg Rectal Daily Ivor Costa, MD       Or  . aspirin tablet 325 mg  325 mg Oral Daily Ivor Costa, MD   325 mg at 09/23/15 0859  . calcium-vitamin D (OSCAL WITH D) 500-200 MG-UNIT per tablet 1 tablet  1 tablet  Oral Daily Ivor Costa, MD   1 tablet at 09/23/15 [phone]  . cefTRIAXone (ROCEPHIN) 1 g in dextrose 5 % 50 mL IVPB  1 g Intravenous Q24H Eugenie Filler, MD   1 g at 09/23/15 0900  . enoxaparin (LOVENOX) injection 40 mg  40 mg Subcutaneous Q24H Eugenie Filler, MD   40 mg at 09/23/15 0900  . famotidine (PEPCID) tablet 10 mg  10 mg Oral Daily Ivor Costa, MD   10 mg at 09/23/15 0859  . fluticasone (FLONASE) 50 MCG/ACT nasal spray 1 spray  1 spray Each Nare Daily PRN Ivor Costa, MD      . insulin aspart (novoLOG) injection 0-15 Units  0-15 Units Subcutaneous TID WC Eugenie Filler, MD    3 Units at 09/23/15 1231  . mesalamine (LIALDA) EC tablet 2.4 g  2.4 g Oral Q breakfast Ivor Costa, MD   2.4 g at 09/23/15 0856  . multivitamin with minerals tablet 1 tablet  1 tablet Oral Daily Ivor Costa, MD   1 tablet at 09/23/15 0859  . ondansetron (ZOFRAN) injection 4 mg  4 mg Intravenous Q8H PRN Ivor Costa, MD   4 mg at 09/23/15 0900  . perflutren lipid microspheres (DEFINITY) IV suspension  1-10 mL Intravenous PRN Ivor Costa, MD   2 mL at 09/23/15 1523  . predniSONE (DELTASONE) tablet 10 mg  10 mg Oral Daily Ivor Costa, MD   10 mg at 09/23/15 0859  . senna-docusate (Senokot-S) tablet 1 tablet  1 tablet Oral QHS PRN Ivor Costa, MD         Discharge Medications: Please see discharge summary for a list of discharge medications.  Relevant Imaging Results:  Relevant Lab Results:   Additional Information [phone]  Samule Dry, LCSW

## 2015-09-23 NOTE — Progress Notes (Signed)
Physical Therapy Treatment Patient Details Name: Tammy Beard MRN: [id number] DOB: 1940-05-09 Today's Date: 09/23/2015    History of Present Illness 75 y.o. female with medical history significant of hypertension, hyperlipidemia, GERD, ulcerative colitis, osteoporosis, heart murmur, melanoma, polio, polymyalgia, rheumatoid arthritis, poor hearing, who presents with dizziness, nausea and vomiting.Patient has been having dizziness and poor balance for 5 days. Patient has a feeling that the room is spinning around her. 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     PT Comments    Patient presents with no c/o vertigo this session.  No noted symptoms with testing for BPPV either.  Patient with improved cognition and participation over last session as well.  Did relate that she feels she is having some visual difficulties (possibly hallucinations) since she is seeing parts of the wallpaper on people.  Continue to feel she needs SNF rehab initially upon return to facility prior to return to living independently.  Patient and brother educated on potential causes of vertigo and likely some of her symptoms due to fall hitting head and dehydration.  Will follow during acute stay.   Follow Up Recommendations  SNF     Equipment Recommendations  None recommended by PT    Recommendations for Other Services       Precautions / Restrictions Precautions Precautions: Fall    Mobility  Bed Mobility Overal bed mobility: Needs Assistance Bed Mobility: Supine to Sit       Sit to supine: Min assist   General bed mobility comments: for positional testing  Transfers Overall transfer level: Needs assistance Equipment used: Rolling walker (2 wheeled) Transfers: Sit to/from Stand Sit to Stand: Min assist Stand pivot transfers: Min assist       General transfer comment: up to [facility] no walker with support for balance, up from bed to RW steadying  assist  Ambulation/Gait Ambulation/Gait assistance: Min assist Ambulation Distance (Feet): 225 Feet Assistive device: Rolling walker (2 wheeled) Gait Pattern/deviations: Step-to pattern;Decreased stride length;Trunk flexed;Wide base of support     General Gait Details: cues for posture, for proximity to walker   Stairs            Wheelchair Mobility    Modified Rankin (Stroke Patients Only) Modified Rankin (Stroke Patients Only) Pre-Morbid Rankin Score: No significant disability Modified Rankin: Moderately severe disability     Balance Overall balance assessment: Needs assistance         Standing balance support: Bilateral upper extremity supported Standing balance-Leahy Scale: Poor Standing balance comment: UE support or assist for balance                    Cognition Arousal/Alertness: Awake/alert Behavior During Therapy: WFL for tasks assessed/performed Overall Cognitive Status: Within Functional Limits for tasks assessed                      Exercises      General Comments General comments (skin integrity, edema, etc.): performed modified hallpike R/L and supine head roll R/L to test for BPPV without noted c/o dizziness (some mild nystagmus with end gaze couple of beats, but pt asymptomatic)      Pertinent Vitals/Pain Pain Assessment: No/denies pain    Home Living                      Prior Function            PT Goals (current goals can now be found  in the care plan section) Progress towards PT goals: Progressing toward goals    Frequency  Min 3X/week    PT Plan Current plan remains appropriate    Co-evaluation             End of Session Equipment Utilized During Treatment: Gait belt Activity Tolerance: Patient tolerated treatment well Patient left: in bed;with call bell/phone within reach     Time: 1328-1359 PT Time Calculation (min) (ACUTE ONLY): 31 min  Charges:  $Gait Training: 8-22  mins $Therapeutic Activity: 8-22 mins                    G Codes:      Reginia Naas 24-Sep-2015, 4:44 PM  Magda Kiel, [place] 2015/09/24

## 2015-09-23 NOTE — NC FL2 (Deleted)
[place] LEVEL OF CARE SCREENING TOOL     IDENTIFICATION  Patient Name: Tammy Beard Birthdate: June 14, 1940 Sex: female Admission Date (Current Location): 09/21/2015  [facility] and [place] Number:  Herbalist and Address:  [facility], [place] [address] [place], [place] [address]      Provider Number: [id number]  Attending Physician Name and Address:  Eugenie Filler, MD  Relative Name and Phone Number:       Current Level of Care: Hospital Recommended Level of Care: [place] Prior Approval Number:    Date Approved/Denied:   PASRR Number: [id number]  Discharge Plan: SNF    Current Diagnoses: Patient Active Problem List   Diagnosis Date Noted  . UTI (urinary tract infection) 09/22/2015  . Acute CVA (cerebrovascular accident) ([place]) 09/22/2015  . Stroke ([place]) 09/21/2015  . AKI (acute kidney injury) ([place]) 09/21/2015  . Cerebrovascular accident (CVA) ([place])   . Disequilibrium   . Dizzy 09/18/2015  . 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 Diastasis recti 01/23/2015  . Pre-diabetes 03/07/2014  . Heart murmur 03/07/2014  . Polymyalgia rheumatica ([place]) 12/08/2013  . SLEEP DISORDER 06/28/2009  . URINARY FREQUENCY 06/28/2009  . Dyslipidemia 11/09/2008  . Essential hypertension 06/18/2007  . ALLERGIC RHINITIS 06/18/2007  . HOARSENESS 06/18/2007  . Ulcerative colitis ([place]) 06/08/2007  . OSTEOPENIA 06/08/2007  . MURMUR 06/08/2007    Orientation RESPIRATION BLADDER Height & Weight     Self, Time, Situation, Place  Normal Continent Weight: 121 lb 14.6 oz (55.3 kg) Height:  5\' 1"  (154.9 cm)  BEHAVIORAL SYMPTOMS/MOOD NEUROLOGICAL BOWEL NUTRITION STATUS   (none)  (None) Continent Diet (Heart)  AMBULATORY STATUS COMMUNICATION OF NEEDS Skin   Extensive Assist Verbally Normal                       Personal Care Assistance Level of Assistance  Bathing, Feeding, Dressing Bathing Assistance: Limited  assistance Feeding assistance: Independent Dressing Assistance: Limited assistance     Functional Limitations Info  Sight, Hearing, Speech Sight Info: Adequate Hearing Info: Impaired Speech Info: Adequate    SPECIAL CARE FACTORS FREQUENCY  PT (By licensed PT), OT (By licensed OT)     PT Frequency: 5/ week OT Frequency: 5/ week            Contractures Contractures Info: Not present    Additional Factors Info  Code Status  Allergies Code Status Info: DNR  NKDA           Current Medications (09/23/2015):  This is the current hospital active medication list Current Facility-Administered Medications  Medication Dose Route Frequency Provider Last Rate Last Dose  . 0.9 %  sodium chloride infusion   Intravenous Continuous Eugenie Filler, MD 75 mL/hr at 09/23/15 1359    . acetaminophen (TYLENOL) tablet 650 mg  650 mg Oral Q6H PRN Ivor Costa, MD      . aspirin suppository 300 mg  300 mg Rectal Daily Ivor Costa, MD       Or  . aspirin tablet 325 mg  325 mg Oral Daily Ivor Costa, MD   325 mg at 09/23/15 0859  . calcium-vitamin D (OSCAL WITH D) 500-200 MG-UNIT per tablet 1 tablet  1 tablet Oral Daily Ivor Costa, MD   1 tablet at 09/23/15 0859  . cefTRIAXone (ROCEPHIN) 1 g in dextrose 5 % 50 mL IVPB  1 g Intravenous Q24H Eugenie Filler, MD   1 g  at 09/23/15 0900  . enoxaparin (LOVENOX) injection 40 mg  40 mg Subcutaneous Q24H Eugenie Filler, MD   40 mg at 09/23/15 0900  . famotidine (PEPCID) tablet 10 mg  10 mg Oral Daily Ivor Costa, MD   10 mg at 09/23/15 0859  . fluticasone (FLONASE) 50 MCG/ACT nasal spray 1 spray  1 spray Each Nare Daily PRN Ivor Costa, MD      . insulin aspart (novoLOG) injection 0-15 Units  0-15 Units Subcutaneous TID WC Eugenie Filler, MD   3 Units at 09/23/15 1231  . mesalamine (LIALDA) EC tablet 2.4 g  2.4 g Oral Q breakfast Ivor Costa, MD   2.4 g at 09/23/15 0856  . multivitamin with minerals tablet 1 tablet  1 tablet Oral Daily Ivor Costa, MD   1 tablet  at 09/23/15 0859  . ondansetron (ZOFRAN) injection 4 mg  4 mg Intravenous Q8H PRN Ivor Costa, MD   4 mg at 09/23/15 0900  . perflutren lipid microspheres (DEFINITY) IV suspension  1-10 mL Intravenous PRN Ivor Costa, MD   2 mL at 09/23/15 1523  . predniSONE (DELTASONE) tablet 10 mg  10 mg Oral Daily Ivor Costa, MD   10 mg at 09/23/15 0859  . senna-docusate (Senokot-S) tablet 1 tablet  1 tablet Oral QHS PRN Ivor Costa, MD         Discharge Medications: Please see discharge summary for a list of discharge medications.  Relevant Imaging Results:  Relevant Lab Results:   Additional Information [id number]  Samule Dry, LCSW

## 2015-09-24 ENCOUNTER — Encounter (HOSPITAL_COMMUNITY): Payer: Self-pay | Admitting: *Deleted

## 2015-09-24 ENCOUNTER — Inpatient Hospital Stay (HOSPITAL_COMMUNITY): Payer: Medicare Other

## 2015-09-24 DIAGNOSIS — I639 Cerebral infarction, unspecified: Secondary | ICD-10-CM

## 2015-09-24 LAB — GLUCOSE, CAPILLARY
GLUCOSE-CAPILLARY: 103 mg/dL — AB (ref 65–99)
GLUCOSE-CAPILLARY: 189 mg/dL — AB (ref 65–99)
GLUCOSE-CAPILLARY: 217 mg/dL — AB (ref 65–99)
GLUCOSE-CAPILLARY: 147 mg/dL — AB (ref 65–99)

## 2015-09-24 LAB — CBC
HCT: 36.1 % (ref 36.0–46.0)
Hemoglobin: 11.2 g/dL — ABNORMAL LOW (ref 12.0–15.0)
MCH: 28.9 pg (ref 26.0–34.0)
MCHC: 31 g/dL (ref 30.0–36.0)
MCV: 93 fL (ref 78.0–100.0)
PLATELETS: 127 10*3/uL — AB (ref 150–400)
RBC: 3.88 MIL/uL (ref 3.87–5.11)
RDW: 13.4 % (ref 11.5–15.5)
WBC: 4.2 10*3/uL (ref 4.0–10.5)

## 2015-09-24 LAB — BASIC METABOLIC PANEL
Anion gap: 5 (ref 5–15)
BUN: 11 mg/dL (ref 6–20)
CALCIUM: 8.8 mg/dL — AB (ref 8.9–10.3)
CO2: 28 mmol/L (ref 22–32)
CREATININE: 1.05 mg/dL — AB (ref 0.44–1.00)
Chloride: 103 mmol/L (ref 101–111)
GFR calc Af Amer: 59 mL/min — ABNORMAL LOW (ref >60)
GFR, EST NON AFRICAN AMERICAN: 51 mL/min — AB (ref >60)
GLUCOSE: 97 mg/dL (ref 65–99)
POTASSIUM: 3.6 mmol/L (ref 3.5–5.1)
SODIUM: 136 mmol/L (ref 135–145)

## 2015-09-24 LAB — URINE CULTURE

## 2015-09-24 LAB — C-REACTIVE PROTEIN: CRP: 1.1 mg/dL — ABNORMAL HIGH (ref <1.0)

## 2015-09-24 LAB — SEDIMENTATION RATE: SED RATE: 45 mm/h — AB (ref 0–22)

## 2015-09-24 MED ORDER — AMLODIPINE BESYLATE 5 MG PO TABS: 5.0000 mg | ORAL_TABLET | Freq: Every day | ORAL | Status: DC

## 2015-09-24 MED ORDER — POTASSIUM CHLORIDE CRYS ER 20 MEQ PO TBCR
40.0000 meq | EXTENDED_RELEASE_TABLET | Freq: Once | ORAL | Status: AC
  Administered 2015-09-24: 40 meq via ORAL
  Filled 2015-09-24: qty 2

## 2015-09-24 MED ORDER — AMLODIPINE BESYLATE 5 MG PO TABS
5.0000 mg | ORAL_TABLET | Freq: Every day | ORAL | Status: DC
  Administered 2015-09-24 – 2015-09-26 (×3): 5 mg via ORAL
  Filled 2015-09-24 (×4): qty 1

## 2015-09-24 MED ORDER — CLOPIDOGREL BISULFATE 75 MG PO TABS
75.0000 mg | ORAL_TABLET | Freq: Every day | ORAL | Status: DC
  Administered 2015-09-24 – 2015-09-26 (×3): 75 mg via ORAL
  Filled 2015-09-24 (×3): qty 1

## 2015-09-24 MED ORDER — HYDRALAZINE HCL 20 MG/ML IJ SOLN
10.0000 mg | Freq: Four times a day (QID) | INTRAMUSCULAR | Status: DC | PRN
  Administered 2015-09-25: 10 mg via INTRAVENOUS
  Filled 2015-09-24: qty 1

## 2015-09-24 MED ORDER — CEFUROXIME AXETIL 500 MG PO TABS
500.0000 mg | ORAL_TABLET | Freq: Two times a day (BID) | ORAL | Status: DC
  Administered 2015-09-24 – 2015-09-26 (×5): 500 mg via ORAL
  Filled 2015-09-24 (×6): qty 1

## 2015-09-24 MED ORDER — IOPAMIDOL (ISOVUE-370) INJECTION 76%
50.0000 mL | Freq: Once | INTRAVENOUS | Status: AC | PRN
  Administered 2015-09-24: 50 mL via INTRAVENOUS

## 2015-09-24 MED ORDER — ATORVASTATIN CALCIUM 10 MG PO TABS
10.0000 mg | ORAL_TABLET | Freq: Every day | ORAL | Status: DC
  Administered 2015-09-24 – 2015-09-25 (×2): 10 mg via ORAL
  Filled 2015-09-24 (×2): qty 1

## 2015-09-24 MED ORDER — IOPAMIDOL (ISOVUE-M 300) INJECTION 61%: 15.0000 mL | Freq: Once | INTRAMUSCULAR | Status: DC | PRN

## 2015-09-24 NOTE — Progress Notes (Signed)
STROKE TEAM PROGRESS NOTE   SUBJECTIVE (INTERVAL HISTORY) Family is at the bedside. Headed to radiology for CTA. Pt had dizziness and then fell. MRI showed tiny right ACA infarct.     OBJECTIVE Temp:  [97.9 F (36.6 C)-98.2 F (36.8 C)] 98.2 F (36.8 C) (07/03 0412) Pulse Rate:  [62-79] 62 (07/03 0648) Cardiac Rhythm:  [-] Normal sinus rhythm (07/03 0700) Resp:  [16-17] 16 (07/03 0412) BP: (128-193)/(60-87) 128/60 mmHg (07/03 1002) SpO2:  [96 %-99 %] 99 % (07/03 0412)  CBC:   Recent Labs Lab 09/22/15 0832 09/24/15 0539  WBC 8.7 4.2  NEUTROABS 7.7  --   HGB 11.8* 11.2*  HCT 37.4 36.1  MCV 91.2 93.0  PLT 178 127*    Basic Metabolic Panel:   Recent Labs Lab 09/22/15 0832 09/23/15 0412 09/24/15 0539  NA 133* 135 136  K 3.7 3.5 3.6  CL 97* 102 103  CO2 25 27 28   GLUCOSE 123* 91 97  BUN 19 15 11   CREATININE 1.07* 1.05* 1.05*  CALCIUM 8.5* 8.3* 8.8*  MG 2.1  --   --     Lipid Panel:     Component Value Date/Time   CHOL 180 08/23/2015   TRIG 222* 08/23/2015   TRIG 115 02/05/2006 1016   HDL 73* 08/23/2015   CHOLHDL 3 11/14/2008 1037   CHOLHDL 2.9 CALC 02/05/2006 1016   VLDL 25.2 11/14/2008 1037   LDLCALC 63 08/23/2015   HgbA1c:  Lab Results  Component Value Date   HGBA1C 6.0 08/23/2015   Urine Drug Screen: No results found for: LABOPIA, COCAINSCRNUR, LABBENZ, AMPHETMU, THCU, LABBARB    IMAGING I have personally reviewed the radiological images below and agree with the radiology interpretations.  Dg Chest 2 View 09/21/2015   COPD.  No evidence of pneumothorax or other acute findings. Aortic atherosclerosis noted.   Ct Head Wo Contrast 09/21/2015   No acute intracranial abnormalities. Generalized atrophy. Mega cisterna magna.   Mr Brain Wo Contrast 09/21/2015   Patient was not able to complete the examination. Sequences obtained are motion degraded. Question tiny acute infarct medial aspect posterior right frontal -parietal lobe junction. Remote  infarct superior vermis. No intracranial hemorrhage. Mild chronic microvascular changes. Moderate global atrophy without hydrocephalus.   Mr Jodene Nam Head/brain Wo Cm 09/22/2015   Severely limited study due to motion artifact. No large or proximal arterial branch occlusion. No definite high-grade or correctable stenosis, although evaluation is fairly limited.   Ct Angio Head & Neck W Or Wo Contrast 09/24/2015   1. Negative for emergent large vessel occlusion but positive for SEVERE intracranial arterial stenoses: - Left ICA terminus (CRITICAL stenosis - see series 604 image 88), - Right MCA distal M1 and bifurcation (SEVERE stenoses) with attenuated Right MCA branches. 2. Atherosclerosis with mild to moderate stenosis of the right ICA siphon and left MCA M1 segment. 3. No extracranial arterial stenosis. Tortuous cervical carotid and vertebral arteries. 4. Stable CT appearance of the brain. 5. Small layering left pleural effusion. Apical pulmonary scarring greater on the right.   2D Echocardiogram  - Left ventricle: The cavity size was normal. Wall thickness was increased in a pattern of mild LVH. Systolic function was normal. The estimated ejection fraction was in the range of 50% to 55%. Features are consistent with a pseudonormal left ventricular filling pattern, with concomitant abnormal relaxation and increased filling pressure (grade 2 diastolic dysfunction). - Aortic valve: Mildly calcified annulus. Mildly thickened, mildly calcified leaflets. There was trivial regurgitation. -  Mitral valve: Mildly calcified annulus. Mildly thickened leaflets  LE venous doppler - Negative for deep and superficial vein thrombosis in both leg    PHYSICAL EXAM Frail elderly pleasant caucasian lady not in distress. . Afebrile. Head is nontraumatic. Neck is supple without bruit.    Cardiac exam no murmur or gallop. Lungs are clear to auscultation. Distal pulses are well felt.  Neurological Exam ;  Awake  Alert oriented  x 3. Normal speech and language.eye movements full without nystagmus.fundi were not visualized. Vision acuity and fields appear normal. Hearing is normal. Palatal movements are normal. Face symmetric. Tongue midline. Normal strength, tone, reflexes and coordination. Normal sensation. Gait deferred.   ASSESSMENT/PLAN Tammy Beard is a 75 y.o. female with history of hypertension, hyperlipidemia, and previous stroke ( by MRI )  presenting with dizziness and unstable gait. She did not receive IV t-PA due to late presentation.  Stroke:  R ACA tiny infarct likely secondary to intracranial stenosis, but cannot rule out embolic source. Dizziness and unsteady gait also likely related to severe intracranial stenosis in setting of dehydration and UTI  Resultant - no focal deficits  MRI - tiny acute right ACA infarct  CTA head and neck SEVERE IC stenosis b/l ICA terminus, b/l MCA M1 and bifurcation with attenuated R MCA branches.   LE venous Doppler no DVT   2D Echo - EF 50-55%. No source of embolus   30 day cardiac monitor recommended at discharge  LDL - 08/23/2015 - 63  HgbA1c 08/23/2015 - 6.0   VTE prophylaxis - Lovenox Diet Heart Room service appropriate?: Yes; Fluid consistency:: Thin  No antithrombotic prior to admission, now on aspirin 325 mg daily. Given large vessel intracranial atherosclerosis, patient should be treated with aspirin 325 mg and clopidogrel 75 mg orally every day x 3 months for secondary stroke prevention. After 3 months, change to plavix alone.  Due to severe intracranial stenosis, also recommend statin with low dose lipitor although LDL 63  Long-term dual antiplatelets are contraindicated due to risk for intracerebral hemorrhage.   Patient counseled to be compliant with her antithrombotic medications  Ongoing aggressive stroke risk factor management  Therapy recommendations: SNF placement recommended  Disposition:  Pending  Hypertension  Stable  Permissive hypertension (OK if < 220/120) but gradually normalize in 5-7 days  Long-term BP goal 130-150 due to severe intracranial stenosis  Other Stroke Risk Factors  Advanced age  ETOH use, advised to drink no more than 1 to 2 drinks per day  Hx stroke/TIA  Other Active Problems  Pyuria - culture w/ multiple species - IV Rocephin x2 days, changed to ceftin po  Leukocytosis - resolved  Hyperglycemia - glucose 304 on admission (on prednisone for polymyalgia rheumatica)  Dehydration - improving (BUN - 35 ; creatinine - 1.24 on admission )  Hospital day # 1  Neurology will sign off. Please call with questions. Pt will follow up with Dr. Erlinda Hong at [facility] in about 2 months. Thanks for the consult.  Tammy Hawking, MD PhD Stroke Neurology 09/24/2015 5:45 PM   To contact Stroke Continuity provider, please refer to [email or web address] After hours, contact General Neurology

## 2015-09-24 NOTE — Progress Notes (Signed)
Preliminary results by tech - Venous Duplex Lower Ext. Bilateral Completed. Negative for deep and superficial vein thrombosis in both leg.  Oda Cogan, BS, RDMS, RVT

## 2015-09-24 NOTE — Care Management Important Message (Signed)
Important Message  Patient Details  Name: Tammy Beard MRN: [id number] Date of Birth: 04-Nov-1940   Medicare Important Message Given:  Yes    Nathen May 09/24/2015, 11:49 AM

## 2015-09-24 NOTE — Progress Notes (Signed)
PROGRESS NOTE    Tammy Beard  [id number] DOB: [birth year] DOA: 09/21/2015 PCP: [name], MD   Brief Narrative:   Tammy Beard is a 75 y.o. female with medical history significant of hypertension, hyperlipidemia, GERD, ulcerative colitis, osteoporosis, heart murmur, melanoma, polio, polymyalgia, rheumatoid arthritis, poor hearing, who presents with dizziness, nausea and vomiting.  Patient has been having dizziness and poor balance for 5 days. Patient has a feeling that the room is spinning around her. She also has nausea and vomited 3 times on Tuesday, but no vomiting today. Patient does not have diarrhea or abdominal pain. She does not have unilateral weakness, numbness or tingling sensations. She has poor hearing, which is chronic issue, no vision change.   Pt states that she was answering door, when it opened and hit her on her head today. Pt sat down afterwards and later fell. No LOC, or neck/back pain. Patient does not have chest pain, shortness of breath, cough, symptoms of UTI. Per report, EMS found pt was slightly orthostatic (systolic BP dropped from [id number] to 106). EMS gave 467ml NS and zofran for nausea. Pt was given prescription dimenhydrinate by PCP. She states that she did not get this medication until today, but states that she got it from her friend and took it without help.  ED Course: pt was found to have WBC 13.6, temperature normal, transient bradycardia, AKI with cre 1.24 and BUN 35, pending UA and urine culture. CT head is negative for acute intracranial abnormalities. MRI of brain has motion degradation, but showed questionable tiny acute infarct medial aspect posterior right frontal,-parietal lobe junction; remote infarct superior vermis. Pt is placed on tele bed for obs. Neuro was consulted.   Assessment & Plan:   Principal Problem:   Stroke [facility]) Active Problems:   Dyslipidemia   Essential hypertension   Ulcerative colitis ([place])   Polymyalgia  rheumatica ([facility])   Heart murmur   Dizzy   AKI (acute kidney injury) ([place])   UTI (urinary tract infection)   Acute CVA (cerebrovascular accident) ([place])  #1 acute medial right frontoparietal small infarction Patient hasn't been up ambulating and a such unable to determine whether she still dizzy. 2-D echo with EF of 50-55% with grade 2 diastolic dysfunction.Also of cardiac emboli. Mild LVH. CT angiogram of head and neck with severe intracranial stenosis bilateral ICA terminus, B/L MCA M1 and bifurcation with attenuated right MCA branches. Lower extremity Dopplers negative for DVT. Fasting lipid panel with LDL of 63 which is at goal. PT/OT. Risk factor modification. Continue aspirin and Plavix for secondary stroke prevention. Continue dual antiplatelet therapy for 3 months and then Plavix daily. Patient will likely need a 30 day cardiac event monitor at discharge per psych recommendations. Neurology also recommended low dose Lipitor due to severe intracranial stenosis. Neurology following.  #2 probable UTI Urine cultures with multiple species. Change IV Rocephin to oral Ceftin to complete a course of antibiotic treatment.   #3 dizziness versus vertigo Likely secondary to poor oral intake. Patient clinically dry on examination. PT/OT pending. MRI of the head with a small right frontoparietal infarction. Stroke workup underway. IV fluids. Supportive care.  #4 dehydration NSL IV fluids.  #5 acute kidney injury Likely secondary to prerenal azotemia in the setting of ACE inhibitor diuretics. Renal function improving. Diuretics and ACE inhibitor on hold. Follow.  #6 ulcerative colitis Stable. Continue mesalamine. Continue prednisone.  #7 polymyalgia rheumatica Continue steroids.  #8 hyertension BP meds on hold secondary to acute kidney  injury and dehydration. Start low dose norvasc,Follow BP.     DVT prophylaxis: Lovenox Code Status: DO NOT RESUSCITATE Family Communication: Updated patient  and sister at bedside. Disposition Plan: SNF. Pending stroke workup pending PT evaluation.    Consultants:   Neurology: Dr. Nicole Kindred 09/22/2015  Procedures:   CT Head  09/21/2015 chest x-ray 09/21/2015 MRI/MRA head 09/21/2015, 09/22/2015 2-D echo 09/23/2015 CT angiogram head and neck 09/24/2015  Antimicrobials:   IV Rocephin 09/22/2015>>>> 09/24/2015  Oral Ceftin 09/24/2015   Subjective: Patient denies any chest. No shortness of breath. Patient denies any further nausea or emesis. Patient states dizziness improved. Tolerating current diet.  Objective: Filed Vitals:   09/24/15 0031 09/24/15 0412 09/24/15 0648 09/24/15 1002  BP: 174/87 193/70 168/61 128/60  Pulse: 72 64 62   Temp: 97.9 F (36.6 C) 98.2 F (36.8 C)    TempSrc:      Resp: 16 16    Height:      Weight:      SpO2: 99% 99%      Intake/Output Summary (Last 24 hours) at 09/24/15 1302 Last data filed at 09/24/15 0900  Gross per 24 hour  Intake 3326.67 ml  Output   1050 ml  Net 2276.67 ml   Filed Weights   09/22/15 0119  Weight: 55.3 kg (121 lb 14.6 oz)    Examination:  General exam: Appears calm and comfortable. Respiratory system: Clear to auscultation anterior lung fields. Respiratory effort normal. Cardiovascular system: 3/6 SEM. No JVD, murmurs, rubs, gallops or clicks. No pedal edema. Gastrointestinal system: Abdomen is nondistended, soft and nontender. No organomegaly or masses felt. Normal bowel sounds heard. Central nervous system: Alert and oriented. No focal neurological deficits. Extremities: Symmetric 5 x 5 power. Skin: No rashes, lesions or ulcers Psychiatry: Judgement and insight appear normal. Mood & affect appropriate.     Data Reviewed: I have personally reviewed following labs and imaging studies  CBC:  Recent Labs Lab 09/21/15 1554 09/22/15 0832 09/24/15 0539  WBC 13.6* 8.7 4.2  NEUTROABS  --  7.7  --   HGB 12.4 11.8* 11.2*  HCT 37.7 37.4 36.1  MCV 88.9 91.2 93.0    PLT 216 178 [id number]   Basic Metabolic Panel:  Recent Labs Lab 09/21/15 1554 09/22/15 0832 09/23/15 0412 09/24/15 0539  NA 130* 133* 135 136  K 3.7 3.7 3.5 3.6  CL 94* 97* 102 103  CO2 27 25 27 28   GLUCOSE 304* 123* 91 97  BUN 35* 19 15 11   CREATININE 1.24* 1.07* 1.05* 1.05*  CALCIUM 8.5* 8.5* 8.3* 8.8*  MG  --  2.1  --   --    GFR: Estimated Creatinine Clearance: 34.9 mL/min (by C-G formula based on Cr of 1.05). Liver Function Tests: No results for input(s): AST, ALT, ALKPHOS, BILITOT, PROT, ALBUMIN in the last 168 hours. No results for input(s): LIPASE, AMYLASE in the last 168 hours. No results for input(s): AMMONIA in the last 168 hours. Coagulation Profile:  Recent Labs Lab 09/21/15 2131  INR 1.07   Cardiac Enzymes: No results for input(s): CKTOTAL, CKMB, CKMBINDEX, TROPONINI in the last 168 hours. BNP (last 3 results) No results for input(s): PROBNP in the last 8760 hours. HbA1C: No results for input(s): HGBA1C in the last 72 hours. CBG:  Recent Labs Lab 09/23/15 1209 09/23/15 1742 09/23/15 2131 09/24/15 0741 09/24/15 1125  GLUCAP 167* 124* 143* 103* 189*   Lipid Profile: No results for input(s): CHOL, HDL, LDLCALC, TRIG, CHOLHDL, LDLDIRECT in the  last 72 hours. Thyroid Function Tests: No results for input(s): TSH, T4TOTAL, FREET4, T3FREE, THYROIDAB in the last 72 hours. Anemia Panel: No results for input(s): VITAMINB12, FOLATE, FERRITIN, TIBC, IRON, RETICCTPCT in the last 72 hours. Sepsis Labs: No results for input(s): PROCALCITON, LATICACIDVEN in the last 168 hours.  Recent Results (from the past 240 hour(s))  MRSA PCR Screening     Status: None   Collection Time: 09/22/15  1:30 AM  Result Value Ref Range Status   MRSA by PCR NEGATIVE NEGATIVE Final    Comment:        The GeneXpert MRSA Assay (FDA approved for NASAL specimens only), is one component of a comprehensive MRSA colonization surveillance program. It is not intended to diagnose  MRSA infection nor to guide or monitor treatment for MRSA infections.   Urine culture     Status: Abnormal   Collection Time: 09/22/15 10:37 PM  Result Value Ref Range Status   Specimen Description URINE, RANDOM  Final   Special Requests NONE  Final   Culture MULTIPLE SPECIES PRESENT, SUGGEST RECOLLECTION (A)  Final   Report Status 09/24/2015 FINAL  Final         Radiology Studies: Ct Angio Head W Or Wo Contrast  09/24/2015  CLINICAL DATA:  75 year old female with recent dizziness, nausea vomiting. Questionable punctate infarct in the posterior aspect of the right anterior circulation territory on recent brain MRI. Initial encounter. EXAM: CT ANGIOGRAPHY HEAD AND NECK TECHNIQUE: Multidetector CT imaging of the head and neck was performed using the standard protocol during bolus administration of intravenous contrast. Multiplanar CT image reconstructions and MIPs were obtained to evaluate the vascular anatomy. Carotid stenosis measurements (when applicable) are obtained utilizing NASCET criteria, using the distal internal carotid diameter as the denominator. CONTRAST:  50 mL Isovue 370 COMPARISON:  Intracranial MRA 09/22/2015. Brain MRI 09/21/2015 and noncontrast head CT 09/21/2015 FINDINGS: CT HEAD Brain: Stable gray-white matter differentiation throughout the brain. No cortically based acute infarct identified. No acute intracranial hemorrhage identified. No midline shift, mass effect, or evidence of intracranial mass lesion. Stable ventricle size and configuration. Calvarium and skull base: Stable. No acute osseous abnormality identified. Paranasal sinuses: Visualized paranasal sinuses and mastoids are stable and well pneumatized . Orbits: Stable. CTA NECK Skeleton: No acute osseous abnormality identified. Chronic fusion of the left lateral first and second ribs. Mild thoracic kyphoscoliosis. Other neck: Small layering left pleural effusion. Apical scarring greater on the right with  consolidation and some bronchiectasis. No superior mediastinal lymphadenopathy. Negative thyroid, larynx, pharynx, parapharyngeal spaces, retropharyngeal space, sublingual space, submandibular glands and parotid glands. No cervical lymphadenopathy. Aortic arch: 3 vessel arch configuration with minimal arch atherosclerosis and no great vessel origin stenosis. Right carotid system: Negative.  Tortuous cervical right ICA. Left carotid system: Tortuosity. Minimal atherosclerosis at the left ICA origin and bulb with no stenosis. Vertebral arteries:No proximal subclavian stenosis. Normal right vertebral artery origin. Tortuous but otherwise normal left vertebral artery origin. No vertebral stenosis in the neck. CTA HEAD Posterior circulation: Normal distal vertebral arteries. Normal PICA origins and vertebrobasilar junction. No basilar stenosis. Normal SCA and PCA origins. Posterior communicating arteries are diminutive or absent. Bilateral PCA branches are normal. Anterior circulation: Both ICA siphons remain patent. However, there is focal very high-grade noncalcified stenosis of the distal left ICA just proximal to the terminus as seen on series 604, image 88 and series 602, image 25. There is superimposed calcified ICA siphon plaque, and on the right there is mild to moderate  stenosis at the right ICA genu. There is a mild poststenotic dilatation of the left ICA terminus. The left MCA and ACA origins remain patent. The ACA A1 segments appear fairly codominant. Diminutive or absent anterior communicating artery. Bilateral ACA branches are within normal limits. Left MCA M1 segment is patent but mildly irregular throughout. There does appear to be a left mid M1 mild stenosis. The left MCA branches then are within normal limits. The right MCA origin and proximal M1 segment are normal, but there is severe stenosis in the distal M1 and at the bifurcation (series 607, image 44). Subsequently, the right MCA M2 and distal  branches appeared attenuated diffusely compared to those on the left (series 606, image 8). Venous sinuses: Patent. Anatomic variants: None. Delayed phase: No abnormal enhancement identified. IMPRESSION: 1. Negative for emergent large vessel occlusion but positive for SEVERE intracranial arterial stenoses: - Left ICA terminus (CRITICAL stenosis - see series 604 image 88), - Right MCA distal M1 and bifurcation (SEVERE stenoses) with attenuated Right MCA branches. 2. Atherosclerosis with mild to moderate stenosis of the right ICA siphon and left MCA M1 segment. 3. No extracranial arterial stenosis. Tortuous cervical carotid and vertebral arteries. 4. Stable CT appearance of the brain. 5. Small layering left pleural effusion. Apical pulmonary scarring greater on the right. Electronically Signed   By: Genevie Ann M.D.   On: 09/24/2015 12:50   Ct Angio Neck W Or Wo Contrast  09/24/2015  CLINICAL DATA:  75 year old female with recent dizziness, nausea vomiting. Questionable punctate infarct in the posterior aspect of the right anterior circulation territory on recent brain MRI. Initial encounter. EXAM: CT ANGIOGRAPHY HEAD AND NECK TECHNIQUE: Multidetector CT imaging of the head and neck was performed using the standard protocol during bolus administration of intravenous contrast. Multiplanar CT image reconstructions and MIPs were obtained to evaluate the vascular anatomy. Carotid stenosis measurements (when applicable) are obtained utilizing NASCET criteria, using the distal internal carotid diameter as the denominator. CONTRAST:  50 mL Isovue 370 COMPARISON:  Intracranial MRA 09/22/2015. Brain MRI 09/21/2015 and noncontrast head CT 09/21/2015 FINDINGS: CT HEAD Brain: Stable gray-white matter differentiation throughout the brain. No cortically based acute infarct identified. No acute intracranial hemorrhage identified. No midline shift, mass effect, or evidence of intracranial mass lesion. Stable ventricle size and  configuration. Calvarium and skull base: Stable. No acute osseous abnormality identified. Paranasal sinuses: Visualized paranasal sinuses and mastoids are stable and well pneumatized . Orbits: Stable. CTA NECK Skeleton: No acute osseous abnormality identified. Chronic fusion of the left lateral first and second ribs. Mild thoracic kyphoscoliosis. Other neck: Small layering left pleural effusion. Apical scarring greater on the right with consolidation and some bronchiectasis. No superior mediastinal lymphadenopathy. Negative thyroid, larynx, pharynx, parapharyngeal spaces, retropharyngeal space, sublingual space, submandibular glands and parotid glands. No cervical lymphadenopathy. Aortic arch: 3 vessel arch configuration with minimal arch atherosclerosis and no great vessel origin stenosis. Right carotid system: Negative.  Tortuous cervical right ICA. Left carotid system: Tortuosity. Minimal atherosclerosis at the left ICA origin and bulb with no stenosis. Vertebral arteries:No proximal subclavian stenosis. Normal right vertebral artery origin. Tortuous but otherwise normal left vertebral artery origin. No vertebral stenosis in the neck. CTA HEAD Posterior circulation: Normal distal vertebral arteries. Normal PICA origins and vertebrobasilar junction. No basilar stenosis. Normal SCA and PCA origins. Posterior communicating arteries are diminutive or absent. Bilateral PCA branches are normal. Anterior circulation: Both ICA siphons remain patent. However, there is focal very high-grade noncalcified stenosis of the distal  left ICA just proximal to the terminus as seen on series 604, image 88 and series 602, image 25. There is superimposed calcified ICA siphon plaque, and on the right there is mild to moderate stenosis at the right ICA genu. There is a mild poststenotic dilatation of the left ICA terminus. The left MCA and ACA origins remain patent. The ACA A1 segments appear fairly codominant. Diminutive or absent  anterior communicating artery. Bilateral ACA branches are within normal limits. Left MCA M1 segment is patent but mildly irregular throughout. There does appear to be a left mid M1 mild stenosis. The left MCA branches then are within normal limits. The right MCA origin and proximal M1 segment are normal, but there is severe stenosis in the distal M1 and at the bifurcation (series 607, image 44). Subsequently, the right MCA M2 and distal branches appeared attenuated diffusely compared to those on the left (series 606, image 8). Venous sinuses: Patent. Anatomic variants: None. Delayed phase: No abnormal enhancement identified. IMPRESSION: 1. Negative for emergent large vessel occlusion but positive for SEVERE intracranial arterial stenoses: - Left ICA terminus (CRITICAL stenosis - see series 604 image 88), - Right MCA distal M1 and bifurcation (SEVERE stenoses) with attenuated Right MCA branches. 2. Atherosclerosis with mild to moderate stenosis of the right ICA siphon and left MCA M1 segment. 3. No extracranial arterial stenosis. Tortuous cervical carotid and vertebral arteries. 4. Stable CT appearance of the brain. 5. Small layering left pleural effusion. Apical pulmonary scarring greater on the right. Electronically Signed   By: Genevie Ann M.D.   On: 09/24/2015 12:50        Scheduled Meds: . amLODipine  5 mg Oral Daily  . aspirin  300 mg Rectal Daily   Or  . aspirin  325 mg Oral Daily  . calcium-vitamin D  1 tablet Oral Daily  . cefUROXime  500 mg Oral BID WC  . famotidine  10 mg Oral Daily  . insulin aspart  0-15 Units Subcutaneous TID WC  . mesalamine  2.4 g Oral Q breakfast  . multivitamin with minerals  1 tablet Oral Daily  . predniSONE  10 mg Oral Daily   Continuous Infusions:     LOS: 2 days    Time spent: 35 minutes    Tammy Beard,DANIEL, MD [facility] Pager [phone]  If 7PM-7AM, please contact night-coverage [email or web address] Password [email or web address] 09/24/2015, 1:02 PM

## 2015-09-24 NOTE — Progress Notes (Signed)
Per MD pt possibly stable for [facility] updated and will be ready to accept pt to their SNF portion tomorrow.  CSW called pt family and updated  Domenica Reamer, [place] Social Worker [phone]

## 2015-09-24 NOTE — Progress Notes (Signed)
Patient BP 193/70, On-call NP [place] notified.  NP notified of previous BP of 174/87 at 0031 and advised to monitor BP at that time.  Awaiting NP orders and will continue to monitor patient.

## 2015-09-25 DIAGNOSIS — E785 Hyperlipidemia, unspecified: Secondary | ICD-10-CM

## 2015-09-25 DIAGNOSIS — I471 Supraventricular tachycardia: Secondary | ICD-10-CM

## 2015-09-25 LAB — CBC
HEMATOCRIT: 39 % (ref 36.0–46.0)
HEMOGLOBIN: 12.4 g/dL (ref 12.0–15.0)
MCH: 29 pg (ref 26.0–34.0)
MCHC: 31.8 g/dL (ref 30.0–36.0)
MCV: 91.3 fL (ref 78.0–100.0)
Platelets: 148 10*3/uL — ABNORMAL LOW (ref 150–400)
RBC: 4.27 MIL/uL (ref 3.87–5.11)
RDW: 14 % (ref 11.5–15.5)
WBC: 9.8 10*3/uL (ref 4.0–10.5)

## 2015-09-25 LAB — BASIC METABOLIC PANEL
ANION GAP: 9 (ref 5–15)
BUN: 13 mg/dL (ref 6–20)
CALCIUM: 9.3 mg/dL (ref 8.9–10.3)
CO2: 27 mmol/L (ref 22–32)
Chloride: 99 mmol/L — ABNORMAL LOW (ref 101–111)
Creatinine, Ser: 1.09 mg/dL — ABNORMAL HIGH (ref 0.44–1.00)
GFR calc Af Amer: 56 mL/min — ABNORMAL LOW (ref >60)
GFR calc non Af Amer: 48 mL/min — ABNORMAL LOW (ref >60)
GLUCOSE: 120 mg/dL — AB (ref 65–99)
Potassium: 3.7 mmol/L (ref 3.5–5.1)
Sodium: 135 mmol/L (ref 135–145)

## 2015-09-25 LAB — GLUCOSE, CAPILLARY
GLUCOSE-CAPILLARY: 123 mg/dL — AB (ref 65–99)
Glucose-Capillary: 123 mg/dL — ABNORMAL HIGH (ref 65–99)
Glucose-Capillary: 167 mg/dL — ABNORMAL HIGH (ref 65–99)
Glucose-Capillary: 121 mg/dL — ABNORMAL HIGH (ref 65–99)

## 2015-09-25 LAB — MAGNESIUM: Magnesium: 2.2 mg/dL (ref 1.7–2.4)

## 2015-09-25 LAB — TSH: TSH: 2.008 u[IU]/mL (ref 0.350–4.500)

## 2015-09-25 MED ORDER — METOPROLOL TARTRATE 25 MG PO TABS
25.0000 mg | ORAL_TABLET | Freq: Two times a day (BID) | ORAL | Status: DC
  Administered 2015-09-25 – 2015-09-26 (×3): 25 mg via ORAL
  Filled 2015-09-25 (×3): qty 1

## 2015-09-25 NOTE — Care Management Note (Signed)
Case Management Note  Patient Details  Name: Tammy Beard MRN: [id number] Date of Birth: 1940-10-12  Subjective/Objective:                    Action/Plan: Plan is to d/c to SNF today. No further needs by CM.  Expected Discharge Date:   09/25/2015            Expected Discharge Plan:  [place] ([place])  In-House Referral:  Clinical Social Work  Discharge planning Services  CM Consult  Post Acute Care Choice:    Choice offered to:     DME Arranged:    DME Agency:     HH Arranged:    HH Agency:     Status of Service:  Completed, signed off  If discussed at [employer], dates discussed:    Additional Comments:  Sharin Mons, RN 09/25/2015, 8:54 AM

## 2015-09-25 NOTE — Progress Notes (Signed)
Occupational Therapy Treatment Patient Details Name: Tammy Beard MRN: [id number] DOB: 1940/04/01 Today's Date: 09/25/2015    History of present illness 75 y.o. female with medical history significant of hypertension, hyperlipidemia, GERD, ulcerative colitis, osteoporosis, heart murmur, melanoma, polio, polymyalgia, rheumatoid arthritis, poor hearing, who presents with dizziness, nausea and vomiting.Patient has been having dizziness and poor balance for 5 days. Patient has a feeling that the room is spinning around her. 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    OT comments  Pt continues to make good progress with ADL and mobility performance. Pt required min guard assist for all ADLs and basic transfers this session, but still requires max verbal/tactile cues to initiate and sequence through tasks. Pt was tachycardic throughout session (HR 103 at rest in supine, and up to 142 ambulating from bathroom to recliner, pt asymptomatic, RN notified). Updated pt's ADL goals and discharge disposition remains appropriate.   Follow Up Recommendations  SNF;Supervision/Assistance - 24 hour    Equipment Recommendations  Other (comment) (TBD at next venue)    Recommendations for Other Services      Precautions / Restrictions Precautions Precautions: Fall Restrictions Weight Bearing Restrictions: No       Mobility Bed Mobility Overal bed mobility: Needs Assistance Bed Mobility: Supine to Sit     Supine to sit: Min guard     General bed mobility comments: Min guard assist for safety. Multimodal cues to initiate and sequence through transfer.   Transfers Overall transfer level: Needs assistance Equipment used: Rolling walker (2 wheeled) Transfers: Sit to/from Stand Sit to Stand: Min guard         General transfer comment: Min guard assist for safety. Multimodal cues to initiate transfer and to sequence throughout. VCs for safe hand placement.     Balance Overall balance assessment: Needs assistance Sitting-balance support: No upper extremity supported;Feet supported Sitting balance-Leahy Scale: Good     Standing balance support: Bilateral upper extremity supported;During functional activity Standing balance-Leahy Scale: Fair Standing balance comment: Able to maintain balance without UE support for static standing tasks                   ADL Overall ADL's : Needs assistance/impaired Eating/Feeding: Set up;Sitting   Grooming: Wash/dry hands;Min guard;Standing   Upper Body Bathing: Set up;Sitting   Lower Body Bathing: Min guard;Sit to/from stand   Upper Body Dressing : Set up;Sitting   Lower Body Dressing: Min guard;Sit to/from stand   Toilet Transfer: Min guard;Ambulation;Regular Toilet;Grab bars;RW;Cueing for safety;Cueing for sequencing   Toileting- Clothing Manipulation and Hygiene: Min guard;Sitting/lateral lean       Functional mobility during ADLs: Min guard;Rolling walker General ADL Comments: Pt with difficulty initiating and sequencing through tasks. Pt required max directional cues and increased time to complete each step of a task.       Vision                     Perception     Praxis      Cognition   Behavior During Therapy: Flat affect Overall Cognitive Status: Impaired/Different from baseline Area of Impairment: Attention;Memory;Following commands;Safety/judgement;Awareness;Problem solving   Current Attention Level: Sustained Memory: Decreased short-term memory  Following Commands: Follows one step commands inconsistently;Follows one step commands with increased time Safety/Judgement: Decreased awareness of safety;Decreased awareness of deficits Awareness: Intellectual Problem Solving: Slow processing;Decreased initiation;Difficulty sequencing;Requires verbal cues General Comments: Family states at [place] has "cognitive challenges", "something like autism", but  lives independently    Extremity/Trunk Assessment               Exercises     Shoulder Instructions       General Comments      Pertinent Vitals/ Pain       Pain Assessment: Faces Faces Pain Scale: Hurts little more Pain Location: stomach ache Pain Descriptors / Indicators: Aching Pain Intervention(s): Limited activity within patient's tolerance;Monitored during session;Repositioned  Home Living                                          Prior Functioning/Environment              Frequency Min 2X/week     Progress Toward Goals  OT Goals(current goals can now be found in the care plan section)  Progress towards OT goals: Progressing toward goals  Acute Rehab OT Goals Patient Stated Goal: to feel better OT Goal Formulation: With patient/family Time For Goal Achievement: 10/06/15 Potential to Achieve Goals: Good ADL Goals Pt Will Perform Upper Body Bathing: with modified independence;sitting Pt Will Perform Lower Body Bathing: with modified independence;sit to/from stand Pt Will Transfer to Toilet: with modified independence;ambulating;regular height toilet Pt Will Perform Toileting - Clothing Manipulation and hygiene: with modified independence;sit to/from stand Additional ADL Goal #1: Complete bed mobility in preparation for ADL with min vc to use target/gaze stabilization to reduce comlaints of dizziness  Plan Discharge plan remains appropriate    Co-evaluation                 End of Session Equipment Utilized During Treatment: Gait belt;Rolling walker   Activity Tolerance Patient tolerated treatment well   Patient Left in chair;with call bell/phone within reach;with chair alarm set   Nurse Communication Mobility status        Time: [id number] OT Time Calculation (min): 37 min  Charges: OT General Charges $OT Visit: 1 Procedure OT Treatments $Self Care/Home Management : 23-37 mins  Redmond Baseman, OTR/L Pager:  [phone] 09/25/2015, 9:37 AM

## 2015-09-25 NOTE — Consult Note (Signed)
CARDIOLOGY CONSULT NOTE       Patient ID: Tammy Beard MRN: [id number] DOB/AGE: 11/26/1940 75 y.o.  Admit date: 09/21/2015 Referring Physician:  Grandville Silos Primary Physician: [name], MD Primary Cardiologist:  Aundra Dubin Reason for Consultation:  Tachycardia  Principal Problem:   Stroke [facility]) Active Problems:   Dyslipidemia   Essential hypertension   Ulcerative colitis ([place])   Polymyalgia rheumatica (HCC)   Heart murmur   Dizzy   AKI (acute kidney injury) ([place])   UTI (urinary tract infection)   Acute CVA (cerebrovascular accident) ([place])   HPI:  75 y.o. lives at [place] as primary History of hypertension, hyperlipidemia, ulcerative colitis, polio, rheumatic fever and polymyalgia, presented with complaint of dizziness and unstable gait for about 5 days. She has not experienced any focal weakness. She describes dizziness as room spinning. She has known hearing loss which has not changed. Speech has not changed. CT scan of her head showed no acute intracranial abnormality. 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 Also severe intracranial arterial stenosis  Tiny right ACA infarct.  Telemetry review with SR, PaC;s and runs of atrial tachycardia. Asymptomatic to patient. Does not appear to be PAF or flutter. Previously seen by DM for murmur History  Of RF.  Small LVOT gradient reviewed echo from this admission 09/23/15  Study Conclusions  - Left ventricle: The cavity size was normal. Wall thickness was  increased in a pattern of mild LVH. Systolic function was normal.  The estimated ejection fraction was in the range of 50% to 55%.  Features are consistent with a pseudonormal left ventricular  filling pattern, with concomitant abnormal relaxation and  increased filling pressure (grade 2 diastolic dysfunction). - Aortic valve: Mildly calcified annulus. Mildly thickened, mildly  calcified leaflets. There was trivial  regurgitation. - Mitral valve: Mildly calcified annulus. Mildly thickened leaflets     ROS All other systems reviewed and negative except as noted above  Past Medical History  Diagnosis Date  . Ulcerative colitis   . Osteopenia     s/p 7 yrs Fosamax  . Hypertension   . Hyperlipidemia   . Sleep disorder   . GERD (gastroesophageal reflux disease)   . Heart murmur   . Polio   . Colitis   . Rheumatic fever   . Polymyalgia ([place])     Family History  Problem Relation Age of Onset  . Diabetes Paternal Grandmother   . Cancer Paternal Aunt     unaware of primary site  . Other Mother     Coad/ Bronchiectosis  . Coronary artery disease Neg Hx   . Colon cancer Neg Hx   . Rectal cancer Neg Hx   . Stomach cancer Neg Hx   . Pancreatic cancer Other     Social History   Social History  . Marital Status: Single    Spouse Name: N/A  . Number of Children: N/A  . Years of Education: N/A   Occupational History  . retired    Social History Main Topics  . Smoking status: Never Smoker   . Smokeless tobacco: Never Used  . Alcohol Use: Yes     Comment: occasional  . Drug Use: No  . Sexual Activity: Not on file   Other Topics Concern  . Not on file   Social History Narrative   Daily Caffeine use- soda (diet)   Patient gets regular exercise         Diet: None  Do you drink/ eat things with caffeine? Yes, sometimes      Marital status:    Never                           What year were you married ?      Do you live in a house, apartment,assistred living, condo, trailer, etc.)? [employer] [address].      Is it one or more stories? Multilevel       How many persons live in your home ?  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    No                          Type & how often:      Do you have a living will? Yes      Do you have a DNR form?                       If not, do you  want to discuss one?       Do you have signed POA?HPOA forms?                 If so, please bring to your        appointment          Past Surgical History  Procedure Laterality Date  . Breast lumpectomy  1986    left; benign  . Neck surgery  1986    Benign growth on neck removed  . Dilation and curettage of uterus  12/1999  . Colonoscopy  2006  . Tonsillectomy    . Melanoma excision  2007    left arm  . Cataract extraction      Left eye   . Colonosopy  07/05/13    Dr. Deatra Ina     . amLODipine  5 mg Oral Daily  . aspirin  300 mg Rectal Daily   Or  . aspirin  325 mg Oral Daily  . atorvastatin  10 mg Oral q1800  . calcium-vitamin D  1 tablet Oral Daily  . cefUROXime  500 mg Oral BID WC  . clopidogrel  75 mg Oral Daily  . famotidine  10 mg Oral Daily  . insulin aspart  0-15 Units Subcutaneous TID WC  . mesalamine  2.4 g Oral Q breakfast  . multivitamin with minerals  1 tablet Oral Daily  . predniSONE  10 mg Oral Daily      Physical Exam: Blood pressure 166/89, pulse 96, temperature 97.9 F (36.6 C), temperature source Oral, resp. rate 16, height 5\' 1"  (1.549 m), weight 121 lb 14.6 oz (55.3 kg), SpO2 97 %.    Affect appropriate Healthy:  appears stated age 75: normal Neck supple with no adenopathy JVP normal no bruits no thyromegaly Lungs clear with no wheezing and good diaphragmatic motion Heart:  S1/S2 3/6 SEM  murmur, no rub, gallop or click PMI normal Abdomen: benighn, BS positve, no tenderness, no AAA no bruit.  No HSM or HJR Distal pulses intact with no bruits No edema Neuro non-focal Skin warm and dry No muscular weakness   Labs:   Lab Results  Component Value Date   WBC 4.2 09/24/2015  HGB 11.2* 09/24/2015   HCT 36.1 09/24/2015   MCV 93.0 09/24/2015   PLT 127* 09/24/2015    Recent Labs Lab 09/25/15 0531  NA 135  K 3.7  CL 99*  CO2 27  BUN 13  CREATININE 1.09*  CALCIUM 9.3  GLUCOSE 120*   No results found for: CKTOTAL, CKMB,  CKMBINDEX, TROPONINI Lab Results  Component Value Date   CHOL 180 08/23/2015   CHOL 191 11/14/2008   CHOL 200 02/05/2006   Lab Results  Component Value Date   HDL 73* 08/23/2015   HDL 60.40 11/14/2008   HDL 69.0 02/05/2006   Lab Results  Component Value Date   LDLCALC 63 08/23/2015   LDLCALC 105* 11/14/2008   LDLCALC 108* 02/05/2006   Lab Results  Component Value Date   TRIG 222* 08/23/2015   TRIG 126.0 11/14/2008   TRIG 115 02/05/2006   Lab Results  Component Value Date   CHOLHDL 3 11/14/2008   CHOLHDL 2.9 CALC 02/05/2006   No results found for: LDLDIRECT    Radiology: Ct Angio Head W Or Wo Contrast  09/24/2015  CLINICAL DATA:  75 year old female with recent dizziness, nausea vomiting. Questionable punctate infarct in the posterior aspect of the right anterior circulation territory on recent brain MRI. Initial encounter. EXAM: CT ANGIOGRAPHY HEAD AND NECK TECHNIQUE: Multidetector CT imaging of the head and neck was performed using the standard protocol during bolus administration of intravenous contrast. Multiplanar CT image reconstructions and MIPs were obtained to evaluate the vascular anatomy. Carotid stenosis measurements (when applicable) are obtained utilizing NASCET criteria, using the distal internal carotid diameter as the denominator. CONTRAST:  50 mL Isovue 370 COMPARISON:  Intracranial MRA 09/22/2015. Brain MRI 09/21/2015 and noncontrast head CT 09/21/2015 FINDINGS: CT HEAD Brain: Stable gray-white matter differentiation throughout the brain. No cortically based acute infarct identified. No acute intracranial hemorrhage identified. No midline shift, mass effect, or evidence of intracranial mass lesion. Stable ventricle size and configuration. Calvarium and skull base: Stable. No acute osseous abnormality identified. Paranasal sinuses: Visualized paranasal sinuses and mastoids are stable and well pneumatized . Orbits: Stable. CTA NECK Skeleton: No acute osseous abnormality  identified. Chronic fusion of the left lateral first and second ribs. Mild thoracic kyphoscoliosis. Other neck: Small layering left pleural effusion. Apical scarring greater on the right with consolidation and some bronchiectasis. No superior mediastinal lymphadenopathy. Negative thyroid, larynx, pharynx, parapharyngeal spaces, retropharyngeal space, sublingual space, submandibular glands and parotid glands. No cervical lymphadenopathy. Aortic arch: 3 vessel arch configuration with minimal arch atherosclerosis and no great vessel origin stenosis. Right carotid system: Negative.  Tortuous cervical right ICA. Left carotid system: Tortuosity. Minimal atherosclerosis at the left ICA origin and bulb with no stenosis. Vertebral arteries:No proximal subclavian stenosis. Normal right vertebral artery origin. Tortuous but otherwise normal left vertebral artery origin. No vertebral stenosis in the neck. CTA HEAD Posterior circulation: Normal distal vertebral arteries. Normal PICA origins and vertebrobasilar junction. No basilar stenosis. Normal SCA and PCA origins. Posterior communicating arteries are diminutive or absent. Bilateral PCA branches are normal. Anterior circulation: Both ICA siphons remain patent. However, there is focal very high-grade noncalcified stenosis of the distal left ICA just proximal to the terminus as seen on series 604, image 88 and series 602, image 25. There is superimposed calcified ICA siphon plaque, and on the right there is mild to moderate stenosis at the right ICA genu. There is a mild poststenotic dilatation of the left ICA terminus. The left MCA and ACA origins remain patent.  The ACA A1 segments appear fairly codominant. Diminutive or absent anterior communicating artery. Bilateral ACA branches are within normal limits. Left MCA M1 segment is patent but mildly irregular throughout. There does appear to be a left mid M1 mild stenosis. The left MCA branches then are within normal limits. The  right MCA origin and proximal M1 segment are normal, but there is severe stenosis in the distal M1 and at the bifurcation (series 607, image 44). Subsequently, the right MCA M2 and distal branches appeared attenuated diffusely compared to those on the left (series 606, image 8). Venous sinuses: Patent. Anatomic variants: None. Delayed phase: No abnormal enhancement identified. IMPRESSION: 1. Negative for emergent large vessel occlusion but positive for SEVERE intracranial arterial stenoses: - Left ICA terminus (CRITICAL stenosis - see series 604 image 88), - Right MCA distal M1 and bifurcation (SEVERE stenoses) with attenuated Right MCA branches. 2. Atherosclerosis with mild to moderate stenosis of the right ICA siphon and left MCA M1 segment. 3. No extracranial arterial stenosis. Tortuous cervical carotid and vertebral arteries. 4. Stable CT appearance of the brain. 5. Small layering left pleural effusion. Apical pulmonary scarring greater on the right. Electronically Signed   By: Genevie Ann M.D.   On: 09/24/2015 12:50   Dg Chest 2 View  09/21/2015  CLINICAL DATA:  Fall. Left rib and chest pain and tenderness. Initial encounter. EXAM: CHEST  2 VIEW COMPARISON:  05/17/2014 FINDINGS: The heart size and mediastinal contours are within normal limits. Aortic atherosclerosis noted. Changes of COPD are again demonstrated. No evidence of pulmonary infiltrate or edema. No evidence of pneumothorax or pleural effusion. Both lungs are clear. The visualized skeletal structures are unremarkable. IMPRESSION: COPD.  No evidence of pneumothorax or other acute findings. Aortic atherosclerosis noted. Electronically Signed   By: Earle Gell M.D.   On: 09/21/2015 17:43   Ct Head Wo Contrast  09/21/2015  CLINICAL DATA:  Struck in head with a door, no loss of consciousness, stepped down afterwards and later fell, dizziness for past week, history hypertension, ulcer colitis, melanoma EXAM: CT HEAD WITHOUT CONTRAST TECHNIQUE: Contiguous  axial images were obtained from the base of the skull through the vertex without intravenous contrast. 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 Ventricles otherwise normal morphology. No midline shift or mass effect. Otherwise normal appearance of brain parenchyma. No intracranial hemorrhage, mass lesion or evidence acute infarction. No extra-axial fluid collections. Visualized paranasal sinuses and mastoid air cells clear. Atherosclerotic calcifications at the carotid siphons. Bones appear intact. IMPRESSION: No acute intracranial abnormalities. Generalized atrophy. Mega cisterna magna. Electronically Signed   By: Lavonia Dana M.D.   On: 09/21/2015 17:48   Ct Angio Neck W Or Wo Contrast  09/24/2015  CLINICAL DATA:  75 year old female with recent dizziness, nausea vomiting. Questionable punctate infarct in the posterior aspect of the right anterior circulation territory on recent brain MRI. Initial encounter. EXAM: CT ANGIOGRAPHY HEAD AND NECK TECHNIQUE: Multidetector CT imaging of the head and neck was performed using the standard protocol during bolus administration of intravenous contrast. Multiplanar CT image reconstructions and MIPs were obtained to evaluate the vascular anatomy. Carotid stenosis measurements (when applicable) are obtained utilizing NASCET criteria, using the distal internal carotid diameter as the denominator. CONTRAST:  50 mL Isovue 370 COMPARISON:  Intracranial MRA 09/22/2015. Brain MRI 09/21/2015 and noncontrast head CT 09/21/2015 FINDINGS: CT HEAD Brain: Stable gray-white matter differentiation throughout the brain. No cortically based acute infarct identified. No acute intracranial hemorrhage  identified. No midline shift, mass effect, or evidence of intracranial mass lesion. Stable ventricle size and configuration. Calvarium and skull base: Stable. No acute osseous abnormality identified. Paranasal sinuses: Visualized paranasal sinuses  and mastoids are stable and well pneumatized . Orbits: Stable. CTA NECK Skeleton: No acute osseous abnormality identified. Chronic fusion of the left lateral first and second ribs. Mild thoracic kyphoscoliosis. Other neck: Small layering left pleural effusion. Apical scarring greater on the right with consolidation and some bronchiectasis. No superior mediastinal lymphadenopathy. Negative thyroid, larynx, pharynx, parapharyngeal spaces, retropharyngeal space, sublingual space, submandibular glands and parotid glands. No cervical lymphadenopathy. Aortic arch: 3 vessel arch configuration with minimal arch atherosclerosis and no great vessel origin stenosis. Right carotid system: Negative.  Tortuous cervical right ICA. Left carotid system: Tortuosity. Minimal atherosclerosis at the left ICA origin and bulb with no stenosis. Vertebral arteries:No proximal subclavian stenosis. Normal right vertebral artery origin. Tortuous but otherwise normal left vertebral artery origin. No vertebral stenosis in the neck. CTA HEAD Posterior circulation: Normal distal vertebral arteries. Normal PICA origins and vertebrobasilar junction. No basilar stenosis. Normal SCA and PCA origins. Posterior communicating arteries are diminutive or absent. Bilateral PCA branches are normal. Anterior circulation: Both ICA siphons remain patent. However, there is focal very high-grade noncalcified stenosis of the distal left ICA just proximal to the terminus as seen on series 604, image 88 and series 602, image 25. There is superimposed calcified ICA siphon plaque, and on the right there is mild to moderate stenosis at the right ICA genu. There is a mild poststenotic dilatation of the left ICA terminus. The left MCA and ACA origins remain patent. The ACA A1 segments appear fairly codominant. Diminutive or absent anterior communicating artery. Bilateral ACA branches are within normal limits. Left MCA M1 segment is patent but mildly irregular throughout.  There does appear to be a left mid M1 mild stenosis. The left MCA branches then are within normal limits. The right MCA origin and proximal M1 segment are normal, but there is severe stenosis in the distal M1 and at the bifurcation (series 607, image 44). Subsequently, the right MCA M2 and distal branches appeared attenuated diffusely compared to those on the left (series 606, image 8). Venous sinuses: Patent. Anatomic variants: None. Delayed phase: No abnormal enhancement identified. IMPRESSION: 1. Negative for emergent large vessel occlusion but positive for SEVERE intracranial arterial stenoses: - Left ICA terminus (CRITICAL stenosis - see series 604 image 88), - Right MCA distal M1 and bifurcation (SEVERE stenoses) with attenuated Right MCA branches. 2. Atherosclerosis with mild to moderate stenosis of the right ICA siphon and left MCA M1 segment. 3. No extracranial arterial stenosis. Tortuous cervical carotid and vertebral arteries. 4. Stable CT appearance of the brain. 5. Small layering left pleural effusion. Apical pulmonary scarring greater on the right. Electronically Signed   By: Genevie Ann M.D.   On: 09/24/2015 12:50   Mr Brain Wo Contrast  09/21/2015  CLINICAL DATA:  3 year hypertensive female hit in head by door. Sat down afterwards and fell. Dizziness for past week. Subsequent encounter. EXAM: MRI HEAD WITHOUT CONTRAST TECHNIQUE: Multiplanar, multiecho pulse sequences of the brain and surrounding structures were obtained without intravenous contrast. COMPARISON:  09/21/2015 head CT.  No comparison brain MR. FINDINGS: Patient was not able to complete the examination. Sequences obtained are motion degraded. Question tiny acute infarct medial aspect posterior right frontal -parietal lobe junction. Remote infarct superior vermis. No intracranial hemorrhage. Mild chronic microvascular changes. Moderate global atrophy without hydrocephalus.  No intracranial mass lesion noted on this unenhanced  exam. Mega cisterna magna incidentally noted. Major intracranial vascular structures are patent. Post lens replacement without acute orbital abnormality. Partial opacification inferior mastoid air cells bilaterally without obstructing lesion noted. Minimal mucosal thickening ethmoid sinus air cells. IMPRESSION: Patient was not able to complete the examination. Sequences obtained are motion degraded. Question tiny acute infarct medial aspect posterior right frontal -parietal lobe junction. Remote infarct superior vermis. No intracranial hemorrhage. Mild chronic microvascular changes. Moderate global atrophy without hydrocephalus. Electronically Signed   By: Genia Del M.D.   On: 09/21/2015 19:50   Mr Jodene Nam Head/brain Wo Cm  09/22/2015  CLINICAL DATA:  Initial evaluation for the acute stroke. EXAM: MRA HEAD WITHOUT CONTRAST TECHNIQUE: Angiographic images of the Circle of Willis were obtained using MRA technique without intravenous contrast. COMPARISON:  Prior MRI from 09/21/2015. FINDINGS: ANTERIOR CIRCULATION: Study is severely limited by motion artifact. Distal cervical segments of the internal carotid arteries are patent with antegrade flow. Petrous segments grossly patent. Cavernous and supraclinoid segments patent without obvious high-grade stenosis, although evaluation is fairly limited. A1 segments are grossly patent. The left A1 segment appears to be hypoplastic. The anterior cerebral arteries are grossly patent to their mid- distal aspect. M1 segments patent without occlusion. Evaluation for focal severe stenosis is fairly limited on this exam. MCA bifurcations not well evaluated. Distal MCA branches not well evaluated. POSTERIOR CIRCULATION: Vertebral arteries grossly patent to the vertebrobasilar junction. Posterior inferior cerebral arteries not well evaluated. Basilar patent to its distal aspect. Superior cerebral arteries are grossly patent proximally. The posterior cerebral arteries both arise in the  basilar artery and are well opacified to their distal aspects. The right PCA is somewhat attenuated distally as compared to the left. No obvious aneurysm or vascular malformation. IMPRESSION: Severely limited study due to motion artifact. No large or proximal arterial branch occlusion. No definite high-grade or correctable stenosis, although evaluation is fairly limited. Electronically Signed   By: Jeannine Boga M.D.   On: 09/22/2015 05:10    EKG:  SR Sinus arrhythmia LVH    ASSESSMENT AND PLAN:  Atrial Tachycardia:  Add beta blocker continue to monitor on telemetry. Asymptomatic to patient. Do not think this is PAF/Flutter or would necessitate change in anticoagulation Murmur:  Long standing no LVOT gradient AV sclerosis on echo CVA:  Continue rehab dizziness and ambulation much better no focal deficits ASA/Plavix  Signed: Jenkins Rouge 09/25/2015, 9:57 AM

## 2015-09-25 NOTE — Progress Notes (Signed)
PROGRESS NOTE    Tammy Beard  [id number] DOB: 05/12/1940 DOA: 09/21/2015 PCP: [name], MD   Brief Narrative:   Tammy Beard is a 75 y.o. female with medical history significant of hypertension, hyperlipidemia, GERD, ulcerative colitis, osteoporosis, heart murmur, melanoma, polio, polymyalgia, rheumatoid arthritis, poor hearing, who presents with dizziness, nausea and vomiting.  Patient has been having dizziness and poor balance for 5 days. Patient has a feeling that the room is spinning around her. She also has nausea and vomited 3 times on Tuesday, but no vomiting today. Patient does not have diarrhea or abdominal pain. She does not have unilateral weakness, numbness or tingling sensations. She has poor hearing, which is chronic issue, no vision change.   Pt states that she was answering door, when it opened and hit her on her head today. Pt sat down afterwards and later fell. No LOC, or neck/back pain. Patient does not have chest pain, shortness of breath, cough, symptoms of UTI. Per report, EMS found pt was slightly orthostatic (systolic BP dropped from [id number] to 106). EMS gave 430ml NS and zofran for nausea. Pt was given prescription dimenhydrinate by PCP. She states that she did not get this medication until today, but states that she got it from her friend and took it without help.  ED Course: pt was found to have WBC 13.6, temperature normal, transient bradycardia, AKI with cre 1.24 and BUN 35, pending UA and urine culture. CT head is negative for acute intracranial abnormalities. MRI of brain has motion degradation, but showed questionable tiny acute infarct medial aspect posterior right frontal,-parietal lobe junction; remote infarct superior vermis. Pt is placed on tele bed for obs. Neuro was consulted.  Patient was admitted underwent stroke workup neurology and followed patient throughout the hospitalization. Patient was placed under antiplatelet therapy with aspirin and  the Plavix. Patient was assessed by PT were recommended skilled nursing facility. Patient was subsequently noted to go into atrial tachycardia with heart rate in the 150s and a such cardiology consulted. Patient started on a beta blocker.   Assessment & Plan:   Principal Problem:   Stroke [facility]) Active Problems:   Dyslipidemia   Essential hypertension   Ulcerative colitis ([place])   Polymyalgia rheumatica ([facility])   Heart murmur   Dizzy   AKI (acute kidney injury) ([place])   UTI (urinary tract infection)   Acute CVA (cerebrovascular accident) ([place])   Atrial tachycardia ([place])  #1 acute medial right frontoparietal small infarction Patient ambulated without any dizziness however noted to be tachycardic. 2-D echo with EF of 50-55% with grade 2 diastolic dysfunction. No source of cardiac emboli. Mild LVH. CT angiogram of head and neck with severe intracranial stenosis bilateral ICA terminus, B/L MCA M1 and bifurcation with attenuated right MCA branches. Lower extremity Dopplers negative for DVT. Fasting lipid panel with LDL of 63 which is at goal. PT/OT. Risk factor modification. Continue aspirin and Plavix for secondary stroke prevention. Continue dual antiplatelet therapy for 3 months and then Plavix daily. Patient will likely need a 30 day cardiac event monitor at discharge per neurology recommendations. Neurology also recommended low dose Lipitor due to severe intracranial stenosis. Neurology following.  #2 Atrial tachycardia Patient noted to be tachycardic overnight in the 140s to the 150s per nursing and also this morning while patient worked with PT. Patient asymptomatic. Cardiology was consulted who does not feel this is paroxysmal atrial fibrillation or flutter. Beta blocker has been added to patient's regimen. Appreciate cardiology input  and recommendations.  #3 probable UTI Urine cultures with multiple species. Continue oral Ceftin to complete a course of antibiotic treatment.   #4  dizziness versus vertigo Likely secondary to poor oral intake. Patient clinically dry on examination earlier on during the hospitalization. Patient currently is hydrated. No further dizziness.Marland Kitchen PT/OT pending. MRI of the head with a small right frontoparietal infarction. IV fluids. Supportive care.  #5 dehydration Currently euvolemic. NSL IV fluids.  #6 acute kidney injury Likely secondary to prerenal azotemia in the setting of ACE inhibitor diuretics. Renal function improving. Diuretics and ACE inhibitor on hold. Follow.  #7 ulcerative colitis Stable. Continue mesalamine. Continue prednisone.  #8 polymyalgia rheumatica Continue steroids.  #9 hypertension BP meds on hold secondary to acute kidney injury and dehydration. Norvasc was started yesterday. Metoprolol added to regimen secondary to tachycardia. Follow.    DVT prophylaxis: Lovenox Code Status: DO NOT RESUSCITATE Family Communication: Updated patient and brother at bedside. Disposition Plan: SNF once heart rate is better controlled.   Consultants:   Neurology: Dr. Nicole Kindred 09/22/2015  Cardiology: Dr. Johnsie Cancel 09/25/2015  Procedures:   CT Head  09/21/2015 chest x-ray 09/21/2015 MRI/MRA head 09/21/2015, 09/22/2015 2-D echo 09/23/2015 CT angiogram head and neck 09/24/2015  Antimicrobials:   IV Rocephin 09/22/2015>>>> 09/24/2015  Oral Ceftin 09/24/2015   Subjective: Patient denies any chest. No shortness of breath. Patient denies any further nausea or emesis. Patient states dizziness improved. Tolerating current diet. Per nursing patient with SVT with heart rates in the 140s to 150s over night and this morning.  Objective: Filed Vitals:   09/25/15 0431 09/25/15 0900 09/25/15 1048 09/25/15 1345  BP: 162/89 166/89 159/131 142/56  Pulse: 99 96 84 121  Temp: 98.2 F (36.8 C) 97.9 F (36.6 C)  97.4 F (36.3 C)  TempSrc: Oral Oral    Resp: 16   20  Height:      Weight:      SpO2: 99% 97%  99%     Intake/Output Summary (Last 24 hours) at 09/25/15 1600 Last data filed at 09/25/15 1425  Gross per 24 hour  Intake    560 ml  Output    900 ml  Net   -340 ml   Filed Weights   09/22/15 0119  Weight: 55.3 kg (121 lb 14.6 oz)    Examination:  General exam: Appears calm and comfortable. Respiratory system: Clear to auscultation anterior lung fields. Respiratory effort normal. Cardiovascular system: 3/6 SEM. No JVD, murmurs, rubs, gallops or clicks. No pedal edema. Gastrointestinal system: Abdomen is nondistended, soft and nontender. No organomegaly or masses felt. Normal bowel sounds heard. Central nervous system: Alert and oriented. No focal neurological deficits. Extremities: Symmetric 5 x 5 power. Skin: No rashes, lesions or ulcers Psychiatry: Judgement and insight appear normal. Mood & affect appropriate.     Data Reviewed: I have personally reviewed following labs and imaging studies  CBC:  Recent Labs Lab 09/21/15 1554 09/22/15 0832 09/24/15 0539  WBC 13.6* 8.7 4.2  NEUTROABS  --  7.7  --   HGB 12.4 11.8* 11.2*  HCT 37.7 37.4 36.1  MCV 88.9 91.2 93.0  PLT 216 178 [id number]   Basic Metabolic Panel:  Recent Labs Lab 09/21/15 1554 09/22/15 0832 09/23/15 0412 09/24/15 0539 09/25/15 0531 09/25/15 1221  NA 130* 133* 135 136 135  --   K 3.7 3.7 3.5 3.6 3.7  --   CL 94* 97* 102 103 99*  --   CO2 27 25 27 28 27   --  GLUCOSE 304* 123* 91 97 120*  --   BUN 35* 19 15 11 13   --   CREATININE 1.24* 1.07* 1.05* 1.05* 1.09*  --   CALCIUM 8.5* 8.5* 8.3* 8.8* 9.3  --   MG  --  2.1  --   --   --  2.2   GFR: Estimated Creatinine Clearance: 33.7 mL/min (by C-G formula based on Cr of 1.09). Liver Function Tests: No results for input(s): AST, ALT, ALKPHOS, BILITOT, PROT, ALBUMIN in the last 168 hours. No results for input(s): LIPASE, AMYLASE in the last 168 hours. No results for input(s): AMMONIA in the last 168 hours. Coagulation Profile:  Recent Labs Lab  09/21/15 2131  INR 1.07   Cardiac Enzymes: No results for input(s): CKTOTAL, CKMB, CKMBINDEX, TROPONINI in the last 168 hours. BNP (last 3 results) No results for input(s): PROBNP in the last 8760 hours. HbA1C: No results for input(s): HGBA1C in the last 72 hours. CBG:  Recent Labs Lab 09/24/15 1125 09/24/15 1622 09/24/15 2129 09/25/15 0735 09/25/15 1138  GLUCAP 189* 217* 147* 123* 167*   Lipid Profile: No results for input(s): CHOL, HDL, LDLCALC, TRIG, CHOLHDL, LDLDIRECT in the last 72 hours. Thyroid Function Tests:  Recent Labs  09/25/15 0922  TSH 2.008   Anemia Panel: No results for input(s): VITAMINB12, FOLATE, FERRITIN, TIBC, IRON, RETICCTPCT in the last 72 hours. Sepsis Labs: No results for input(s): PROCALCITON, LATICACIDVEN in the last 168 hours.  Recent Results (from the past 240 hour(s))  MRSA PCR Screening     Status: None   Collection Time: 09/22/15  1:30 AM  Result Value Ref Range Status   MRSA by PCR NEGATIVE NEGATIVE Final    Comment:        The GeneXpert MRSA Assay (FDA approved for NASAL specimens only), is one component of a comprehensive MRSA colonization surveillance program. It is not intended to diagnose MRSA infection nor to guide or monitor treatment for MRSA infections.   Urine culture     Status: Abnormal   Collection Time: 09/22/15 10:37 PM  Result Value Ref Range Status   Specimen Description URINE, RANDOM  Final   Special Requests NONE  Final   Culture MULTIPLE SPECIES PRESENT, SUGGEST RECOLLECTION (A)  Final   Report Status 09/24/2015 FINAL  Final         Radiology Studies: Ct Angio Head W Or Wo Contrast  09/24/2015  CLINICAL DATA:  75 year old female with recent dizziness, nausea vomiting. Questionable punctate infarct in the posterior aspect of the right anterior circulation territory on recent brain MRI. Initial encounter. EXAM: CT ANGIOGRAPHY HEAD AND NECK TECHNIQUE: Multidetector CT imaging of the head and neck was  performed using the standard protocol during bolus administration of intravenous contrast. Multiplanar CT image reconstructions and MIPs were obtained to evaluate the vascular anatomy. Carotid stenosis measurements (when applicable) are obtained utilizing NASCET criteria, using the distal internal carotid diameter as the denominator. CONTRAST:  50 mL Isovue 370 COMPARISON:  Intracranial MRA 09/22/2015. Brain MRI 09/21/2015 and noncontrast head CT 09/21/2015 FINDINGS: CT HEAD Brain: Stable gray-white matter differentiation throughout the brain. No cortically based acute infarct identified. No acute intracranial hemorrhage identified. No midline shift, mass effect, or evidence of intracranial mass lesion. Stable ventricle size and configuration. Calvarium and skull base: Stable. No acute osseous abnormality identified. Paranasal sinuses: Visualized paranasal sinuses and mastoids are stable and well pneumatized . Orbits: Stable. CTA NECK Skeleton: No acute osseous abnormality identified. Chronic fusion of the left lateral first  and second ribs. Mild thoracic kyphoscoliosis. Other neck: Small layering left pleural effusion. Apical scarring greater on the right with consolidation and some bronchiectasis. No superior mediastinal lymphadenopathy. Negative thyroid, larynx, pharynx, parapharyngeal spaces, retropharyngeal space, sublingual space, submandibular glands and parotid glands. No cervical lymphadenopathy. Aortic arch: 3 vessel arch configuration with minimal arch atherosclerosis and no great vessel origin stenosis. Right carotid system: Negative.  Tortuous cervical right ICA. Left carotid system: Tortuosity. Minimal atherosclerosis at the left ICA origin and bulb with no stenosis. Vertebral arteries:No proximal subclavian stenosis. Normal right vertebral artery origin. Tortuous but otherwise normal left vertebral artery origin. No vertebral stenosis in the neck. CTA HEAD Posterior circulation: Normal distal  vertebral arteries. Normal PICA origins and vertebrobasilar junction. No basilar stenosis. Normal SCA and PCA origins. Posterior communicating arteries are diminutive or absent. Bilateral PCA branches are normal. Anterior circulation: Both ICA siphons remain patent. However, there is focal very high-grade noncalcified stenosis of the distal left ICA just proximal to the terminus as seen on series 604, image 88 and series 602, image 25. There is superimposed calcified ICA siphon plaque, and on the right there is mild to moderate stenosis at the right ICA genu. There is a mild poststenotic dilatation of the left ICA terminus. The left MCA and ACA origins remain patent. The ACA A1 segments appear fairly codominant. Diminutive or absent anterior communicating artery. Bilateral ACA branches are within normal limits. Left MCA M1 segment is patent but mildly irregular throughout. There does appear to be a left mid M1 mild stenosis. The left MCA branches then are within normal limits. The right MCA origin and proximal M1 segment are normal, but there is severe stenosis in the distal M1 and at the bifurcation (series 607, image 44). Subsequently, the right MCA M2 and distal branches appeared attenuated diffusely compared to those on the left (series 606, image 8). Venous sinuses: Patent. Anatomic variants: None. Delayed phase: No abnormal enhancement identified. IMPRESSION: 1. Negative for emergent large vessel occlusion but positive for SEVERE intracranial arterial stenoses: - Left ICA terminus (CRITICAL stenosis - see series 604 image 88), - Right MCA distal M1 and bifurcation (SEVERE stenoses) with attenuated Right MCA branches. 2. Atherosclerosis with mild to moderate stenosis of the right ICA siphon and left MCA M1 segment. 3. No extracranial arterial stenosis. Tortuous cervical carotid and vertebral arteries. 4. Stable CT appearance of the brain. 5. Small layering left pleural effusion. Apical pulmonary scarring  greater on the right. Electronically Signed   By: Genevie Ann M.D.   On: 09/24/2015 12:50   Ct Angio Neck W Or Wo Contrast  09/24/2015  CLINICAL DATA:  75 year old female with recent dizziness, nausea vomiting. Questionable punctate infarct in the posterior aspect of the right anterior circulation territory on recent brain MRI. Initial encounter. EXAM: CT ANGIOGRAPHY HEAD AND NECK TECHNIQUE: Multidetector CT imaging of the head and neck was performed using the standard protocol during bolus administration of intravenous contrast. Multiplanar CT image reconstructions and MIPs were obtained to evaluate the vascular anatomy. Carotid stenosis measurements (when applicable) are obtained utilizing NASCET criteria, using the distal internal carotid diameter as the denominator. CONTRAST:  50 mL Isovue 370 COMPARISON:  Intracranial MRA 09/22/2015. Brain MRI 09/21/2015 and noncontrast head CT 09/21/2015 FINDINGS: CT HEAD Brain: Stable gray-white matter differentiation throughout the brain. No cortically based acute infarct identified. No acute intracranial hemorrhage identified. No midline shift, mass effect, or evidence of intracranial mass lesion. Stable ventricle size and configuration. Calvarium and skull base: Stable.  No acute osseous abnormality identified. Paranasal sinuses: Visualized paranasal sinuses and mastoids are stable and well pneumatized . Orbits: Stable. CTA NECK Skeleton: No acute osseous abnormality identified. Chronic fusion of the left lateral first and second ribs. Mild thoracic kyphoscoliosis. Other neck: Small layering left pleural effusion. Apical scarring greater on the right with consolidation and some bronchiectasis. No superior mediastinal lymphadenopathy. Negative thyroid, larynx, pharynx, parapharyngeal spaces, retropharyngeal space, sublingual space, submandibular glands and parotid glands. No cervical lymphadenopathy. Aortic arch: 3 vessel arch configuration with minimal arch atherosclerosis  and no great vessel origin stenosis. Right carotid system: Negative.  Tortuous cervical right ICA. Left carotid system: Tortuosity. Minimal atherosclerosis at the left ICA origin and bulb with no stenosis. Vertebral arteries:No proximal subclavian stenosis. Normal right vertebral artery origin. Tortuous but otherwise normal left vertebral artery origin. No vertebral stenosis in the neck. CTA HEAD Posterior circulation: Normal distal vertebral arteries. Normal PICA origins and vertebrobasilar junction. No basilar stenosis. Normal SCA and PCA origins. Posterior communicating arteries are diminutive or absent. Bilateral PCA branches are normal. Anterior circulation: Both ICA siphons remain patent. However, there is focal very high-grade noncalcified stenosis of the distal left ICA just proximal to the terminus as seen on series 604, image 88 and series 602, image 25. There is superimposed calcified ICA siphon plaque, and on the right there is mild to moderate stenosis at the right ICA genu. There is a mild poststenotic dilatation of the left ICA terminus. The left MCA and ACA origins remain patent. The ACA A1 segments appear fairly codominant. Diminutive or absent anterior communicating artery. Bilateral ACA branches are within normal limits. Left MCA M1 segment is patent but mildly irregular throughout. There does appear to be a left mid M1 mild stenosis. The left MCA branches then are within normal limits. The right MCA origin and proximal M1 segment are normal, but there is severe stenosis in the distal M1 and at the bifurcation (series 607, image 44). Subsequently, the right MCA M2 and distal branches appeared attenuated diffusely compared to those on the left (series 606, image 8). Venous sinuses: Patent. Anatomic variants: None. Delayed phase: No abnormal enhancement identified. IMPRESSION: 1. Negative for emergent large vessel occlusion but positive for SEVERE intracranial arterial stenoses: - Left ICA terminus  (CRITICAL stenosis - see series 604 image 88), - Right MCA distal M1 and bifurcation (SEVERE stenoses) with attenuated Right MCA branches. 2. Atherosclerosis with mild to moderate stenosis of the right ICA siphon and left MCA M1 segment. 3. No extracranial arterial stenosis. Tortuous cervical carotid and vertebral arteries. 4. Stable CT appearance of the brain. 5. Small layering left pleural effusion. Apical pulmonary scarring greater on the right. Electronically Signed   By: Genevie Ann M.D.   On: 09/24/2015 12:50        Scheduled Meds: . amLODipine  5 mg Oral Daily  . aspirin  300 mg Rectal Daily   Or  . aspirin  325 mg Oral Daily  . atorvastatin  10 mg Oral q1800  . calcium-vitamin D  1 tablet Oral Daily  . cefUROXime  500 mg Oral BID WC  . clopidogrel  75 mg Oral Daily  . famotidine  10 mg Oral Daily  . insulin aspart  0-15 Units Subcutaneous TID WC  . mesalamine  2.4 g Oral Q breakfast  . metoprolol tartrate  25 mg Oral BID  . multivitamin with minerals  1 tablet Oral Daily  . predniSONE  10 mg Oral Daily   Continuous Infusions:  LOS: 3 days    Time spent: 80 minutes    THOMPSON,DANIEL, MD [facility] Pager [phone]  If 7PM-7AM, please contact night-coverage [email or web address] Password [email or web address] 09/25/2015, 4:00 PM

## 2015-09-26 DIAGNOSIS — Z7982 Long term (current) use of aspirin: Secondary | ICD-10-CM | POA: Diagnosis not present

## 2015-09-26 DIAGNOSIS — M353 Polymyalgia rheumatica: Secondary | ICD-10-CM | POA: Diagnosis not present

## 2015-09-26 DIAGNOSIS — I10 Essential (primary) hypertension: Secondary | ICD-10-CM | POA: Diagnosis not present

## 2015-09-26 DIAGNOSIS — N179 Acute kidney failure, unspecified: Secondary | ICD-10-CM | POA: Diagnosis not present

## 2015-09-26 DIAGNOSIS — I63521 Cerebral infarction due to unspecified occlusion or stenosis of right anterior cerebral artery: Secondary | ICD-10-CM | POA: Diagnosis not present

## 2015-09-26 DIAGNOSIS — I639 Cerebral infarction, unspecified: Secondary | ICD-10-CM | POA: Diagnosis not present

## 2015-09-26 DIAGNOSIS — R2681 Unsteadiness on feet: Secondary | ICD-10-CM | POA: Diagnosis not present

## 2015-09-26 DIAGNOSIS — H1789 Other corneal scars and opacities: Secondary | ICD-10-CM | POA: Diagnosis not present

## 2015-09-26 DIAGNOSIS — Z09 Encounter for follow-up examination after completed treatment for conditions other than malignant neoplasm: Secondary | ICD-10-CM | POA: Diagnosis not present

## 2015-09-26 DIAGNOSIS — N390 Urinary tract infection, site not specified: Secondary | ICD-10-CM | POA: Diagnosis not present

## 2015-09-26 DIAGNOSIS — I63321 Cerebral infarction due to thrombosis of right anterior cerebral artery: Secondary | ICD-10-CM

## 2015-09-26 DIAGNOSIS — A498 Other bacterial infections of unspecified site: Secondary | ICD-10-CM | POA: Diagnosis not present

## 2015-09-26 DIAGNOSIS — R404 Transient alteration of awareness: Secondary | ICD-10-CM | POA: Diagnosis not present

## 2015-09-26 DIAGNOSIS — R531 Weakness: Secondary | ICD-10-CM | POA: Diagnosis not present

## 2015-09-26 DIAGNOSIS — E785 Hyperlipidemia, unspecified: Secondary | ICD-10-CM | POA: Diagnosis not present

## 2015-09-26 DIAGNOSIS — I4891 Unspecified atrial fibrillation: Secondary | ICD-10-CM | POA: Diagnosis not present

## 2015-09-26 DIAGNOSIS — R011 Cardiac murmur, unspecified: Secondary | ICD-10-CM | POA: Diagnosis not present

## 2015-09-26 DIAGNOSIS — H04123 Dry eye syndrome of bilateral lacrimal glands: Secondary | ICD-10-CM | POA: Diagnosis not present

## 2015-09-26 DIAGNOSIS — R42 Dizziness and giddiness: Secondary | ICD-10-CM | POA: Diagnosis not present

## 2015-09-26 DIAGNOSIS — Z7902 Long term (current) use of antithrombotics/antiplatelets: Secondary | ICD-10-CM | POA: Diagnosis not present

## 2015-09-26 DIAGNOSIS — I471 Supraventricular tachycardia: Secondary | ICD-10-CM | POA: Diagnosis not present

## 2015-09-26 DIAGNOSIS — R7303 Prediabetes: Secondary | ICD-10-CM | POA: Diagnosis not present

## 2015-09-26 DIAGNOSIS — N3000 Acute cystitis without hematuria: Secondary | ICD-10-CM | POA: Diagnosis not present

## 2015-09-26 DIAGNOSIS — R002 Palpitations: Secondary | ICD-10-CM | POA: Diagnosis not present

## 2015-09-26 DIAGNOSIS — R1312 Dysphagia, oropharyngeal phase: Secondary | ICD-10-CM | POA: Diagnosis not present

## 2015-09-26 DIAGNOSIS — R350 Frequency of micturition: Secondary | ICD-10-CM | POA: Diagnosis not present

## 2015-09-26 DIAGNOSIS — M25511 Pain in right shoulder: Secondary | ICD-10-CM | POA: Diagnosis not present

## 2015-09-26 DIAGNOSIS — H5203 Hypermetropia, bilateral: Secondary | ICD-10-CM | POA: Diagnosis not present

## 2015-09-26 DIAGNOSIS — M6281 Muscle weakness (generalized): Secondary | ICD-10-CM | POA: Diagnosis not present

## 2015-09-26 DIAGNOSIS — R41841 Cognitive communication deficit: Secondary | ICD-10-CM | POA: Diagnosis not present

## 2015-09-26 DIAGNOSIS — H16103 Unspecified superficial keratitis, bilateral: Secondary | ICD-10-CM | POA: Diagnosis not present

## 2015-09-26 LAB — BASIC METABOLIC PANEL
ANION GAP: 9 (ref 5–15)
BUN: 20 mg/dL (ref 6–20)
CO2: 30 mmol/L (ref 22–32)
Calcium: 9.3 mg/dL (ref 8.9–10.3)
Chloride: 95 mmol/L — ABNORMAL LOW (ref 101–111)
Creatinine, Ser: 1.12 mg/dL — ABNORMAL HIGH (ref 0.44–1.00)
GFR calc Af Amer: 54 mL/min — ABNORMAL LOW (ref >60)
GFR, EST NON AFRICAN AMERICAN: 47 mL/min — AB (ref >60)
GLUCOSE: 94 mg/dL (ref 65–99)
POTASSIUM: 4.3 mmol/L (ref 3.5–5.1)
Sodium: 134 mmol/L — ABNORMAL LOW (ref 135–145)

## 2015-09-26 LAB — MAGNESIUM: Magnesium: 2.3 mg/dL (ref 1.7–2.4)

## 2015-09-26 MED ORDER — SENNOSIDES-DOCUSATE SODIUM 8.6-50 MG PO TABS: 1.0000 | ORAL_TABLET | Freq: Every evening | ORAL | Status: DC | PRN

## 2015-09-26 MED ORDER — AMLODIPINE BESYLATE 5 MG PO TABS: 5.0000 mg | ORAL_TABLET | Freq: Every day | ORAL | Status: DC

## 2015-09-26 MED ORDER — CEFUROXIME AXETIL 500 MG PO TABS: 500.0000 mg | ORAL_TABLET | Freq: Two times a day (BID) | ORAL | Status: DC

## 2015-09-26 MED ORDER — METOPROLOL TARTRATE 25 MG PO TABS: 25.0000 mg | ORAL_TABLET | Freq: Two times a day (BID) | ORAL | Status: DC

## 2015-09-26 MED ORDER — ASPIRIN 325 MG PO TABS: 325.0000 mg | ORAL_TABLET | Freq: Every day | ORAL | Status: DC

## 2015-09-26 MED ORDER — ATORVASTATIN CALCIUM 10 MG PO TABS: 10.0000 mg | ORAL_TABLET | Freq: Every day | ORAL | Status: DC

## 2015-09-26 MED ORDER — CLOPIDOGREL BISULFATE 75 MG PO TABS
75.0000 mg | ORAL_TABLET | Freq: Every day | ORAL | Status: DC
Start: 2015-09-26 — End: 2015-12-20

## 2015-09-26 NOTE — Progress Notes (Signed)
Patient will DC to: [facility] Anticipated DC date: 09/26/15 Family notified: Brother at [employer] by: Brother will transport by car   Per MD patient ready for DC to [facility]. RN, patient, patient's family, and facility notified of DC. RN given number for report [phone]).  CSW signing off.  Cedric Fishman, [place] Social Worker [phone]

## 2015-09-26 NOTE — Telephone Encounter (Signed)
Patient subsequently went to the hospital and was admitted on 09/21/2015.

## 2015-09-26 NOTE — Progress Notes (Signed)
SUBJECTIVE: The patient is doing well today.  At this time, she denies chest pain, shortness of breath, or any new concerns.  CURRENT MEDICATIONS: . amLODipine  5 mg Oral Daily  . aspirin  300 mg Rectal Daily   Or  . aspirin  325 mg Oral Daily  . atorvastatin  10 mg Oral q1800  . calcium-vitamin D  1 tablet Oral Daily  . cefUROXime  500 mg Oral BID WC  . clopidogrel  75 mg Oral Daily  . famotidine  10 mg Oral Daily  . insulin aspart  0-15 Units Subcutaneous TID WC  . mesalamine  2.4 g Oral Q breakfast  . metoprolol tartrate  25 mg Oral BID  . multivitamin with minerals  1 tablet Oral Daily  . predniSONE  10 mg Oral Daily      OBJECTIVE: Physical Exam: Filed Vitals:   09/25/15 2114 09/25/15 2124 09/26/15 0550 09/26/15 0604  BP: 158/72   160/81  Pulse: 58 85  78  Temp: 97.9 F (36.6 C)  97.8 F (36.6 C)   TempSrc: Oral  Oral   Resp:    18  Height:      Weight:      SpO2: 96%   100%    Intake/Output Summary (Last 24 hours) at 09/26/15 0749 Last data filed at 09/26/15 0550  Gross per 24 hour  Intake    680 ml  Output    900 ml  Net   -220 ml    Telemetry reveals sinus rhythm with PACs, runs of AT  GEN- The patient is elderly appearing, alert and oriented x 3 today.   Head- normocephalic, atraumatic Eyes-  Sclera clear, conjunctiva pink Ears- hearing intact Oropharynx- clear Neck- supple  Lungs- Clear to ausculation bilaterally, normal work of breathing Heart- Regular rate and rhythm with frequent ectopy  GI- soft, NT, ND, + BS Extremities- no clubbing, cyanosis, or edema Skin- no rash or lesion Psych- euthymic mood, full affect Neuro- strength and sensation are intact  LABS: Basic Metabolic Panel:  Recent Labs  09/25/15 0531 09/25/15 1221 09/26/15 0548  NA 135  --  134*  K 3.7  --  4.3  CL 99*  --  95*  CO2 27  --  30  GLUCOSE 120*  --  94  BUN 13  --  20  CREATININE 1.09*  --  1.12*  CALCIUM 9.3  --  9.3  MG  --  2.2 2.3    CBC:  Recent Labs  09/24/15 0539 09/25/15 1221  WBC 4.2 9.8  HGB 11.2* 12.4  HCT 36.1 39.0  MCV 93.0 91.3  PLT 127* 148*   Thyroid Function Tests:  Recent Labs  09/25/15 0922  TSH 2.008    RADIOLOGY: Ct Angio Head W Or Wo Contrast 09/24/2015  CLINICAL DATA:  75 year old female with recent dizziness, nausea vomiting. Questionable punctate infarct in the posterior aspect of the right anterior circulation territory on recent brain MRI. Initial encounter. EXAM: CT ANGIOGRAPHY HEAD AND NECK TECHNIQUE: Multidetector CT imaging of the head and neck was performed using the standard protocol during bolus administration of intravenous contrast. Multiplanar CT image reconstructions and MIPs were obtained to evaluate the vascular anatomy. Carotid stenosis measurements (when applicable) are obtained utilizing NASCET criteria, using the distal internal carotid diameter as the denominator. CONTRAST:  50 mL Isovue 370 COMPARISON:  Intracranial MRA 09/22/2015. Brain MRI 09/21/2015 and noncontrast head CT 09/21/2015 FINDINGS: CT HEAD Brain: Stable gray-white matter differentiation throughout  the brain. No cortically based acute infarct identified. No acute intracranial hemorrhage identified. No midline shift, mass effect, or evidence of intracranial mass lesion. Stable ventricle size and configuration. Calvarium and skull base: Stable. No acute osseous abnormality identified. Paranasal sinuses: Visualized paranasal sinuses and mastoids are stable and well pneumatized . Orbits: Stable. CTA NECK Skeleton: No acute osseous abnormality identified. Chronic fusion of the left lateral first and second ribs. Mild thoracic kyphoscoliosis. Other neck: Small layering left pleural effusion. Apical scarring greater on the right with consolidation and some bronchiectasis. No superior mediastinal lymphadenopathy. Negative thyroid, larynx, pharynx, parapharyngeal spaces, retropharyngeal space, sublingual space, submandibular  glands and parotid glands. No cervical lymphadenopathy. Aortic arch: 3 vessel arch configuration with minimal arch atherosclerosis and no great vessel origin stenosis. Right carotid system: Negative.  Tortuous cervical right ICA. Left carotid system: Tortuosity. Minimal atherosclerosis at the left ICA origin and bulb with no stenosis. Vertebral arteries:No proximal subclavian stenosis. Normal right vertebral artery origin. Tortuous but otherwise normal left vertebral artery origin. No vertebral stenosis in the neck. CTA HEAD Posterior circulation: Normal distal vertebral arteries. Normal PICA origins and vertebrobasilar junction. No basilar stenosis. Normal SCA and PCA origins. Posterior communicating arteries are diminutive or absent. Bilateral PCA branches are normal. Anterior circulation: Both ICA siphons remain patent. However, there is focal very high-grade noncalcified stenosis of the distal left ICA just proximal to the terminus as seen on series 604, image 88 and series 602, image 25. There is superimposed calcified ICA siphon plaque, and on the right there is mild to moderate stenosis at the right ICA genu. There is a mild poststenotic dilatation of the left ICA terminus. The left MCA and ACA origins remain patent. The ACA A1 segments appear fairly codominant. Diminutive or absent anterior communicating artery. Bilateral ACA branches are within normal limits. Left MCA M1 segment is patent but mildly irregular throughout. There does appear to be a left mid M1 mild stenosis. The left MCA branches then are within normal limits. The right MCA origin and proximal M1 segment are normal, but there is severe stenosis in the distal M1 and at the bifurcation (series 607, image 44). Subsequently, the right MCA M2 and distal branches appeared attenuated diffusely compared to those on the left (series 606, image 8). Venous sinuses: Patent. Anatomic variants: None. Delayed phase: No abnormal enhancement identified.  IMPRESSION: 1. Negative for emergent large vessel occlusion but positive for SEVERE intracranial arterial stenoses: - Left ICA terminus (CRITICAL stenosis - see series 604 image 88), - Right MCA distal M1 and bifurcation (SEVERE stenoses) with attenuated Right MCA branches. 2. Atherosclerosis with mild to moderate stenosis of the right ICA siphon and left MCA M1 segment. 3. No extracranial arterial stenosis. Tortuous cervical carotid and vertebral arteries. 4. Stable CT appearance of the brain. 5. Small layering left pleural effusion. Apical pulmonary scarring greater on the right. Electronically Signed   By: Genevie Ann M.D.   On: 09/24/2015 12:50   ASSESSMENT AND PLAN:  Principal Problem:   Stroke [facility]) Active Problems:   Dyslipidemia   Essential hypertension   Ulcerative colitis ([place])   Polymyalgia rheumatica (HCC)   Heart murmur   Dizzy   AKI (acute kidney injury) ([place])   UTI (urinary tract infection)   Acute CVA (cerebrovascular accident) ([place])   Atrial tachycardia ([place])  1.  Atrial tachycardia Asymptomatic  No atrial fibrillation identified With frequency of atrial ectopy, I think the likelihood of her having AF is high and agree with 30  day outpatient monitor Would up-titrate BB when ok with neurology for rate control  2.   Stroke 2/2 intracranial stenosis but cannot rule out embolic source per neurology 30 day monitor as above Management per neurology  Electrophysiology team to see as needed while here. Please call with questions.  Chanetta Marshall, NP 09/26/2015 7:52 AM     I have seen and examined this patient with Chanetta Marshall.  Agree with above, note added to reflect my findings.  On exam, regular rhythm, no murmurs, lungs clear.  Had CVA with history of intracrainal stenosis.  Also has episodes of asymptomatic atrial tachycardia.  At this time, agree with up-titration of beta blocker for treatment of AT.  Agree with 30 day monitor as the risks of AF are high with her burden of  atrial tachycardia.      Creig Landin M. Cherissa Hook MD 09/26/2015 8:04 AM

## 2015-09-26 NOTE — Discharge Summary (Signed)
Physician Discharge Summary   Patient ID: Tammy Beard MRN: [id number] DOB/AGE: October 03, 1940 75 y.o.  Admit date: 09/21/2015 Discharge date: 09/26/2015  Primary Care Physician:  [name], MD  Discharge Diagnoses:    . Acute CVA (cerebrovascular accident) ([place]) . Atrial tachycardia  . Dyslipidemia . Essential hypertension . Ulcerative colitis ([place]) . Polymyalgia rheumatica ([place]) . Heart murmur . AKI (acute kidney injury) ([place]) . UTI (urinary tract infection)   Consults:   Cardiology Neurology  Recommendations for Outpatient Follow-up:  1. Per neurology, continue aspirin and Plavix for 3 months then continue Plavix daily 2. Needs 30 day cardiac event monitor, request is sent to [facility] coordinator, Ms Liona Nimmer   3. Please repeat CBC/BMET at next visit 4. Patient needs follow-up appointment with neurology and cardiology of patient's   DIET: Heart healthy diet    Allergies:  No Known Allergies   DISCHARGE MEDICATIONS: Current Discharge Medication List    START taking these medications   Details  amLODipine (NORVASC) 5 MG tablet Take 1 tablet (5 mg total) by mouth daily.    aspirin 325 MG tablet Take 1 tablet (325 mg total) by mouth daily. Cont ASA and plavix for 3 months, then plavix daily    atorvastatin (LIPITOR) 10 MG tablet Take 1 tablet (10 mg total) by mouth daily at 6 PM.    cefUROXime (CEFTIN) 500 MG tablet Take 1 tablet (500 mg total) by mouth 2 (two) times daily with a meal. X 5days    clopidogrel (PLAVIX) 75 MG tablet Take 1 tablet (75 mg total) by mouth daily.    metoprolol tartrate (LOPRESSOR) 25 MG tablet Take 1 tablet (25 mg total) by mouth 2 (two) times daily.    senna-docusate (SENOKOT-S) 8.6-50 MG tablet Take 1 tablet by mouth at bedtime as needed for mild constipation.      CONTINUE these medications which have NOT CHANGED   Details  acetaminophen (TYLENOL) 325 MG tablet Take 650 mg by mouth every 6 (six) hours as needed.     benazepril-hydrochlorthiazide (LOTENSIN HCT) 20-12.5 MG tablet Take 0.5 tablets by mouth daily.    Calcium Carbonate-Vitamin D (CALCIUM + D PO) Take 1 tablet by mouth daily.     dimenhyDRINATE (DRAMAMINE) 50 MG tablet Take 1 tablet every 6 hours as needed for nausea or dizziness Qty: 24 tablet, Refills: 0    fluticasone (FLONASE) 50 MCG/ACT nasal spray Place 1 spray into both nostrils daily as needed for allergies or rhinitis.    mesalamine (LIALDA) 1.2 g EC tablet take 2 tablets by mouth daily with BREAKFAST Qty: 60 tablet, Refills: 6    Multiple Vitamin (MULTIVITAMIN) tablet Take 1 tablet by mouth daily.      predniSONE (DELTASONE) 5 MG tablet TWO TABLETs DAILY TO TREAT POLYMYALGIA RHEUMATICA Qty: 180 tablet, Refills: 2   Associated Diagnoses: Polymyalgia rheumatica (HCC)    ranitidine (ZANTAC) 150 MG tablet One twice daily to reduce stomach acid Qty: 180 tablet, Refills: 3         Brief H and P: For complete details please refer to admission H and P, but in brief Tammy Beard is a 75 y.o. female with medical history significant of hypertension, hyperlipidemia, GERD, ulcerative colitis, osteoporosis, heart murmur, melanoma, polio, polymyalgia, rheumatoid arthritis, poor hearing, who presented with dizziness, nausea and vomiting. Patient was having dizziness and poor balance for 5 days, feeling that the room is spinning around her associated with nausea and vomiting.  She did not have unilateral weakness,  numbness or tingling sensations. EMS found pt was slightly orthostatic (systolic BP dropped from [id number] to 106).  In ED: pt was found to have WBC 13.6, temperature normal, transient bradycardia, AKI with cre 1.24 and BUN 35, pending UA and urine culture. CT head negative for acute intracranial abnormalities. MRI of brain has motion degradation, but showed questionable tiny acute infarct medial aspect posterior right frontal,-parietal lobe junction; remote infarct superior vermis.   Patient was admitted underwent stroke workup neurology and followed patient throughout the hospitalization. Patient was placed on dual antiplatelet therapy with aspirin and the Plavix. Patient was assessed by PT were recommended skilled nursing facility. Patient was subsequently noted to go into atrial tachycardia with heart rate in the 150s and as such cardiology was consulted. Patient started on a beta blocker.  Hospital Course:  Acute medial right frontoparietal small infarction MRI of the brain showed questionable tiny acute infarct medial aspect of posterior right frontal parietal lobe junction. Patient underwent full stroke workup.  2-D echo with EF of 50-55% with grade 2 diastolic dysfunction. No source of cardiac emboli. Mild LVH.  CT angiogram of head and neck with severe intracranial stenosis bilateral ICA terminus, B/L MCA M1 and bifurcation with attenuated right MCA branches.  Lower extremity Dopplers negative for DVT.  Fasting lipid panel with LDL of 63 which is at goal. PT/OT. Risk factor modification.  Continue aspirin and Plavix for secondary stroke prevention. Continue dual antiplatelet therapy for 3 months and then Plavix daily. Patient will likely need a 30 day cardiac event monitor at discharge per neurology recommendations. Request sent to [facility] coordinator.  Neurology also recommended low dose Lipitor due to severe intracranial stenosis.   Atrial tachycardia Patient was noted to be tachycardic on 7/3 overnight and on 7/4 am with HR in the 140s to the 150s. Patient was however not symptomatic. Cardiology was consulted who does not feel this is paroxysmal atrial fibrillation or flutter. Beta blocker has been added to patient's regimen.   probable UTI Urine cultures with multiple species. Continue oral Ceftin to complete a course of antibiotic treatment.   dizziness versus vertigo Likely secondary to poor oral intake.  Patient currently is hydrated. No further dizziness.Marland Kitchen PT/OT  recommended skilled nursing facility MRI of the head with a small right frontoparietal infarction. Supportive care.   dehydration Currently euvolemic.    acute kidney injury Likely secondary to prerenal azotemia in the setting of ACE inhibitor diuretics. Renal function improving. Diuretics and ACE inhibitor on hold. Follow.   ulcerative colitis Stable. Continue mesalamine. Continue prednisone.   polymyalgia rheumatica Continue steroids.   hypertension BP meds were on hold secondary to acute kidney injury and dehydration. Continue Norvasc and metoprolol. Add antihypertensives for better BP control as secondary prevention.   Day of Discharge BP 160/81 mmHg  Pulse 78  Temp(Src) 97.8 F (36.6 C) (Oral)  Resp 18  Ht 5\' 1"  (1.549 m)  Wt 55.3 kg (121 lb 14.6 oz)  BMI 23.05 kg/m2  SpO2 100%  Physical Exam: General: Alert and awake oriented x3 not in any acute distress. HEENT: anicteric sclera, pupils reactive to light and accommodation CVS: S1-S2 clear, 3/6 SEM Chest: clear to auscultation bilaterally, no wheezing rales or rhonchi Abdomen: soft nontender, nondistended, normal bowel sounds Extremities: no cyanosis, clubbing or edema noted bilaterally Neuro: Cranial nerves II-XII intact, no focal neurological deficits   The results of significant diagnostics from this hospitalization (including imaging, microbiology, ancillary and laboratory) are listed below for reference.  LAB RESULTS: Basic Metabolic Panel:  Recent Labs Lab 09/25/15 0531  09/26/15 0548  NA 135  --  134*  K 3.7  --  4.3  CL 99*  --  95*  CO2 27  --  30  GLUCOSE 120*  --  94  BUN 13  --  20  CREATININE 1.09*  --  1.12*  CALCIUM 9.3  --  9.3  MG  --   < > 2.3  < > = values in this interval not displayed. Liver Function Tests: No results for input(s): AST, ALT, ALKPHOS, BILITOT, PROT, ALBUMIN in the last 168 hours. No results for input(s): LIPASE, AMYLASE in the last 168 hours. No results for  input(s): AMMONIA in the last 168 hours. CBC:  Recent Labs Lab 09/22/15 0832 09/24/15 0539 09/25/15 1221  WBC 8.7 4.2 9.8  NEUTROABS 7.7  --   --   HGB 11.8* 11.2* 12.4  HCT 37.4 36.1 39.0  MCV 91.2 93.0 91.3  PLT 178 127* 148*   Cardiac Enzymes: No results for input(s): CKTOTAL, CKMB, CKMBINDEX, TROPONINI in the last 168 hours. BNP: Invalid input(s): POCBNP CBG:  Recent Labs Lab 09/25/15 1732 09/25/15 2115  GLUCAP 121* 123*    Significant Diagnostic Studies:  Dg Chest 2 View  09/21/2015  CLINICAL DATA:  Fall. Left rib and chest pain and tenderness. Initial encounter. EXAM: CHEST  2 VIEW COMPARISON:  05/17/2014 FINDINGS: The heart size and mediastinal contours are within normal limits. Aortic atherosclerosis noted. Changes of COPD are again demonstrated. No evidence of pulmonary infiltrate or edema. No evidence of pneumothorax or pleural effusion. Both lungs are clear. The visualized skeletal structures are unremarkable. IMPRESSION: COPD.  No evidence of pneumothorax or other acute findings. Aortic atherosclerosis noted. Electronically Signed   By: Earle Gell M.D.   On: 09/21/2015 17:43   Ct Head Wo Contrast  09/21/2015  CLINICAL DATA:  Struck in head with a door, no loss of consciousness, stepped down afterwards and later fell, dizziness for past week, history hypertension, ulcer colitis, melanoma EXAM: CT HEAD WITHOUT CONTRAST TECHNIQUE: Contiguous axial images were obtained from the base of the skull through the vertex without intravenous contrast. 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 Ventricles otherwise normal morphology. No midline shift or mass effect. Otherwise normal appearance of brain parenchyma. No intracranial hemorrhage, mass lesion or evidence acute infarction. No extra-axial fluid collections. Visualized paranasal sinuses and mastoid air cells clear. Atherosclerotic calcifications at the carotid siphons. Bones  appear intact. IMPRESSION: No acute intracranial abnormalities. Generalized atrophy. Mega cisterna magna. Electronically Signed   By: Lavonia Dana M.D.   On: 09/21/2015 17:48   Mr Brain Wo Contrast  09/21/2015  CLINICAL DATA:  40 year hypertensive female hit in head by door. Sat down afterwards and fell. Dizziness for past week. Subsequent encounter. EXAM: MRI HEAD WITHOUT CONTRAST TECHNIQUE: Multiplanar, multiecho pulse sequences of the brain and surrounding structures were obtained without intravenous contrast. COMPARISON:  09/21/2015 head CT.  No comparison brain MR. FINDINGS: Patient was not able to complete the examination. Sequences obtained are motion degraded. Question tiny acute infarct medial aspect posterior right frontal -parietal lobe junction. Remote infarct superior vermis. No intracranial hemorrhage. Mild chronic microvascular changes. Moderate global atrophy without hydrocephalus. No intracranial mass lesion noted on this unenhanced exam. Mega cisterna magna incidentally noted. Major intracranial vascular structures are patent. Post lens replacement without acute orbital abnormality. Partial opacification inferior mastoid air cells bilaterally without obstructing  lesion noted. Minimal mucosal thickening ethmoid sinus air cells. IMPRESSION: Patient was not able to complete the examination. Sequences obtained are motion degraded. Question tiny acute infarct medial aspect posterior right frontal -parietal lobe junction. Remote infarct superior vermis. No intracranial hemorrhage. Mild chronic microvascular changes. Moderate global atrophy without hydrocephalus. Electronically Signed   By: Genia Del M.D.   On: 09/21/2015 19:50   Mr Jodene Nam Head/brain Wo Cm  09/22/2015  CLINICAL DATA:  Initial evaluation for the acute stroke. EXAM: MRA HEAD WITHOUT CONTRAST TECHNIQUE: Angiographic images of the Circle of Willis were obtained using MRA technique without intravenous contrast. COMPARISON:   Prior MRI from 09/21/2015. FINDINGS: ANTERIOR CIRCULATION: Study is severely limited by motion artifact. Distal cervical segments of the internal carotid arteries are patent with antegrade flow. Petrous segments grossly patent. Cavernous and supraclinoid segments patent without obvious high-grade stenosis, although evaluation is fairly limited. A1 segments are grossly patent. The left A1 segment appears to be hypoplastic. The anterior cerebral arteries are grossly patent to their mid- distal aspect. M1 segments patent without occlusion. Evaluation for focal severe stenosis is fairly limited on this exam. MCA bifurcations not well evaluated. Distal MCA branches not well evaluated. POSTERIOR CIRCULATION: Vertebral arteries grossly patent to the vertebrobasilar junction. Posterior inferior cerebral arteries not well evaluated. Basilar patent to its distal aspect. Superior cerebral arteries are grossly patent proximally. The posterior cerebral arteries both arise in the basilar artery and are well opacified to their distal aspects. The right PCA is somewhat attenuated distally as compared to the left. No obvious aneurysm or vascular malformation. IMPRESSION: Severely limited study due to motion artifact. No large or proximal arterial branch occlusion. No definite high-grade or correctable stenosis, although evaluation is fairly limited. Electronically Signed   By: Jeannine Boga M.D.   On: 09/22/2015 05:10    2D ECHO: Study Conclusions  - Left ventricle: The cavity size was normal. Wall thickness was  increased in a pattern of mild LVH. Systolic function was normal.  The estimated ejection fraction was in the range of 50% to 55%.  Features are consistent with a pseudonormal left ventricular  filling pattern, with concomitant abnormal relaxation and  increased filling pressure (grade 2 diastolic dysfunction). - Aortic valve: Mildly calcified annulus. Mildly thickened, mildly  calcified leaflets.  There was trivial regurgitation. - Mitral valve: Mildly calcified annulus. Mildly thickened leaflets  Disposition and Follow-up: Discharge Instructions    Diet - low sodium heart healthy    Complete by:  As directed      Increase activity slowly    Complete by:  As directed             DISPOSITION:  SNF   DISCHARGE FOLLOW-UP Follow-up Information    Follow up with GREEN, Viviann Spare, MD. Schedule an appointment as soon as possible for a visit in 10 days.   Specialty:  Internal Medicine   Why:  for hospital follow-up   Contact information:   [place] [address] [phone]       Follow up with Xu,Jindong, MD In 4 weeks.   Specialty:  Neurology   Why:  for hospital follow-up/ stroke    Contact information:   [address] [place] [address] [phone]       Follow up with Jenkins Rouge, MD. Schedule an appointment as soon as possible for a visit in 2 weeks.   Specialty:  Cardiology   Why:  for hospital follow-up   Contact information:  [place]. [address] [place] [address] [phone]        Time spent on Discharge: 62mins   Signed:   RAI,RIPUDEEP M.D. [facility] 09/26/2015, 10:32 AM Pager: [phone]

## 2015-09-26 NOTE — Plan of Care (Signed)
Problem: Health Behavior/Discharge Planning: Goal: Ability to manage health-related needs will improve Outcome: Not Progressing Pt has not had a BM in several days and refuses and meds to help relieve that--states "it will come out when it is ready"

## 2015-09-26 NOTE — Care Management Note (Signed)
Case Management Note  Patient Details  Name: Tammy Beard MRN: [id number] Date of Birth: 10-05-1940  Subjective/Objective:                    Action/Plan: Plan is to d/c to SNF today.  Expected Discharge Date:   09/26/2015            Expected Discharge Plan:  [place] ([place])  In-House Referral:  Clinical Social Work  Discharge planning Services  CM Consult Status of Service:  Completed, signed off   Sharin Mons, [place] 09/26/2015, 10:48 AM

## 2015-09-27 ENCOUNTER — Encounter: Payer: Self-pay | Admitting: Internal Medicine

## 2015-09-27 ENCOUNTER — Non-Acute Institutional Stay (SKILLED_NURSING_FACILITY): Payer: Medicare Other | Admitting: Internal Medicine

## 2015-09-27 ENCOUNTER — Other Ambulatory Visit: Payer: Self-pay | Admitting: Nurse Practitioner

## 2015-09-27 DIAGNOSIS — R42 Dizziness and giddiness: Secondary | ICD-10-CM | POA: Diagnosis not present

## 2015-09-27 DIAGNOSIS — I10 Essential (primary) hypertension: Secondary | ICD-10-CM

## 2015-09-27 DIAGNOSIS — R7303 Prediabetes: Secondary | ICD-10-CM | POA: Diagnosis not present

## 2015-09-27 DIAGNOSIS — M353 Polymyalgia rheumatica: Secondary | ICD-10-CM | POA: Diagnosis not present

## 2015-09-27 DIAGNOSIS — I63521 Cerebral infarction due to unspecified occlusion or stenosis of right anterior cerebral artery: Secondary | ICD-10-CM | POA: Diagnosis not present

## 2015-09-27 DIAGNOSIS — I471 Supraventricular tachycardia: Secondary | ICD-10-CM

## 2015-09-27 NOTE — Progress Notes (Signed)
Patient ID: Tammy Beard, female   DOB: 1940/11/30, 75 y.o.   MRN: [id number]   History and Physical     Location:   [facility] Room Number: N31 Place of Service:  SNF (31)  PCP: [name], MD Patient Care Team: [name], MD as PCP - General (Internal Medicine) [name], MD as Consulting Physician (Cardiology) [name], MD as Consulting Physician (Ophthalmology) [name], MD as Consulting Physician (Gastroenterology) [name], MD as Consulting Physician (Dermatology)  Extended Emergency Contact Information Primary Emergency Contact: [name]) Address: [place], [place] [address] [name] of [place] Phone: [phone] Mobile Phone: [phone] Relation: Brother Secondary Emergency Contact: [name] [place] of [place] Phone: [phone] Relation: Sister  Code Status: DNR Goals of Care: Advanced Directive information Advanced Directives 09/27/2015  Does patient have an advance directive? Yes  Type of Paramedic of [place];Living will  Copy of advanced directive(s) in chart? Yes      Chief Complaint  Patient presents with  . New Admit To SNF    following hospitalization 09/21/15 to 09/26/15 acute CVA    HPI: Patient is a 75 y.o. female seen today for admission to [facility] SNF on 09/26/15 following hospitalization From 09/21/2015 through 09/26/2015 for an acute CVA. Patient was sent to the hospital with dizziness, nausea, and vomiting. She said that she had poor balance for 5 days and felt that the room is spinning around. This was associated with the nausea and vomiting. In the emergency room she was found to have an elevated at 13,600. Vital signs were normal. BUN was slightly elevated at 35 and creatinine 1.2. CT of the head appear negative for acute intracranial abnormalities and the MRI of the brain and motion degradation but showed a possible  tiny acute infarct of the medial aspect of the posterior right frontal parietal lobe junction. There was a remote infarct at the superior vermis.  With the above findings the patient was admitted to the hospital and seen by neurology. She was placed on dual antiplatelet therapy with aspirin and Plavix. He was noted that she went an atrial tachycardia with a heart rate in the 150s. Cardiology was consulted and she was started on a beta blocker. Echocardiogram showed a grade 2 diastolic dysfunction but was otherwise normal.  CT angiogram of the head and neck showed severe intracranial stenoses of both ICA.  Elevation of BNP and creatinine noted on admission improved throughout hospital stay and was felt likely related to prerenal azotemia in the setting of ACE inhibitor and diuretics.  Ulcerative colitis was stable; she was continued on her measle amine and prednisone.  Patient has previous diagnosis of polymyalgia rheumatica which appeared to be stable during this hospitalization. She was continued on her steroids for this.  Previous blood pressure medications were held except for Norvasc and metoprolol.  Patient is admitted to the SNF for rehabilitative(. She'll be enrolled with physical therapy and occupational therapy. Gait is quite unstable and she feels weak area previous nausea problems have cleared up. Patient is visually impaired, but this existed prior to her recent stroke.  Past Medical History  Diagnosis Date  . Ulcerative colitis   . Osteopenia     s/p 7 yrs Fosamax  . Hypertension   . Hyperlipidemia   . Sleep disorder   . GERD (gastroesophageal reflux disease)   . Heart murmur   .  Polio   . Colitis   . Rheumatic fever   . Polymyalgia [facility])    Past Surgical History  Procedure Laterality Date  . Breast lumpectomy  1986    left; benign  . Neck surgery  1986    Benign growth on neck removed  . Dilation and curettage of uterus  12/1999  . Colonoscopy  2006  .  Tonsillectomy    . Melanoma excision  2007    left arm  . Cataract extraction      Left eye   . Colonosopy  07/05/13    Dr. Deatra Ina    reports that she has never smoked. She has never used smokeless tobacco. She reports that she drinks alcohol. She reports that she does not use illicit drugs. Social History   Social History  . Marital Status: Single    Spouse Name: N/A  . Number of Children: N/A  . Years of Education: N/A   Occupational History  . retired    Social History Main Topics  . Smoking status: Never Smoker   . Smokeless tobacco: Never Used  . Alcohol Use: Yes     Comment: occasional  . Drug Use: No  . Sexual Activity: Not on file   Other Topics Concern  . Not on file   Social History Narrative   Daily Caffeine use- soda (diet)   Patient gets regular exercise         Diet: None      Do you drink/ eat things with caffeine? Yes, sometimes      Marital status:    Never                            Lives at [facility], transfer to skill unit 09/26/15         How many persons live in your home ?  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    No                               Do you have a living will? Yes      Do you have a DNR form?                       If not, do you want to discuss one?       Do you have signed POA?HPOA forms?                 If so, please bring to your        appointment          Functional Status Survey:    Family History  Problem Relation Age of Onset  . Diabetes Paternal Grandmother   . Cancer Paternal Aunt     unaware of primary site  . Other Mother     Coad/ Bronchiectosis  . Coronary artery disease Neg Hx   . Colon cancer Neg Hx   . Rectal cancer Neg Hx   . Stomach cancer Neg Hx   . Pancreatic cancer Other     Health Maintenance  Topic Date Due  . ZOSTAVAX  11/19/1999  . MAMMOGRAM  07/18/2015  . INFLUENZA  VACCINE  10/23/2015  . TETANUS/TDAP  12/21/2021  . DEXA SCAN  Completed  . PNA vac Low Risk Adult  Completed    No Known Allergies    Medication List       This list is accurate as of: 09/27/15 12:08 PM.  Always use your most recent med list.               acetaminophen 325 MG tablet  Commonly known as:  TYLENOL  Take 650 mg by mouth every 6 (six) hours as needed.     amLODipine 5 MG tablet  Commonly known as:  NORVASC  Take 1 tablet (5 mg total) by mouth daily.     aspirin 325 MG tablet  Take 1 tablet (325 mg total) by mouth daily. Cont ASA and plavix for 3 months, then plavix daily     atorvastatin 10 MG tablet  Commonly known as:  LIPITOR  Take 1 tablet (10 mg total) by mouth daily at 6 PM.     benazepril-hydrochlorthiazide 20-12.5 MG tablet  Commonly known as:  LOTENSIN HCT  Take 0.5 tablets by mouth daily.     CALCIUM + D PO  Take 1 tablet by mouth daily.     cefUROXime 500 MG tablet  Commonly known as:  CEFTIN  Take 1 tablet (500 mg total) by mouth 2 (two) times daily with a meal. X 5days     clopidogrel 75 MG tablet  Commonly known as:  PLAVIX  Take 1 tablet (75 mg total) by mouth daily.     dimenhyDRINATE 50 MG tablet  Commonly known as:  DRAMAMINE  Take 1 tablet every 6 hours as needed for nausea or dizziness     fluticasone 50 MCG/ACT nasal spray  Commonly known as:  FLONASE  Place 1 spray into both nostrils daily as needed for allergies or rhinitis.     mesalamine 1.2 g EC tablet  Commonly known as:  LIALDA  take 2 tablets by mouth daily with BREAKFAST     metoprolol tartrate 25 MG tablet  Commonly known as:  LOPRESSOR  Take 1 tablet (25 mg total) by mouth 2 (two) times daily.     multivitamin tablet  Take 1 tablet by mouth daily.     predniSONE 5 MG tablet  Commonly known as:  DELTASONE  TWO TABLETs DAILY TO TREAT POLYMYALGIA RHEUMATICA     ranitidine 150 MG tablet  Commonly known as:  ZANTAC  One twice daily to reduce stomach acid      senna-docusate 8.6-50 MG tablet  Commonly known as:  Senokot-S  Take 1 tablet by mouth at bedtime as needed for mild constipation.        Review of Systems  Constitutional: Negative for activity change, appetite change, chills, diaphoresis, fatigue, fever and unexpected weight change.  HENT: Negative for congestion, ear discharge, ear pain, hearing loss, postnasal drip, rhinorrhea, sore throat, tinnitus, trouble swallowing and voice change.   Eyes: Positive for visual disturbance (corrective lenses). Negative for pain, redness and itching.  Respiratory: Negative for cough, choking, shortness of breath and wheezing.   Cardiovascular: Negative for chest pain, palpitations and leg swelling.  Gastrointestinal: Positive for diarrhea, nausea and vomiting. Negative for abdominal distention, abdominal pain and constipation.       Hx ulcerative colitis; on Lialda.  Endocrine: Negative for cold intolerance, heat intolerance, polydipsia, polyphagia and polyuria.       History of elevated glucose.  Genitourinary: Negative for difficulty urinating, dysuria, flank  pain, frequency, hematuria, pelvic pain, urgency and vaginal discharge.  Musculoskeletal: Positive for back pain, gait problem and myalgias. Negative for arthralgias, neck pain and neck stiffness.       Hx PMR and chronic use pf prednisone. History of osteopenia and kyphosis  Skin: Negative for color change, pallor and rash.  Allergic/Immunologic: Negative.   Neurological: Positive for dizziness, light-headedness and headaches. Negative for tremors, seizures, syncope, weakness and numbness.       History of poliomyelitis at age 57  Hematological: Negative for adenopathy. Does not bruise/bleed easily.  Psychiatric/Behavioral: Positive for confusion and decreased concentration. Negative for agitation, behavioral problems, dysphoric mood, hallucinations, sleep disturbance and suicidal ideas. The patient is nervous/anxious. The patient is not  hyperactive.     Filed Vitals:   09/27/15 1204  BP: 168/87  Pulse: 60  Temp: 98.4 F (36.9 C)  Resp: 16  Height: 5\' 1"  (1.549 m)  Weight: 118 lb (53.524 kg)  SpO2: 98%   Body mass index is 22.31 kg/(m^2). Physical Exam  Constitutional: She is oriented to person, place, and time. She appears well-developed and well-nourished. No distress.  HENT:  Right Ear: External ear normal.  Left Ear: External ear normal.  Nose: Nose normal.  Mouth/Throat: Oropharynx is clear and moist. No oropharyngeal exudate.  Wearing hearing aids bilaterally. EAC and TM normal bilaterally.  Eyes: Conjunctivae and EOM are normal. Pupils are equal, round, and reactive to light. No scleral icterus.  Prescription lenses  Neck: No JVD present. No tracheal deviation present. No thyromegaly present.  Cardiovascular: Normal rate, regular rhythm, normal heart sounds and intact distal pulses.  Exam reveals no gallop and no friction rub.   No murmur heard. Pulmonary/Chest: Effort normal. No respiratory distress. She has no wheezes. She has no rales. She exhibits no tenderness.  Abdominal: Soft. Bowel sounds are normal. She exhibits no distension and no mass. There is no tenderness.  Large diastasis recti  Musculoskeletal: Normal range of motion. She exhibits tenderness. She exhibits no edema.  Kyphosis  Lymphadenopathy:    She has no cervical adenopathy.  Neurological: She is alert and oriented to person, place, and time. No cranial nerve deficit. Coordination abnormal.  Mild tremor of extended arms  Skin: No rash noted. She is not diaphoretic. No erythema. No pallor.  Psychiatric: She has a normal mood and affect. Her behavior is normal. Judgment and thought content normal.    Labs reviewed: Basic Metabolic Panel:  Recent Labs  09/22/15 0832  09/24/15 0539 09/25/15 0531 09/25/15 1221 09/26/15 0548  NA 133*  < > 136 135  --  134*  K 3.7  < > 3.6 3.7  --  4.3  CL 97*  < > 103 99*  --  95*  CO2 25  < >  28 27  --  30  GLUCOSE 123*  < > 97 120*  --  94  BUN 19  < > 11 13  --  20  CREATININE 1.07*  < > 1.05* 1.09*  --  1.12*  CALCIUM 8.5*  < > 8.8* 9.3  --  9.3  MG 2.1  --   --   --  2.2 2.3  < > = values in this interval not displayed. Liver Function Tests:  Recent Labs  04/26/15  AST 21  ALT 26  ALKPHOS 105   No results for input(s): LIPASE, AMYLASE in the last 8760 hours. No results for input(s): AMMONIA in the last 8760 hours. CBC:  Recent Labs  09/22/15 0832 09/24/15 0539 09/25/15 1221  WBC 8.7 4.2 9.8  NEUTROABS 7.7  --   --   HGB 11.8* 11.2* 12.4  HCT 37.4 36.1 39.0  MCV 91.2 93.0 91.3  PLT 178 127* 148*   Cardiac Enzymes: No results for input(s): CKTOTAL, CKMB, CKMBINDEX, TROPONINI in the last 8760 hours. BNP: Invalid input(s): POCBNP Lab Results  Component Value Date   HGBA1C 6.0 08/23/2015   Lab Results  Component Value Date   TSH 2.008 09/25/2015   Lab Results  Component Value Date   VITAMINB12 254 05/17/2014   No results found for: FOLATE Lab Results  Component Value Date   IRON 43 12/07/2014    Imaging and Procedures obtained prior to SNF admission: Dg Chest 2 View  09/21/2015  CLINICAL DATA:  Fall. Left rib and chest pain and tenderness. Initial encounter. EXAM: CHEST  2 VIEW COMPARISON:  05/17/2014 FINDINGS: The heart size and mediastinal contours are within normal limits. Aortic atherosclerosis noted. Changes of COPD are again demonstrated. No evidence of pulmonary infiltrate or edema. No evidence of pneumothorax or pleural effusion. Both lungs are clear. The visualized skeletal structures are unremarkable. IMPRESSION: COPD.  No evidence of pneumothorax or other acute findings. Aortic atherosclerosis noted. Electronically Signed   By: Earle Gell M.D.   On: 09/21/2015 17:43   Ct Head Wo Contrast  09/21/2015  CLINICAL DATA:  Struck in head with a door, no loss of consciousness, stepped down afterwards and later fell, dizziness for past week,  history hypertension, ulcer colitis, melanoma EXAM: CT HEAD WITHOUT CONTRAST TECHNIQUE: Contiguous axial images were obtained from the base of the skull through the vertex without intravenous contrast. 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 Ventricles otherwise normal morphology. No midline shift or mass effect. Otherwise normal appearance of brain parenchyma. No intracranial hemorrhage, mass lesion or evidence acute infarction. No extra-axial fluid collections. Visualized paranasal sinuses and mastoid air cells clear. Atherosclerotic calcifications at the carotid siphons. Bones appear intact. IMPRESSION: No acute intracranial abnormalities. Generalized atrophy. Mega cisterna magna. Electronically Signed   By: Lavonia Dana M.D.   On: 09/21/2015 17:48   Mr Brain Wo Contrast  09/21/2015  CLINICAL DATA:  22 year hypertensive female hit in head by door. Sat down afterwards and fell. Dizziness for past week. Subsequent encounter. EXAM: MRI HEAD WITHOUT CONTRAST TECHNIQUE: Multiplanar, multiecho pulse sequences of the brain and surrounding structures were obtained without intravenous contrast. COMPARISON:  09/21/2015 head CT.  No comparison brain MR. FINDINGS: Patient was not able to complete the examination. Sequences obtained are motion degraded. Question tiny acute infarct medial aspect posterior right frontal -parietal lobe junction. Remote infarct superior vermis. No intracranial hemorrhage. Mild chronic microvascular changes. Moderate global atrophy without hydrocephalus. No intracranial mass lesion noted on this unenhanced exam. Mega cisterna magna incidentally noted. Major intracranial vascular structures are patent. Post lens replacement without acute orbital abnormality. Partial opacification inferior mastoid air cells bilaterally without obstructing lesion noted. Minimal mucosal thickening ethmoid sinus air cells. IMPRESSION: Patient was not able  to complete the examination. Sequences obtained are motion degraded. Question tiny acute infarct medial aspect posterior right frontal -parietal lobe junction. Remote infarct superior vermis. No intracranial hemorrhage. Mild chronic microvascular changes. Moderate global atrophy without hydrocephalus. Electronically Signed   By: Genia Del M.D.   On: 09/21/2015 19:50   Mr Jodene Nam Head/brain Wo Cm  09/22/2015  CLINICAL DATA:  Initial evaluation for the acute stroke. EXAM: MRA HEAD  WITHOUT CONTRAST TECHNIQUE: Angiographic images of the Circle of Willis were obtained using MRA technique without intravenous contrast. COMPARISON:  Prior MRI from 09/21/2015. FINDINGS: ANTERIOR CIRCULATION: Study is severely limited by motion artifact. Distal cervical segments of the internal carotid arteries are patent with antegrade flow. Petrous segments grossly patent. Cavernous and supraclinoid segments patent without obvious high-grade stenosis, although evaluation is fairly limited. A1 segments are grossly patent. The left A1 segment appears to be hypoplastic. The anterior cerebral arteries are grossly patent to their mid- distal aspect. M1 segments patent without occlusion. Evaluation for focal severe stenosis is fairly limited on this exam. MCA bifurcations not well evaluated. Distal MCA branches not well evaluated. POSTERIOR CIRCULATION: Vertebral arteries grossly patent to the vertebrobasilar junction. Posterior inferior cerebral arteries not well evaluated. Basilar patent to its distal aspect. Superior cerebral arteries are grossly patent proximally. The posterior cerebral arteries both arise in the basilar artery and are well opacified to their distal aspects. The right PCA is somewhat attenuated distally as compared to the left. No obvious aneurysm or vascular malformation. IMPRESSION: Severely limited study due to motion artifact. No large or proximal arterial branch occlusion. No definite high-grade or correctable  stenosis, although evaluation is fairly limited. Electronically Signed   By: Jeannine Boga M.D.   On: 09/22/2015 05:10    Assessment/Plan 1. 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 She'll be engaged in physical therapy and occupational therapy. We expect her to improve in relation to safe walking, general strength, and self-care skills. It is unclear whether she will improve enough to be able to return safely to her independent living apartment  2. Disequilibrium Hopefully this will just take time. She is currently a fall risk due to her disequilibrium and needs somebody to hold onto her  3. Essential hypertension Controlled  4. Polymyalgia rheumatica (HCC) Controlled on current steroid dosing  5. Pre-diabetes Continue to monitor PERIODICALLY  6. Atrial tachycardia (HCC) Rapid rate in the hospital has returned to a normal rate. She will remain on her beta blocker.

## 2015-09-28 ENCOUNTER — Encounter (HOSPITAL_COMMUNITY): Payer: Self-pay | Admitting: Emergency Medicine

## 2015-09-28 ENCOUNTER — Emergency Department (HOSPITAL_COMMUNITY)
Admission: EM | Admit: 2015-09-28 | Discharge: 2015-09-28 | Disposition: A | Discharge status: Home / Self Care | Payer: Medicare Other | Attending: Emergency Medicine | Admitting: Emergency Medicine

## 2015-09-28 DIAGNOSIS — Z7982 Long term (current) use of aspirin: Secondary | ICD-10-CM | POA: Insufficient documentation

## 2015-09-28 DIAGNOSIS — I10 Essential (primary) hypertension: Secondary | ICD-10-CM | POA: Insufficient documentation

## 2015-09-28 DIAGNOSIS — R42 Dizziness and giddiness: Secondary | ICD-10-CM | POA: Diagnosis not present

## 2015-09-28 DIAGNOSIS — N390 Urinary tract infection, site not specified: Secondary | ICD-10-CM | POA: Diagnosis not present

## 2015-09-28 DIAGNOSIS — Z7902 Long term (current) use of antithrombotics/antiplatelets: Secondary | ICD-10-CM | POA: Insufficient documentation

## 2015-09-28 LAB — URINALYSIS, ROUTINE W REFLEX MICROSCOPIC
Bilirubin Urine: NEGATIVE
Glucose, UA: NEGATIVE mg/dL
Hgb urine dipstick: NEGATIVE
Ketones, ur: NEGATIVE mg/dL
Nitrite: NEGATIVE
Protein, ur: NEGATIVE mg/dL
Specific Gravity, Urine: 1.014 (ref 1.005–1.030)
pH: 6 (ref 5.0–8.0)

## 2015-09-28 LAB — CBC WITH DIFFERENTIAL/PLATELET
Basophils Absolute: 0 10*3/uL (ref 0.0–0.1)
Basophils Relative: 0 %
Eosinophils Absolute: 0 10*3/uL (ref 0.0–0.7)
Eosinophils Relative: 0 %
HCT: 40.3 % (ref 36.0–46.0)
Hemoglobin: 12.7 g/dL (ref 12.0–15.0)
Lymphocytes Relative: 7 %
Lymphs Abs: 0.5 10*3/uL — ABNORMAL LOW (ref 0.7–4.0)
MCH: 29.4 pg (ref 26.0–34.0)
MCHC: 31.5 g/dL (ref 30.0–36.0)
MCV: 93.3 fL (ref 78.0–100.0)
Monocytes Absolute: 0.2 10*3/uL (ref 0.1–1.0)
Monocytes Relative: 2 %
Neutro Abs: 6.9 10*3/uL (ref 1.7–7.7)
Neutrophils Relative %: 91 %
Platelets: 212 10*3/uL (ref 150–400)
RBC: 4.32 MIL/uL (ref 3.87–5.11)
RDW: 13.5 % (ref 11.5–15.5)
WBC: 7.6 10*3/uL (ref 4.0–10.5)

## 2015-09-28 LAB — BASIC METABOLIC PANEL
Anion gap: 8 (ref 5–15)
BUN: 33 mg/dL — ABNORMAL HIGH (ref 6–20)
CO2: 31 mmol/L (ref 22–32)
Calcium: 9.9 mg/dL (ref 8.9–10.3)
Chloride: 93 mmol/L — ABNORMAL LOW (ref 101–111)
Creatinine, Ser: 1.23 mg/dL — ABNORMAL HIGH (ref 0.44–1.00)
GFR calc Af Amer: 48 mL/min — ABNORMAL LOW (ref >60)
GFR calc non Af Amer: 42 mL/min — ABNORMAL LOW (ref >60)
Glucose, Bld: 145 mg/dL — ABNORMAL HIGH (ref 65–99)
Potassium: 4.4 mmol/L (ref 3.5–5.1)
Sodium: 132 mmol/L — ABNORMAL LOW (ref 135–145)

## 2015-09-28 LAB — URINE MICROSCOPIC-ADD ON: RBC / HPF: NONE SEEN RBC/hpf (ref 0–5)

## 2015-09-28 MED ORDER — CEPHALEXIN 250 MG PO CAPS
500.0000 mg | ORAL_CAPSULE | Freq: Once | ORAL | Status: AC
  Administered 2015-09-28: 500 mg via ORAL
  Filled 2015-09-28: qty 2

## 2015-09-28 MED ORDER — SODIUM CHLORIDE 0.9 % IV BOLUS (SEPSIS)
500.0000 mL | Freq: Once | INTRAVENOUS | Status: AC
Start: 2015-09-28 — End: 2015-09-28
  Administered 2015-09-28: 500 mL via INTRAVENOUS

## 2015-09-28 MED ORDER — CEPHALEXIN 500 MG PO CAPS: 500.0000 mg | ORAL_CAPSULE | Freq: Two times a day (BID) | ORAL | Status: DC

## 2015-09-28 MED ORDER — SODIUM CHLORIDE 0.9 % IV BOLUS (SEPSIS)
500.0000 mL | Freq: Once | INTRAVENOUS | Status: AC
  Administered 2015-09-28: 500 mL via INTRAVENOUS

## 2015-09-28 NOTE — Discharge Instructions (Signed)
Medications: Keflex  Treatment: Take Keflex twice daily as prescribed for 1 week for your urinary tract infection. Drink plenty of water, at least 8 glasses of water daily.  Follow-up: Please follow-up with your primary care provider at your scheduled appointment on Tuesday. Also follow up with cardiology and neurology at your scheduled appointments. Please return to the emergency department if you develop any new or worsening symptoms.   Urinary Tract Infection Urinary tract infections (UTIs) can develop anywhere along your urinary tract. Your urinary tract is your body's drainage system for removing wastes and extra water. Your urinary tract includes two kidneys, two ureters, a bladder, and a urethra. Your kidneys are a pair of bean-shaped organs. Each kidney is about the size of your fist. They are located below your ribs, one on each side of your spine. CAUSES Infections are caused by microbes, which are microscopic organisms, including fungi, viruses, and bacteria. These organisms are so small that they can only be seen through a microscope. Bacteria are the microbes that most commonly cause UTIs. SYMPTOMS  Symptoms of UTIs may vary by age and gender of the patient and by the location of the infection. Symptoms in young women typically include a frequent and intense urge to urinate and a painful, burning feeling in the bladder or urethra during urination. Older women and men are more likely to be tired, shaky, and weak and have muscle aches and abdominal pain. A fever may mean the infection is in your kidneys. Other symptoms of a kidney infection include pain in your back or sides below the ribs, nausea, and vomiting. DIAGNOSIS To diagnose a UTI, your caregiver will ask you about your symptoms. Your caregiver will also ask you to provide a urine sample. The urine sample will be tested for bacteria and white blood cells. White blood cells are made by your body to help fight infection. TREATMENT    Typically, UTIs can be treated with medication. Because most UTIs are caused by a bacterial infection, they usually can be treated with the use of antibiotics. The choice of antibiotic and length of treatment depend on your symptoms and the type of bacteria causing your infection. HOME CARE INSTRUCTIONS  If you were prescribed antibiotics, take them exactly as your caregiver instructs you. Finish the medication even if you feel better after you have only taken some of the medication.  Drink enough water and fluids to keep your urine clear or pale yellow.  Avoid caffeine, tea, and carbonated beverages. They tend to irritate your bladder.  Empty your bladder often. Avoid holding urine for long periods of time.  Empty your bladder before and after sexual intercourse.  After a bowel movement, women should cleanse from front to back. Use each tissue only once. SEEK MEDICAL CARE IF:   You have back pain.  You develop a fever.  Your symptoms do not begin to resolve within 3 days. SEEK IMMEDIATE MEDICAL CARE IF:   You have severe back pain or lower abdominal pain.  You develop chills.  You have nausea or vomiting.  You have continued burning or discomfort with urination. MAKE SURE YOU:   Understand these instructions.  Will watch your condition.  Will get help right away if you are not doing well or get worse.   This information is not intended to replace advice given to you by your health care provider. Make sure you discuss any questions you have with your health care provider.   Document Released: 12/18/2004 Document Revised:  11/29/2014 Document Reviewed: 04/18/2011 Elsevier Interactive Patient Education [employer].

## 2015-09-28 NOTE — ED Provider Notes (Signed)
CSN: [id number]     Arrival date & time 09/28/15  1421 History   First MD Initiated Contact with Patient 09/28/15 1504     Chief Complaint  Patient presents with  . Altered Mental Status  . Bradycardia     (Consider location/radiation/quality/duration/timing/severity/associated sxs/prior Treatment) HPI Comments: Patient is a 75 year old female with history of recent hospital admission who presents with intermittent lightheadedness when walking and intermittent tremors for the past 2 days. [facility] the [facility] sent her because she has not been able to walk well in physical therapy. Patient states she has also had mild, intermittent nausea for the past 2 weeks. Patient reports she has not been eating very much and does not drink very much at all, she reports maybe one glass of water daily. Patient has been taking her at home medications as prescribed. Patient had a recent diagnosis of atrial fibrillation. The patient takes Plavix and aspirin. Patient denies any chest pain, shortness of breath, abdominal pain, vomiting, dysuria, vision changes. Patient reports that she has had a headache on the right bridge of her nose that occurs when she has her glasses on. It improves when she takes her glasses off for a while.  Patient is a 75 y.o. female presenting with altered mental status. The history is provided by the patient.  Altered Mental Status Associated symptoms: headaches (R bridge of nose with glasses on), light-headedness and nausea (intermittent prior to hospitalization)   Associated symptoms: no abdominal pain, no fever, no rash and no vomiting     Past Medical History  Diagnosis Date  . Ulcerative colitis   . Osteopenia     s/p 7 yrs Fosamax  . Hypertension   . Hyperlipidemia   . Sleep disorder   . GERD (gastroesophageal reflux disease)   . Heart murmur   . Polio   . Colitis   . Rheumatic fever   . Polymyalgia [facility])    Past Surgical History  Procedure  Laterality Date  . Breast lumpectomy  1986    left; benign  . Neck surgery  1986    Benign growth on neck removed  . Dilation and curettage of uterus  12/1999  . Colonoscopy  2006  . Tonsillectomy    . Melanoma excision  2007    left arm  . Cataract extraction      Left eye   . Colonosopy  07/05/13    Dr. Deatra Ina   Family History  Problem Relation Age of Onset  . Diabetes Paternal Grandmother   . Cancer Paternal Aunt     unaware of primary site  . Other Mother     Coad/ Bronchiectosis  . Coronary artery disease Neg Hx   . Colon cancer Neg Hx   . Rectal cancer Neg Hx   . Stomach cancer Neg Hx   . Pancreatic cancer Other    Social History  Substance Use Topics  . Smoking status: Never Smoker   . Smokeless tobacco: Never Used  . Alcohol Use: Yes     Comment: occasional   OB History    No data available     Review of Systems  Constitutional: Negative for fever and chills.  HENT: Negative for facial swelling and sore throat.   Respiratory: Negative for shortness of breath.   Cardiovascular: Negative for chest pain.  Gastrointestinal: Positive for nausea (intermittent prior to hospitalization). Negative for vomiting and abdominal pain.  Genitourinary: Negative for dysuria.  Musculoskeletal: Negative for back pain.  Skin: Negative for rash and wound.  Neurological: Positive for tremors (intermittent), light-headedness and headaches (R bridge of nose with glasses on).  Psychiatric/Behavioral: The patient is not nervous/anxious.       Allergies  Review of patient's allergies indicates no known allergies.  Home Medications   Prior to Admission medications   Medication Sig Start Date End Date Taking? Authorizing Provider  acetaminophen (TYLENOL) 325 MG tablet Take 650 mg by mouth every 6 (six) hours as needed.   Yes Historical Provider, MD  amLODipine (NORVASC) 5 MG tablet Take 1 tablet (5 mg total) by mouth daily. 09/26/15  Yes Ripudeep Krystal Eaton, MD  aspirin 325 MG  tablet Take 1 tablet (325 mg total) by mouth daily. Cont ASA and plavix for 3 months, then plavix daily 09/26/15  Yes Ripudeep K Rai, MD  atorvastatin (LIPITOR) 10 MG tablet Take 1 tablet (10 mg total) by mouth daily at 6 PM. 09/26/15  Yes Ripudeep K Rai, MD  benazepril-hydrochlorthiazide (LOTENSIN HCT) 20-12.5 MG tablet Take 0.5 tablets by mouth daily.   Yes Historical Provider, MD  Calcium Carbonate-Vitamin D (CALCIUM + D PO) Take 1 tablet by mouth daily.    Yes Historical Provider, MD  cefUROXime (CEFTIN) 500 MG tablet Take 1 tablet (500 mg total) by mouth 2 (two) times daily with a meal. X 5days 09/26/15  Yes Ripudeep K Rai, MD  clopidogrel (PLAVIX) 75 MG tablet Take 1 tablet (75 mg total) by mouth daily. 09/26/15  Yes Ripudeep Krystal Eaton, MD  dimenhyDRINATE (DRAMAMINE) 50 MG tablet Take 1 tablet every 6 hours as needed for nausea or dizziness 09/18/15  Yes [name], MD  fluticasone [facility]) 50 MCG/ACT nasal spray Place 1 spray into both nostrils daily as needed for allergies or rhinitis. Reported on 09/28/2015   Yes Historical Provider, MD  mesalamine (LIALDA) 1.2 g EC tablet take 2 tablets by mouth daily with BREAKFAST 04/26/15  Yes [name], MD  metoprolol tartrate (LOPRESSOR) 25 MG tablet Take 1 tablet (25 mg total) by mouth 2 (two) times daily. 09/26/15  Yes Ripudeep Krystal Eaton, MD  Multiple Vitamin (MULTIVITAMIN) tablet Take 1 tablet by mouth daily.     Yes Historical Provider, MD  predniSONE (DELTASONE) 5 MG tablet TWO TABLETs DAILY TO TREAT POLYMYALGIA RHEUMATICA Patient taking differently: Take 10 mg by mouth daily.  08/28/15  Yes [name], MD  ranitidine (ZANTAC) 150 MG tablet One twice daily to reduce stomach acid Patient taking differently: Take 150 mg by mouth 2 (two) times daily.  05/01/15  Yes [name], MD  senna-docusate (SENOKOT-S) 8.6-50 MG tablet Take 1 tablet by mouth at bedtime as needed for mild constipation. 09/26/15  Yes Ripudeep Krystal Eaton, MD  cephALEXin (KEFLEX) 500 MG capsule  Take 1 capsule (500 mg total) by mouth 2 (two) times daily. 09/28/15   Tempestt Silba M Raja Caputi, PA-C   BP 111/75 mmHg  Pulse 85  Temp(Src) 97.5 F (36.4 C) (Oral)  Resp 20  Wt 53.524 kg  SpO2 100% Physical Exam  Constitutional: She appears well-developed and well-nourished. No distress.  HENT:  Head: Normocephalic and atraumatic.  Mouth/Throat: Oropharynx is clear and moist. No oropharyngeal exudate.  Eyes: Conjunctivae and EOM are normal. Pupils are equal, round, and reactive to light. Right eye exhibits no discharge. Left eye exhibits no discharge. No scleral icterus.  Neck: Normal range of motion. Neck supple. No thyromegaly present.  Cardiovascular: Normal rate, regular rhythm, normal heart sounds and intact distal pulses.  Exam reveals no gallop and no friction rub.   No murmur heard. Pulmonary/Chest: Effort normal and breath sounds normal. No stridor. No respiratory distress. She has no wheezes. She has no rales.  Abdominal: Soft. Bowel sounds are normal. She exhibits no distension. There is no tenderness. There is no rebound and no guarding.  Musculoskeletal: She exhibits no edema.  Lymphadenopathy:    She has no cervical adenopathy.  Neurological: She is alert. Coordination normal.  CN 3-12 intact; normal sensation throughout; 5/5 strength in all 4 extremities; equal bilateral grip strength; no ataxia on finger to nose; no pronator drift; no resting tremor, mild tremor with raising left arm; no slurred speech; patient mentating well  Skin: Skin is warm and dry. No rash noted. She is not diaphoretic. No pallor.  Psychiatric: She has a normal mood and affect.  Nursing note and vitals reviewed.   ED Course  Procedures (including critical care time) Labs Review Labs Reviewed  BASIC METABOLIC PANEL - Abnormal; Notable for the following:    Sodium 132 (*)    Chloride 93 (*)    Glucose, Bld 145 (*)    BUN 33 (*)    Creatinine, Ser 1.23 (*)    GFR calc non Af Amer 42 (*)    GFR calc Af  Amer 48 (*)    All other components within normal limits  CBC WITH DIFFERENTIAL/PLATELET - Abnormal; Notable for the following:    Lymphs Abs 0.5 (*)    All other components within normal limits  URINALYSIS, ROUTINE W REFLEX MICROSCOPIC (NOT AT [facility]) - Abnormal; Notable for the following:    APPearance HAZY (*)    Leukocytes, UA MODERATE (*)    All other components within normal limits  URINE MICROSCOPIC-ADD ON - Abnormal; Notable for the following:    Squamous Epithelial / LPF 0-5 (*)    Bacteria, UA MANY (*)    All other components within normal limits    Imaging Review No results found. I have personally reviewed and evaluated these images and lab results as part of my medical decision-making.   EKG Interpretation None      MDM   CBC unremarkable. BMP shows sodium 132, chloride 93, glucose 145, BUN 33, creatinine 1.23. UA shows moderate leukocytes, many bacteria. Patient given total of 1 L bolus in ED. First dose of Keflex given in ED. Orthostatic vitals stable. Patient able to walk well with little assistance down the hallway without symptoms reported by [facility], such as disequilibrium. I discussed patient with Dr. Roderic Palau who feels that a repeat imaging of head at this time is unnecessary end of low yield considering a normal neuro exam without focal deficits. EKG shows bigeminy which is chronic. Patient also seemed to be in atrial fibrillation on cardiac monitor intermittently. Will discharge home with Keflex with follow-up to primary care provider next week. Patient also has follow-up appointments with cardiology and neurology within the month. I also advised patient to see her optometrist to have her glasses adjusted, as she does endorse that they do feel like they don't fit her well. Strict return precautions given. Patient vitals stable and patient discharged in satisfactory condition.  Final diagnoses:  UTI (lower urinary tract infection)  Episodic lightheadedness         Frederica Kuster, PA-C 09/28/15 2024  Milton Ferguson, MD 09/30/15 [phone]

## 2015-09-28 NOTE — ED Notes (Signed)
Came from [facility] with c/o neuro and heart issues.  Had CVA last week per report, pt felt she was having some symptoms like last week.  Also c/o HR that goes from 30's- 90's.  Was in PT today when she began feeling this way.  EMS states the report they received was scattered.

## 2015-09-28 NOTE — ED Notes (Signed)
Pt has c/o lightheadedness with walking, denies dizziness. Pt states her only pain is located where her glasses are on her right nose and states the pain goes away once the glasses have come off.

## 2015-09-28 NOTE — ED Notes (Signed)
Pt walked to and from restroom.

## 2015-09-30 LAB — URINE CULTURE: Special Requests: NORMAL

## 2015-10-03 ENCOUNTER — Ambulatory Visit: Payer: Medicare Other | Admitting: Physician Assistant

## 2015-10-16 ENCOUNTER — Telehealth: Payer: Self-pay | Admitting: Cardiovascular Disease

## 2015-10-16 NOTE — Telephone Encounter (Signed)
New message      The group home is calling concerning the pt's appointment.  The person at the group home is calling the family has called and re-scheduled the pt's appointment twice and the group home is concerned the pt was orginially suppose to be seen in July 12 th 2 weeks postop from the hospital. Then the family called and re-scheduled  Now the group home is saying the pt needs to be earlier than August

## 2015-10-17 ENCOUNTER — Encounter: Payer: Self-pay | Admitting: *Deleted

## 2015-10-18 ENCOUNTER — Ambulatory Visit: Payer: Medicare Other | Admitting: Physician Assistant

## 2015-10-18 NOTE — Telephone Encounter (Signed)
Returned call to [place] from [facility]. Allena Napoleon talked to scheduling yesterday and is aware there are no openings sooner than patient's scheduled appointment.

## 2015-10-23 ENCOUNTER — Ambulatory Visit: Payer: Medicare Other | Admitting: Nurse Practitioner

## 2015-10-23 ENCOUNTER — Non-Acute Institutional Stay: Payer: Medicare Other | Admitting: Internal Medicine

## 2015-10-23 VITALS — BP 124/68 | HR 60 | Temp 97.4°F | Ht 62.0 in | Wt 117.0 lb

## 2015-10-23 DIAGNOSIS — M353 Polymyalgia rheumatica: Secondary | ICD-10-CM | POA: Diagnosis not present

## 2015-10-23 DIAGNOSIS — R7303 Prediabetes: Secondary | ICD-10-CM

## 2015-10-23 DIAGNOSIS — N3000 Acute cystitis without hematuria: Secondary | ICD-10-CM

## 2015-10-23 DIAGNOSIS — E785 Hyperlipidemia, unspecified: Secondary | ICD-10-CM

## 2015-10-23 DIAGNOSIS — I639 Cerebral infarction, unspecified: Secondary | ICD-10-CM

## 2015-10-23 DIAGNOSIS — R350 Frequency of micturition: Secondary | ICD-10-CM

## 2015-10-23 DIAGNOSIS — I10 Essential (primary) hypertension: Secondary | ICD-10-CM

## 2015-10-23 DIAGNOSIS — R42 Dizziness and giddiness: Secondary | ICD-10-CM

## 2015-10-23 NOTE — Progress Notes (Signed)
Patient ID: Tammy Beard, female   DOB: 01-09-1941, 75 y.o.   MRN: [id number]    [place] Room Number: N31  Place of Service: Clinic (12)     No Known Allergies  Chief Complaint  Patient presents with  . Hospitalization Follow-up    in ED 09/28/15 had lightheadedness with walking, nausea. Dx Altered mental status, bradycardia. Here with brother Laurey Arrow, sister Juliann Pulse.    HPI:  Patient is seen in the emergency department 09/28/2015. She had urinary tract infection diagnosed. Dizziness which appeared to be a part of the constellation of symptoms at that time has improved.  She remains very unstable with walking. 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  Other chronic issues are doing better. Blood pressure has improved enough that we will stop amlodipine.  She does continue to complain of urinary frequency and nocturia 5. There is no dysuria. Urinary tract infection was treated 09/28/2015.  There is no difficulty swallowing and she denies heartburn. She remains on ranitidine 150 mg twice daily at this time.  Patient is progressing with her rehabilitation following her CVA. She is not independent yet. Plans are to move her to assisted living for a few weeks prior to going back to independent living. I quizzed the patient and her brother and sister who are in attendance as to whether a return to independent living should still remain a cold. They are all content with continuing to pay for the apartment in independent living while a few more weeks pass.  In the course of today's visit, patient expresses awareness that she is not capable of living independently at this time because of her balance, gait instability, and potential for problems with managing medication. She has some visual losses which may interfere with her ability to sort her medications. She previously was independent in managing her  medications.  Medications: Patient's Medications  New Prescriptions   No medications on file  Previous Medications   ACETAMINOPHEN (TYLENOL) 325 MG TABLET    Take 650 mg by mouth every 6 (six) hours as needed.   AMLODIPINE (NORVASC) 5 MG TABLET    Take 1 tablet (5 mg total) by mouth daily.   ASPIRIN 325 MG TABLET    Take 1 tablet (325 mg total) by mouth daily. Cont ASA and plavix for 3 months, then plavix daily   ATORVASTATIN (LIPITOR) 10 MG TABLET    Take 1 tablet (10 mg total) by mouth daily at 6 PM.   BENAZEPRIL-HYDROCHLORTHIAZIDE (LOTENSIN HCT) 20-12.5 MG TABLET    Take 0.5 tablets by mouth daily.   CALCIUM CARBONATE-VITAMIN D (CALCIUM + D PO)    Take 1 tablet by mouth daily.    CEFUROXIME (CEFTIN) 500 MG TABLET    Take 1 tablet (500 mg total) by mouth 2 (two) times daily with a meal. X 5days   CEPHALEXIN (KEFLEX) 500 MG CAPSULE    Take 1 capsule (500 mg total) by mouth 2 (two) times daily.   CLOPIDOGREL (PLAVIX) 75 MG TABLET    Take 1 tablet (75 mg total) by mouth daily.   DIMENHYDRINATE (DRAMAMINE) 50 MG TABLET    Take 1 tablet every 6 hours as needed for nausea or dizziness   FLUTICASONE (FLONASE) 50 MCG/ACT NASAL SPRAY    Place 1 spray into both nostrils daily as needed for allergies or rhinitis. Reported on 09/28/2015   MESALAMINE (LIALDA) 1.2 G EC TABLET    take  2 tablets by mouth daily with BREAKFAST   METOPROLOL TARTRATE (LOPRESSOR) 25 MG TABLET    Take 1 tablet (25 mg total) by mouth 2 (two) times daily.   MULTIPLE VITAMIN (MULTIVITAMIN) TABLET    Take 1 tablet by mouth daily.     PREDNISONE (DELTASONE) 5 MG TABLET    TWO TABLETs DAILY TO TREAT POLYMYALGIA RHEUMATICA   RANITIDINE (ZANTAC) 150 MG TABLET    One twice daily to reduce stomach acid   SENNA-DOCUSATE (SENOKOT-S) 8.6-50 MG TABLET    Take 1 tablet by mouth at bedtime as needed for mild constipation.  Modified Medications   No medications on file  Discontinued Medications   No medications on file     Review of Systems   Constitutional: Negative for activity change, appetite change, chills, diaphoresis, fatigue, fever and unexpected weight change.  HENT: Negative for congestion, ear discharge, ear pain, hearing loss, postnasal drip, rhinorrhea, sore throat, tinnitus, trouble swallowing and voice change.   Eyes: Positive for visual disturbance (corrective lenses). Negative for pain, redness and itching.  Respiratory: Negative for cough, choking, shortness of breath and wheezing.   Cardiovascular: Negative for chest pain, palpitations and leg swelling.  Gastrointestinal: Positive for diarrhea, nausea and vomiting. Negative for abdominal distention, abdominal pain and constipation.       Hx ulcerative colitis; on Lialda.  Endocrine: Negative for cold intolerance, heat intolerance, polydipsia, polyphagia and polyuria.       History of elevated glucose.  Genitourinary: Negative for difficulty urinating, dysuria, flank pain, frequency, hematuria, pelvic pain, urgency and vaginal discharge.  Musculoskeletal: Positive for back pain and gait problem. Negative for arthralgias, myalgias, neck pain and neck stiffness.       Hx PMR and chronic use pf prednisone. History of osteopenia and kyphosis  Skin: Negative for color change, pallor and rash.  Allergic/Immunologic: Negative.   Neurological: Positive for dizziness and light-headedness. Negative for tremors, seizures, syncope, weakness, numbness and headaches.       History of poliomyelitis at age 35  Hematological: Negative for adenopathy. Does not bruise/bleed easily.  Psychiatric/Behavioral: Negative for agitation, behavioral problems, confusion, dysphoric mood, hallucinations, sleep disturbance and suicidal ideas. The patient is not nervous/anxious and is not hyperactive.     There were no vitals filed for this visit. Wt Readings from Last 3 Encounters:  09/28/15 118 lb (53.5 kg)  09/27/15 118 lb (53.5 kg)  09/22/15 121 lb 14.6 oz (55.3 kg)    There is no  height or weight on file to calculate BMI.  Physical Exam  Constitutional: She is oriented to person, place, and time. She appears well-developed and well-nourished. No distress.  HENT:  Right Ear: External ear normal.  Left Ear: External ear normal.  Nose: Nose normal.  Mouth/Throat: Oropharynx is clear and moist. No oropharyngeal exudate.  Wearing hearing aids bilaterally. EAC and TM normal bilaterally.  Eyes: Conjunctivae and EOM are normal. Pupils are equal, round, and reactive to light. No scleral icterus.  Prescription lenses  Neck: No JVD present. No tracheal deviation present. No thyromegaly present.  Cardiovascular: Normal rate, regular rhythm, normal heart sounds and intact distal pulses.  Exam reveals no gallop and no friction rub.   No murmur heard. Pulmonary/Chest: Effort normal. No respiratory distress. She has no wheezes. She has no rales. She exhibits no tenderness.  Abdominal: Soft. Bowel sounds are normal. She exhibits no distension and no mass. There is no tenderness.  Large diastasis recti  Musculoskeletal: Normal range of motion. She exhibits  no edema or tenderness.  Kyphosis.Marland Kitchen Unstable gait.  Lymphadenopathy:    She has no cervical adenopathy.  Neurological: She is alert and oriented to person, place, and time. No cranial nerve deficit. Coordination normal.  Mild tremor of extended arms  Skin: No rash noted. She is not diaphoretic. No erythema. No pallor.  Psychiatric: She has a normal mood and affect. Her behavior is normal. Judgment and thought content normal.     Labs reviewed: Lab Summary Latest Ref Rng & Units 09/28/2015 09/26/2015 09/25/2015 09/24/2015  Hemoglobin 12.0 - 15.0 g/dL 12.7 (None) 12.4 11.2(L)  Hematocrit 36.0 - 46.0 % 40.3 (None) 39.0 36.1  White count 4.0 - 10.5 K/uL 7.6 (None) 9.8 4.2  Platelet count 150 - 400 K/uL 212 (None) 148(L) 127(L)  Sodium 135 - 145 mmol/L 132(L) 134(L) 135 136  Potassium 3.5 - 5.1 mmol/L 4.4 4.3 3.7 3.6  Calcium 8.9 -  10.3 mg/dL 9.9 9.3 9.3 8.8(L)  Phosphorus - (None) (None) (None) (None)  Creatinine 0.44 - 1.00 mg/dL 1.23(H) 1.12(H) 1.09(H) 1.05(H)  AST - (None) (None) (None) (None)  Alk Phos - (None) (None) (None) (None)  Bilirubin - (None) (None) (None) (None)  Glucose 65 - 99 mg/dL 145(H) 94 120(H) 97  Cholesterol - (None) (None) (None) (None)  HDL cholesterol - (None) (None) (None) (None)  Triglycerides - (None) (None) (None) (None)  LDL Direct - (None) (None) (None) (None)  LDL Calc - (None) (None) (None) (None)  Total protein - (None) (None) (None) (None)  Albumin - (None) (None) (None) (None)  Some recent data might be hidden   Lab Results  Component Value Date   TSH 2.008 09/25/2015   Lab Results  Component Value Date   BUN 33 (H) 09/28/2015   BUN 20 09/26/2015   BUN 13 09/25/2015   Lab Results  Component Value Date   CREATININE 1.23 (H) 09/28/2015   CREATININE 1.12 (H) 09/26/2015   CREATININE 1.09 (H) 09/25/2015   Lab Results  Component Value Date   HGBA1C 6.0 08/23/2015   HGBA1C 6.1 04/26/2015   HGBA1C 5.9 12/07/2014       Assessment/Plan  1. Cerebrovascular accident (CVA), unspecified mechanism ([place]) Continues to benefit from rehabilitative measures.  2. Disequilibrium This is the major residual effect and we believe is related to her CVA  3. Acute cystitis without hematuria Previously treated in early July, but because of persistent urinary frequency we will recheck urinalysis and culture.  4. Urinary frequency -Urinalysis culture  5. Essential hypertension Discontinue amlodipine. Continue losartan/HCTZ and metoprolol  6. Dyslipidemia -Lipid panel prior to next visit.  7. Polymyalgia rheumatica ([place]) ESR prior to next visit  8. Pre-diabetes -CMP prior to next visit

## 2015-10-26 DIAGNOSIS — H04123 Dry eye syndrome of bilateral lacrimal glands: Secondary | ICD-10-CM | POA: Diagnosis not present

## 2015-10-26 DIAGNOSIS — H1789 Other corneal scars and opacities: Secondary | ICD-10-CM | POA: Diagnosis not present

## 2015-10-26 DIAGNOSIS — H16103 Unspecified superficial keratitis, bilateral: Secondary | ICD-10-CM | POA: Diagnosis not present

## 2015-10-26 DIAGNOSIS — H5203 Hypermetropia, bilateral: Secondary | ICD-10-CM | POA: Diagnosis not present

## 2015-11-01 NOTE — Progress Notes (Signed)
Cardiology Office Note    Date:  11/06/2015   ID:  Tammy Beard, DOB 02-25-1941, MRN [id number]  PCP:  Jeanmarie Hubert, MD  Cardiologist:  Dr Curt Bears  CC: post hospital follow up  History of Present Illness:  Tammy Beard is a 75 y.o. female with a history of HTN, HLD, ulcerative colitis, rheumatic fever, and recently diagnosed CVA and atrial tachycardia who presents to clinic for follow up.  H/o rheumatic fever followed by Dr. Aundra Dubin: has had murmur since childhood.  Echo in 7/12 showed EF 55-60%, mild focal basal septal hypertrophy, grade II diastolic dysfunction, the LV outflow tract was narrowed with turbulent flow but no significant LVOT gradient measured, ascending aorta mildly dilated at 3.9 cm, normal RV.   Admitted from 6/30-09/26/15 for acute CVA.  Patient was having dizziness and poor balance for 5 days prompting ER admission. MRI of the brain showed questionable tiny acute infarct medial aspect of posterior right frontal parietal lobe junction. Patient underwent full stroke workup. 2-D echo with EF of 50-55%, mild LVH with grade 2 diastolic dysfunction. No source of cardiac emboli. CT angiogram of head and neck showed severe intracranial stenosis bilateral ICA terminus, B/L MCA M1 and bifurcation with attenuated right MCA branches. She was placed on ASA/Plavix for 3 months followed by plavix only. During her admission, cardiology was consulted for atrial tachycardia. No afib/flutter identified. Outpatient 30 day monitor was recommended to rule out atrial fibrillation. She was started on a lopressor 25mg  BID.  She was seen in ER on 09/28/15 for disequilibrium. No acute findings in ER and she was discharged home to [facility].   She saw Dr. Erlinda Hong on 11/02/15 and amlodipine was discontinued due to low BPs. It was felt she needed to keep hydrated to avoid hypotension due to severe intracranial stenosis. However, she did not actually stop it bc he only "recommended" this.     Today she presents to clinic for follow up. She brought  BP log which shows labile BPs ranging from 93/42-172/78. Otherwise, she is doing quite well. No CP, SOB or palpitations. No recurrent dizziness or syncope. No complaints. No LE edema, orthopnea or PND. No blood in stool or urine. She does have some bruising from DAPT.   Past Medical History:  Diagnosis Date  . Colitis   . GERD (gastroesophageal reflux disease)   . Heart murmur   . Hyperlipidemia   . Hypertension   . Osteopenia    s/p 7 yrs Fosamax  . Polio   . Polymyalgia ([place])   . Rheumatic fever   . Sleep disorder   . Stroke ([place])   . Ulcerative colitis     Past Surgical History:  Procedure Laterality Date  . BREAST LUMPECTOMY  1986   left; benign  . CATARACT EXTRACTION     Left eye   . COLONOSCOPY  2006  . Colonosopy  07/05/13   Dr. Deatra Ina  . DILATION AND CURETTAGE OF UTERUS  12/1999  . MELANOMA EXCISION  2007   left arm  . NECK SURGERY  1986   Benign growth on neck removed  . TONSILLECTOMY      Current Medications: Outpatient Medications Prior to Visit  Medication Sig Dispense Refill  . acetaminophen (TYLENOL) 325 MG tablet Take 650 mg by mouth every 6 (six) hours as needed for mild pain or headache.     Marland Kitchen aspirin 325 MG tablet Take 1 tablet (325 mg total) by mouth daily. Cont ASA and  plavix for 3 months, then plavix daily    . atorvastatin (LIPITOR) 10 MG tablet Take 1 tablet (10 mg total) by mouth daily at 6 PM.    . benazepril-hydrochlorthiazide (LOTENSIN HCT) 20-12.5 MG tablet Take 0.5 tablets by mouth daily.    . Calcium Carbonate-Vitamin D (CALCIUM + D PO) Take 1 tablet by mouth daily.     . clopidogrel (PLAVIX) 75 MG tablet Take 1 tablet (75 mg total) by mouth daily.    Marland Kitchen dimenhyDRINATE (DRAMAMINE) 50 MG tablet Take 1 tablet every 6 hours as needed for nausea or dizziness 24 tablet 0  . fluticasone (FLONASE) 50 MCG/ACT nasal spray Place 1 spray into both nostrils daily as needed for allergies or  rhinitis. Reported on 09/28/2015    . mesalamine (LIALDA) 1.2 g EC tablet take 2 tablets by mouth daily with BREAKFAST 60 tablet 6  . metoprolol tartrate (LOPRESSOR) 25 MG tablet Take 1 tablet (25 mg total) by mouth 2 (two) times daily.    . Multiple Vitamin (MULTIVITAMIN) tablet Take 1 tablet by mouth daily.      . predniSONE (DELTASONE) 5 MG tablet TWO TABLETs DAILY TO TREAT POLYMYALGIA RHEUMATICA (Patient taking differently: Take 10 mg by mouth daily. ) 180 tablet 2  . ranitidine (ZANTAC) 150 MG tablet One twice daily to reduce stomach acid (Patient taking differently: Take 150 mg by mouth 2 (two) times daily. ) 180 tablet 3  . senna-docusate (SENOKOT-S) 8.6-50 MG tablet Take 1 tablet by mouth at bedtime as needed for mild constipation. (Patient taking differently: Take 1 tablet by mouth 2 (two) times daily. )    . cefUROXime (CEFTIN) 500 MG tablet Take 1 tablet (500 mg total) by mouth 2 (two) times daily with a meal. X 5days    . cephALEXin (KEFLEX) 500 MG capsule Take 1 capsule (500 mg total) by mouth 2 (two) times daily. (Patient not taking: Reported on 11/06/2015) 14 capsule 0   No facility-administered medications prior to visit.      Allergies:   Review of patient's allergies indicates no known allergies.   Social History   Social History  . Marital status: Single    Spouse name: N/A  . Number of children: N/A  . Years of education: N/A   Occupational History  . retired    Social History Main Topics  . Smoking status: Never Smoker  . Smokeless tobacco: Never Used  . Alcohol use Yes     Comment: occasional  . Drug use: No  . Sexual activity: Not Asked   Other Topics Concern  . None   Social History Narrative   Daily Caffeine use- soda (diet)   Patient gets regular exercise         Diet: None      Do you drink/ eat things with caffeine? Yes, sometimes      Marital status:    Never                            Lives at [facility], transfer to skill unit 09/26/15           How many persons live in your home ?  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    No  Do you have a living will? Yes      Do you have a DNR form?                       If not, do you want to discuss one?       Do you have signed POA?HPOA forms?                 If so, please bring to your        appointment           Family History:  The patient's family history includes Cancer in her paternal aunt; Diabetes in her paternal grandmother; Heart failure in her paternal grandmother; Other in her mother; Pancreatic cancer in her other.     ROS:   Please see the history of present illness.    ROS All other systems reviewed and are negative.   PHYSICAL EXAM:   VS:  BP 120/60   Pulse 62   Ht 5\' 1"  (1.549 m)   Wt 121 lb 6.4 oz (55.1 kg)   BMI 22.94 kg/m    GEN: Well nourished, well developed, in no acute distress  HEENT: normal  Neck: no JVD, carotid bruits, or masses Cardiac: RRR; + murmurs, rubs, or gallops,no edema  Respiratory:  clear to auscultation bilaterally, normal work of breathing GI: soft, nontender, nondistended, + BS MS: no deformity or atrophy  Skin: warm and dry, no rash Neuro:  Alert and Oriented x 3, Strength and sensation are intact Psych: euthymic mood, full affect  Wt Readings from Last 3 Encounters:  11/06/15 121 lb 6.4 oz (55.1 kg)  11/02/15 120 lb 11.2 oz (54.7 kg)  10/23/15 117 lb (53.1 kg)      Studies/Labs Reviewed:   EKG:  EKG is ordered today.  The ekg ordered today demonstrates sinus arrhythmia.   Recent Labs: 04/26/2015: ALT 26 09/25/2015: TSH 2.008 09/26/2015: Magnesium 2.3 09/28/2015: BUN 33; Creatinine, Ser 1.23; Hemoglobin 12.7; Platelets 212; Potassium 4.4; Sodium 132   Lipid Panel    Component Value Date/Time   CHOL 180 08/23/2015   TRIG 222 (A) 08/23/2015   TRIG 115 02/05/2006 1016   HDL 73  (A) 08/23/2015   CHOLHDL 3 11/14/2008 1037   VLDL 25.2 11/14/2008 1037   LDLCALC 63 08/23/2015    Additional studies/ records that were reviewed today include:  2D ECHO 09/23/15 Study Conclusions - Left ventricle: The cavity size was normal. Wall thickness was  increased in a pattern of mild LVH. Systolic function was normal.  The estimated ejection fraction was in the range of 50% to 55%.  Features are consistent with a pseudonormal left ventricular  filling pattern, with concomitant abnormal relaxation and  increased filling pressure (grade 2 diastolic dysfunction). - Aortic valve: Mildly calcified annulus. Mildly thickened, mildly  calcified leaflets. There was trivial regurgitation. - Mitral valve: Mildly calcified annulus. Mildly thickened leaflets    ASSESSMENT & PLAN:   Atrial tachycardia: asymptomatic. No atrial fibrillation identified. With frequency of atrial ectopy, the likelihood of her having AF was felt to be high 30 day outpatient monitor recommended. Continue Lopressor 25mg  BID.   Stroke: 2/2 intracranial stenosis but cannot rule out embolic source per neurology. 30 day monitor as above. ASA/Plavix x3 months f/b plavix only per neuro as well as statin.   Murmur:  Long standing. No LVOT gradient AV sclerosis on echo  Labile HTN: She brought  BP log which shows labile BPs ranging  from 93/42-172/78. Neuro had told hold her amlodipine which they never did bc she only said it was "recommended". Dr. Erlinda Hong said she needed to keep hydrated to avoid hypotension due to severe intracranial stenosis. Will set parameters to for her to hold amlodipine if BP < 110/50. Otherwise take meds as prescribed.   Medication Adjustments/Labs and Tests Ordered: Current medicines are reviewed at length with the patient today.  Concerns regarding medicines are outlined above.  Medication changes, Labs and Tests ordered today are listed in the Patient Instructions below. Patient Instructions   Medication Instructions:  Your physician has recommended you make the following change in your medication:  1.  DO NOT take the Amlodipine if your blood pressure runs 110/50 or lower   Labwork: None ordered  Testing/Procedures: Your physician has recommended that you wear an event monitor. Event monitors are medical devices that record the heart's electrical activity. Doctors most often [place] these monitors to diagnose arrhythmias. Arrhythmias are problems with the speed or rhythm of the heartbeat. The monitor is a small, portable device. You can wear one while you do your normal daily activities. This is usually used to diagnose what is causing palpitations/syncope (passing out).    Follow-Up: Your physician recommends that you schedule a follow-up appointment in: WILL BE BASED UPON RESULTS   Any Other Special Instructions Will Be Listed Below (If Applicable).   Cardiac Event Monitoring A cardiac event monitor is a small recording device used to help detect abnormal heart rhythms (arrhythmias). The monitor is used to record heart rhythm when noticeable symptoms such as the following occur:  Fast heartbeats (palpitations), such as heart racing or fluttering.  Dizziness.  Fainting or light-headedness.  Unexplained weakness. The monitor is wired to two electrodes placed on your chest. Electrodes are flat, sticky disks that attach to your skin. The monitor can be worn for up to 30 days. You will wear the monitor at all times, except when bathing.  HOW TO USE YOUR CARDIAC EVENT MONITOR A technician will prepare your chest for the electrode placement. The technician will show you how to place the electrodes, how to work the monitor, and how to replace the batteries. Take time to practice using the monitor before you leave the office. Make sure you understand how to send the information from the monitor to your health care provider. This requires a telephone with a landline, not a cell  phone. You need to:  Wear your monitor at all times, except when you are in water:  Do not get the monitor wet.  Take the monitor off when bathing. Do not swim or use a hot tub with it on.  Keep your skin clean. Do not put body lotion or moisturizer on your chest.  Change the electrodes daily or any time they stop sticking to your skin. You might need to use tape to keep them on.  It is possible that your skin under the electrodes could become irritated. To keep this from happening, try to put the electrodes in slightly different places on your chest. However, they must remain in the area under your left breast and in the upper right section of your chest.  Make sure the monitor is safely clipped to your clothing or in a location close to your body that your health care provider recommends.  Press the button to record when you feel symptoms of heart trouble, such as dizziness, weakness, light-headedness, palpitations, thumping, shortness of breath, unexplained weakness, or a fluttering or racing  heart. The monitor is always on and records what happened slightly before you pressed the button, so do not worry about being too late to get good information.  Keep a diary of your activities, such as walking, doing chores, and taking medicine. It is especially important to note what you were doing when you pushed the button to record your symptoms. This will help your health care provider determine what might be contributing to your symptoms. The information stored in your monitor will be reviewed by your health care provider alongside your diary entries.  Send the recorded information as recommended by your health care provider. It is important to understand that it will take some time for your health care provider to process the results.  Change the batteries as recommended by your health care provider. SEEK IMMEDIATE MEDICAL CARE IF:   You have chest pain.  You have extreme difficulty  breathing or shortness of breath.  You develop a very fast heartbeat that persists.  You develop dizziness that does not go away.  You faint or constantly feel you are about to faint.   This information is not intended to replace advice given to you by your health care provider. Make sure you discuss any questions you have with your health care provider.   Document Released: 12/18/2007 Document Revised: 03/31/2014 Document Reviewed: 09/06/2012 Elsevier Interactive Patient Education [employer].    If you need a refill on your cardiac medications before your next appointment, please call your pharmacy.      Signed, Angelena Form, PA-C  11/06/2015 12:32 PM    [facility] [address], [place], [place]  [address] Phone: [phone]; Fax: [phone]

## 2015-11-02 ENCOUNTER — Encounter: Payer: Self-pay | Admitting: Neurology

## 2015-11-02 ENCOUNTER — Ambulatory Visit (INDEPENDENT_AMBULATORY_CARE_PROVIDER_SITE_OTHER): Payer: Medicare Other | Admitting: Neurology

## 2015-11-02 VITALS — BP 104/47 | HR 62 | Ht 61.0 in | Wt 120.7 lb

## 2015-11-02 DIAGNOSIS — I63521 Cerebral infarction due to unspecified occlusion or stenosis of right anterior cerebral artery: Secondary | ICD-10-CM | POA: Diagnosis not present

## 2015-11-02 DIAGNOSIS — E785 Hyperlipidemia, unspecified: Secondary | ICD-10-CM

## 2015-11-02 DIAGNOSIS — I639 Cerebral infarction, unspecified: Secondary | ICD-10-CM | POA: Diagnosis not present

## 2015-11-02 DIAGNOSIS — R002 Palpitations: Secondary | ICD-10-CM

## 2015-11-02 DIAGNOSIS — I10 Essential (primary) hypertension: Secondary | ICD-10-CM | POA: Diagnosis not present

## 2015-11-02 DIAGNOSIS — N3000 Acute cystitis without hematuria: Secondary | ICD-10-CM

## 2015-11-02 NOTE — Patient Instructions (Addendum)
-   continue ASA and plavix for another 2 months and then plavix alone - continue lipitor for stroke prevention.  - follow up with cardiology next week regarding atrial arrhythmia  - will order 30 day cardiac event monitoring again - Follow up with your primary care physician for stroke risk factor modification. Recommend maintain blood pressure goal <130/80, diabetes with hemoglobin A1c goal below 7.0% and lipids with LDL cholesterol goal below 70 mg/dL.  - will recommend to discontinue amlodipine due to low BP but continue metoprolol for arrhythmia. - check BP at facility and record and bring over with you for cardiology follow up - continue work with PT/OT in facility  - severe intracranial vessel stenosis, so you need to keep hydrated and avoid low BP - follow up in 3 months.

## 2015-11-02 NOTE — Progress Notes (Signed)
STROKE NEUROLOGY FOLLOW UP NOTE  NAME: Tammy Beard DOB: 1940/11/17  REASON FOR VISIT: stroke follow up HISTORY FROM: chart and brother  Today we had the pleasure of seeing WILLIAM TORSIELLO in follow-up at our [facility]. Pt was accompanied by brother.   History Summary Ms. Tammy Beard is a 75 y.o. female with history of hypertension, hyperlipidemia, and previous stroke ( by MRI ) admitted on 09/21/15 for dizziness and unstable gait. found to have UTI and dehydration, received Abx. MRI showed tiny acute right ACA infarct. CTA head and neck showed severe intracranial stenosis involving b/l terminus ICA, b/l MCA M1 and bifurcation and attenuated right MCA branches. DVT, TTE negative. LDL 63 and A1C 6.0. She was put on DAPT for 3 months and recommend 30 day cardiac event monitoring on discharge.   Interval History During the interval time, the patient has been doing fair.  She went to ED on 09/28/15 for intermittent lightheadedness when walking and intermittent tremors for 2 days. Found again to have dehydration and UTI. Given IV bolus and Abx. ED note stated that " Patient had a recent diagnosis of atrial fibrillation" and "Patient also seemed to be in atrial fibrillation on cardiac monitor intermittently", for which I am not aware of. Her tele strip did show MAT, but no afib. She has cardiology appointment on 11/06/15. BP today 104/70. Brother stated that she did not drink water very well in NH. She walks with PT/OT but still more in the wheelchair not able to walk yet.   REVIEW OF SYSTEMS: Full 14 system review of systems performed and notable only for those listed below and in HPI above, all others are negative:  Constitutional:  fatigue Cardiovascular:  Ear/Nose/Throat:  Hearing loss, runny nose, trouble swallowing Skin: moles, itching Eyes:  Loss of vision Respiratory:   Gastroitestinal:  Swollen abdomen, constipation Genitourinary: frequent urination,  urgency Hematology/Lymphatic:  Bruising easily Endocrine:  Musculoskeletal:  Back pain, walking difficulty Allergy/Immunology:  Frequent infection Neurological:  Weakness in legs Psychiatric:  Sleep: daytime sleepiness  The following represents the patient's updated allergies and side effects list: No Known Allergies  The neurologically relevant items on the patient's problem list were reviewed on today's visit.  Neurologic Examination  A problem focused neurological exam (12 or more points of the single system neurologic examination, vital signs counts as 1 point, cranial nerves count for 8 points) was performed.  Blood pressure (!) 104/47, pulse 62, height 5\' 1"  (1.549 m), weight 120 lb 11.2 oz (54.7 kg).  General - Well nourished, well developed, in no apparent distress.  Ophthalmologic - Sharp disc margins OU.   Cardiovascular - Regular rate and rhythm,  No afib rhythm.   Mental Status -  Level of arousal and orientation to time, place, and person were intact. Language including expression, naming, repetition, comprehension was assessed and found intact. Fund of Knowledge was assessed and was intact.  Cranial Nerves II - XII - II - Visual field intact OU. III, IV, VI - Extraocular movements intact V - Facial sensation intact bilaterally. VII - Facial movement intact bilaterally. VIII - Hearing & vestibular intact bilaterally. X - Palate elevates symmetrically. XI - Chin turning & shoulder shrug intact bilaterally. XII - Tongue protrusion intact.  Motor Strength - The patient's strength was 4+/5 in all extremities and pronator drift was absent.  Bulk was normal and fasciculations were absent.   Motor Tone - Muscle tone was assessed at the neck and appendages and was  normal.  Reflexes - The patient's reflexes were 1+ in all extremities and she had no pathological reflexes.  Sensory - Light touch, temperature/pinprick were assessed and were normal.    Coordination -  The patient had normal movements in the hands with no ataxia or dysmetria.  Tremor was absent.  Gait and Station - in wheelchair, not tested.   Functional score  mRS = 3   0 - No symptoms.   1 - No significant disability. Able to carry out all usual activities, despite some symptoms.   2 - Slight disability. Able to look after own affairs without assistance, but unable to carry out all previous activities.   3 - Moderate disability. Requires some help, but able to walk unassisted.   4 - Moderately severe disability. Unable to attend to own bodily needs without assistance, and unable to walk unassisted.   5 - Severe disability. Requires constant nursing care and attention, bedridden, incontinent.   6 - Dead.   NIH Stroke Scale = 0   Data reviewed: I personally reviewed the images and agree with the radiology interpretations.  Dg Chest 2 View 09/21/2015   COPD.  No evidence of pneumothorax or other acute findings. Aortic atherosclerosis noted.   Ct Head Wo Contrast 09/21/2015   No acute intracranial abnormalities. Generalized atrophy. Mega cisterna magna.   Mr Brain Wo Contrast 09/21/2015   Patient was not able to complete the examination. Sequences obtained are motion degraded. Question tiny acute infarct medial aspect posterior right frontal -parietal lobe junction. Remote infarct superior vermis. No intracranial hemorrhage. Mild chronic microvascular changes. Moderate global atrophy without hydrocephalus.   Mr Jodene Nam Head/brain Wo Cm 09/22/2015   Severely limited study due to motion artifact. No large or proximal arterial branch occlusion. No definite high-grade or correctable stenosis, although evaluation is fairly limited.   Ct Angio Head & Neck W Or Wo Contrast 09/24/2015   1. Negative for emergent large vessel occlusion but positive for SEVERE intracranial arterial stenoses: - Left ICA terminus (CRITICAL stenosis - see series 604 image 88), - Right MCA distal M1 and  bifurcation (SEVERE stenoses) with attenuated Right MCA branches. 2. Atherosclerosis with mild to moderate stenosis of the right ICA siphon and left MCA M1 segment. 3. No extracranial arterial stenosis. Tortuous cervical carotid and vertebral arteries. 4. Stable CT appearance of the brain. 5. Small layering left pleural effusion. Apical pulmonary scarring greater on the right.   2D Echocardiogram  - Left ventricle: The cavity size was normal. Wall thickness was increased in a pattern of mild LVH. Systolic function was normal. The estimated ejection fraction was in the range of 50% to 55%. Features are consistent with a pseudonormal left ventricular filling pattern, with concomitant abnormal relaxation and increased filling pressure (grade 2 diastolic dysfunction). - Aortic valve: Mildly calcified annulus. Mildly thickened, mildly calcified leaflets. There was trivial regurgitation. - Mitral valve: Mildly calcified annulus. Mildly thickened leaflets  LE venous doppler - Negative for deep and superficial vein thrombosis in both leg  Component     Latest Ref Rng & Units 08/23/2015 09/24/2015 09/25/2015  Triglycerides     40 - 160 mg/dL 222 (A)    Cholesterol     0 - 200 mg/dL 180    HDL Cholesterol     35 - 70 mg/dL 73 (A)    LDL (calc)     mg/dL 63    Hemoglobin A1C      6.0    Sed Rate  0 - 22 mm/hr  45 (H)   CRP     <1.0 mg/dL  1.1 (H)   TSH     0.350 - 4.500 uIU/mL   2.008    Assessment: As you may recall, she is a 75 y.o. Caucasian female with PMH of hypertension, hyperlipidemia admitted on 09/21/15 for dizziness and unstable gait. found to have UTI and dehydration, received Abx. MRI showed tiny acute right ACA infarct. CTA head and neck showed severe intracranial stenosis involving b/l terminus ICA, b/l MCA M1 and bifurcation and attenuated right MCA branches. DVT, TTE negative. LDL 63 and A1C 6.0. Stroke considered due to dehydration and infection in the setting of intracranial  stenosis. She was put on DAPT for 3 months and recommend 30 day cardiac event monitoring on discharge. She went to ED on 09/28/15 for intermittent lightheadedness when walking and intermittent tremors for 2 days. Found again to have dehydration and UTI. Given IV bolus and Abx. ED note stated that " Patient had a recent diagnosis of atrial fibrillation" and "Patient also seemed to be in atrial fibrillation on cardiac monitor intermittently", for which I am not aware of. Her tele strip did show MAT, but no afib. She has cardiology appointment on 11/06/15. She walks with PT/OT but still largely in the wheelchair not able to walk yet likely due to de-conditioning.    Plan:  - continue ASA and plavix for another 2 months and then plavix alone - continue lipitor for stroke prevention.  - follow up with cardiology next week regarding atrial arrhythmia  - will order 30 day cardiac event monitoring  - Follow up with your primary care physician for stroke risk factor modification. Recommend maintain blood pressure goal <130/80, diabetes with hemoglobin A1c goal below 7.0% and lipids with LDL cholesterol goal below 70 mg/dL.  - will recommend to discontinue amlodipine due to low BP but continue metoprolol for arrhythmia. - check BP at facility and record and bring over to cardiology follow up - continue work with PT/OT in facility  - severe intracranial vessel stenosis, please keep hydrated and avoid low BP - follow up in 3 months.   I spent more than 25 minutes of face to face time with the patient. Greater than 50% of time was spent in counseling and coordination of care. We discussed keep hydration and BP management, cardiac monitoring and cardiology follow up as well as PT/OT.   Orders Placed This Encounter  Procedures  . Cardiac event monitor    Standing Status:   Future    Standing Expiration Date:   11/02/2016    Scheduling Instructions:     Request cardionet setup. Thank you.    Order Specific  Question:   Where should this test be performed?    Answer:   [facility]    No orders of the defined types were placed in this encounter.   Patient Instructions  - continue ASA and plavix for another 2 months and then plavix alone - continue lipitor for stroke prevention.  - follow up with cardiology next week regarding atrial arrhythmia  - will order 30 day cardiac event monitoring again - Follow up with your primary care physician for stroke risk factor modification. Recommend maintain blood pressure goal <130/80, diabetes with hemoglobin A1c goal below 7.0% and lipids with LDL cholesterol goal below 70 mg/dL.  - will recommend to discontinue amlodipine due to low BP but continue metoprolol for arrhythmia. - check BP at facility and record and  bring over with you for cardiology follow up - continue work with PT/OT in facility  - severe intracranial vessel stenosis, so you need to keep hydrated and avoid low BP - follow up in 3 months.    Rosalin Hawking, MD PhD [facility] [address] [place], [place] [address] [phone]

## 2015-11-06 ENCOUNTER — Encounter: Payer: Self-pay | Admitting: Physician Assistant

## 2015-11-06 ENCOUNTER — Ambulatory Visit (INDEPENDENT_AMBULATORY_CARE_PROVIDER_SITE_OTHER): Payer: Medicare Other

## 2015-11-06 ENCOUNTER — Other Ambulatory Visit: Payer: Self-pay | Admitting: Neurology

## 2015-11-06 ENCOUNTER — Ambulatory Visit (INDEPENDENT_AMBULATORY_CARE_PROVIDER_SITE_OTHER): Payer: Medicare Other | Admitting: Physician Assistant

## 2015-11-06 VITALS — BP 120/60 | HR 62 | Ht 61.0 in | Wt 121.4 lb

## 2015-11-06 DIAGNOSIS — I639 Cerebral infarction, unspecified: Secondary | ICD-10-CM | POA: Diagnosis not present

## 2015-11-06 DIAGNOSIS — I4891 Unspecified atrial fibrillation: Secondary | ICD-10-CM | POA: Diagnosis not present

## 2015-11-06 DIAGNOSIS — R011 Cardiac murmur, unspecified: Secondary | ICD-10-CM | POA: Diagnosis not present

## 2015-11-06 DIAGNOSIS — R42 Dizziness and giddiness: Secondary | ICD-10-CM

## 2015-11-06 DIAGNOSIS — Z09 Encounter for follow-up examination after completed treatment for conditions other than malignant neoplasm: Secondary | ICD-10-CM

## 2015-11-06 DIAGNOSIS — R002 Palpitations: Secondary | ICD-10-CM

## 2015-11-06 DIAGNOSIS — I471 Supraventricular tachycardia: Secondary | ICD-10-CM | POA: Diagnosis not present

## 2015-11-06 LAB — ECHOCARDIOGRAM COMPLETE
Height: 61 in
Weight: 1950.63 oz

## 2015-11-06 NOTE — Patient Instructions (Addendum)
Medication Instructions:  Your physician has recommended you make the following change in your medication:  1.  DO NOT take the Amlodipine if your blood pressure runs 110/50 or lower   Labwork: None ordered  Testing/Procedures: Your physician has recommended that you wear an event monitor. Event monitors are medical devices that record the heart's electrical activity. Doctors most often [place] these monitors to diagnose arrhythmias. Arrhythmias are problems with the speed or rhythm of the heartbeat. The monitor is a small, portable device. You can wear one while you do your normal daily activities. This is usually used to diagnose what is causing palpitations/syncope (passing out).    Follow-Up: Your physician recommends that you schedule a follow-up appointment in: WILL BE BASED UPON RESULTS   Any Other Special Instructions Will Be Listed Below (If Applicable).   Cardiac Event Monitoring A cardiac event monitor is a small recording device used to help detect abnormal heart rhythms (arrhythmias). The monitor is used to record heart rhythm when noticeable symptoms such as the following occur:  Fast heartbeats (palpitations), such as heart racing or fluttering.  Dizziness.  Fainting or light-headedness.  Unexplained weakness. The monitor is wired to two electrodes placed on your chest. Electrodes are flat, sticky disks that attach to your skin. The monitor can be worn for up to 30 days. You will wear the monitor at all times, except when bathing.  HOW TO USE YOUR CARDIAC EVENT MONITOR A technician will prepare your chest for the electrode placement. The technician will show you how to place the electrodes, how to work the monitor, and how to replace the batteries. Take time to practice using the monitor before you leave the office. Make sure you understand how to send the information from the monitor to your health care provider. This requires a telephone with a landline, not a cell phone.  You need to:  Wear your monitor at all times, except when you are in water:  Do not get the monitor wet.  Take the monitor off when bathing. Do not swim or use a hot tub with it on.  Keep your skin clean. Do not put body lotion or moisturizer on your chest.  Change the electrodes daily or any time they stop sticking to your skin. You might need to use tape to keep them on.  It is possible that your skin under the electrodes could become irritated. To keep this from happening, try to put the electrodes in slightly different places on your chest. However, they must remain in the area under your left breast and in the upper right section of your chest.  Make sure the monitor is safely clipped to your clothing or in a location close to your body that your health care provider recommends.  Press the button to record when you feel symptoms of heart trouble, such as dizziness, weakness, light-headedness, palpitations, thumping, shortness of breath, unexplained weakness, or a fluttering or racing heart. The monitor is always on and records what happened slightly before you pressed the button, so do not worry about being too late to get good information.  Keep a diary of your activities, such as walking, doing chores, and taking medicine. It is especially important to note what you were doing when you pushed the button to record your symptoms. This will help your health care provider determine what might be contributing to your symptoms. The information stored in your monitor will be reviewed by your health care provider alongside your diary entries.  Send the recorded information as recommended by your health care provider. It is important to understand that it will take some time for your health care provider to process the results.  Change the batteries as recommended by your health care provider. SEEK IMMEDIATE MEDICAL CARE IF:   You have chest pain.  You have extreme difficulty breathing or  shortness of breath.  You develop a very fast heartbeat that persists.  You develop dizziness that does not go away.  You faint or constantly feel you are about to faint.   This information is not intended to replace advice given to you by your health care provider. Make sure you discuss any questions you have with your health care provider.   Document Released: 12/18/2007 Document Revised: 03/31/2014 Document Reviewed: 09/06/2012 Elsevier Interactive Patient Education [employer].    If you need a refill on your cardiac medications before your next appointment, please call your pharmacy.

## 2015-11-06 NOTE — Addendum Note (Signed)
Addended by: Gaetano Net on: 11/06/2015 03:04 PM   Modules accepted: Orders

## 2015-11-11 ENCOUNTER — Encounter: Payer: Self-pay | Admitting: Gastroenterology

## 2015-11-13 DIAGNOSIS — R2681 Unsteadiness on feet: Secondary | ICD-10-CM | POA: Diagnosis not present

## 2015-11-13 DIAGNOSIS — M6281 Muscle weakness (generalized): Secondary | ICD-10-CM | POA: Diagnosis not present

## 2015-11-14 DIAGNOSIS — R2681 Unsteadiness on feet: Secondary | ICD-10-CM | POA: Diagnosis not present

## 2015-11-14 DIAGNOSIS — M6281 Muscle weakness (generalized): Secondary | ICD-10-CM | POA: Diagnosis not present

## 2015-11-15 ENCOUNTER — Non-Acute Institutional Stay: Payer: Medicare Other | Admitting: Internal Medicine

## 2015-11-15 ENCOUNTER — Encounter: Payer: Self-pay | Admitting: Internal Medicine

## 2015-11-15 DIAGNOSIS — R002 Palpitations: Secondary | ICD-10-CM

## 2015-11-15 DIAGNOSIS — I10 Essential (primary) hypertension: Secondary | ICD-10-CM

## 2015-11-15 DIAGNOSIS — M353 Polymyalgia rheumatica: Secondary | ICD-10-CM | POA: Diagnosis not present

## 2015-11-15 DIAGNOSIS — R42 Dizziness and giddiness: Secondary | ICD-10-CM | POA: Diagnosis not present

## 2015-11-15 DIAGNOSIS — R58 Hemorrhage, not elsewhere classified: Secondary | ICD-10-CM

## 2015-11-15 DIAGNOSIS — G479 Sleep disorder, unspecified: Secondary | ICD-10-CM

## 2015-11-15 DIAGNOSIS — R7303 Prediabetes: Secondary | ICD-10-CM | POA: Diagnosis not present

## 2015-11-15 DIAGNOSIS — I63521 Cerebral infarction due to unspecified occlusion or stenosis of right anterior cerebral artery: Secondary | ICD-10-CM | POA: Diagnosis not present

## 2015-11-15 HISTORY — DX: Hemorrhage, not elsewhere classified: R58

## 2015-11-15 NOTE — Progress Notes (Signed)
History and Physical    Provider:  Dr. Jeanmarie Hubert Location:   [place] Room Number: [place] of Service:  ALF [phone])  PCP: Jeanmarie Hubert, MD Patient Care Team: [name], MD as PCP - General (Internal Medicine) [name], MD as Consulting Physician (Cardiology) [name], MD as Consulting Physician (Ophthalmology) [name], MD as Consulting Physician (Gastroenterology) [name], MD as Consulting Physician (Dermatology)  Extended Emergency Contact Information Primary Emergency Contact: [name]) Address: [place], [place] [address] [name] of [place] Phone: [phone] Mobile Phone: [phone] Relation: Brother Secondary Emergency Contact: [name] [place] of [place] Phone: [phone] Relation: Sister  Code Status: DNR Goals of Care: Advanced Directive information Advanced Directives 11/15/2015  Does patient have an advance directive? Yes  Type of Paramedic of [place];Living will;Out of facility DNR (pink MOST or yellow form)  Does patient want to make changes to advanced directive? -  Copy of advanced directive(s) in chart? Yes      Chief Complaint  Patient presents with  . Medical Management of Chronic Issues    new admit to AL transferred from SNF    HPI: Patient is a 75 y.o. female seen today for admission on 11/07/14 to patient's chart, [facility] assisted living unit via transfer from the [facility] SNF.  This is a transition of care note.  Patient had an acute CVA for which she was hospitalized and then admitted to the SNF at [facility]. Initially she was weak, had a severe gait disturbance with disequilibrium, and complained of visual disturbance. She has gradually improved with physical therapy. Patient previously was living in the assisted living area at [facility], but she can no  longer manage her medications and it appears that she may need to stay in the assisted living area for adequate assistance.  Gait disturbance has improved a great deal. She previously required somebody to be with her and hold onto her as she walked in order to prevent falling area she is now walking independently and with only a slight disturbance in gait. She feels comfortable and safe walking by herself.  Her vision remains quite poor. She has seen her ophthalmologist, Dr. Kathrin Penner. It appears that nothing more can be done to be helpful with her vision.  New problem that has appeared since moving to assisted living is on multiple ecchymoses on the posterior legs and buttocks. She is always "bruised easily" but the current ecchymoses are much larger than she has ever had in the past. She is currently getting both aspirin and Plavix.  She carries a previous diagnosis of polymyalgia rheumatica, for which she remains on low-dose steroid. Currently she is doing well little problem related to myalgias or arthralgias.  She has had a previous diagnosis of ulcerative colitis. Stools are normal at this time. She takes mesalamine for this problem.  Blood pressure is well controlled.  Problems of GERD are controlled.  Previous problems with insomnia seem improved.  Past Medical History:  Diagnosis Date  . Colitis   . GERD (gastroesophageal reflux disease)   . Heart murmur   . Hyperlipidemia   . Hypertension   . Osteopenia    s/p 7 yrs Fosamax  . Polio   . Polymyalgia ([place])   . Rheumatic fever   . Sleep disorder   . Stroke ([place])   .  Ulcerative colitis    Past Surgical History:  Procedure Laterality Date  . BREAST LUMPECTOMY  1986   left; benign  . CATARACT EXTRACTION     Left eye   . COLONOSCOPY  2006  . Colonosopy  07/05/13   Dr. Deatra Ina  . DILATION AND CURETTAGE OF UTERUS  12/1999  . MELANOMA EXCISION  2007   left arm  . NECK SURGERY  1986   Benign growth on neck removed  .  TONSILLECTOMY      reports that she has never smoked. She has never used smokeless tobacco. She reports that she drinks alcohol. She reports that she does not use drugs. Social History   Social History  . Marital status: Single    Spouse name: N/A  . Number of children: N/A  . Years of education: N/A   Occupational History  . retired    Social History Main Topics  . Smoking status: Never Smoker  . Smokeless tobacco: Never Used  . Alcohol use Yes     Comment: occasional  . Drug use: No  . Sexual activity: No   Other Topics Concern  . Not on file   Social History Narrative   Daily Caffeine use- soda (diet)      Do you drink/ eat things with caffeine? Yes, sometimes      Marital status:    Never                            Lives at [facility], transfer to skill unit 09/26/15, transfer to AL 11/09/15      Do you have any pets in your home ?(please list) No      Current or past profession: Geophysicist/field seismologist for Social worker      Do you exercise?    No                             Living Will, POA, DNR, MOST    Functional Status Survey: Is the patient deaf or have difficulty hearing?: Yes Does the patient have difficulty seeing, even when wearing glasses/contacts?: Yes Does the patient have difficulty concentrating, remembering, or making decisions?: Yes Does the patient have difficulty walking or climbing stairs?: Yes Does the patient have difficulty dressing or bathing?: Yes Does the patient have difficulty doing errands alone such as visiting a doctor's office or shopping?: Yes  Family History  Problem Relation Age of Onset  . Pancreatic cancer Other   . Other Mother     Coad/ Bronchiectosis  . Diabetes Paternal Grandmother   . Heart failure Paternal Grandmother   . Cancer Paternal Aunt     unaware of primary site  . Coronary artery disease Neg Hx   . Colon cancer Neg Hx   . Rectal cancer Neg Hx   . Stomach cancer Neg Hx   . Heart attack Neg Hx     Health  Maintenance  Topic Date Due  . ZOSTAVAX  11/19/1999  . MAMMOGRAM  07/18/2015  . INFLUENZA VACCINE  10/23/2015  . TETANUS/TDAP  12/21/2021  . DEXA SCAN  Completed  . PNA vac Low Risk Adult  Completed    No Known Allergies    Medication List       Accurate as of 11/15/15  2:18 PM. Always use your most recent med list.          acetaminophen 325  MG tablet Commonly known as:  TYLENOL Take 650 mg by mouth every 6 (six) hours as needed for mild pain or headache.   aspirin 325 MG tablet Take 1 tablet (325 mg total) by mouth daily. Cont ASA and plavix for 3 months, then plavix daily   atorvastatin 10 MG tablet Commonly known as:  LIPITOR Take 1 tablet (10 mg total) by mouth daily at 6 PM.   benazepril-hydrochlorthiazide 20-12.5 MG tablet Commonly known as:  LOTENSIN HCT Take 0.5 tablets by mouth daily.   CALCIUM + D PO Take 1 tablet by mouth. Take 2 tablets three times daily   clopidogrel 75 MG tablet Commonly known as:  PLAVIX Take 1 tablet (75 mg total) by mouth daily.   dimenhyDRINATE 50 MG tablet Commonly known as:  DRAMAMINE Take 1 tablet every 6 hours as needed for nausea or dizziness   fluticasone 50 MCG/ACT nasal spray Commonly known as:  FLONASE Place 1 spray into both nostrils daily as needed for allergies or rhinitis. Reported on 09/28/2015   hydroxypropyl methylcellulose / hypromellose 2.5 % ophthalmic solution Commonly known as:  ISOPTO TEARS / GONIOVISC Place 1 drop into both eyes as needed for dry eyes.   mesalamine 1.2 g EC tablet Commonly known as:  LIALDA take 2 tablets by mouth daily with BREAKFAST   metoprolol tartrate 25 MG tablet Commonly known as:  LOPRESSOR Take 1 tablet (25 mg total) by mouth 2 (two) times daily.   multivitamin tablet Take 1 tablet by mouth daily.   predniSONE 5 MG tablet Commonly known as:  DELTASONE TWO TABLETs DAILY TO TREAT POLYMYALGIA RHEUMATICA   ranitidine 150 MG tablet Commonly known as:  ZANTAC Take 150 mg  by mouth. Take one tablet at bedtime   sennosides-docusate sodium 8.6-50 MG tablet Commonly known as:  SENOKOT-S Take 1 tablet by mouth. Take one tablet three times daily for constipation-hold for loose stools       Review of Systems  Constitutional: Negative for activity change, appetite change, chills, diaphoresis, fatigue, fever and unexpected weight change.  HENT: Negative for congestion, ear discharge, ear pain, hearing loss, postnasal drip, rhinorrhea, sore throat, tinnitus, trouble swallowing and voice change.   Eyes: Positive for visual disturbance (corrective lenses). Negative for pain, redness and itching.  Respiratory: Negative for cough, choking, shortness of breath and wheezing.   Cardiovascular: Negative for chest pain, palpitations and leg swelling.  Gastrointestinal: Positive for diarrhea, nausea and vomiting. Negative for abdominal distention, abdominal pain and constipation.       Hx ulcerative colitis; on Lialda.  Endocrine: Negative for cold intolerance, heat intolerance, polydipsia, polyphagia and polyuria.       History of elevated glucose.  Genitourinary: Negative for difficulty urinating, dysuria, flank pain, frequency, hematuria, pelvic pain, urgency and vaginal discharge.  Musculoskeletal: Positive for back pain and gait problem. Negative for arthralgias, myalgias, neck pain and neck stiffness.       Hx PMR and chronic use of prednisone. History of osteopenia and kyphosis  Skin: Negative for color change, pallor and rash.  Allergic/Immunologic: Negative.   Neurological: Positive for dizziness and light-headedness. Negative for tremors, seizures, syncope, weakness, numbness and headaches.       History of poliomyelitis at age 51. History of CVA.09/21/15.  Hematological: Negative for adenopathy. Does not bruise/bleed easily.  Psychiatric/Behavioral: Negative for agitation, behavioral problems, confusion, dysphoric mood, hallucinations, sleep disturbance and  suicidal ideas. The patient is not nervous/anxious and is not hyperactive.     Vitals:  11/15/15 1356  BP: 139/80  Pulse: 72  Resp: 18  Temp: 98.2 F (36.8 C)  Weight: 117 lb (53.1 kg)  Height: 5\' 1"  (1.549 m)   Body mass index is 22.11 kg/m. Physical Exam  Constitutional: She is oriented to person, place, and time. She appears well-developed and well-nourished. No distress.  HENT:  Right Ear: External ear normal.  Left Ear: External ear normal.  Nose: Nose normal.  Mouth/Throat: Oropharynx is clear and moist. No oropharyngeal exudate.  Wearing hearing aids bilaterally. EAC and TM normal bilaterally.  Eyes: Conjunctivae and EOM are normal. Pupils are equal, round, and reactive to light. No scleral icterus.  Prescription lenses  Neck: No JVD present. No tracheal deviation present. No thyromegaly present.  Cardiovascular: Normal rate, regular rhythm, normal heart sounds and intact distal pulses.  Exam reveals no gallop and no friction rub.   No murmur heard. Pulmonary/Chest: Effort normal. No respiratory distress. She has no wheezes. She has no rales. She exhibits no tenderness.  Abdominal: Soft. Bowel sounds are normal. She exhibits no distension and no mass. There is no tenderness.  Large diastasis recti  Musculoskeletal: Normal range of motion. She exhibits no edema or tenderness.  Kyphosis.. Miild gait instability.  Lymphadenopathy:    She has no cervical adenopathy.  Neurological: She is alert and oriented to person, place, and time. No cranial nerve deficit. Coordination normal.  Mild tremor of extended arms  Skin: No rash noted. She is not diaphoretic. No erythema. No pallor.  Large ecchymoses present on the posterior leg and buttocks. No known trauma.  Psychiatric: She has a normal mood and affect. Her behavior is normal. Judgment and thought content normal.    Labs reviewed: Basic Metabolic Panel:  Recent Labs  09/22/15 0832  09/25/15 0531 09/25/15 1221  09/26/15 0548 09/28/15 1636  NA 133*  < > 135  --  134* 132*  K 3.7  < > 3.7  --  4.3 4.4  CL 97*  < > 99*  --  95* 93*  CO2 25  < > 27  --  30 31  GLUCOSE 123*  < > 120*  --  94 145*  BUN 19  < > 13  --  20 33*  CREATININE 1.07*  < > 1.09*  --  1.12* 1.23*  CALCIUM 8.5*  < > 9.3  --  9.3 9.9  MG 2.1  --   --  2.2 2.3  --   < > = values in this interval not displayed. Liver Function Tests:  Recent Labs  04/26/15  AST 21  ALT 26  ALKPHOS 105   No results for input(s): LIPASE, AMYLASE in the last 8760 hours. No results for input(s): AMMONIA in the last 8760 hours. CBC:  Recent Labs  09/22/15 0832 09/24/15 0539 09/25/15 1221 09/28/15 1636  WBC 8.7 4.2 9.8 7.6  NEUTROABS 7.7  --   --  6.9  HGB 11.8* 11.2* 12.4 12.7  HCT 37.4 36.1 39.0 40.3  MCV 91.2 93.0 91.3 93.3  PLT 178 127* 148* 212   Cardiac Enzymes: No results for input(s): CKTOTAL, CKMB, CKMBINDEX, TROPONINI in the last 8760 hours. BNP: Invalid input(s): POCBNP Lab Results  Component Value Date   HGBA1C 6.0 08/23/2015   Lab Results  Component Value Date   TSH 2.008 09/25/2015   Lab Results  Component Value Date   VITAMINB12 254 05/17/2014   No results found for: FOLATE Lab Results  Component Value Date   IRON  43 12/07/2014     Assessment/Plan 1. 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 She is encouraged to continue walking in order to maintain strength.  2. Disequilibrium Improved Encouraged continue walking independently  3. Essential hypertension Controlled  4. Polymyalgia rheumatica Controlled  5. Pre-diabetes Follow-up BMP  6. Palpitations Improved  7. Disturbance in sleep behavior Improved  8. Ecchymoses Discontinue aspirin CBC with platelet count, INR, protein C, protein S, antithrombin III, anti-cardiolipin antibody

## 2015-11-16 DIAGNOSIS — R2681 Unsteadiness on feet: Secondary | ICD-10-CM | POA: Diagnosis not present

## 2015-11-16 DIAGNOSIS — M6281 Muscle weakness (generalized): Secondary | ICD-10-CM | POA: Diagnosis not present

## 2015-11-19 ENCOUNTER — Encounter: Payer: Self-pay | Admitting: Internal Medicine

## 2015-11-19 DIAGNOSIS — R58 Hemorrhage, not elsewhere classified: Secondary | ICD-10-CM | POA: Diagnosis not present

## 2015-11-19 DIAGNOSIS — M6281 Muscle weakness (generalized): Secondary | ICD-10-CM | POA: Diagnosis not present

## 2015-11-19 DIAGNOSIS — R2681 Unsteadiness on feet: Secondary | ICD-10-CM | POA: Diagnosis not present

## 2015-11-20 DIAGNOSIS — R2681 Unsteadiness on feet: Secondary | ICD-10-CM | POA: Diagnosis not present

## 2015-11-20 DIAGNOSIS — M6281 Muscle weakness (generalized): Secondary | ICD-10-CM | POA: Diagnosis not present

## 2015-11-22 ENCOUNTER — Non-Acute Institutional Stay: Payer: Medicare Other | Admitting: Nurse Practitioner

## 2015-11-22 ENCOUNTER — Encounter: Payer: Self-pay | Admitting: Nurse Practitioner

## 2015-11-22 DIAGNOSIS — M6281 Muscle weakness (generalized): Secondary | ICD-10-CM | POA: Diagnosis not present

## 2015-11-22 DIAGNOSIS — R5381 Other malaise: Secondary | ICD-10-CM | POA: Diagnosis not present

## 2015-11-22 DIAGNOSIS — M353 Polymyalgia rheumatica: Secondary | ICD-10-CM | POA: Diagnosis not present

## 2015-11-22 DIAGNOSIS — K5100 Ulcerative (chronic) pancolitis without complications: Secondary | ICD-10-CM

## 2015-11-22 DIAGNOSIS — I639 Cerebral infarction, unspecified: Secondary | ICD-10-CM

## 2015-11-22 DIAGNOSIS — R2681 Unsteadiness on feet: Secondary | ICD-10-CM | POA: Diagnosis not present

## 2015-11-22 DIAGNOSIS — I471 Supraventricular tachycardia: Secondary | ICD-10-CM | POA: Diagnosis not present

## 2015-11-22 DIAGNOSIS — K219 Gastro-esophageal reflux disease without esophagitis: Secondary | ICD-10-CM | POA: Diagnosis not present

## 2015-11-22 DIAGNOSIS — I10 Essential (primary) hypertension: Secondary | ICD-10-CM | POA: Diagnosis not present

## 2015-11-22 DIAGNOSIS — R350 Frequency of micturition: Secondary | ICD-10-CM | POA: Diagnosis not present

## 2015-11-22 DIAGNOSIS — E871 Hypo-osmolality and hyponatremia: Secondary | ICD-10-CM | POA: Diagnosis not present

## 2015-11-22 NOTE — Progress Notes (Signed)
Location:   [place] Room Number: 31 Place of Service:  ALF [phone]) Provider:  Jaegar Croft, [place]  NP  Jeanmarie Hubert, MD  Patient Care Team: [name], MD as PCP - General (Internal Medicine) [name], MD as Consulting Physician (Cardiology) [name], MD as Consulting Physician (Ophthalmology) [name], MD as Consulting Physician (Gastroenterology) [name], MD as Consulting Physician (Dermatology)  Extended Emergency Contact Information Primary Emergency Contact: [name]) Address: [place], [place] [address] Tammy Beard of [place] Phone: [phone] Mobile Phone: [phone] Relation: Brother Secondary Emergency Contact: [name] [place] of [place] Phone: [phone] Relation: Sister  Code Status:  DNR Goals of care: Advanced Directive information Advanced Directives 11/22/2015  Does patient have an advance directive? Yes  Type of Paramedic of [place];Living will;Out of facility DNR (pink MOST or yellow form)  Does patient want to make changes to advanced directive? No - Patient declined  Copy of advanced directive(s) in chart? Yes     Chief Complaint  Patient presents with  . Acute Visit    Headaches in the Am elevated BP     HPI:  Pt is a 75 y.o. female seen today for an acute visit for generalized weakness, walk slower, eat less, HA in am associated with elevated Bp-better after am meds, increased urinary frequency-up to 3-4x/night, denied dysuria, Hx of polymyalgia taking Prednisone 5mg  daily, blood pressure is controlled on Metorprolol 25mg  bid, Lotensin/HCT 20/12.5mg , ulcerative colitis, stable on Lialda 1.2gm, Senokot S I. GERD stable on Ranitidine 150mg , she takes Plavix, Lipitor, ASA for risk reduction. Currently she is on heart monitor.    Past Medical History:  Diagnosis Date  . Colitis   . Ecchymosis 11/15/2015  . GERD  (gastroesophageal reflux disease)   . Heart murmur   . Hyperlipidemia   . Hypertension   . Osteopenia    s/p 7 yrs Fosamax  . Polio   . Polymyalgia ([place])   . Rheumatic fever   . Sleep disorder   . Stroke ([place])   . Ulcerative colitis    Past Surgical History:  Procedure Laterality Date  . BREAST LUMPECTOMY  1986   left; benign  . CATARACT EXTRACTION     Left eye   . COLONOSCOPY  2006  . Colonosopy  07/05/13   Dr. Deatra Ina  . DILATION AND CURETTAGE OF UTERUS  12/1999  . MELANOMA EXCISION  2007   left arm  . NECK SURGERY  1986   Benign growth on neck removed  . TONSILLECTOMY      No Known Allergies    Medication List       Accurate as of 11/22/15  5:06 PM. Always use your most recent med list.          aspirin 325 MG tablet Take 1 tablet (325 mg total) by mouth daily. Cont ASA and plavix for 3 months, then plavix daily   atorvastatin 10 MG tablet Commonly known as:  LIPITOR Take 1 tablet (10 mg total) by mouth daily at 6 PM.   benazepril-hydrochlorthiazide 20-12.5 MG tablet Commonly known as:  LOTENSIN HCT Take 0.5 tablets by mouth daily.   CALCIUM 600+D PLUS MINERALS 600-400 MG-UNIT Tabs Take 2 tablets by mouth 3 (three) times daily.   clopidogrel 75 MG tablet Commonly known as:  PLAVIX Take 1 tablet (75 mg total) by mouth daily.   dimenhyDRINATE 50  MG tablet Commonly known as:  DRAMAMINE Take 1 tablet every 6 hours as needed for nausea or dizziness   fluticasone 50 MCG/ACT nasal spray Commonly known as:  FLONASE Place 1 spray into both nostrils daily as needed for allergies or rhinitis. Reported on 09/28/2015   hydroxypropyl methylcellulose / hypromellose 2.5 % ophthalmic solution Commonly known as:  ISOPTO TEARS / GONIOVISC Place 1 drop into both eyes as needed for dry eyes.   mesalamine 1.2 g EC tablet Commonly known as:  LIALDA take 2 tablets by mouth daily with BREAKFAST   metoprolol tartrate 25 MG tablet Commonly known as:  LOPRESSOR Take 1  tablet (25 mg total) by mouth 2 (two) times daily.   multivitamin tablet Take 1 tablet by mouth daily.   predniSONE 5 MG tablet Commonly known as:  DELTASONE TWO TABLETs DAILY TO TREAT POLYMYALGIA RHEUMATICA   ranitidine 150 MG tablet Commonly known as:  ZANTAC Take 150 mg by mouth. Take one tablet at bedtime   sennosides-docusate sodium 8.6-50 MG tablet Commonly known as:  SENOKOT-S Take 1 tablet by mouth. Take one tablet three times daily for constipation-hold for loose stools       Review of Systems  Constitutional: Positive for activity change, appetite change and fatigue. Negative for chills, diaphoresis, fever and unexpected weight change.  HENT: Negative for congestion, ear discharge, ear pain, hearing loss, postnasal drip, rhinorrhea, sore throat, tinnitus, trouble swallowing and voice change.   Eyes: Positive for visual disturbance (corrective lenses). Negative for pain, redness and itching.  Respiratory: Negative for cough, choking, shortness of breath and wheezing.   Cardiovascular: Negative for chest pain, palpitations and leg swelling.  Gastrointestinal: Positive for diarrhea, nausea and vomiting. Negative for abdominal distention, abdominal pain and constipation.       Hx ulcerative colitis; on Lialda.  Endocrine: Negative for cold intolerance, heat intolerance, polydipsia, polyphagia and polyuria.       History of elevated glucose.  Genitourinary: Negative for difficulty urinating, dysuria, flank pain, frequency, hematuria, pelvic pain, urgency and vaginal discharge.  Musculoskeletal: Positive for back pain and gait problem. Negative for arthralgias, myalgias, neck pain and neck stiffness.       Hx PMR and chronic use of prednisone. History of osteopenia and kyphosis  Skin: Negative for color change, pallor and rash.  Allergic/Immunologic: Negative.   Neurological: Positive for dizziness and light-headedness. Negative for tremors, seizures, syncope, weakness,  numbness and headaches.       History of poliomyelitis at age 7. History of CVA.09/21/15.  Hematological: Negative for adenopathy. Does not bruise/bleed easily.  Psychiatric/Behavioral: Negative for agitation, behavioral problems, confusion, dysphoric mood, hallucinations, sleep disturbance and suicidal ideas. The patient is not nervous/anxious and is not hyperactive.     Immunization History  Administered Date(s) Administered  . Hep A / Hep B 06/08/2013, 06/15/2013, 06/29/2013, 06/12/2014  . Influenza Split 01/01/2014  . Influenza Whole 01/17/2009, 12/02/2010  . Influenza-Unspecified 12/22/2012, 12/22/2013, 12/21/2014  . Pneumococcal Conjugate-13 01/27/2015  . Pneumococcal Polysaccharide-23 07/25/2009  . Td 07/25/2009  . Tdap 12/22/2011   Pertinent  Health Maintenance Due  Topic Date Due  . MAMMOGRAM  07/18/2015  . INFLUENZA VACCINE  10/23/2015  . DEXA SCAN  Completed  . PNA vac Low Risk Adult  Completed   Fall Risk  11/02/2015 05/01/2015 01/23/2015 07/20/2014 04/20/2013  Falls in the past year? No No No No No   Functional Status Survey:    Vitals:   11/22/15 1212  BP: (!) 198/94  Pulse: 76  Resp: 17  Temp: 98 F (36.7 C)  Weight: 118 lb 12.8 oz (53.9 kg)  Height: 5\' 1"  (1.549 m)   Body mass index is 22.45 kg/m. Physical Exam  Constitutional: She is oriented to person, place, and time. She appears well-developed and well-nourished. No distress.  HENT:  Right Ear: External ear normal.  Left Ear: External ear normal.  Nose: Nose normal.  Mouth/Throat: Oropharynx is clear and moist. No oropharyngeal exudate.  Wearing hearing aids bilaterally. EAC and TM normal bilaterally.  Eyes: Conjunctivae and EOM are normal. Pupils are equal, round, and reactive to light. No scleral icterus.  Prescription lenses  Neck: No JVD present. No tracheal deviation present. No thyromegaly present.  Cardiovascular: Normal rate, regular rhythm, normal heart sounds and intact distal pulses.   Exam reveals no gallop and no friction rub.   No murmur heard. Pulmonary/Chest: Effort normal. No respiratory distress. She has no wheezes. She has no rales. She exhibits no tenderness.  Abdominal: Soft. Bowel sounds are normal. She exhibits no distension and no mass. There is no tenderness.  Large diastasis recti  Musculoskeletal: Normal range of motion. She exhibits no edema or tenderness.  Kyphosis.. Miild gait instability.  Lymphadenopathy:    She has no cervical adenopathy.  Neurological: She is alert and oriented to person, place, and time. No cranial nerve deficit. Coordination normal.  Mild tremor of extended arms  Skin: No rash noted. She is not diaphoretic. No erythema. No pallor.  Large ecchymoses present on the posterior leg and buttocks. No known trauma.  Psychiatric: She has a normal mood and affect. Her behavior is normal. Judgment and thought content normal.    Labs reviewed:  Recent Labs  09/22/15 0832  09/25/15 0531 09/25/15 1221 09/26/15 0548 09/28/15 1636  NA 133*  < > 135  --  134* 132*  K 3.7  < > 3.7  --  4.3 4.4  CL 97*  < > 99*  --  95* 93*  CO2 25  < > 27  --  30 31  GLUCOSE 123*  < > 120*  --  94 145*  BUN 19  < > 13  --  20 33*  CREATININE 1.07*  < > 1.09*  --  1.12* 1.23*  CALCIUM 8.5*  < > 9.3  --  9.3 9.9  MG 2.1  --   --  2.2 2.3  --   < > = values in this interval not displayed.  Recent Labs  04/26/15  AST 21  ALT 26  ALKPHOS 105    Recent Labs  09/22/15 0832 09/24/15 0539 09/25/15 1221 09/28/15 1636  WBC 8.7 4.2 9.8 7.6  NEUTROABS 7.7  --   --  6.9  HGB 11.8* 11.2* 12.4 12.7  HCT 37.4 36.1 39.0 40.3  MCV 91.2 93.0 91.3 93.3  PLT 178 127* 148* 212   Lab Results  Component Value Date   TSH 2.008 09/25/2015   Lab Results  Component Value Date   HGBA1C 6.0 08/23/2015   Lab Results  Component Value Date   CHOL 180 08/23/2015   HDL 73 (A) 08/23/2015   LDLCALC 63 08/23/2015   TRIG 222 (A) 08/23/2015   CHOLHDL 3  11/14/2008    Significant Diagnostic Results in last 30 days:  No results found.  Assessment/Plan There are no diagnoses linked to this encounter.Essential hypertension Blood pressure is controlled, continue Metoprolol 25mg  bid, Lotensin/HCT 20/12.5mg  daily. May switch Lotensin/HCT to hs related elevated Bp in am.  Cerebrovascular accident (CVA) ([place]) Continue Plavis, ASA, lipitor.   Atrial tachycardia (HCC) Heart rate is in control upon my examination today, currently she is on heart monitor, f/u cardiology. Continue Metoprolol 25mg  bid.   Ulcerative colitis ([place]) Left-sided colitis, in remission, on lialda, diagnosed 2006 Continue Lialda.   Urinary frequency Increased urinary frequency and incontinence, UA C/S  Polymyalgia rheumatica (HCC) Managed with Prednisone 5mg  daily  GERD (gastroesophageal reflux disease) Stable, continue Ranitidine.   Malaise The patient c/o walk slower, eat less, feel tired, denied chest, abd or lower back pain, denied dysuria, she is afebrile. Obtain CBC, CMP, UA C/S  Hyponatremia 09/28/15 Na 132, update CMP     Family/ staff Communication: continue AL for care assistance.    Labs/tests ordered:  CBC, CMP, UA C/S

## 2015-11-22 NOTE — Assessment & Plan Note (Signed)
Continue Plavis, ASA, lipitor.

## 2015-11-22 NOTE — Assessment & Plan Note (Signed)
The patient c/o walk slower, eat less, feel tired, denied chest, abd or lower back pain, denied dysuria, she is afebrile. Obtain CBC, CMP, UA C/S

## 2015-11-22 NOTE — Assessment & Plan Note (Signed)
Left-sided colitis, in remission, on lialda, diagnosed 2006 Continue Lialda.

## 2015-11-22 NOTE — Assessment & Plan Note (Signed)
09/28/15 Na 132, update CMP

## 2015-11-22 NOTE — Assessment & Plan Note (Signed)
Heart rate is in control upon my examination today, currently she is on heart monitor, f/u cardiology. Continue Metoprolol 25mg  bid.

## 2015-11-22 NOTE — Assessment & Plan Note (Signed)
Stable, continue Ranitidine.  

## 2015-11-22 NOTE — Assessment & Plan Note (Signed)
Blood pressure is controlled, continue Metoprolol 25mg  bid, Lotensin/HCT 20/12.5mg  daily. May switch Lotensin/HCT to hs related elevated Bp in am.

## 2015-11-22 NOTE — Assessment & Plan Note (Signed)
Increased urinary frequency and incontinence, UA C/S

## 2015-11-22 NOTE — Assessment & Plan Note (Signed)
Managed with Prednisone 5mg daily 

## 2015-11-23 DIAGNOSIS — D649 Anemia, unspecified: Secondary | ICD-10-CM | POA: Diagnosis not present

## 2015-11-23 DIAGNOSIS — I639 Cerebral infarction, unspecified: Secondary | ICD-10-CM | POA: Diagnosis not present

## 2015-11-23 DIAGNOSIS — I10 Essential (primary) hypertension: Secondary | ICD-10-CM | POA: Diagnosis not present

## 2015-11-23 DIAGNOSIS — M6281 Muscle weakness (generalized): Secondary | ICD-10-CM | POA: Diagnosis not present

## 2015-11-23 DIAGNOSIS — R319 Hematuria, unspecified: Secondary | ICD-10-CM | POA: Diagnosis not present

## 2015-11-25 ENCOUNTER — Encounter (HOSPITAL_COMMUNITY): Payer: Self-pay | Admitting: Emergency Medicine

## 2015-11-25 ENCOUNTER — Emergency Department (HOSPITAL_COMMUNITY): Payer: Medicare Other

## 2015-11-25 ENCOUNTER — Emergency Department (HOSPITAL_COMMUNITY)
Admission: EM | Admit: 2015-11-25 | Discharge: 2015-11-25 | Disposition: A | Discharge status: Home / Self Care | Payer: Medicare Other | Source: Home / Self Care | Attending: Emergency Medicine | Admitting: Emergency Medicine

## 2015-11-25 DIAGNOSIS — Z7982 Long term (current) use of aspirin: Secondary | ICD-10-CM

## 2015-11-25 DIAGNOSIS — I10 Essential (primary) hypertension: Secondary | ICD-10-CM | POA: Insufficient documentation

## 2015-11-25 DIAGNOSIS — E871 Hypo-osmolality and hyponatremia: Secondary | ICD-10-CM | POA: Diagnosis not present

## 2015-11-25 DIAGNOSIS — G9341 Metabolic encephalopathy: Secondary | ICD-10-CM | POA: Diagnosis not present

## 2015-11-25 DIAGNOSIS — Z8673 Personal history of transient ischemic attack (TIA), and cerebral infarction without residual deficits: Secondary | ICD-10-CM

## 2015-11-25 DIAGNOSIS — A4150 Gram-negative sepsis, unspecified: Secondary | ICD-10-CM | POA: Diagnosis not present

## 2015-11-25 DIAGNOSIS — R4182 Altered mental status, unspecified: Secondary | ICD-10-CM | POA: Insufficient documentation

## 2015-11-25 DIAGNOSIS — N179 Acute kidney failure, unspecified: Secondary | ICD-10-CM | POA: Diagnosis not present

## 2015-11-25 DIAGNOSIS — R42 Dizziness and giddiness: Secondary | ICD-10-CM | POA: Diagnosis not present

## 2015-11-25 DIAGNOSIS — J439 Emphysema, unspecified: Secondary | ICD-10-CM | POA: Diagnosis not present

## 2015-11-25 DIAGNOSIS — K5190 Ulcerative colitis, unspecified, without complications: Secondary | ICD-10-CM | POA: Diagnosis not present

## 2015-11-25 DIAGNOSIS — I5042 Chronic combined systolic (congestive) and diastolic (congestive) heart failure: Secondary | ICD-10-CM | POA: Diagnosis not present

## 2015-11-25 DIAGNOSIS — J9811 Atelectasis: Secondary | ICD-10-CM | POA: Diagnosis not present

## 2015-11-25 DIAGNOSIS — N390 Urinary tract infection, site not specified: Secondary | ICD-10-CM | POA: Insufficient documentation

## 2015-11-25 DIAGNOSIS — Z79899 Other long term (current) drug therapy: Secondary | ICD-10-CM | POA: Insufficient documentation

## 2015-11-25 DIAGNOSIS — R402411 Glasgow coma scale score 13-15, in the field [EMT or ambulance]: Secondary | ICD-10-CM | POA: Diagnosis not present

## 2015-11-25 LAB — URINALYSIS, ROUTINE W REFLEX MICROSCOPIC
Bilirubin Urine: NEGATIVE
GLUCOSE, UA: NEGATIVE mg/dL
KETONES UR: 15 mg/dL — AB
NITRITE: POSITIVE — AB
PH: 7.5 (ref 5.0–8.0)
PROTEIN: 100 mg/dL — AB
Specific Gravity, Urine: 1.015 (ref 1.005–1.030)

## 2015-11-25 LAB — URINE MICROSCOPIC-ADD ON

## 2015-11-25 LAB — CBC
HEMATOCRIT: 39.3 % (ref 36.0–46.0)
Hemoglobin: 13 g/dL (ref 12.0–15.0)
MCH: 29.8 pg (ref 26.0–34.0)
MCHC: 33.1 g/dL (ref 30.0–36.0)
MCV: 90.1 fL (ref 78.0–100.0)
PLATELETS: 185 10*3/uL (ref 150–400)
RBC: 4.36 MIL/uL (ref 3.87–5.11)
RDW: 12.6 % (ref 11.5–15.5)
WBC: 9.9 10*3/uL (ref 4.0–10.5)

## 2015-11-25 LAB — COMPREHENSIVE METABOLIC PANEL
ALT: 20 U/L (ref 14–54)
AST: 31 U/L (ref 15–41)
Albumin: 4.1 g/dL (ref 3.5–5.0)
Alkaline Phosphatase: 75 U/L (ref 38–126)
Anion gap: 12 (ref 5–15)
BILIRUBIN TOTAL: 1 mg/dL (ref 0.3–1.2)
BUN: 17 mg/dL (ref 6–20)
CHLORIDE: 87 mmol/L — AB (ref 101–111)
CO2: 27 mmol/L (ref 22–32)
Calcium: 9.1 mg/dL (ref 8.9–10.3)
Creatinine, Ser: 1.03 mg/dL — ABNORMAL HIGH (ref 0.44–1.00)
GFR, EST AFRICAN AMERICAN: 60 mL/min — AB (ref >60)
GFR, EST NON AFRICAN AMERICAN: 51 mL/min — AB (ref >60)
Glucose, Bld: 139 mg/dL — ABNORMAL HIGH (ref 65–99)
POTASSIUM: 3.5 mmol/L (ref 3.5–5.1)
Sodium: 126 mmol/L — ABNORMAL LOW (ref 135–145)
TOTAL PROTEIN: 7.5 g/dL (ref 6.5–8.1)

## 2015-11-25 MED ORDER — SODIUM CHLORIDE 0.9 % IV BOLUS (SEPSIS)
500.0000 mL | Freq: Once | INTRAVENOUS | Status: AC
  Administered 2015-11-25: 500 mL via INTRAVENOUS

## 2015-11-25 MED ORDER — LEVOFLOXACIN IN D5W 750 MG/150ML IV SOLN
750.0000 mg | Freq: Once | INTRAVENOUS | Status: AC
  Administered 2015-11-25: 750 mg via INTRAVENOUS
  Filled 2015-11-25: qty 150

## 2015-11-25 MED ORDER — LEVOFLOXACIN 750 MG PO TABS: 750.0000 mg | ORAL_TABLET | Freq: Every day | ORAL | 0 refills | Status: DC

## 2015-11-25 NOTE — ED Triage Notes (Signed)
Per facility pt has "not been acting right" [facility]. EMS reported facility reported high heart rate and BP

## 2015-11-25 NOTE — ED Provider Notes (Signed)
[facility] DEPT Provider Note   CSN: [id number] Arrival date & time: 11/25/15  0502     History   Chief Complaint Chief Complaint  Patient presents with  . Altered Mental Status    HPI Tammy Beard is a 75 y.o. female.  Patient is a 75 year old female with past medical history of CVA, hypertension, colitis, and polymyalgia. She presents for evaluation of "not acting herself". She is a resident of a local nursing facility whose staff feels as though she is less verbal and interactive. She is recently been treated for urinary tract infections. The patient denies to me she is experiencing any discomfort. She tells me that she feels well.   The history is provided by the patient.  Altered Mental Status   This is a new problem. The current episode started 2 days ago. The problem has been gradually worsening. Associated symptoms include confusion and weakness.    Past Medical History:  Diagnosis Date  . Colitis   . Ecchymosis 11/15/2015  . GERD (gastroesophageal reflux disease)   . Heart murmur   . Hyperlipidemia   . Hypertension   . Osteopenia    s/p 7 yrs Fosamax  . Polio   . Polymyalgia ([place])   . Rheumatic fever   . Sleep disorder   . Stroke ([place])   . Ulcerative colitis     Patient Active Problem List   Diagnosis Date Noted  . GERD (gastroesophageal reflux disease) 11/22/2015  . Malaise 11/22/2015  . Hyponatremia 11/22/2015  . Ecchymosis 11/15/2015  . Palpitations 11/02/2015  . Atrial tachycardia ([place]) 09/25/2015  . UTI (urinary tract infection) 09/22/2015  . Cerebrovascular accident (CVA) ([place])   . Disequilibrium   . Dizzy 09/18/2015  . 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 Diastasis recti 01/23/2015  . Pre-diabetes 03/07/2014  . Heart murmur 03/07/2014  . Polymyalgia rheumatica ([place]) 12/08/2013  . Disturbance in sleep behavior 06/28/2009  . Urinary frequency 06/28/2009  . Dyslipidemia 11/09/2008  . Essential hypertension 06/18/2007  . ALLERGIC  RHINITIS 06/18/2007  . Ulcerative colitis ([place]) 06/08/2007  . OSTEOPENIA 06/08/2007  . MURMUR 06/08/2007    Past Surgical History:  Procedure Laterality Date  . BREAST LUMPECTOMY  1986   left; benign  . CATARACT EXTRACTION     Left eye   . COLONOSCOPY  2006  . Colonosopy  07/05/13   Dr. Deatra Ina  . DILATION AND CURETTAGE OF UTERUS  12/1999  . MELANOMA EXCISION  2007   left arm  . NECK SURGERY  1986   Benign growth on neck removed  . TONSILLECTOMY      OB History    No data available       Home Medications    Prior to Admission medications   Medication Sig Start Date End Date Taking? Authorizing Provider  aspirin 325 MG tablet Take 1 tablet (325 mg total) by mouth daily. Cont ASA and plavix for 3 months, then plavix daily 09/26/15   Ripudeep Krystal Eaton, MD  atorvastatin (LIPITOR) 10 MG tablet Take 1 tablet (10 mg total) by mouth daily at 6 PM. 09/26/15   Ripudeep K Rai, MD  benazepril-hydrochlorthiazide (LOTENSIN HCT) 20-12.5 MG tablet Take 0.5 tablets by mouth daily.    Historical Provider, MD  Calcium Carbonate-Vit D-Min (CALCIUM 600+D PLUS MINERALS) 600-400 MG-UNIT TABS Take 2 tablets by mouth 3 (three) times daily.    Historical Provider, MD  clopidogrel (PLAVIX) 75 MG tablet Take 1 tablet (75 mg total) by mouth daily. 09/26/15  Ripudeep Krystal Eaton, MD  dimenhyDRINATE (DRAMAMINE) 50 MG tablet Take 1 tablet every 6 hours as needed for nausea or dizziness 09/18/15   [name], MD  fluticasone [facility]) 50 MCG/ACT nasal spray Place 1 spray into both nostrils daily as needed for allergies or rhinitis. Reported on 09/28/2015    Historical Provider, MD  hydroxypropyl methylcellulose / hypromellose (ISOPTO TEARS / GONIOVISC) 2.5 % ophthalmic solution Place 1 drop into both eyes as needed for dry eyes.    Historical Provider, MD  mesalamine (LIALDA) 1.2 g EC tablet take 2 tablets by mouth daily with BREAKFAST 04/26/15   [name], MD  metoprolol tartrate (LOPRESSOR) 25 MG tablet Take 1  tablet (25 mg total) by mouth 2 (two) times daily. 09/26/15   Ripudeep Krystal Eaton, MD  Multiple Vitamin (MULTIVITAMIN) tablet Take 1 tablet by mouth daily.      Historical Provider, MD  predniSONE (DELTASONE) 5 MG tablet TWO TABLETs DAILY TO TREAT POLYMYALGIA RHEUMATICA 08/28/15   [name], MD  ranitidine (ZANTAC) 150 MG tablet Take 150 mg by mouth. Take one tablet at bedtime    Historical Provider, MD  sennosides-docusate sodium (SENOKOT-S) 8.6-50 MG tablet Take 1 tablet by mouth. Take one tablet three times daily for constipation-hold for loose stools    Historical Provider, MD    Family History Family History  Problem Relation Age of Onset  . Pancreatic cancer Other   . Other Mother     Coad/ Bronchiectosis  . Diabetes Paternal Grandmother   . Heart failure Paternal Grandmother   . Cancer Paternal Aunt     unaware of primary site  . Coronary artery disease Neg Hx   . Colon cancer Neg Hx   . Rectal cancer Neg Hx   . Stomach cancer Neg Hx   . Heart attack Neg Hx     Social History Social History  Substance Use Topics  . Smoking status: Never Smoker  . Smokeless tobacco: Never Used  . Alcohol use Yes     Comment: occasional     Allergies   Review of patient's allergies indicates no known allergies.   Review of Systems Review of Systems  Neurological: Positive for weakness.  Psychiatric/Behavioral: Positive for confusion.  All other systems reviewed and are negative.    Physical Exam Updated Vital Signs BP (!) 155/108 (BP Location: Right Arm)   Pulse 105   Temp 98.8 F (37.1 C)   Resp 20   SpO2 98%   Physical Exam  Constitutional: She is oriented to person, place, and time. She appears well-developed and well-nourished. No distress.  HENT:  Head: Normocephalic and atraumatic.  Neck: Normal range of motion. Neck supple.  Cardiovascular: Normal rate and regular rhythm.  Exam reveals no gallop and no friction rub.   No murmur heard. Pulmonary/Chest: Effort  normal and breath sounds normal. No respiratory distress. She has no wheezes.  Abdominal: Soft. Bowel sounds are normal. She exhibits no distension. There is no tenderness.  Musculoskeletal: Normal range of motion.  Neurological: She is alert and oriented to person, place, and time.  Skin: Skin is warm and dry. She is not diaphoretic.  Nursing note and vitals reviewed.    ED Treatments / Results  Labs (all labs ordered are listed, but only abnormal results are displayed) Labs Reviewed  URINE CULTURE  COMPREHENSIVE METABOLIC PANEL  CBC  URINALYSIS, ROUTINE W REFLEX MICROSCOPIC (NOT AT [facility])    EKG  EKG Interpretation  Date/Time:  Sunday  November 25 2015 05:08:42 EDT Ventricular Rate:  107 PR Interval:    QRS Duration: 90 QT Interval:  388 QTC Calculation: 425 R Axis:   75 Text Interpretation:  Sinus tachycardia Ventricular bigeminy Short PR interval Right atrial enlargement Consider left ventricular hypertrophy ST depr, consider ischemia, inferior leads Confirmed by Cian Costanzo  MD, Johnchristopher Sarvis ([id number]) on 11/25/2015 5:32:10 AM       Radiology No results found.  Procedures Procedures (including critical care time)  Medications Ordered in ED Medications  sodium chloride 0.9 % bolus 500 mL (not administered)     Initial Impression / Assessment and Plan / ED Course  I have reviewed the triage vital signs and the nursing notes.  Pertinent labs & imaging results that were available during my care of the patient were reviewed by me and considered in my medical decision making (see chart for details).  Clinical Course    Workup reveals no obvious abnormality with the exception of evidence for a urinary tract infection. She is afebrile, has no white count, there is no hypotension, and she does not appear toxic. She will be given IV Levaquin and discharged home on the same. Urine culture pending at this time. I feel as though she is appropriate for return to her nursing  facility.  Final Clinical Impressions(s) / ED Diagnoses   Final diagnoses:  None    New Prescriptions New Prescriptions   No medications on file     Veryl Speak, MD 11/25/15 0630

## 2015-11-25 NOTE — ED Provider Notes (Signed)
845 a.m. patient resting comfortably. She is alert and awake and in no distress States she's comfortable. Denies pain anywhere. Denies shortness of breath. Patient appears comfortable. Heart rate counted at 84 beats  by me Results for orders placed or performed during the hospital encounter of 11/25/15  Comprehensive metabolic panel  Result Value Ref Range   Sodium 126 (L) 135 - 145 mmol/L   Potassium 3.5 3.5 - 5.1 mmol/L   Chloride 87 (L) 101 - 111 mmol/L   CO2 27 22 - 32 mmol/L   Glucose, Bld 139 (H) 65 - 99 mg/dL   BUN 17 6 - 20 mg/dL   Creatinine, Ser 1.03 (H) 0.44 - 1.00 mg/dL   Calcium 9.1 8.9 - 10.3 mg/dL   Total Protein 7.5 6.5 - 8.1 g/dL   Albumin 4.1 3.5 - 5.0 g/dL   AST 31 15 - 41 U/L   ALT 20 14 - 54 U/L   Alkaline Phosphatase 75 38 - 126 U/L   Total Bilirubin 1.0 0.3 - 1.2 mg/dL   GFR calc non Af Amer 51 (L) >60 mL/min   GFR calc Af Amer 60 (L) >60 mL/min   Anion gap 12 5 - 15  CBC  Result Value Ref Range   WBC 9.9 4.0 - 10.5 K/uL   RBC 4.36 3.87 - 5.11 MIL/uL   Hemoglobin 13.0 12.0 - 15.0 g/dL   HCT 39.3 36.0 - 46.0 %   MCV 90.1 78.0 - 100.0 fL   MCH 29.8 26.0 - 34.0 pg   MCHC 33.1 30.0 - 36.0 g/dL   RDW 12.6 11.5 - 15.5 %   Platelets 185 150 - 400 K/uL  Urinalysis, Routine w reflex microscopic  Result Value Ref Range   Color, Urine YELLOW YELLOW   APPearance CLOUDY (A) CLEAR   Specific Gravity, Urine 1.015 1.005 - 1.030   pH 7.5 5.0 - 8.0   Glucose, UA NEGATIVE NEGATIVE mg/dL   Hgb urine dipstick MODERATE (A) NEGATIVE   Bilirubin Urine NEGATIVE NEGATIVE   Ketones, ur 15 (A) NEGATIVE mg/dL   Protein, ur 100 (A) NEGATIVE mg/dL   Nitrite POSITIVE (A) NEGATIVE   Leukocytes, UA LARGE (A) NEGATIVE  Urine microscopic-add on  Result Value Ref Range   Squamous Epithelial / LPF 0-5 (A) NONE SEEN   WBC, UA 6-30 0 - 5 WBC/hpf   RBC / HPF 0-5 0 - 5 RBC/hpf   Bacteria, UA MANY (A) NONE SEEN   Dg Chest 2 View  Result Date: 11/25/2015 CLINICAL DATA:  Acute onset  of generalized weakness. Initial encounter. EXAM: CHEST  2 VIEW COMPARISON:  Chest radiograph performed 09/21/2015 FINDINGS: The lungs are hyperexpanded, with flattening of the hemidiaphragms, compatible with COPD. Mild left basilar atelectasis is noted. There is no evidence of pleural effusion or pneumothorax. The heart is borderline enlarged. No acute osseous abnormalities are seen. Postoperative change is noted overlying the mediastinum. IMPRESSION: Mild left basilar atelectasis noted. Borderline cardiomegaly. Findings of COPD. Electronically Signed   By: Garald Balding M.D.   On: 11/25/2015 06:12   Ct Head Wo Contrast  Result Date: 11/25/2015 CLINICAL DATA:  Acute onset of generalized weakness and dizziness. Initial encounter. EXAM: CT HEAD WITHOUT CONTRAST TECHNIQUE: Contiguous axial images were obtained from the base of the skull through the vertex without intravenous contrast. COMPARISON:  CTA of the head performed 09/24/2015, and MRA of the brain performed 09/22/2015 FINDINGS: Brain: No evidence of acute infarction, hemorrhage, hydrocephalus, extra-axial collection or mass lesion/mass effect. Prominence  of ventricles and sulci reflects mild to moderate cortical volume loss. Cerebellar atrophy is noted. Scattered periventricular white matter change likely reflects small vessel ischemic microangiopathy. The brainstem and fourth ventricle are within normal limits. The basal ganglia are unremarkable in appearance. The cerebral hemispheres demonstrate grossly normal gray-white differentiation. No mass effect or midline shift is seen. Vascular: No hyperdense vessel or unexpected calcification. Skull: There is no evidence of fracture; visualized osseous structures are unremarkable in appearance. Sinuses/Orbits: The orbits are within normal limits. The paranasal sinuses are well-aerated. There is partial opacification of the mastoid air cells bilaterally. Other: No significant soft tissue abnormalities are seen.  IMPRESSION: 1. No acute intracranial pathology seen on CT. 2. Mild to moderate cortical volume loss and scattered small vessel ischemic microangiopathy. 3. Partial opacification of the mastoid air cells bilaterally. Electronically Signed   By: Garald Balding M.D.   On: 11/25/2015 06:27     Orlie Dakin, MD 11/25/15 [id number]

## 2015-11-25 NOTE — Progress Notes (Signed)
Unable to place pt's hearing aids. Placed in denture cup with name and room number on lid. Container returned to [employer], Karl Ito by transporter.

## 2015-11-25 NOTE — Discharge Instructions (Signed)
Levaquin as prescribed.  Return to the emergency department if your symptoms substantially worsen or change.

## 2015-11-26 ENCOUNTER — Encounter (HOSPITAL_COMMUNITY): Payer: Self-pay

## 2015-11-26 ENCOUNTER — Emergency Department (HOSPITAL_COMMUNITY): Payer: Medicare Other

## 2015-11-26 ENCOUNTER — Inpatient Hospital Stay (HOSPITAL_COMMUNITY)
Admission: EM | Admit: 2015-11-26 | Discharge: 2015-12-03 | DRG: 871 | Disposition: A | Discharge status: Skilled Nursing Facility | Payer: Medicare Other | Attending: Family Medicine | Admitting: Family Medicine

## 2015-11-26 DIAGNOSIS — R29810 Facial weakness: Secondary | ICD-10-CM | POA: Diagnosis not present

## 2015-11-26 DIAGNOSIS — I10 Essential (primary) hypertension: Secondary | ICD-10-CM | POA: Diagnosis not present

## 2015-11-26 DIAGNOSIS — K219 Gastro-esophageal reflux disease without esophagitis: Secondary | ICD-10-CM | POA: Diagnosis not present

## 2015-11-26 DIAGNOSIS — I471 Supraventricular tachycardia: Secondary | ICD-10-CM | POA: Diagnosis present

## 2015-11-26 DIAGNOSIS — E871 Hypo-osmolality and hyponatremia: Secondary | ICD-10-CM | POA: Diagnosis not present

## 2015-11-26 DIAGNOSIS — I110 Hypertensive heart disease with heart failure: Secondary | ICD-10-CM | POA: Diagnosis present

## 2015-11-26 DIAGNOSIS — G9341 Metabolic encephalopathy: Secondary | ICD-10-CM | POA: Diagnosis present

## 2015-11-26 DIAGNOSIS — R41841 Cognitive communication deficit: Secondary | ICD-10-CM | POA: Diagnosis not present

## 2015-11-26 DIAGNOSIS — Z8673 Personal history of transient ischemic attack (TIA), and cerebral infarction without residual deficits: Secondary | ICD-10-CM

## 2015-11-26 DIAGNOSIS — I6789 Other cerebrovascular disease: Secondary | ICD-10-CM | POA: Diagnosis not present

## 2015-11-26 DIAGNOSIS — M353 Polymyalgia rheumatica: Secondary | ICD-10-CM | POA: Diagnosis present

## 2015-11-26 DIAGNOSIS — R42 Dizziness and giddiness: Secondary | ICD-10-CM | POA: Diagnosis not present

## 2015-11-26 DIAGNOSIS — B9689 Other specified bacterial agents as the cause of diseases classified elsewhere: Secondary | ICD-10-CM | POA: Diagnosis present

## 2015-11-26 DIAGNOSIS — A4150 Gram-negative sepsis, unspecified: Principal | ICD-10-CM | POA: Diagnosis present

## 2015-11-26 DIAGNOSIS — Z85820 Personal history of malignant melanoma of skin: Secondary | ICD-10-CM | POA: Diagnosis not present

## 2015-11-26 DIAGNOSIS — A498 Other bacterial infections of unspecified site: Secondary | ICD-10-CM | POA: Diagnosis present

## 2015-11-26 DIAGNOSIS — R7989 Other specified abnormal findings of blood chemistry: Secondary | ICD-10-CM | POA: Diagnosis not present

## 2015-11-26 DIAGNOSIS — M6281 Muscle weakness (generalized): Secondary | ICD-10-CM | POA: Diagnosis not present

## 2015-11-26 DIAGNOSIS — Z7902 Long term (current) use of antithrombotics/antiplatelets: Secondary | ICD-10-CM

## 2015-11-26 DIAGNOSIS — M25511 Pain in right shoulder: Secondary | ICD-10-CM | POA: Diagnosis not present

## 2015-11-26 DIAGNOSIS — I00 Rheumatic fever without heart involvement: Secondary | ICD-10-CM | POA: Diagnosis present

## 2015-11-26 DIAGNOSIS — Z87440 Personal history of urinary (tract) infections: Secondary | ICD-10-CM

## 2015-11-26 DIAGNOSIS — E876 Hypokalemia: Secondary | ICD-10-CM | POA: Diagnosis not present

## 2015-11-26 DIAGNOSIS — E785 Hyperlipidemia, unspecified: Secondary | ICD-10-CM | POA: Diagnosis not present

## 2015-11-26 DIAGNOSIS — J439 Emphysema, unspecified: Secondary | ICD-10-CM | POA: Diagnosis not present

## 2015-11-26 DIAGNOSIS — N10 Acute tubulo-interstitial nephritis: Secondary | ICD-10-CM

## 2015-11-26 DIAGNOSIS — Z8612 Personal history of poliomyelitis: Secondary | ICD-10-CM

## 2015-11-26 DIAGNOSIS — K5190 Ulcerative colitis, unspecified, without complications: Secondary | ICD-10-CM | POA: Diagnosis present

## 2015-11-26 DIAGNOSIS — Z7952 Long term (current) use of systemic steroids: Secondary | ICD-10-CM

## 2015-11-26 DIAGNOSIS — R278 Other lack of coordination: Secondary | ICD-10-CM | POA: Diagnosis not present

## 2015-11-26 DIAGNOSIS — R2681 Unsteadiness on feet: Secondary | ICD-10-CM | POA: Diagnosis not present

## 2015-11-26 DIAGNOSIS — N390 Urinary tract infection, site not specified: Secondary | ICD-10-CM

## 2015-11-26 DIAGNOSIS — I248 Other forms of acute ischemic heart disease: Secondary | ICD-10-CM | POA: Diagnosis present

## 2015-11-26 DIAGNOSIS — K5130 Ulcerative (chronic) rectosigmoiditis without complications: Secondary | ICD-10-CM | POA: Diagnosis not present

## 2015-11-26 DIAGNOSIS — N12 Tubulo-interstitial nephritis, not specified as acute or chronic: Secondary | ICD-10-CM | POA: Diagnosis present

## 2015-11-26 DIAGNOSIS — A419 Sepsis, unspecified organism: Secondary | ICD-10-CM | POA: Diagnosis present

## 2015-11-26 DIAGNOSIS — I5042 Chronic combined systolic (congestive) and diastolic (congestive) heart failure: Secondary | ICD-10-CM | POA: Diagnosis not present

## 2015-11-26 DIAGNOSIS — R1312 Dysphagia, oropharyngeal phase: Secondary | ICD-10-CM | POA: Diagnosis not present

## 2015-11-26 DIAGNOSIS — N179 Acute kidney failure, unspecified: Secondary | ICD-10-CM | POA: Diagnosis present

## 2015-11-26 DIAGNOSIS — R011 Cardiac murmur, unspecified: Secondary | ICD-10-CM | POA: Diagnosis present

## 2015-11-26 DIAGNOSIS — Z8249 Family history of ischemic heart disease and other diseases of the circulatory system: Secondary | ICD-10-CM

## 2015-11-26 DIAGNOSIS — Z79899 Other long term (current) drug therapy: Secondary | ICD-10-CM

## 2015-11-26 DIAGNOSIS — Z7982 Long term (current) use of aspirin: Secondary | ICD-10-CM | POA: Diagnosis not present

## 2015-11-26 DIAGNOSIS — I509 Heart failure, unspecified: Secondary | ICD-10-CM | POA: Diagnosis not present

## 2015-11-26 DIAGNOSIS — Z66 Do not resuscitate: Secondary | ICD-10-CM | POA: Diagnosis present

## 2015-11-26 DIAGNOSIS — I639 Cerebral infarction, unspecified: Secondary | ICD-10-CM | POA: Diagnosis not present

## 2015-11-26 DIAGNOSIS — R4182 Altered mental status, unspecified: Secondary | ICD-10-CM | POA: Diagnosis not present

## 2015-11-26 LAB — COMPREHENSIVE METABOLIC PANEL
ALBUMIN: 4 g/dL (ref 3.5–5.0)
ALK PHOS: 64 U/L (ref 38–126)
ALT: 20 U/L (ref 14–54)
ANION GAP: 12 (ref 5–15)
AST: 21 U/L (ref 15–41)
BILIRUBIN TOTAL: 0.8 mg/dL (ref 0.3–1.2)
BUN: 24 mg/dL — AB (ref 6–20)
CALCIUM: 10.7 mg/dL — AB (ref 8.9–10.3)
CO2: 26 mmol/L (ref 22–32)
CREATININE: 1.26 mg/dL — AB (ref 0.44–1.00)
Chloride: 85 mmol/L — ABNORMAL LOW (ref 101–111)
GFR calc Af Amer: 47 mL/min — ABNORMAL LOW (ref >60)
GFR calc non Af Amer: 40 mL/min — ABNORMAL LOW (ref >60)
GLUCOSE: 173 mg/dL — AB (ref 65–99)
Potassium: 3.5 mmol/L (ref 3.5–5.1)
SODIUM: 123 mmol/L — AB (ref 135–145)
TOTAL PROTEIN: 7.3 g/dL (ref 6.5–8.1)

## 2015-11-26 LAB — BASIC METABOLIC PANEL
Anion gap: 5 (ref 5–15)
BUN: 21 mg/dL — ABNORMAL HIGH (ref 6–20)
CALCIUM: 8.6 mg/dL — AB (ref 8.9–10.3)
CO2: 27 mmol/L (ref 22–32)
CREATININE: 1.08 mg/dL — AB (ref 0.44–1.00)
Chloride: 95 mmol/L — ABNORMAL LOW (ref 101–111)
GFR, EST AFRICAN AMERICAN: 56 mL/min — AB (ref >60)
GFR, EST NON AFRICAN AMERICAN: 49 mL/min — AB (ref >60)
Glucose, Bld: 133 mg/dL — ABNORMAL HIGH (ref 65–99)
Potassium: 2.8 mmol/L — ABNORMAL LOW (ref 3.5–5.1)
SODIUM: 127 mmol/L — AB (ref 135–145)

## 2015-11-26 LAB — URINALYSIS, ROUTINE W REFLEX MICROSCOPIC
Bilirubin Urine: NEGATIVE
GLUCOSE, UA: NEGATIVE mg/dL
Ketones, ur: 15 mg/dL — AB
LEUKOCYTES UA: NEGATIVE
Nitrite: NEGATIVE
PROTEIN: 100 mg/dL — AB
SPECIFIC GRAVITY, URINE: 1.024 (ref 1.005–1.030)
pH: 6 (ref 5.0–8.0)

## 2015-11-26 LAB — TROPONIN I
TROPONIN I: 0.06 ng/mL — AB (ref <0.03)
TROPONIN I: 0.07 ng/mL — AB (ref <0.03)
TROPONIN I: 0.07 ng/mL — AB (ref <0.03)

## 2015-11-26 LAB — LACTIC ACID, PLASMA
Lactic Acid, Venous: 0.9 mmol/L (ref 0.5–1.9)
Lactic Acid, Venous: 0.9 mmol/L (ref 0.5–1.9)

## 2015-11-26 LAB — CBC WITH DIFFERENTIAL/PLATELET
BASOS ABS: 0 10*3/uL (ref 0.0–0.1)
BASOS PCT: 0 %
EOS ABS: 0 10*3/uL (ref 0.0–0.7)
EOS PCT: 0 %
HEMATOCRIT: 39.1 % (ref 36.0–46.0)
Hemoglobin: 13.4 g/dL (ref 12.0–15.0)
Lymphocytes Relative: 6 %
Lymphs Abs: 0.8 10*3/uL (ref 0.7–4.0)
MCH: 30.2 pg (ref 26.0–34.0)
MCHC: 34.3 g/dL (ref 30.0–36.0)
MCV: 88.1 fL (ref 78.0–100.0)
MONO ABS: 1.2 10*3/uL — AB (ref 0.1–1.0)
MONOS PCT: 9 %
NEUTROS ABS: 11.3 10*3/uL — AB (ref 1.7–7.7)
Neutrophils Relative %: 85 %
PLATELETS: 209 10*3/uL (ref 150–400)
RBC: 4.44 MIL/uL (ref 3.87–5.11)
RDW: 12.5 % (ref 11.5–15.5)
WBC: 13.3 10*3/uL — ABNORMAL HIGH (ref 4.0–10.5)

## 2015-11-26 LAB — APTT: APTT: 31 s (ref 24–36)

## 2015-11-26 LAB — I-STAT CG4 LACTIC ACID, ED: Lactic Acid, Venous: 1.46 mmol/L (ref 0.5–1.9)

## 2015-11-26 LAB — URINE MICROSCOPIC-ADD ON: BACTERIA UA: NONE SEEN

## 2015-11-26 LAB — MRSA PCR SCREENING: MRSA by PCR: NEGATIVE

## 2015-11-26 LAB — PROTIME-INR
INR: 1.08
Prothrombin Time: 14 seconds (ref 11.4–15.2)

## 2015-11-26 LAB — PROCALCITONIN: Procalcitonin: <0.1 ng/mL

## 2015-11-26 MED ORDER — CLOPIDOGREL BISULFATE 75 MG PO TABS
75.0000 mg | ORAL_TABLET | Freq: Every day | ORAL | Status: DC
  Administered 2015-11-26 – 2015-12-03 (×8): 75 mg via ORAL
  Filled 2015-11-26 (×8): qty 1

## 2015-11-26 MED ORDER — HEPARIN SODIUM (PORCINE) 5000 UNIT/ML IJ SOLN
5000.0000 [IU] | Freq: Three times a day (TID) | INTRAMUSCULAR | Status: DC
  Administered 2015-11-26 – 2015-12-03 (×21): 5000 [IU] via SUBCUTANEOUS
  Filled 2015-11-26 (×21): qty 1

## 2015-11-26 MED ORDER — SODIUM CHLORIDE 0.9 % IV BOLUS (SEPSIS)
500.0000 mL | Freq: Once | INTRAVENOUS | Status: AC
  Administered 2015-11-26: 500 mL via INTRAVENOUS

## 2015-11-26 MED ORDER — SODIUM CHLORIDE 0.9 % IV BOLUS (SEPSIS)
1000.0000 mL | Freq: Once | INTRAVENOUS | Status: AC
  Administered 2015-11-26: 1000 mL via INTRAVENOUS

## 2015-11-26 MED ORDER — MESALAMINE 1.2 G PO TBEC
1.2000 g | DELAYED_RELEASE_TABLET | Freq: Every day | ORAL | Status: DC
  Administered 2015-11-27 – 2015-11-29 (×3): 1.2 g via ORAL
  Filled 2015-11-26 (×5): qty 1

## 2015-11-26 MED ORDER — DEXTROSE 5 % IV SOLN
1.0000 g | INTRAVENOUS | Status: DC
  Administered 2015-11-27 – 2015-11-29 (×3): 1 g via INTRAVENOUS
  Filled 2015-11-26 (×3): qty 1

## 2015-11-26 MED ORDER — DEXTROSE 5 % IV SOLN
2.0000 g | Freq: Once | INTRAVENOUS | Status: AC
  Administered 2015-11-26: 2 g via INTRAVENOUS
  Filled 2015-11-26: qty 2

## 2015-11-26 MED ORDER — HYDRALAZINE HCL 20 MG/ML IJ SOLN
5.0000 mg | INTRAMUSCULAR | Status: DC | PRN
  Administered 2015-11-28: 5 mg via INTRAVENOUS
  Filled 2015-11-26: qty 1

## 2015-11-26 MED ORDER — OXYCODONE HCL 5 MG PO TABS
5.0000 mg | ORAL_TABLET | ORAL | Status: DC | PRN
  Administered 2015-12-02: 5 mg via ORAL
  Filled 2015-11-26: qty 1

## 2015-11-26 MED ORDER — ATORVASTATIN CALCIUM 10 MG PO TABS
10.0000 mg | ORAL_TABLET | Freq: Every day | ORAL | Status: DC
  Administered 2015-11-26 – 2015-12-02 (×7): 10 mg via ORAL
  Filled 2015-11-26 (×7): qty 1

## 2015-11-26 MED ORDER — METOPROLOL TARTRATE 5 MG/5ML IV SOLN: 5.0000 mg | INTRAVENOUS | Status: DC | PRN

## 2015-11-26 MED ORDER — SODIUM CHLORIDE 0.9 % IV SOLN
INTRAVENOUS | Status: DC
  Administered 2015-11-26 – 2015-12-01 (×7): via INTRAVENOUS
  Administered 2015-12-01: 100 mL/h via INTRAVENOUS
  Administered 2015-12-01 – 2015-12-02 (×3): via INTRAVENOUS
  Administered 2015-12-03: 1000 mL via INTRAVENOUS

## 2015-11-26 MED ORDER — SODIUM CHLORIDE 0.9 % IV BOLUS (SEPSIS)
250.0000 mL | Freq: Once | INTRAVENOUS | Status: AC
  Administered 2015-11-26: 250 mL via INTRAVENOUS

## 2015-11-26 MED ORDER — SODIUM CHLORIDE 0.9 % IV SOLN
1000.0000 mL | INTRAVENOUS | Status: DC
  Administered 2015-11-26: 1000 mL via INTRAVENOUS

## 2015-11-26 MED ORDER — ACETAMINOPHEN 325 MG PO TABS: 650.0000 mg | ORAL_TABLET | Freq: Four times a day (QID) | ORAL | Status: DC | PRN

## 2015-11-26 MED ORDER — FAMOTIDINE 20 MG PO TABS
20.0000 mg | ORAL_TABLET | Freq: Every day | ORAL | Status: DC
  Administered 2015-11-26 – 2015-12-03 (×8): 20 mg via ORAL
  Filled 2015-11-26 (×8): qty 1

## 2015-11-26 MED ORDER — ACETAMINOPHEN 650 MG RE SUPP: 650.0000 mg | Freq: Four times a day (QID) | RECTAL | Status: DC | PRN

## 2015-11-26 MED ORDER — DIMENHYDRINATE 50 MG PO TABS
50.0000 mg | ORAL_TABLET | Freq: Four times a day (QID) | ORAL | Status: DC | PRN
  Filled 2015-11-26: qty 1

## 2015-11-26 MED ORDER — SODIUM CHLORIDE 0.9% FLUSH
3.0000 mL | Freq: Two times a day (BID) | INTRAVENOUS | Status: DC
  Administered 2015-11-26 – 2015-12-03 (×10): 3 mL via INTRAVENOUS

## 2015-11-26 MED ORDER — PREDNISONE 5 MG PO TABS
5.0000 mg | ORAL_TABLET | Freq: Every day | ORAL | Status: DC
  Administered 2015-11-27 – 2015-11-29 (×3): 5 mg via ORAL
  Filled 2015-11-26 (×3): qty 1

## 2015-11-26 NOTE — H&P (Signed)
History and Physical    Tammy Beard OAC:[id number] DOB: 05/27/1940 DOA: 11/26/2015  PCP: Jeanmarie Hubert, MD Patient coming from: Assisted living  Chief Complaint: ams  HPI: Tammy Beard is a 75 y.o. female with medical history significant of easy bruising, GERD, HIV, HTN, osteopenia, polio, polymyalgia rheumatic, rheumatic fever, CVA, ulcerative colitis presenting in all altered mental state. Level V caveat applies as patient is unable to provide any reliable history at this time. History provided by assisted living facility report, EDP and patient's sister.  Patient was in her normal state of health until approximately 1 day ago when she developed confusion. His condition worsened over the last 24 hours. Patient with little to no oral intake. No focal complaint. No reported fevers or vital sign instability from the nursing home. Patient seen one day ago for treatment of the UTI and was on Levaquin at that time. EMS reported possible left facial droop though this was not noted by family member or by nursing home staff.    ED Course: objective findings outlined below. Pt met sepsis criteria  Review of Systems: As per HPI otherwise 10 point review of systems negative.   Ambulatory Status: slow but no restrictions  Past Medical History:  Diagnosis Date  . Colitis   . Ecchymosis 11/15/2015  . GERD (gastroesophageal reflux disease)   . Heart murmur   . Hyperlipidemia   . Hypertension   . Osteopenia    s/p 7 yrs Fosamax  . Polio   . Polymyalgia ([place])   . Rheumatic fever   . Sleep disorder   . Stroke ([place])   . Ulcerative colitis     Past Surgical History:  Procedure Laterality Date  . BREAST LUMPECTOMY  1986   left; benign  . CATARACT EXTRACTION     Left eye   . COLONOSCOPY  2006  . Colonosopy  07/05/13   Dr. Deatra Ina  . DILATION AND CURETTAGE OF UTERUS  12/1999  . MELANOMA EXCISION  2007   left arm  . NECK SURGERY  1986   Benign growth on neck removed  . TONSILLECTOMY       Social History   Social History  . Marital status: Single    Spouse name: N/A  . Number of children: N/A  . Years of education: N/A   Occupational History  . retired    Social History Main Topics  . Smoking status: Never Smoker  . Smokeless tobacco: Never Used  . Alcohol use Yes     Comment: occasional  . Drug use: No  . Sexual activity: No   Other Topics Concern  . Not on file   Social History Narrative   Daily Caffeine use- soda (diet)      Do you drink/ eat things with caffeine? Yes, sometimes      Marital status:    Never                            Lives at [facility], transfer to skill unit 09/26/15, transfer to AL 11/09/15      Do you have any pets in your home ?(please list) No      Current or past profession: Geophysicist/field seismologist for [employer]      Do you exercise?    No  Living Will, POA, DNR, MOST    No Known Allergies  Family History  Problem Relation Age of Onset  . Pancreatic cancer Other   . Other Mother     Coad/ Bronchiectosis  . Diabetes Paternal Grandmother   . Heart failure Paternal Grandmother   . Cancer Paternal Aunt     unaware of primary site  . Coronary artery disease Neg Hx   . Colon cancer Neg Hx   . Rectal cancer Neg Hx   . Stomach cancer Neg Hx   . Heart attack Neg Hx     Prior to Admission medications   Medication Sig Start Date End Date Taking? Authorizing Provider  acetaminophen (TYLENOL) 325 MG tablet Take 650 mg by mouth every 6 (six) hours as needed.   Yes Historical Provider, MD  ARTIFICIAL TEAR SOLUTION OP Apply 1 drop to eye daily as needed.   Yes Historical Provider, MD  atorvastatin (LIPITOR) 10 MG tablet Take 1 tablet (10 mg total) by mouth daily at 6 PM. 09/26/15  Yes Ripudeep K Rai, MD  benazepril-hydrochlorthiazide (LOTENSIN HCT) 20-12.5 MG tablet Take 0.5 tablets by mouth daily.   Yes Historical Provider, MD  Calcium Carbonate-Vit D-Min (CALCIUM 600+D PLUS MINERALS) 600-400 MG-UNIT  TABS Take 2 tablets by mouth 2 (two) times daily.    Yes Historical Provider, MD  clopidogrel (PLAVIX) 75 MG tablet Take 1 tablet (75 mg total) by mouth daily. 09/26/15  Yes Ripudeep Krystal Eaton, MD  dimenhyDRINATE (DRAMAMINE) 50 MG tablet Take 1 tablet every 6 hours as needed for nausea or dizziness 09/18/15  Yes [name], MD  levofloxacin (LEVAQUIN) 750 MG tablet Take 1 tablet (750 mg total) by mouth daily. X 7 days 11/25/15  Yes Veryl Speak, MD  mesalamine (LIALDA) 1.2 g EC tablet take 2 tablets by mouth daily with BREAKFAST 04/26/15  Yes [name], MD  metoprolol tartrate (LOPRESSOR) 25 MG tablet Take 1 tablet (25 mg total) by mouth 2 (two) times daily. 09/26/15  Yes Ripudeep Krystal Eaton, MD  Multiple Vitamin (MULTIVITAMIN) tablet Take 1 tablet by mouth daily.     Yes Historical Provider, MD  predniSONE (DELTASONE) 5 MG tablet TWO TABLETs DAILY TO TREAT POLYMYALGIA RHEUMATICA 08/28/15  Yes [name], MD  ranitidine (ZANTAC) 150 MG tablet Take 150 mg by mouth at bedtime. Take one tablet at bedtime    Yes Historical Provider, MD  sennosides-docusate sodium (SENOKOT-S) 8.6-50 MG tablet Take 1 tablet by mouth 3 (three) times daily. Take one tablet three times daily for constipation-hold for loose stools    Yes Historical Provider, MD  fluticasone (FLONASE) 50 MCG/ACT nasal spray Place 1 spray into both nostrils daily as needed for allergies or rhinitis. Reported on 09/28/2015    Historical Provider, MD    Physical Exam: Vitals:   11/26/15 1330 11/26/15 1345 11/26/15 1408 11/26/15 1506  BP: 158/71 160/83  (!) 147/119  Pulse: 111 (!) 48  89  Resp: 15 22  20   Temp:   100.6 F (38.1 C)   TempSrc:   Rectal   SpO2: 99% 95%  99%  Weight:      Height:         General: Ill-appearing, lying in bed Eyes:  PERRL, EOMI, normal lids, iris ENT: Dry mucous membranes, normal tongue Neck:  no LAD, masses or thyromegaly Cardiovascular: 3-6 systolic murmur, regular rate and rhythm, 1+ bilateral lower  extremity pitting edema Respiratory:  CTA bilaterally, no w/r/r. Normal respiratory effort. Abdomen:  soft, ntnd, NABS Skin: Few ecchymoses on upper and lower extremities bilaterally. Musculoskeletal:  grossly normal tone BUE/BLE, good ROM, no bony abnormality Psychiatric: Neurological:  Difficult to fully assess given patient's current acute illness and mental status. Patient wakes up briefly and will attempt to answer questions properly. Patient confused. Alert and oriented 1. Moves all extremities and coronary fashion follows basic commands.   Labs on Admission: I have personally reviewed following labs and imaging studies  CBC:  Recent Labs Lab 11/25/15 0512 11/26/15 1013  WBC 9.9 13.3*  NEUTROABS  --  11.3*  HGB 13.0 13.4  HCT 39.3 39.1  MCV 90.1 88.1  PLT 185 161   Basic Metabolic Panel:  Recent Labs Lab 11/25/15 0512 11/26/15 1013  NA 126* 123*  K 3.5 3.5  CL 87* 85*  CO2 27 26  GLUCOSE 139* 173*  BUN 17 24*  CREATININE 1.03* 1.26*  CALCIUM 9.1 10.7*   GFR: Estimated Creatinine Clearance: 28.7 mL/min (by C-G formula based on SCr of 1.26 mg/dL). Liver Function Tests:  Recent Labs Lab 11/25/15 0512 11/26/15 1013  AST 31 21  ALT 20 20  ALKPHOS 75 64  BILITOT 1.0 0.8  PROT 7.5 7.3  ALBUMIN 4.1 4.0   No results for input(s): LIPASE, AMYLASE in the last 168 hours. No results for input(s): AMMONIA in the last 168 hours. Coagulation Profile: No results for input(s): INR, PROTIME in the last 168 hours. Cardiac Enzymes:  Recent Labs Lab 11/26/15 1013  TROPONINI 0.06*   BNP (last 3 results) No results for input(s): PROBNP in the last 8760 hours. HbA1C: No results for input(s): HGBA1C in the last 72 hours. CBG: No results for input(s): GLUCAP in the last 168 hours. Lipid Profile: No results for input(s): CHOL, HDL, LDLCALC, TRIG, CHOLHDL, LDLDIRECT in the last 72 hours. Thyroid Function Tests: No results for input(s): TSH, T4TOTAL, FREET4, T3FREE,  THYROIDAB in the last 72 hours. Anemia Panel: No results for input(s): VITAMINB12, FOLATE, FERRITIN, TIBC, IRON, RETICCTPCT in the last 72 hours. Urine analysis:    Component Value Date/Time   COLORURINE YELLOW 11/26/2015 1021   APPEARANCEUR CLEAR 11/26/2015 1021   LABSPEC 1.024 11/26/2015 1021   PHURINE 6.0 11/26/2015 1021   GLUCOSEU NEGATIVE 11/26/2015 1021   HGBUR SMALL (A) 11/26/2015 1021   HGBUR negative 09/06/2009 1459   BILIRUBINUR NEGATIVE 11/26/2015 1021   BILIRUBINUR NEG 08/23/2010 1440   KETONESUR 15 (A) 11/26/2015 1021   PROTEINUR 100 (A) 11/26/2015 1021   UROBILINOGEN 0.2 08/23/2010 1440   UROBILINOGEN 0.2 09/06/2009 1459   NITRITE NEGATIVE 11/26/2015 1021   LEUKOCYTESUR NEGATIVE 11/26/2015 1021    Creatinine Clearance: Estimated Creatinine Clearance: 28.7 mL/min (by C-G formula based on SCr of 1.26 mg/dL).  Sepsis Labs: @LABRCNTIP (procalcitonin:4,lacticidven:4) ) Recent Results (from the past 240 hour(s))  Urine culture     Status: Abnormal (Preliminary result)   Collection Time: 11/25/15  5:35 AM  Result Value Ref Range Status   Specimen Description URINE, CATHETERIZED  Final   Special Requests NONE  Final   Culture >=100,000 COLONIES/mL CITROBACTER FREUNDII (A)  Final   Report Status PENDING  Incomplete     Radiological Exams on Admission: Dg Chest 2 View  Result Date: 11/25/2015 CLINICAL DATA:  Acute onset of generalized weakness. Initial encounter. EXAM: CHEST  2 VIEW COMPARISON:  Chest radiograph performed 09/21/2015 FINDINGS: The lungs are hyperexpanded, with flattening of the hemidiaphragms, compatible with COPD. Mild left basilar atelectasis is noted. There is no evidence of pleural effusion  or pneumothorax. The heart is borderline enlarged. No acute osseous abnormalities are seen. Postoperative change is noted overlying the mediastinum. IMPRESSION: Mild left basilar atelectasis noted. Borderline cardiomegaly. Findings of COPD. Electronically Signed    By: Garald Balding M.D.   On: 11/25/2015 06:12   Ct Head Wo Contrast  Result Date: 11/26/2015 CLINICAL DATA:  75 year old female with altered mental status. EXAM: CT HEAD WITHOUT CONTRAST TECHNIQUE: Contiguous axial images were obtained from the base of the skull through the vertex without intravenous contrast. COMPARISON:  11/25/2015 and prior CTs dating back to 09/21/2015 FINDINGS: Brain: No evidence of acute infarction, hemorrhage, hydrocephalus, extra-axial collection or mass lesion/mass effect. Atrophy and chronic small-vessel white matter ischemic changes again identified. Vascular: Intracranial atherosclerotic calcifications noted. Skull: Unremarkable Sinuses/Orbits: Visualized portions unremarkable. Other: None IMPRESSION: No evidence of acute intracranial abnormality. Atrophy and chronic small-vessel white matter ischemic changes. Electronically Signed   By: Margarette Canada M.D.   On: 11/26/2015 11:25   Ct Head Wo Contrast  Result Date: 11/25/2015 CLINICAL DATA:  Acute onset of generalized weakness and dizziness. Initial encounter. EXAM: CT HEAD WITHOUT CONTRAST TECHNIQUE: Contiguous axial images were obtained from the base of the skull through the vertex without intravenous contrast. COMPARISON:  CTA of the head performed 09/24/2015, and MRA of the brain performed 09/22/2015 FINDINGS: Brain: No evidence of acute infarction, hemorrhage, hydrocephalus, extra-axial collection or mass lesion/mass effect. Prominence of ventricles and sulci reflects mild to moderate cortical volume loss. Cerebellar atrophy is noted. Scattered periventricular white matter change likely reflects small vessel ischemic microangiopathy. The brainstem and fourth ventricle are within normal limits. The basal ganglia are unremarkable in appearance. The cerebral hemispheres demonstrate grossly normal gray-white differentiation. No mass effect or midline shift is seen. Vascular: No hyperdense vessel or unexpected calcification. Skull:  There is no evidence of fracture; visualized osseous structures are unremarkable in appearance. Sinuses/Orbits: The orbits are within normal limits. The paranasal sinuses are well-aerated. There is partial opacification of the mastoid air cells bilaterally. Other: No significant soft tissue abnormalities are seen. IMPRESSION: 1. No acute intracranial pathology seen on CT. 2. Mild to moderate cortical volume loss and scattered small vessel ischemic microangiopathy. 3. Partial opacification of the mastoid air cells bilaterally. Electronically Signed   By: Garald Balding M.D.   On: 11/25/2015 06:27   Dg Chest Port 1 View  Result Date: 11/26/2015 CLINICAL DATA:  75 year old female with a history of altered mental status EXAM: PORTABLE CHEST 1 VIEW COMPARISON:  11/25/2015, 09/21/2015 FINDINGS: Cardiomediastinal silhouette unchanged in size and contour. Calcifications of the aortic arch. Since the prior there has been removal of the event recorder on the chest wall. Stigmata of emphysema, with flattened hemidiaphragms, and hyperinflation. Chronic interstitial opacities persist. Pleural parenchymal thickening at the lung apices. No pleural effusion or confluent airspace disease.  No pneumothorax. No displaced fracture. IMPRESSION: Chronic lung changes and emphysema without evidence of superimposed acute cardiopulmonary disease. Aortic atherosclerosis. Signed, Dulcy Fanny. Earleen Newport, DO [facility] Electronically Signed   By: Corrie Mckusick D.O.   On: 11/26/2015 10:37    EKG: Independently reviewed.   Assessment/Plan Active Problems:   Dyslipidemia   Essential hypertension   Ulcerative colitis ([place])   Polymyalgia rheumatica ([facility])   UTI (urinary tract infection)   GERD (gastroesophageal reflux disease)   Hyponatremia   Sepsis ([place])   Urosepsis: Diagnosed with UTI on 11/25/2015. Returned to ED on 11/26/2015 with worsening mental status and vital sign  instability including temperature  100.7, tachycardia, tachypnea, blood pressure stable, worsening renal function with creatinine 1.26, troponin 0.06, lactic acid 1.46, WBC 13.3 with left shift, worsening hyponatremia 123. Urine culture from 11/25/2015 showing greater than 100,000 colonies of Citrobacter freundii.  - Stepdown - Sepsis protocol - DC Levaquin. We'll start cefepime as this has good coverage of known bacteria species. Sensitivities pending. Appreciate pharmacy assistance. - Urology f/u outpt as pt having repeated frequent UTIs. Likely to need long term outpt treatment for UTI.  - f/u BCX and UCX  Hyponatremia: 123 on admission. Downtrending over past 2 months. Suspect from recurrent illnesses and poor oral Na intake.  - NS - BMET q12  AKI: Cr 1.26. Baseline 1. Suspect from poor oral intake and urosepsis - IVF - BMET in am  Troponin: 0.06. Likely demand. - cycle trop - tele - EKG in am  GERD: - continue H2 blocker  HTN: - Resume an Benazepril, HCTZ, Lopressor when stable - Hydralazine and IV lopressor PRN  PMR:  - continue daily prednisone 100m  UC: - continue mesalamine  Previous CVA: no lasting deficits. CT on 9/3 an d9/4 w/o acute abnormality. No focal abnormalities noted on admission.  - Monitor   DVT prophylaxis: Hep  Code Status: DNR  Family Communication: daughter  Disposition Plan: pending improvement  Consults called: none  Admission status: inpt    MERRELL, DAVID J MD [facility]  If 7PM-7AM, please contact night-coverage [email or web address] Password [email or web address]  11/26/2015, 4:30 PM

## 2015-11-26 NOTE — ED Notes (Signed)
Called AC.  Unable to get through to floor.

## 2015-11-26 NOTE — ED Notes (Signed)
Dr. Merrell at bedside. 

## 2015-11-26 NOTE — Progress Notes (Addendum)
Pharmacy Antibiotic Note  Tammy Beard is a 75 y.o. female admitted on 11/26/2015 with UTI.  Pharmacy has been consulted for cefepime dosing. Tmax is 100.7 and WBC 13.3, SCr was 1.03 yesterday and now up to 1.25. Lactic acid is 1.46.   Plan: - Cefepime 2gm IV x 1 then 1gm IV Q24H - F/u renal fxn, C&S, clinical status   Height: 5\' 1"  (154.9 cm) Weight: 118 lb (53.5 kg) IBW/kg (Calculated) : 47.8  Temp (24hrs), Avg:100.7 F (38.2 C), Min:100.7 F (38.2 C), Max:100.7 F (38.2 C)   Recent Labs Lab 11/25/15 0512 11/26/15 1020  WBC 9.9  --   CREATININE 1.03*  --   LATICACIDVEN  --  1.46    Estimated Creatinine Clearance: 35.1 mL/min (by C-G formula based on SCr of 1.03 mg/dL).    No Known Allergies  Antimicrobials this admission: Cefepime 9/4>>  Dose adjustments this admission: N/A  Microbiology results: 9/3 Urine - >100K GNR  Thank you for allowing pharmacy to be a part of this patient's care.  Nicholus Chandran, Rande Lawman 11/26/2015 10:31 AM

## 2015-11-26 NOTE — ED Notes (Signed)
Dr. Allen notified of elevated troponin 

## 2015-11-26 NOTE — ED Provider Notes (Signed)
[facility] DEPT Provider Note   CSN: [id number] Arrival date & time: 11/26/15  [id number]     History   Chief Complaint Chief Complaint  Patient presents with  . Altered Mental Status    HPI Tammy Beard is a 75 y.o. female.  75 year old female presents from nursing home due to decreased mental status. Seen in ED yesterday and diagnosed with UTI start on Levaquin. Since that time she has not been his response was normal. Per the nursing home record, patient has had decreased oral intake. EMS reported possible facial droop but no real focal deficits. No further history obtainable due to her current state      Past Medical History:  Diagnosis Date  . Colitis   . Ecchymosis 11/15/2015  . GERD (gastroesophageal reflux disease)   . Heart murmur   . Hyperlipidemia   . Hypertension   . Osteopenia    s/p 7 yrs Fosamax  . Polio   . Polymyalgia ([place])   . Rheumatic fever   . Sleep disorder   . Stroke ([place])   . Ulcerative colitis     Patient Active Problem List   Diagnosis Date Noted  . GERD (gastroesophageal reflux disease) 11/22/2015  . Malaise 11/22/2015  . Hyponatremia 11/22/2015  . Ecchymosis 11/15/2015  . Palpitations 11/02/2015  . Atrial tachycardia ([place]) 09/25/2015  . UTI (urinary tract infection) 09/22/2015  . Cerebrovascular accident (CVA) ([place])   . Disequilibrium   . Dizzy 09/18/2015  . 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 Diastasis recti 01/23/2015  . Pre-diabetes 03/07/2014  . Heart murmur 03/07/2014  . Polymyalgia rheumatica ([place]) 12/08/2013  . Disturbance in sleep behavior 06/28/2009  . Urinary frequency 06/28/2009  . Dyslipidemia 11/09/2008  . Essential hypertension 06/18/2007  . ALLERGIC RHINITIS 06/18/2007  . Ulcerative colitis ([place]) 06/08/2007  . OSTEOPENIA 06/08/2007  . MURMUR 06/08/2007    Past Surgical History:  Procedure Laterality Date  . BREAST LUMPECTOMY  1986   left; benign  . CATARACT EXTRACTION     Left eye   . COLONOSCOPY   2006  . Colonosopy  07/05/13   Dr. Deatra Ina  . DILATION AND CURETTAGE OF UTERUS  12/1999  . MELANOMA EXCISION  2007   left arm  . NECK SURGERY  1986   Benign growth on neck removed  . TONSILLECTOMY      OB History    No data available       Home Medications    Prior to Admission medications   Medication Sig Start Date End Date Taking? Authorizing Provider  aspirin 325 MG tablet Take 1 tablet (325 mg total) by mouth daily. Cont ASA and plavix for 3 months, then plavix daily 09/26/15   Ripudeep Krystal Eaton, MD  atorvastatin (LIPITOR) 10 MG tablet Take 1 tablet (10 mg total) by mouth daily at 6 PM. 09/26/15   Ripudeep K Rai, MD  benazepril-hydrochlorthiazide (LOTENSIN HCT) 20-12.5 MG tablet Take 0.5 tablets by mouth daily.    Historical Provider, MD  Calcium Carbonate-Vit D-Min (CALCIUM 600+D PLUS MINERALS) 600-400 MG-UNIT TABS Take 2 tablets by mouth 3 (three) times daily.    Historical Provider, MD  clopidogrel (PLAVIX) 75 MG tablet Take 1 tablet (75 mg total) by mouth daily. 09/26/15   Ripudeep Krystal Eaton, MD  dimenhyDRINATE (DRAMAMINE) 50 MG tablet Take 1 tablet every 6 hours as needed for nausea or dizziness 09/18/15   [name], MD  fluticasone [facility]) 50 MCG/ACT nasal spray Place 1 spray into both nostrils daily  as needed for allergies or rhinitis. Reported on 09/28/2015    Historical Provider, MD  hydroxypropyl methylcellulose / hypromellose (ISOPTO TEARS / GONIOVISC) 2.5 % ophthalmic solution Place 1 drop into both eyes as needed for dry eyes.    Historical Provider, MD  levofloxacin (LEVAQUIN) 750 MG tablet Take 1 tablet (750 mg total) by mouth daily. X 7 days 11/25/15   Veryl Speak, MD  mesalamine (LIALDA) 1.2 g EC tablet take 2 tablets by mouth daily with BREAKFAST 04/26/15   [name], MD  metoprolol tartrate (LOPRESSOR) 25 MG tablet Take 1 tablet (25 mg total) by mouth 2 (two) times daily. 09/26/15   Ripudeep Krystal Eaton, MD  Multiple Vitamin (MULTIVITAMIN) tablet Take 1 tablet by mouth  daily.      Historical Provider, MD  predniSONE (DELTASONE) 5 MG tablet TWO TABLETs DAILY TO TREAT POLYMYALGIA RHEUMATICA 08/28/15   [name], MD  ranitidine (ZANTAC) 150 MG tablet Take 150 mg by mouth. Take one tablet at bedtime    Historical Provider, MD  sennosides-docusate sodium (SENOKOT-S) 8.6-50 MG tablet Take 1 tablet by mouth. Take one tablet three times daily for constipation-hold for loose stools    Historical Provider, MD    Family History Family History  Problem Relation Age of Onset  . Pancreatic cancer Other   . Other Mother     Coad/ Bronchiectosis  . Diabetes Paternal Grandmother   . Heart failure Paternal Grandmother   . Cancer Paternal Aunt     unaware of primary site  . Coronary artery disease Neg Hx   . Colon cancer Neg Hx   . Rectal cancer Neg Hx   . Stomach cancer Neg Hx   . Heart attack Neg Hx     Social History Social History  Substance Use Topics  . Smoking status: Never Smoker  . Smokeless tobacco: Never Used  . Alcohol use Yes     Comment: occasional     Allergies   Review of patient's allergies indicates no known allergies.   Review of Systems Review of Systems  Unable to perform ROS: Acuity of condition     Physical Exam Updated Vital Signs Pulse 115   Temp 100.7 F (38.2 C) (Rectal)   Resp 18   Ht 5\' 1"  (1.549 m)   Wt 53.5 kg   SpO2 99%   BMI 22.30 kg/m   Physical Exam  Constitutional: She appears well-developed and well-nourished. She appears lethargic.  Non-toxic appearance. No distress.  HENT:  Head: Normocephalic and atraumatic.  Eyes: Conjunctivae, EOM and lids are normal. Pupils are equal, round, and reactive to light.  Neck: Normal range of motion. Neck supple. No tracheal deviation present. No thyroid mass present.  Cardiovascular: Normal rate, regular rhythm and normal heart sounds.  Exam reveals no gallop.   No murmur heard. Pulmonary/Chest: Effort normal and breath sounds normal. No stridor. No respiratory  distress. She has no decreased breath sounds. She has no wheezes. She has no rhonchi. She has no rales.  Abdominal: Soft. Normal appearance and bowel sounds are normal. She exhibits no distension. There is no tenderness. There is no rebound and no CVA tenderness.  Musculoskeletal: Normal range of motion. She exhibits no edema or tenderness.  Neurological: She appears lethargic. She is disoriented. No cranial nerve deficit. GCS eye subscore is 4. GCS verbal subscore is 4. GCS motor subscore is 5.  Skin: Skin is warm and dry. No abrasion and no rash noted.  Psychiatric: Her affect is  blunt. Her speech is delayed. She is slowed.  Nursing note and vitals reviewed.    ED Treatments / Results  Labs (all labs ordered are listed, but only abnormal results are displayed) Labs Reviewed  CULTURE, BLOOD (ROUTINE X 2)  CULTURE, BLOOD (ROUTINE X 2)  URINE CULTURE  COMPREHENSIVE METABOLIC PANEL  CBC WITH DIFFERENTIAL/PLATELET  URINALYSIS, ROUTINE W REFLEX MICROSCOPIC (NOT AT [facility])  TROPONIN I  I-STAT CG4 LACTIC ACID, ED    EKG  EKG Interpretation None       Radiology Dg Chest 2 View  Result Date: 11/25/2015 CLINICAL DATA:  Acute onset of generalized weakness. Initial encounter. EXAM: CHEST  2 VIEW COMPARISON:  Chest radiograph performed 09/21/2015 FINDINGS: The lungs are hyperexpanded, with flattening of the hemidiaphragms, compatible with COPD. Mild left basilar atelectasis is noted. There is no evidence of pleural effusion or pneumothorax. The heart is borderline enlarged. No acute osseous abnormalities are seen. Postoperative change is noted overlying the mediastinum. IMPRESSION: Mild left basilar atelectasis noted. Borderline cardiomegaly. Findings of COPD. Electronically Signed   By: Garald Balding M.D.   On: 11/25/2015 06:12   Ct Head Wo Contrast  Result Date: 11/25/2015 CLINICAL DATA:  Acute onset of generalized weakness and dizziness. Initial encounter. EXAM: CT HEAD WITHOUT CONTRAST  TECHNIQUE: Contiguous axial images were obtained from the base of the skull through the vertex without intravenous contrast. COMPARISON:  CTA of the head performed 09/24/2015, and MRA of the brain performed 09/22/2015 FINDINGS: Brain: No evidence of acute infarction, hemorrhage, hydrocephalus, extra-axial collection or mass lesion/mass effect. Prominence of ventricles and sulci reflects mild to moderate cortical volume loss. Cerebellar atrophy is noted. Scattered periventricular white matter change likely reflects small vessel ischemic microangiopathy. The brainstem and fourth ventricle are within normal limits. The basal ganglia are unremarkable in appearance. The cerebral hemispheres demonstrate grossly normal gray-white differentiation. No mass effect or midline shift is seen. Vascular: No hyperdense vessel or unexpected calcification. Skull: There is no evidence of fracture; visualized osseous structures are unremarkable in appearance. Sinuses/Orbits: The orbits are within normal limits. The paranasal sinuses are well-aerated. There is partial opacification of the mastoid air cells bilaterally. Other: No significant soft tissue abnormalities are seen. IMPRESSION: 1. No acute intracranial pathology seen on CT. 2. Mild to moderate cortical volume loss and scattered small vessel ischemic microangiopathy. 3. Partial opacification of the mastoid air cells bilaterally. Electronically Signed   By: Garald Balding M.D.   On: 11/25/2015 06:27    Procedures Procedures (including critical care time)  Medications Ordered in ED Medications  0.9 %  sodium chloride infusion (not administered)     Initial Impression / Assessment and Plan / ED Course  I have reviewed the triage vital signs and the nursing notes.  Pertinent labs & imaging results that were available during my care of the patient were reviewed by me and considered in my medical decision making (see chart for details).  Clinical Course    Patient  with evidence of severe hyponatremia on her left lites. Is receiving IV fluid this time. Does have a temperature. Had recently been seen for a UTI and so patient was started on presumed urosepsis antibiotics. Blood pressures remain stable. Will be admitted to stepdown   CRITICAL CARE Performed by: Leota Jacobsen Total critical care time: 45 minutes Critical care time was exclusive of separately billable procedures and treating other patients. Critical care was necessary to treat or prevent imminent or life-threatening deterioration. Critical care was time spent personally by  me on the following activities: development of treatment plan with patient and/or surrogate as well as nursing, discussions with consultants, evaluation of patient's response to treatment, examination of patient, obtaining history from patient or surrogate, ordering and performing treatments and interventions, ordering and review of laboratory studies, ordering and review of radiographic studies, pulse oximetry and re-evaluation of patient's condition.   Final Clinical Impressions(s) / ED Diagnoses   Final diagnoses:  None    New Prescriptions New Prescriptions   No medications on file     Lacretia Leigh, MD 11/26/15 1224

## 2015-11-26 NOTE — ED Triage Notes (Signed)
Pt. Coming from [facility] assisted living via [facility] for altered mental status. RN at facility reports last seen normal Friday. Records indicate patient was here yesterday morning for the same. EMS reports possible left sided mouth droop. Pt. Very limited verbal at this time, but facility reports she talks in full conversations at baseline. Pt. Was given 162mL D5W through butterfly needle in right wrist to 'perk her up'. Pt. EKG irregular. Pt. Noted to still be in hospital gown from yesterday's encounter. EDP at bedside.

## 2015-11-27 DIAGNOSIS — K51918 Ulcerative colitis, unspecified with other complication: Secondary | ICD-10-CM

## 2015-11-27 DIAGNOSIS — I5042 Chronic combined systolic (congestive) and diastolic (congestive) heart failure: Secondary | ICD-10-CM | POA: Diagnosis present

## 2015-11-27 DIAGNOSIS — N179 Acute kidney failure, unspecified: Secondary | ICD-10-CM

## 2015-11-27 DIAGNOSIS — A498 Other bacterial infections of unspecified site: Secondary | ICD-10-CM | POA: Diagnosis present

## 2015-11-27 DIAGNOSIS — E876 Hypokalemia: Secondary | ICD-10-CM | POA: Diagnosis present

## 2015-11-27 DIAGNOSIS — R269 Unspecified abnormalities of gait and mobility: Secondary | ICD-10-CM | POA: Insufficient documentation

## 2015-11-27 LAB — CBC
HCT: 36.4 % (ref 36.0–46.0)
Hemoglobin: 12 g/dL (ref 12.0–15.0)
MCH: 29.5 pg (ref 26.0–34.0)
MCHC: 33 g/dL (ref 30.0–36.0)
MCV: 89.4 fL (ref 78.0–100.0)
PLATELETS: 163 10*3/uL (ref 150–400)
RBC: 4.07 MIL/uL (ref 3.87–5.11)
RDW: 12.4 % (ref 11.5–15.5)
WBC: 11.5 10*3/uL — AB (ref 4.0–10.5)

## 2015-11-27 LAB — COMPREHENSIVE METABOLIC PANEL
ALBUMIN: 3.4 g/dL — AB (ref 3.5–5.0)
ALT: 16 U/L (ref 14–54)
AST: 18 U/L (ref 15–41)
Alkaline Phosphatase: 57 U/L (ref 38–126)
Anion gap: 11 (ref 5–15)
BUN: 18 mg/dL (ref 6–20)
CHLORIDE: 93 mmol/L — AB (ref 101–111)
CO2: 26 mmol/L (ref 22–32)
CREATININE: 1.02 mg/dL — AB (ref 0.44–1.00)
Calcium: 8.3 mg/dL — ABNORMAL LOW (ref 8.9–10.3)
GFR calc Af Amer: >60 mL/min (ref >60)
GFR calc non Af Amer: 52 mL/min — ABNORMAL LOW (ref >60)
Glucose, Bld: 117 mg/dL — ABNORMAL HIGH (ref 65–99)
POTASSIUM: 2.4 mmol/L — AB (ref 3.5–5.1)
SODIUM: 130 mmol/L — AB (ref 135–145)
Total Bilirubin: 0.9 mg/dL (ref 0.3–1.2)
Total Protein: 6.1 g/dL — ABNORMAL LOW (ref 6.5–8.1)

## 2015-11-27 LAB — URINE CULTURE
Culture: >=100000 — AB
Culture: NO GROWTH

## 2015-11-27 LAB — MAGNESIUM
MAGNESIUM: 1.6 mg/dL — AB (ref 1.7–2.4)
Magnesium: 1.7 mg/dL (ref 1.7–2.4)

## 2015-11-27 LAB — POTASSIUM: Potassium: 3.2 mmol/L — ABNORMAL LOW (ref 3.5–5.1)

## 2015-11-27 MED ORDER — POTASSIUM CHLORIDE 10 MEQ/100ML IV SOLN
10.0000 meq | INTRAVENOUS | Status: AC
  Administered 2015-11-27 (×4): 10 meq via INTRAVENOUS
  Filled 2015-11-27 (×3): qty 100

## 2015-11-27 MED ORDER — METOPROLOL TARTRATE 12.5 MG HALF TABLET
12.5000 mg | ORAL_TABLET | Freq: Two times a day (BID) | ORAL | Status: DC
  Administered 2015-11-27 – 2015-11-29 (×5): 12.5 mg via ORAL
  Filled 2015-11-27 (×5): qty 1

## 2015-11-27 MED ORDER — POTASSIUM CHLORIDE 10 MEQ/100ML IV SOLN
10.0000 meq | INTRAVENOUS | Status: AC
  Administered 2015-11-27 (×4): 10 meq via INTRAVENOUS
  Filled 2015-11-27 (×4): qty 100

## 2015-11-27 MED ORDER — MAGNESIUM SULFATE 2 GM/50ML IV SOLN
2.0000 g | Freq: Once | INTRAVENOUS | Status: AC
  Administered 2015-11-27: 2 g via INTRAVENOUS
  Filled 2015-11-27: qty 50

## 2015-11-27 NOTE — Progress Notes (Signed)
CRITICAL VALUE ALERT  Critical value received:  Potassium 2.4  Date of notification:  11/27/2015  Time of notification:  0315  Critical value read back: yes  Nurse who received alert:  Remo Lipps, RN   MD aware, pt is currently receiving K runs

## 2015-11-27 NOTE — Progress Notes (Addendum)
PROGRESS NOTE    Tammy Beard  [id number] DOB: 1940/11/14 DOA: 11/26/2015 PCP: Jeanmarie Hubert, MD   Brief Narrative:   75 y.o.WF PMHx Easy bruising, GERD, HIV, HTN, Osteopenia, Polio, Polymyalgia Rheumatic, Rheumatic fever, CVA, Ulcerative colitis   Presenting in all altered mental state. Level V caveat applies as patient is unable to provide any reliable history at this time. History provided by assisted living facility report, EDP and patient's sister.  Patient was in her normal state of health until approximately 1 day ago when she developed confusion. His condition worsened over the last 24 hours. Patient with little to no oral intake. No focal complaint. No reported fevers or vital sign instability from the nursing home. Patient seen one day ago for treatment of the UTI and was on Levaquin at that time. EMS reported possible left facial droop though this was not noted by family member or by nursing home staff.    Subjective: 9/5 A/O 2 ( does not know where, when). Negative N/V/abdominal pain. Sitting comfortably in bed eating breakfast.    Assessment & Plan:   Active Problems:   Dyslipidemia   Essential hypertension   Ulcerative colitis ([place])   Polymyalgia rheumatica (HCC)   Sepsis secondary to UTI (HCC)   GERD (gastroesophageal reflux disease)   Hyponatremia   Sepsis ([place])   Sepsis due to Gram negative bacteria (H[facility])   Citrobacter infection   Hypokalemia   Chronic combined systolic and diastolic CHF (congestive heart failure) (HCC)   Sepsis/UTI positive CITROBACTER FREUNDII -Sepsis protocol - On admission DC Levaquin.  -Blood culture and urine culture pending -Continue current antibiotic. Complete 5 day course, will need to reassess length of antibiotic treatment if blood cultures become positive.  Hyponatremia: 123 on admission.  -Downtrending over past 2 months. Suspect from recurrent illnesses and poor oral Na intake.  Recent Labs Lab 11/25/15 0512  11/26/15 1013 11/26/15 2300 11/27/15 0243  NA 126* 123* 127* 130*  -Improving with hydration.  Hypokalemia  -Potassium IV 40 mEq -Repeat K/Mg at 1400  Hypomagnesmia -Magnesium Goal>2   AKI: Cr 1.26 on admission( Baseline 1).  -Suspect from poor oral intake and urosepsis - Normal saline 100 ml Lab Results  Component Value Date   CREATININE 1.02 (H) 11/27/2015   CREATININE 1.08 (H) 11/26/2015   CREATININE 1.26 (H) 11/26/2015  -Returned to baseline.  Chronic systolic and diastolic CHF -Echocardiogram pending -Strict in and out -Daily weight  Elevated Troponin: 0.06.  -Most likely demand ischemia Likely demand. - Troponin 2 elevated: Patient without CP/SOB   GERD: - continue H2 blocker  HTN: - Continue to hold Benazepril -HCTZ, secondary to AKI - Start metoprolol 12.5 mg BID (home dose 25 mg)   PMR:  - continue daily prednisone 5mg   UC: - continue Mesalamine 1.2 gm daily  Previous CVA:  -no lasting deficits. CT on 9/3 an d9/4 w/o acute abnormality. No focal abnormalities noted on admission.  - Monitor  Hypokalemia -Potassium goal> 4 -Potassium IV 40 mEq -Repeat K/Mg at 1400; continue low potassium potassium IV 40 mEq  Hypomagnesemia -Magnesium goal> 2 -Magnesium IV 2 gm     DVT prophylaxis: Heparin Code Status: DO NOT RESUSCITATE Family Communication: None  Disposition Plan: Resolution sepsis   Consultants:  None  Procedures/Significant Events:  Echocardiogram pending  Cultures 9/3 urine (ED visit) positive CITROBACTER FREUNDII 9/4 blood 2 pending 9/4 urine 2 pending 9/4 MRSA by PCR negative  Antimicrobials: Levofloxacin 9/3>> 9/4 Cefepime 9/4>>   Devices None  LINES / TUBES:  None    Continuous Infusions: . sodium chloride 100 mL/hr at 11/27/15 0513     Objective: Vitals:   11/27/15 0500 11/27/15 0600 11/27/15 0738 11/27/15 0820  BP: (!) 144/39 (!) 120/45 (!) 139/54   Pulse: (!) 51 73 (!) 42 70  Resp: 17  (!) 22    Temp:   98.2 F (36.8 C)   TempSrc:   Oral   SpO2: 98% 98% 100%   Weight:      Height:        Intake/Output Summary (Last 24 hours) at 11/27/15 1030 Last data filed at 11/27/15 0603  Gross per 24 hour  Intake           3972.5 ml  Output              500 ml  Net           3472.5 ml   Filed Weights   11/26/15 1012  Weight: 53.5 kg (118 lb)    Examination:  General: A/O 2 ( does not know where, when), No acute respiratory distress Eyes: negative scleral hemorrhage, negative anisocoria, negative icterus ENT: Negative Runny nose, negative gingival bleeding, Neck:  Negative scars, masses, torticollis, lymphadenopathy, JVD Lungs: Clear to auscultation bilaterally without wheezes or crackles Cardiovascular: Regular rate and rhythm without murmur gallop or rub normal S1 and S2 Abdomen: negative abdominal pain, nondistended, positive soft, bowel sounds, no rebound, no ascites, no appreciable mass Extremities: No significant cyanosis, clubbing, or edema bilateral lower extremities Skin: Negative rashes, lesions, ulcers Psychiatric:  Negative depression, negative anxiety, negative fatigue, negative mania  Central nervous system:  Cranial nerves II through XII intact, tongue/uvula midline, all extremities muscle strength 5/5, sensation intact throughout, negative dysarthria, negative expressive aphasia, negative receptive aphasia.Dictation #1 [id number]  [id number]       Data Reviewed: Care during the described time interval was provided by me .  I have reviewed this patient's available data, including medical history, events of note, physical examination, and all test results as part of my evaluation. I have personally reviewed and interpreted all radiology studies.  CBC:  Recent Labs Lab 11/25/15 0512 11/26/15 1013 11/27/15 0243  WBC 9.9 13.3* 11.5*  NEUTROABS  --  11.3*  --   HGB 13.0 13.4 12.0  HCT 39.3 39.1 36.4  MCV 90.1 88.1 89.4  PLT 185 209 [id number]    Basic Metabolic Panel:  Recent Labs Lab 11/25/15 0512 11/26/15 1013 11/26/15 2300 11/27/15 0243  NA 126* 123* 127* 130*  K 3.5 3.5 2.8* 2.4*  CL 87* 85* 95* 93*  CO2 27 26 27 26   GLUCOSE 139* 173* 133* 117*  BUN 17 24* 21* 18  CREATININE 1.03* 1.26* 1.08* 1.02*  CALCIUM 9.1 10.7* 8.6* 8.3*  MG  --   --   --  1.6*   GFR: Estimated Creatinine Clearance: 35.4 mL/min (by C-G formula based on SCr of 1.02 mg/dL). Liver Function Tests:  Recent Labs Lab 11/25/15 0512 11/26/15 1013 11/27/15 0243  AST 31 21 18   ALT 20 20 16   ALKPHOS 75 64 57  BILITOT 1.0 0.8 0.9  PROT 7.5 7.3 6.1*  ALBUMIN 4.1 4.0 3.4*   No results for input(s): LIPASE, AMYLASE in the last 168 hours. No results for input(s): AMMONIA in the last 168 hours. Coagulation Profile:  Recent Labs Lab 11/26/15 1640  INR 1.08   Cardiac Enzymes:  Recent Labs Lab 11/26/15 1013 11/26/15 1640 11/26/15 2300  TROPONINI 0.06* 0.07*  0.07*   BNP (last 3 results) No results for input(s): PROBNP in the last 8760 hours. HbA1C: No results for input(s): HGBA1C in the last 72 hours. CBG: No results for input(s): GLUCAP in the last 168 hours. Lipid Profile: No results for input(s): CHOL, HDL, LDLCALC, TRIG, CHOLHDL, LDLDIRECT in the last 72 hours. Thyroid Function Tests: No results for input(s): TSH, T4TOTAL, FREET4, T3FREE, THYROIDAB in the last 72 hours. Anemia Panel: No results for input(s): VITAMINB12, FOLATE, FERRITIN, TIBC, IRON, RETICCTPCT in the last 72 hours. Urine analysis:    Component Value Date/Time   COLORURINE YELLOW 11/26/2015 1021   APPEARANCEUR CLEAR 11/26/2015 1021   LABSPEC 1.024 11/26/2015 1021   PHURINE 6.0 11/26/2015 1021   GLUCOSEU NEGATIVE 11/26/2015 1021   HGBUR SMALL (A) 11/26/2015 1021   HGBUR negative 09/06/2009 1459   BILIRUBINUR NEGATIVE 11/26/2015 1021   BILIRUBINUR NEG 08/23/2010 1440   KETONESUR 15 (A) 11/26/2015 1021   PROTEINUR 100 (A) 11/26/2015 1021   UROBILINOGEN  0.2 08/23/2010 1440   UROBILINOGEN 0.2 09/06/2009 1459   NITRITE NEGATIVE 11/26/2015 1021   LEUKOCYTESUR NEGATIVE 11/26/2015 1021   Sepsis Labs: @LABRCNTIP (procalcitonin:4,lacticidven:4)  ) Recent Results (from the past 240 hour(s))  Urine culture     Status: Abnormal   Collection Time: 11/25/15  5:35 AM  Result Value Ref Range Status   Specimen Description URINE, CATHETERIZED  Final   Special Requests NONE  Final   Culture >=100,000 COLONIES/mL CITROBACTER FREUNDII (A)  Final   Report Status 11/27/2015 FINAL  Final   Organism ID, Bacteria CITROBACTER FREUNDII (A)  Final      Susceptibility   Citrobacter freundii - MIC*    CEFAZOLIN >=64 RESISTANT Resistant     CEFTRIAXONE <=1 SENSITIVE Sensitive     CIPROFLOXACIN <=0.25 SENSITIVE Sensitive     GENTAMICIN <=1 SENSITIVE Sensitive     IMIPENEM 1 SENSITIVE Sensitive     NITROFURANTOIN <=16 SENSITIVE Sensitive     TRIMETH/SULFA <=20 SENSITIVE Sensitive     PIP/TAZO <=4 SENSITIVE Sensitive     * >=100,000 COLONIES/mL CITROBACTER FREUNDII  MRSA PCR Screening     Status: None   Collection Time: 11/26/15  2:40 PM  Result Value Ref Range Status   MRSA by PCR NEGATIVE NEGATIVE Final    Comment:        The GeneXpert MRSA Assay (FDA approved for NASAL specimens only), is one component of a comprehensive MRSA colonization surveillance program. It is not intended to diagnose MRSA infection nor to guide or monitor treatment for MRSA infections.          Radiology Studies: Ct Head Wo Contrast  Result Date: 11/26/2015 CLINICAL DATA:  75 year old female with altered mental status. EXAM: CT HEAD WITHOUT CONTRAST TECHNIQUE: Contiguous axial images were obtained from the base of the skull through the vertex without intravenous contrast. COMPARISON:  11/25/2015 and prior CTs dating back to 09/21/2015 FINDINGS: Brain: No evidence of acute infarction, hemorrhage, hydrocephalus, extra-axial collection or mass lesion/mass effect. Atrophy  and chronic small-vessel white matter ischemic changes again identified. Vascular: Intracranial atherosclerotic calcifications noted. Skull: Unremarkable Sinuses/Orbits: Visualized portions unremarkable. Other: None IMPRESSION: No evidence of acute intracranial abnormality. Atrophy and chronic small-vessel white matter ischemic changes. Electronically Signed   By: Margarette Canada M.D.   On: 11/26/2015 11:25   Dg Chest Port 1 View  Result Date: 11/26/2015 CLINICAL DATA:  75 year old female with a history of altered mental status EXAM: PORTABLE CHEST 1 VIEW COMPARISON:  11/25/2015, 09/21/2015 FINDINGS: Cardiomediastinal silhouette  unchanged in size and contour. Calcifications of the aortic arch. Since the prior there has been removal of the event recorder on the chest wall. Stigmata of emphysema, with flattened hemidiaphragms, and hyperinflation. Chronic interstitial opacities persist. Pleural parenchymal thickening at the lung apices. No pleural effusion or confluent airspace disease.  No pneumothorax. No displaced fracture. IMPRESSION: Chronic lung changes and emphysema without evidence of superimposed acute cardiopulmonary disease. Aortic atherosclerosis. Signed, Dulcy Fanny. Earleen Newport, DO [facility] Electronically Signed   By: Corrie Mckusick D.O.   On: 11/26/2015 10:37        Scheduled Meds: . atorvastatin  10 mg Oral q1800  . ceFEPime (MAXIPIME) IV  1 g Intravenous Q24H  . clopidogrel  75 mg Oral Daily  . famotidine  20 mg Oral Daily  . heparin  5,000 Units Subcutaneous Q8H  . mesalamine  1.2 g Oral Q breakfast  . metoprolol tartrate  12.5 mg Oral BID  . predniSONE  5 mg Oral Q breakfast  . sodium chloride flush  3 mL Intravenous Q12H   Continuous Infusions: . sodium chloride 100 mL/hr at 11/27/15 0513     LOS: 1 day    Time spent: 40 minutes    WOODS, Geraldo Docker, MD [facility] Pager [phone]   If 7PM-7AM, please contact  night-coverage [email or web address] Password [email or web address] 11/27/2015, 10:30 AM

## 2015-11-27 NOTE — Progress Notes (Signed)
Called RN to give report, RN busy, no answer. Will try again

## 2015-11-28 ENCOUNTER — Telehealth (HOSPITAL_BASED_OUTPATIENT_CLINIC_OR_DEPARTMENT_OTHER): Payer: Self-pay | Admitting: Emergency Medicine

## 2015-11-28 ENCOUNTER — Inpatient Hospital Stay (HOSPITAL_COMMUNITY): Payer: Medicare Other

## 2015-11-28 DIAGNOSIS — N390 Urinary tract infection, site not specified: Secondary | ICD-10-CM

## 2015-11-28 DIAGNOSIS — A419 Sepsis, unspecified organism: Secondary | ICD-10-CM

## 2015-11-28 DIAGNOSIS — A4150 Gram-negative sepsis, unspecified: Principal | ICD-10-CM

## 2015-11-28 DIAGNOSIS — E785 Hyperlipidemia, unspecified: Secondary | ICD-10-CM

## 2015-11-28 DIAGNOSIS — E876 Hypokalemia: Secondary | ICD-10-CM

## 2015-11-28 DIAGNOSIS — A498 Other bacterial infections of unspecified site: Secondary | ICD-10-CM

## 2015-11-28 DIAGNOSIS — K5130 Ulcerative (chronic) rectosigmoiditis without complications: Secondary | ICD-10-CM

## 2015-11-28 DIAGNOSIS — K219 Gastro-esophageal reflux disease without esophagitis: Secondary | ICD-10-CM

## 2015-11-28 DIAGNOSIS — I10 Essential (primary) hypertension: Secondary | ICD-10-CM

## 2015-11-28 DIAGNOSIS — I509 Heart failure, unspecified: Secondary | ICD-10-CM

## 2015-11-28 DIAGNOSIS — M353 Polymyalgia rheumatica: Secondary | ICD-10-CM

## 2015-11-28 DIAGNOSIS — I5042 Chronic combined systolic (congestive) and diastolic (congestive) heart failure: Secondary | ICD-10-CM

## 2015-11-28 DIAGNOSIS — E871 Hypo-osmolality and hyponatremia: Secondary | ICD-10-CM

## 2015-11-28 LAB — BASIC METABOLIC PANEL
Anion gap: 11 (ref 5–15)
BUN: 14 mg/dL (ref 6–20)
CALCIUM: 8.4 mg/dL — AB (ref 8.9–10.3)
CO2: 27 mmol/L (ref 22–32)
CREATININE: 0.93 mg/dL (ref 0.44–1.00)
Chloride: 93 mmol/L — ABNORMAL LOW (ref 101–111)
GFR calc non Af Amer: 58 mL/min — ABNORMAL LOW (ref >60)
Glucose, Bld: 171 mg/dL — ABNORMAL HIGH (ref 65–99)
Potassium: 3.3 mmol/L — ABNORMAL LOW (ref 3.5–5.1)
SODIUM: 131 mmol/L — AB (ref 135–145)

## 2015-11-28 LAB — MAGNESIUM: Magnesium: 2.6 mg/dL — ABNORMAL HIGH (ref 1.7–2.4)

## 2015-11-28 MED ORDER — HYDROCHLOROTHIAZIDE 12.5 MG PO CAPS
12.5000 mg | ORAL_CAPSULE | Freq: Every day | ORAL | Status: DC
  Administered 2015-11-29 – 2015-12-03 (×5): 12.5 mg via ORAL
  Filled 2015-11-28 (×5): qty 1

## 2015-11-28 MED ORDER — BENAZEPRIL-HYDROCHLOROTHIAZIDE 20-12.5 MG PO TABS: 0.5000 | ORAL_TABLET | Freq: Every day | ORAL | Status: DC

## 2015-11-28 MED ORDER — POTASSIUM CHLORIDE CRYS ER 20 MEQ PO TBCR
40.0000 meq | EXTENDED_RELEASE_TABLET | Freq: Once | ORAL | Status: AC
  Administered 2015-11-28: 40 meq via ORAL
  Filled 2015-11-28: qty 2

## 2015-11-28 MED ORDER — BENAZEPRIL HCL 20 MG PO TABS
20.0000 mg | ORAL_TABLET | Freq: Every day | ORAL | Status: DC
  Administered 2015-11-29 – 2015-12-03 (×5): 20 mg via ORAL
  Filled 2015-11-28 (×5): qty 1

## 2015-11-28 NOTE — Progress Notes (Signed)
  Echocardiogram 2D Echocardiogram has been performed.  Tammy Beard 11/28/2015, 4:20 PM

## 2015-11-28 NOTE — Care Management Note (Signed)
Case Management Note  Patient Details  Name: Tammy Beard MRN: [id number] Date of Birth: 28-Jan-1941  Subjective/Objective: 75 y.o. F admitted with AMS from [facility].                    Action/Plan: Anticipate discharge to SNF when ready. No further CM needs but will be available should additional discharge needs arise.  Expected Discharge Date:                  Expected Discharge Plan:  Skilled Nursing Facility  In-House Referral:  Clinical Social Work  Discharge planning Services  CM Consult  Post Acute Care Choice:  NA Choice offered to:     DME Arranged:  N/A DME Agency:  NA  HH Arranged:    [place] Agency:  NA  Status of Service:  In process, will continue to follow  If discussed at Long Length of Stay Meetings, dates discussed:    Additional Comments:  Delrae Sawyers, RN 11/28/2015, 2:34 PM

## 2015-11-28 NOTE — Progress Notes (Signed)
Patient AMS has improved. Eating 75% of meal, Fluilds still running with antibiotics, Sister at beside updated and would like to be called in advanced when discharged. [id number]

## 2015-11-28 NOTE — Telephone Encounter (Signed)
Post ED Visit - Positive Culture Follow-up  Culture report reviewed by antimicrobial stewardship pharmacist:  []  Elenor Quinones, Pharm.D. []  Heide Guile, Pharm.D., BCPS []  Parks Neptune, Pharm.D. []  Alycia Rossetti, Pharm.D., BCPS []  [place], [place].D., BCPS, AAHIVP []  Legrand Como, Pharm.D., BCPS, AAHIVP []  Milus Glazier, Pharm.D. []  Stephens November, [place].Amedeo Gory PharmD  Positive urine culture Treated with levofloxacin, organism sensitive to the same and no further patient follow-up is required at this time.  Hazle Nordmann 11/28/2015, 11:08 AM

## 2015-11-28 NOTE — Progress Notes (Addendum)
PROGRESS NOTE    Tammy Beard  [id number] DOB: Sep 06, 1940 DOA: 11/26/2015 PCP: Jeanmarie Hubert, MD   Brief Narrative: Tammy Beard is a 75 year old female with history of easy bruising, GERD,  osteopenia, polio, polymyalgia rheumatica, rheumatic fever, CVA, ulcers colitis. She presented with altered mental status. Symptoms likely secondary to metabolic encephalopathy from urinary tract infection. She has continued to improve with antibiotic therapy.   Assessment & Plan:   Active Problems:   Dyslipidemia   Essential hypertension   Ulcerative colitis ([place])   Polymyalgia rheumatica (HCC)   Sepsis secondary to UTI (HCC)   GERD (gastroesophageal reflux disease)   Hyponatremia   Sepsis ([place])   Sepsis due to Gram negative bacteria (HCC)   Citrobacter infection   Hypokalemia   Chronic combined systolic and diastolic CHF (congestive heart failure) ([place])   Sepsis secondary to urinary tract infection Pyelonephritis Patient appears to be improving well. She has read afebrile for last 24 hours. Urine culture positive for Citrobacter. Sensitivities available. -Continue cefepime, will likely switch to by mouth tomorrow after negative blood culture 48 hours -Continue IV fluids -Blood cultures pending, no growth 1 day  Hyponatremia Improved -Continue normal saline IVF  Hypokalemia Hypomagnesemia Potassium slightly low. A medium has been repleted and his slightly above upper limits of normal. Magnesium goal greater than 2 -Repeat BMP in the morning -Repeat magnesium in the morning -Continue to replete potassium  AKI Appears resolved possibly prerenal azotemia secondary to hydration  Chronic diastolic CHF Asymptomatic. She is tolerating fluids. -Monitor fluid status  Elevated troponin Likely secondary to demand. Patient is asymptomatic. Troponin has remained stable  GERD -Continue Pepcid  Hypertension Blood pressures are slightly above normotensive. -Continue  metoprolol -Restart home Lotensin  Polymyalgia rheumatica -Continue prednisone 5 mg  Ulcerative colitis -Continue mesalamine 1.2 g daily  History of CVA -Continue atorvastatin  DVT prophylaxis: Heparin subcutaneous Code Status: DO NOT RESUSCITATE  Family Communication: Discussed with brother, Laurey Arrow, on the phone. Disposition Plan: Likely discharge tomorrow to SNF or ALF   Consultants:  Pharmacy   Procedures:  Echo pending  Antimicrobials:  Cefepime (9/4>>   Subjective: She reports no complaints overnight. She is ready to go home when able.   Objective: Vitals:   11/27/15 1400 11/27/15 1944 11/28/15 0602 11/28/15 1300  BP: (!) 180/68 (!) 172/59 128/85 (!) 185/64  Pulse: (!) 51 82 93 80  Resp: 19 18 18 18   Temp: 98.3 F (36.8 C) 98 F (36.7 C) 97.9 F (36.6 C) 98.3 F (36.8 C)  TempSrc: Oral Oral Oral Oral  SpO2: 100% 100% 100% 98%  Weight: 52.1 kg (114 lb 14.4 oz)  51.8 kg (114 lb 3.2 oz)   Height: 5\' 4"  (1.626 m)       Intake/Output Summary (Last 24 hours) at 11/28/15 1337 Last data filed at 11/28/15 0900  Gross per 24 hour  Intake              600 ml  Output              220 ml  Net              380 ml   Filed Weights   11/26/15 1012 11/27/15 1400 11/28/15 0602  Weight: 53.5 kg (118 lb) 52.1 kg (114 lb 14.4 oz) 51.8 kg (114 lb 3.2 oz)    Examination:  General exam: Appears calm and comfortable Respiratory system: Clear to auscultation. Respiratory effort normal. Cardiovascular system: S1 & S2 heard, RRR. No  murmurs, rubs, gallops or clicks Gastrointestinal system: Abdomen is nondistended, soft and nontender. No organomegaly or masses felt. Normal bowel sounds heard. Central nervous system: Alert and oriented x3 Extremities: No cyanosis. No lower extremity edema.  Skin: No rashes, lesions or ulcers Psychiatry: Judgement and insight appear normal. Mood & affect appropriate.     Data Reviewed: I have personally reviewed following labs and  imaging studies  CBC:  Recent Labs Lab 11/25/15 0512 11/26/15 1013 11/27/15 0243  WBC 9.9 13.3* 11.5*  NEUTROABS  --  11.3*  --   HGB 13.0 13.4 12.0  HCT 39.3 39.1 36.4  MCV 90.1 88.1 89.4  PLT 185 209 [id number]   Basic Metabolic Panel:  Recent Labs Lab 11/25/15 0512 11/26/15 1013 11/26/15 2300 11/27/15 0243 11/27/15 1435 11/28/15 0849  NA 126* 123* 127* 130*  --  131*  K 3.5 3.5 2.8* 2.4* 3.2* 3.3*  CL 87* 85* 95* 93*  --  93*  CO2 27 26 27 26   --  27  GLUCOSE 139* 173* 133* 117*  --  171*  BUN 17 24* 21* 18  --  14  CREATININE 1.03* 1.26* 1.08* 1.02*  --  0.93  CALCIUM 9.1 10.7* 8.6* 8.3*  --  8.4*  MG  --   --   --  1.6* 1.7 2.6*   GFR: Estimated Creatinine Clearance: 42.1 mL/min (by C-G formula based on SCr of 0.93 mg/dL). Liver Function Tests:  Recent Labs Lab 11/25/15 0512 11/26/15 1013 11/27/15 0243  AST 31 21 18   ALT 20 20 16   ALKPHOS 75 64 57  BILITOT 1.0 0.8 0.9  PROT 7.5 7.3 6.1*  ALBUMIN 4.1 4.0 3.4*   No results for input(s): LIPASE, AMYLASE in the last 168 hours. No results for input(s): AMMONIA in the last 168 hours. Coagulation Profile:  Recent Labs Lab 11/26/15 1640  INR 1.08   Cardiac Enzymes:  Recent Labs Lab 11/26/15 1013 11/26/15 1640 11/26/15 2300  TROPONINI 0.06* 0.07* 0.07*   BNP (last 3 results) No results for input(s): PROBNP in the last 8760 hours. HbA1C: No results for input(s): HGBA1C in the last 72 hours. CBG: No results for input(s): GLUCAP in the last 168 hours. Lipid Profile: No results for input(s): CHOL, HDL, LDLCALC, TRIG, CHOLHDL, LDLDIRECT in the last 72 hours. Thyroid Function Tests: No results for input(s): TSH, T4TOTAL, FREET4, T3FREE, THYROIDAB in the last 72 hours. Anemia Panel: No results for input(s): VITAMINB12, FOLATE, FERRITIN, TIBC, IRON, RETICCTPCT in the last 72 hours. Sepsis Labs:  Recent Labs Lab 11/26/15 1020 11/26/15 1640 11/26/15 1851  PROCALCITON  --  <0.10  --   LATICACIDVEN  1.46 0.9 0.9    Recent Results (from the past 240 hour(s))  Urine culture     Status: Abnormal   Collection Time: 11/25/15  5:35 AM  Result Value Ref Range Status   Specimen Description URINE, CATHETERIZED  Final   Special Requests NONE  Final   Culture >=100,000 COLONIES/mL CITROBACTER FREUNDII (A)  Final   Report Status 11/27/2015 FINAL  Final   Organism ID, Bacteria CITROBACTER FREUNDII (A)  Final      Susceptibility   Citrobacter freundii - MIC*    CEFAZOLIN >=64 RESISTANT Resistant     CEFTRIAXONE <=1 SENSITIVE Sensitive     CIPROFLOXACIN <=0.25 SENSITIVE Sensitive     GENTAMICIN <=1 SENSITIVE Sensitive     IMIPENEM 1 SENSITIVE Sensitive     NITROFURANTOIN <=16 SENSITIVE Sensitive     TRIMETH/SULFA <=20 SENSITIVE Sensitive  PIP/TAZO <=4 SENSITIVE Sensitive     * >=100,000 COLONIES/mL CITROBACTER FREUNDII  Blood Culture (routine x 2)     Status: None (Preliminary result)   Collection Time: 11/26/15 10:19 AM  Result Value Ref Range Status   Specimen Description BLOOD RIGHT ANTECUBITAL  Final   Special Requests BOTTLES DRAWN AEROBIC AND ANAEROBIC 5CC  Final   Culture NO GROWTH 1 DAY  Final   Report Status PENDING  Incomplete  Urine culture     Status: None   Collection Time: 11/26/15 10:21 AM  Result Value Ref Range Status   Specimen Description URINE, CATHETERIZED  Final   Special Requests NONE  Final   Culture NO GROWTH  Final   Report Status 11/27/2015 FINAL  Final  Blood Culture (routine x 2)     Status: None (Preliminary result)   Collection Time: 11/26/15 10:45 AM  Result Value Ref Range Status   Specimen Description BLOOD RIGHT FOREARM  Final   Special Requests BOTTLES DRAWN AEROBIC AND ANAEROBIC 2.5CC  Final   Culture NO GROWTH 1 DAY  Final   Report Status PENDING  Incomplete  MRSA PCR Screening     Status: None   Collection Time: 11/26/15  2:40 PM  Result Value Ref Range Status   MRSA by PCR NEGATIVE NEGATIVE Final    Comment:        The GeneXpert MRSA  Assay (FDA approved for NASAL specimens only), is one component of a comprehensive MRSA colonization surveillance program. It is not intended to diagnose MRSA infection nor to guide or monitor treatment for MRSA infections.          Radiology Studies: No results found.      Scheduled Meds: . atorvastatin  10 mg Oral q1800  . ceFEPime (MAXIPIME) IV  1 g Intravenous Q24H  . clopidogrel  75 mg Oral Daily  . famotidine  20 mg Oral Daily  . heparin  5,000 Units Subcutaneous Q8H  . mesalamine  1.2 g Oral Q breakfast  . metoprolol tartrate  12.5 mg Oral BID  . predniSONE  5 mg Oral Q breakfast  . sodium chloride flush  3 mL Intravenous Q12H   Continuous Infusions: . sodium chloride 100 mL/hr at 11/27/15 1624     LOS: 2 days     Cordelia Poche, MD [facility] 11/28/2015, 1:37 PM Pager: [phone]  If 7PM-7AM, please contact night-coverage [email or web address] Password [email or web address] 11/28/2015, 1:37 PM

## 2015-11-29 DIAGNOSIS — R7989 Other specified abnormal findings of blood chemistry: Secondary | ICD-10-CM

## 2015-11-29 LAB — ECHOCARDIOGRAM LIMITED
FS: 31 % (ref 28–44)
HEIGHTINCHES: 64 in
IVS/LV PW RATIO, ED: 1.1
LA ID, A-P, ES: 29 mm
LA diam end sys: 29 mm
LA diam index: 1.88 cm/m2
LDCA: 2.27 cm2
LV PW d: 10.5 mm — AB (ref 0.6–1.1)
LVOT diameter: 17 mm
WEIGHTICAEL: 1827.17 [oz_av]

## 2015-11-29 LAB — BASIC METABOLIC PANEL
ANION GAP: 8 (ref 5–15)
BUN: 13 mg/dL (ref 6–20)
CHLORIDE: 95 mmol/L — AB (ref 101–111)
CO2: 28 mmol/L (ref 22–32)
Calcium: 8.2 mg/dL — ABNORMAL LOW (ref 8.9–10.3)
Creatinine, Ser: 0.9 mg/dL (ref 0.44–1.00)
GFR calc non Af Amer: >60 mL/min (ref >60)
Glucose, Bld: 177 mg/dL — ABNORMAL HIGH (ref 65–99)
POTASSIUM: 3.5 mmol/L (ref 3.5–5.1)
SODIUM: 131 mmol/L — AB (ref 135–145)

## 2015-11-29 MED ORDER — METOPROLOL TARTRATE 25 MG PO TABS
25.0000 mg | ORAL_TABLET | Freq: Two times a day (BID) | ORAL | Status: DC
Start: 2015-11-29 — End: 2015-11-30
  Administered 2015-11-29: 25 mg via ORAL
  Filled 2015-11-29: qty 1

## 2015-11-29 MED ORDER — MUPIROCIN 2 % EX OINT: 1.0000 "application " | TOPICAL_OINTMENT | Freq: Two times a day (BID) | CUTANEOUS | Status: DC

## 2015-11-29 MED ORDER — METOPROLOL TARTRATE 5 MG/5ML IV SOLN
5.0000 mg | Freq: Once | INTRAVENOUS | Status: AC
  Administered 2015-11-29: 5 mg via INTRAVENOUS
  Filled 2015-11-29: qty 5

## 2015-11-29 MED ORDER — CHLORHEXIDINE GLUCONATE CLOTH 2 % EX PADS: 6.0000 | MEDICATED_PAD | Freq: Every day | CUTANEOUS | Status: DC

## 2015-11-29 MED ORDER — CIPROFLOXACIN HCL 500 MG PO TABS
500.0000 mg | ORAL_TABLET | Freq: Two times a day (BID) | ORAL | Status: DC
  Administered 2015-11-30 – 2015-12-03 (×7): 500 mg via ORAL
  Filled 2015-11-29 (×7): qty 1

## 2015-11-29 MED ORDER — SENNA 8.6 MG PO TABS
2.0000 | ORAL_TABLET | Freq: Every day | ORAL | Status: DC
  Administered 2015-11-29 – 2015-12-02 (×4): 17.2 mg via ORAL
  Filled 2015-11-29 (×4): qty 2

## 2015-11-29 NOTE — Progress Notes (Addendum)
PROGRESS NOTE    Tammy Beard  [id number] DOB: 05-02-1940 DOA: 11/26/2015 PCP: Jeanmarie Hubert, MD   Brief Narrative: Tammy Beard is a 75 year old female with history of easy bruising, GERD, osteopenia, polio, polymyalgia rheumatica, rheumatic fever, CVA, ulcers colitis. She presented with altered mental status. Symptoms likely secondary to metabolic encephalopathy from urinary tract infection. She has continued to improve with antibiotic therapy.   Assessment & Plan:   Active Problems:   Dyslipidemia   Essential hypertension   Ulcerative colitis ([place])   Polymyalgia rheumatica (HCC)   Sepsis secondary to UTI (HCC)   GERD (gastroesophageal reflux disease)   Hyponatremia   Sepsis ([place])   Sepsis due to Gram negative bacteria (HCC)   Citrobacter infection   Hypokalemia   Chronic combined systolic and diastolic CHF (congestive heart failure) ([place])   Sepsis secondary to urinary tract infection Pyelonephritis Patient appears to be improving well. She has read afebrile for last 24 hours. Urine culture positive for Citrobacter. Sensitivities available. -discontinue cefepime -start ciprofloxacin 500mg  BID -Continue IV fluids -Blood cultures pending, no growth 2 days  Hyponatremia Improved -Continue normal saline IVF  Hypokalemia Hypomagnesemia Potassium slightly low. A medium has been repleted and his slightly above upper limits of normal. Magnesium goal greater than 2 -Repeat BMP in the morning -Repeat magnesium in the morning -Continue to replete potassium  Tachycardia Patient has a diagnosis of atrial tachycardia and has been seen by cardiology. She has a history of an event monitor. On EKGs while inpatient, shown to have sinus tachy with PACs. Repeat shows sinus rhythm. Metoprolol appears to be controlling rate at the moment. Patient is currently asymptomatic -cardiology consult  AKI Appears resolved possibly prerenal azotemia secondary to hydration  Chronic  diastolic CHF Asymptomatic. She is tolerating fluids. -Monitor fluid status  Elevated troponin Likely secondary to demand. Patient is asymptomatic. Troponin has remained stable  GERD -Continue Pepcid  Hypertension Blood pressures are slightly above normotensive. -Continue metoprolol -Continue home Lotensin/hctz  Polymyalgia rheumatica -Continue prednisone 5 mg  Ulcerative colitis -Continue mesalamine 1.2 g daily  History of CVA -Continue atorvastatin  DVT prophylaxis: Heparin subcutaneous Code Status: DO NOT RESUSCITATE  Family Communication: No family at bedside. Disposition Plan: Likely discharge tomorrow to SNF or ALF pending physical therapy eval   Consultants:  Pharmacy   Procedures:  Echo pending  Antimicrobials:  Cefepime (9/4>>9/7)  Ciprofloxacin (9/8>>   Subjective: Patient states that she is urinating frequently but is improved. No chest pain, palpitations or dyspnea.  Objective: Vitals:   11/28/15 2314 11/29/15 0005 11/29/15 0443 11/29/15 1015  BP: (!) 184/58 137/65 (!) 166/86 (!) 164/70  Pulse:   (!) 48 96  Resp:   18   Temp:   97.8 F (36.6 C)   TempSrc:   Oral   SpO2:   98%   Weight:   50.1 kg (110 lb 7.2 oz)   Height:        Intake/Output Summary (Last 24 hours) at 11/29/15 1259 Last data filed at 11/29/15 0933  Gross per 24 hour  Intake              360 ml  Output             2300 ml  Net            -1940 ml   Filed Weights   11/27/15 1400 11/28/15 0602 11/29/15 0443  Weight: 52.1 kg (114 lb 14.4 oz) 51.8 kg (114 lb 3.2 oz) 50.1 kg (  110 lb 7.2 oz)    Examination:  General exam: Appears calm and comfortable Respiratory system: Clear to auscultation. Respiratory effort normal. Cardiovascular system: S1 & S2 heard, RRR. No murmurs, rubs, gallops or clicks Gastrointestinal system: Abdomen is nondistended, soft and nontender. No organomegaly or masses felt. Normal bowel sounds heard. Central nervous system: Alert and oriented  x3 Extremities: No cyanosis. No lower extremity edema.  Skin: No rashes, lesions or ulcers Psychiatry: Judgement and insight appear normal. Mood & affect appropriate.     Data Reviewed: I have personally reviewed following labs and imaging studies  CBC:  Recent Labs Lab 11/25/15 0512 11/26/15 1013 11/27/15 0243  WBC 9.9 13.3* 11.5*  NEUTROABS  --  11.3*  --   HGB 13.0 13.4 12.0  HCT 39.3 39.1 36.4  MCV 90.1 88.1 89.4  PLT 185 209 [id number]   Basic Metabolic Panel:  Recent Labs Lab 11/25/15 0512 11/26/15 1013 11/26/15 2300 11/27/15 0243 11/27/15 1435 11/28/15 0849  NA 126* 123* 127* 130*  --  131*  K 3.5 3.5 2.8* 2.4* 3.2* 3.3*  CL 87* 85* 95* 93*  --  93*  CO2 27 26 27 26   --  27  GLUCOSE 139* 173* 133* 117*  --  171*  BUN 17 24* 21* 18  --  14  CREATININE 1.03* 1.26* 1.08* 1.02*  --  0.93  CALCIUM 9.1 10.7* 8.6* 8.3*  --  8.4*  MG  --   --   --  1.6* 1.7 2.6*   GFR: Estimated Creatinine Clearance: 40.7 mL/min (by C-G formula based on SCr of 0.93 mg/dL). Liver Function Tests:  Recent Labs Lab 11/25/15 0512 11/26/15 1013 11/27/15 0243  AST 31 21 18   ALT 20 20 16   ALKPHOS 75 64 57  BILITOT 1.0 0.8 0.9  PROT 7.5 7.3 6.1*  ALBUMIN 4.1 4.0 3.4*   No results for input(s): LIPASE, AMYLASE in the last 168 hours. No results for input(s): AMMONIA in the last 168 hours. Coagulation Profile:  Recent Labs Lab 11/26/15 1640  INR 1.08   Cardiac Enzymes:  Recent Labs Lab 11/26/15 1013 11/26/15 1640 11/26/15 2300  TROPONINI 0.06* 0.07* 0.07*   BNP (last 3 results) No results for input(s): PROBNP in the last 8760 hours. HbA1C: No results for input(s): HGBA1C in the last 72 hours. CBG: No results for input(s): GLUCAP in the last 168 hours. Lipid Profile: No results for input(s): CHOL, HDL, LDLCALC, TRIG, CHOLHDL, LDLDIRECT in the last 72 hours. Thyroid Function Tests: No results for input(s): TSH, T4TOTAL, FREET4, T3FREE, THYROIDAB in the last 72  hours. Anemia Panel: No results for input(s): VITAMINB12, FOLATE, FERRITIN, TIBC, IRON, RETICCTPCT in the last 72 hours. Sepsis Labs:  Recent Labs Lab 11/26/15 1020 11/26/15 1640 11/26/15 1851  PROCALCITON  --  <0.10  --   LATICACIDVEN 1.46 0.9 0.9    Recent Results (from the past 240 hour(s))  Urine culture     Status: Abnormal   Collection Time: 11/25/15  5:35 AM  Result Value Ref Range Status   Specimen Description URINE, CATHETERIZED  Final   Special Requests NONE  Final   Culture >=100,000 COLONIES/mL CITROBACTER FREUNDII (A)  Final   Report Status 11/27/2015 FINAL  Final   Organism ID, Bacteria CITROBACTER FREUNDII (A)  Final      Susceptibility   Citrobacter freundii - MIC*    CEFAZOLIN >=64 RESISTANT Resistant     CEFTRIAXONE <=1 SENSITIVE Sensitive     CIPROFLOXACIN <=0.25 SENSITIVE Sensitive  GENTAMICIN <=1 SENSITIVE Sensitive     IMIPENEM 1 SENSITIVE Sensitive     NITROFURANTOIN <=16 SENSITIVE Sensitive     TRIMETH/SULFA <=20 SENSITIVE Sensitive     PIP/TAZO <=4 SENSITIVE Sensitive     * >=100,000 COLONIES/mL CITROBACTER FREUNDII  Blood Culture (routine x 2)     Status: None (Preliminary result)   Collection Time: 11/26/15 10:19 AM  Result Value Ref Range Status   Specimen Description BLOOD RIGHT ANTECUBITAL  Final   Special Requests BOTTLES DRAWN AEROBIC AND ANAEROBIC 5CC  Final   Culture NO GROWTH 2 DAYS  Final   Report Status PENDING  Incomplete  Urine culture     Status: None   Collection Time: 11/26/15 10:21 AM  Result Value Ref Range Status   Specimen Description URINE, CATHETERIZED  Final   Special Requests NONE  Final   Culture NO GROWTH  Final   Report Status 11/27/2015 FINAL  Final  Blood Culture (routine x 2)     Status: None (Preliminary result)   Collection Time: 11/26/15 10:45 AM  Result Value Ref Range Status   Specimen Description BLOOD RIGHT FOREARM  Final   Special Requests BOTTLES DRAWN AEROBIC AND ANAEROBIC 2.5CC  Final   Culture  NO GROWTH 2 DAYS  Final   Report Status PENDING  Incomplete  MRSA PCR Screening     Status: None   Collection Time: 11/26/15  2:40 PM  Result Value Ref Range Status   MRSA by PCR NEGATIVE NEGATIVE Final    Comment:        The GeneXpert MRSA Assay (FDA approved for NASAL specimens only), is one component of a comprehensive MRSA colonization surveillance program. It is not intended to diagnose MRSA infection nor to guide or monitor treatment for MRSA infections.          Radiology Studies: No results found.      Scheduled Meds: . atorvastatin  10 mg Oral q1800  . benazepril  20 mg Oral Daily   And  . hydrochlorothiazide  12.5 mg Oral Daily  . ceFEPime (MAXIPIME) IV  1 g Intravenous Q24H  . clopidogrel  75 mg Oral Daily  . famotidine  20 mg Oral Daily  . heparin  5,000 Units Subcutaneous Q8H  . mesalamine  1.2 g Oral Q breakfast  . metoprolol tartrate  12.5 mg Oral BID  . predniSONE  5 mg Oral Q breakfast  . sodium chloride flush  3 mL Intravenous Q12H   Continuous Infusions: . sodium chloride 100 mL/hr at 11/29/15 0115     LOS: 3 days     Cordelia Poche, MD [facility] 11/29/2015, 12:59 PM Pager: [phone]  If 7PM-7AM, please contact night-coverage [email or web address] Password [email or web address] 11/29/2015, 12:59 PM

## 2015-11-29 NOTE — Care Management Important Message (Signed)
Important Message  Patient Details  Name: Tammy Beard MRN: [id number] Date of Birth: Jun 12, 1940   Medicare Important Message Given:  Yes    [name] 11/29/2015, 10:06 AM

## 2015-11-29 NOTE — Evaluation (Signed)
Physical Therapy Evaluation Patient Details Name: Tammy Beard MRN: [id number] DOB: 1940/10/25 Today's Date: 11/29/2015   History of Present Illness  Tammy Beard is a 75 year old female with history of easy bruising, GERD, HIV, osteopenia, polio, polymyalgia rheumatica, rheumatic fever, CVA, ulcers colitis. She presented with altered mental status. Symptoms likely secondary to metabolic encephalopathy from urinary tract infection.  Clinical Impression  Patient presents with decreased independence with mobility due to deficits listed in PT problem list.  She will benefit from skilled PT in the acute setting to allow return to ALF following SNF level rehab stay.    Follow Up Recommendations SNF    Equipment Recommendations  None recommended by PT    Recommendations for Other Services       Precautions / Restrictions Precautions Precautions: Fall      Mobility  Bed Mobility Overal bed mobility: Needs Assistance Bed Mobility: Rolling;Sidelying to Sit;Sit to Supine Rolling: Mod assist Sidelying to sit: Max assist   Sit to supine: Mod assist   General bed mobility comments: assist for lifting trunk and moving legs off bed, to supine assist for legs into bed (was able to scoot back onto bed on her own)  Transfers Overall transfer level: Needs assistance Equipment used: None Transfers: Stand Pivot Transfers   Stand pivot transfers: Mod assist;Max assist       General transfer comment: bed<>BSC lifting assist to stand and assist for pivotal steps to [facility] and to bed and lowering assist  Ambulation/Gait             General Gait Details: deferred due to c/o nausea and feeling poorly sitting on [facility]  Stairs            Wheelchair Mobility    Modified Rankin (Stroke Patients Only)       Balance Overall balance assessment: Needs assistance   Sitting balance-Leahy Scale: Poor Sitting balance - Comments: initially unable to sit unsupported with leaning  posterior, then after up to [facility] and back to sit EOB, sat with UE support and supervision Postural control: Posterior lean                                   Pertinent Vitals/Pain Pain Assessment: Faces Faces Pain Scale: Hurts little more Pain Location: achey all over Pain Descriptors / Indicators: Aching Pain Intervention(s): Monitored during session;Repositioned;Limited activity within patient's tolerance    Home Living Family/patient expects to be discharged to:: Assisted living                 Additional Comments: states has been in rehab at [facility], but not sure if came from there or ALF    Prior Function Level of Independence: Needs assistance   Gait / Transfers Assistance Needed: reports normally doesn't use a walker, doesn't recall recent history           Hand Dominance        Extremity/Trunk Assessment   Upper Extremity Assessment: Generalized weakness           Lower Extremity Assessment: Generalized weakness      Cervical / Trunk Assessment: Kyphotic  Communication   Communication: HOH (has hearing aides)  Cognition Arousal/Alertness: Awake/alert Behavior During Therapy: WFL for tasks assessed/performed Overall Cognitive Status: No family/caregiver present to determine baseline cognitive functioning (admits she is here due to being confused)       Memory: Decreased short-term memory  General Comments General comments (skin integrity, edema, etc.): Patient incontinent of urine in bed, assisted to change linens and pt's gown while up on [facility]    Exercises        Assessment/Plan    PT Assessment Patient needs continued PT services  PT Diagnosis Generalized weakness;Difficulty walking   PT Problem List Decreased strength;Decreased activity tolerance;Decreased balance;Pain;Decreased cognition;Decreased mobility;Decreased safety awareness  PT Treatment Interventions DME instruction;Gait  training;Functional mobility training;Therapeutic exercise;Balance training;Therapeutic activities;Patient/family education;Cognitive remediation   PT Goals (Current goals can be found in the Care Plan section) Acute Rehab PT Goals Patient Stated Goal: None stated PT Goal Formulation: Patient unable to participate in goal setting Time For Goal Achievement: 12/13/15 Potential to Achieve Goals: Fair    Frequency Min 2X/week   Barriers to discharge        Co-evaluation               End of Session Equipment Utilized During Treatment: Gait belt Activity Tolerance: Patient limited by fatigue Patient left: in bed;with call bell/phone within reach;with bed alarm set Nurse Communication: Mobility status         Time: [id number] PT Time Calculation (min) (ACUTE ONLY): 19 min   Charges:   PT Evaluation $PT Eval Moderate Complexity: 1 Procedure     PT G CodesReginia Naas 26-Dec-2015, 5:14 PM  Magda Kiel, [place] 2015-12-26

## 2015-11-29 NOTE — Consult Note (Signed)
   [facility] CM Inpatient Consult   11/29/2015  Tammy Beard Oct 28, 1940 [id number]  Patient screened for potential [facility] Management services for 2 admissions in the past 6 month. Patient is eligible for [facility] Care Management services under patient's Medicare  Plan.  Per MD notes -  Patient is a 75 year old female with history of easy bruising, GERD, HIV, osteopenia, polio, polymyalgia rheumatica, rheumatic fever, CVA, ulcers colitis. HX of HF. She presented with altered mental status   Patient states she resides at [facility] where she receives nursig and therapy services she states, she states she thinks it's an assisted living and then she states, "I don't know, where they are going to put me."  Then she states, "I was just joking."   Patient was given a brochure with contact information.  For questions contact:   Natividad Brood, RN BSN [facility] Liaison  [phone] business mobile phone Toll free office [phone]

## 2015-11-29 NOTE — Consult Note (Signed)
ELECTROPHYSIOLOGY CONSULT NOTE    Patient ID: Tammy Beard MRN: [id number], DOB/AGE: Jan 29, 1941 75 y.o.  Admit date: 11/26/2015 Date of Consult: 11/29/2015  Primary Physician: Jeanmarie Hubert, MD Primary Cardiologist: Johnsie Cancel Electrophysiologist: Curt Bears  Reason for Consultation: atrial tachycardia  HPI:  Tammy Beard is a 75 y.o. female with a past medical history significant for hypertension, hyperlipidemia, polio, prior CVA, and atrial tachycardia. She was admitted with altered mental status felt to be metabolic encephalopathy 2/2 UTI. She has improved with antibiotics. On telemetry, she has been noted to have atrial tachycardia with rates into the 180's with exertion. EP has been asked to evaluate for treatment options.  Echo 11/28/15 demonstrated EF 60-65%, no RWMA, LA 29.  She currently is in SR but states that she doesn't feel well. She denies palpitations, shortness of breath or chest pain. She is unable to articulate why she doesn't feel well.  She has not had dizziness, pre-syncope, or syncope.   Past Medical History:  Diagnosis Date  . Colitis   . Ecchymosis 11/15/2015  . GERD (gastroesophageal reflux disease)   . Heart murmur   . Hyperlipidemia   . Hypertension   . Osteopenia    s/p 7 yrs Fosamax  . Polio   . Polymyalgia ([place])   . Rheumatic fever   . Sleep disorder   . Stroke ([place])   . Ulcerative colitis      Surgical History:  Past Surgical History:  Procedure Laterality Date  . BREAST LUMPECTOMY  1986   left; benign  . CATARACT EXTRACTION     Left eye   . COLONOSCOPY  2006  . Colonosopy  07/05/13   Dr. Deatra Ina  . DILATION AND CURETTAGE OF UTERUS  12/1999  . MELANOMA EXCISION  2007   left arm  . NECK SURGERY  1986   Benign growth on neck removed  . TONSILLECTOMY       Prescriptions Prior to Admission  Medication Sig Dispense Refill Last Dose  . acetaminophen (TYLENOL) 325 MG tablet Take 650 mg by mouth every 6 (six) hours as needed.   Past Week at  Unknown time  . ARTIFICIAL TEAR SOLUTION OP Apply 1 drop to eye daily as needed.   unkn  . atorvastatin (LIPITOR) 10 MG tablet Take 1 tablet (10 mg total) by mouth daily at 6 PM.   11/25/2015 at Unknown time  . benazepril-hydrochlorthiazide (LOTENSIN HCT) 20-12.5 MG tablet Take 0.5 tablets by mouth daily.   11/25/2015 at Unknown time  . Calcium Carbonate-Vit D-Min (CALCIUM 600+D PLUS MINERALS) 600-400 MG-UNIT TABS Take 2 tablets by mouth 2 (two) times daily.    11/25/2015 at Unknown time  . clopidogrel (PLAVIX) 75 MG tablet Take 1 tablet (75 mg total) by mouth daily.   11/25/2015 at Unknown time  . dimenhyDRINATE (DRAMAMINE) 50 MG tablet Take 1 tablet every 6 hours as needed for nausea or dizziness 24 tablet 0 Past Week at Unknown time  . mesalamine (LIALDA) 1.2 g EC tablet take 2 tablets by mouth daily with BREAKFAST 60 tablet 6 11/25/2015 at Unknown time  . metoprolol tartrate (LOPRESSOR) 25 MG tablet Take 1 tablet (25 mg total) by mouth 2 (two) times daily.   11/25/2015 at 1800  . Multiple Vitamin (MULTIVITAMIN) tablet Take 1 tablet by mouth daily.     11/25/2015 at Unknown time  . predniSONE (DELTASONE) 5 MG tablet TWO TABLETs DAILY TO TREAT POLYMYALGIA RHEUMATICA 180 tablet 2 11/25/2015 at Unknown time  . ranitidine (ZANTAC) 150  MG tablet Take 150 mg by mouth at bedtime. Take one tablet at bedtime    11/25/2015 at Unknown time  . sennosides-docusate sodium (SENOKOT-S) 8.6-50 MG tablet Take 1 tablet by mouth 3 (three) times daily. Take one tablet three times daily for constipation-hold for loose stools    11/25/2015 at Unknown time  . [DISCONTINUED] levofloxacin (LEVAQUIN) 750 MG tablet Take 1 tablet (750 mg total) by mouth daily. X 7 days 6 tablet 0 11/26/2015 at Unknown time  . fluticasone (FLONASE) 50 MCG/ACT nasal spray Place 1 spray into both nostrils daily as needed for allergies or rhinitis. Reported on 09/28/2015   unkn    Inpatient Medications:  . atorvastatin  10 mg Oral q1800  . benazepril  20 mg Oral  Daily   And  . hydrochlorothiazide  12.5 mg Oral Daily  . ceFEPime (MAXIPIME) IV  1 g Intravenous Q24H  . clopidogrel  75 mg Oral Daily  . famotidine  20 mg Oral Daily  . heparin  5,000 Units Subcutaneous Q8H  . mesalamine  1.2 g Oral Q breakfast  . metoprolol tartrate  12.5 mg Oral BID  . predniSONE  5 mg Oral Q breakfast  . sodium chloride flush  3 mL Intravenous Q12H    Allergies: No Known Allergies  Social History   Social History  . Marital status: Single    Spouse name: N/A  . Number of children: N/A  . Years of education: N/A   Occupational History  . retired    Social History Main Topics  . Smoking status: Never Smoker  . Smokeless tobacco: Never Used  . Alcohol use Yes     Comment: occasional  . Drug use: No  . Sexual activity: No   Other Topics Concern  . Not on file   Social History Narrative   Daily Caffeine use- soda (diet)      Do you drink/ eat things with caffeine? Yes, sometimes      Marital status:    Never                            Lives at [facility], transfer to skill unit 09/26/15, transfer to AL 11/09/15      Do you have any pets in your home ?(please list) No      Current or past profession: Geophysicist/field seismologist for Social worker      Do you exercise?    No                             Living Will, POA, DNR, MOST     Family History  Problem Relation Age of Onset  . Pancreatic cancer Other   . Other Mother     Coad/ Bronchiectosis  . Diabetes Paternal Grandmother   . Heart failure Paternal Grandmother   . Cancer Paternal Aunt     unaware of primary site  . Coronary artery disease Neg Hx   . Colon cancer Neg Hx   . Rectal cancer Neg Hx   . Stomach cancer Neg Hx   . Heart attack Neg Hx      Review of Systems: All other systems reviewed and are otherwise negative except as noted above.  Physical Exam: Vitals:   11/28/15 2314 11/29/15 0005 11/29/15 0443 11/29/15 1015  BP: (!) 184/58 137/65 (!) 166/86 (!) 164/70  Pulse:   (!) 48 96  Resp:   18   Temp:   97.8 F (36.6 C)   TempSrc:   Oral   SpO2:   98%   Weight:   110 lb 7.2 oz (50.1 kg)   Height:        GEN- The patient is elderly and chronically ill appearing, alert and oriented x 3 today.   HEENT: normocephalic, atraumatic; sclera clear, conjunctiva pink; hearing intact; oropharynx clear; neck supple  Lungs- Clear to ausculation bilaterally, normal work of breathing  Heart- Regular rate and rhythm, 3/6SEM GI- soft, non-tender, non-distended, bowel sounds present  Extremities- no clubbing, cyanosis, or edema  MS- no significant deformity or atrophy Skin- warm and dry, no rash or lesion Psych- euthymic mood, full affect Neuro- strength and sensation are intact  Labs:   Lab Results  Component Value Date   WBC 11.5 (H) 11/27/2015   HGB 12.0 11/27/2015   HCT 36.4 11/27/2015   MCV 89.4 11/27/2015   PLT 163 11/27/2015    Recent Labs Lab 11/27/15 0243  11/29/15 1149  NA 130*  < > 131*  K 2.4*  < > 3.5  CL 93*  < > 95*  CO2 26  < > 28  BUN 18  < > 13  CREATININE 1.02*  < > 0.90  CALCIUM 8.3*  < > 8.2*  PROT 6.1*  --   --   BILITOT 0.9  --   --   ALKPHOS 57  --   --   ALT 16  --   --   AST 18  --   --   GLUCOSE 117*  < > 177*  < > = values in this interval not displayed.    Radiology/Studies: Dg Chest 2 View Result Date: 11/25/2015 CLINICAL DATA:  Acute onset of generalized weakness. Initial encounter. EXAM: CHEST  2 VIEW COMPARISON:  Chest radiograph performed 09/21/2015 FINDINGS: The lungs are hyperexpanded, with flattening of the hemidiaphragms, compatible with COPD. Mild left basilar atelectasis is noted. There is no evidence of pleural effusion or pneumothorax. The heart is borderline enlarged. No acute osseous abnormalities are seen. Postoperative change is noted overlying the mediastinum. IMPRESSION: Mild left basilar atelectasis noted. Borderline cardiomegaly. Findings of COPD. Electronically Signed   By: Garald Balding M.D.   On: 11/25/2015  06:12   Ct Head Wo Contrast Result Date: 11/26/2015 CLINICAL DATA:  75 year old female with altered mental status. EXAM: CT HEAD WITHOUT CONTRAST TECHNIQUE: Contiguous axial images were obtained from the base of the skull through the vertex without intravenous contrast. COMPARISON:  11/25/2015 and prior CTs dating back to 09/21/2015 FINDINGS: Brain: No evidence of acute infarction, hemorrhage, hydrocephalus, extra-axial collection or mass lesion/mass effect. Atrophy and chronic small-vessel white matter ischemic changes again identified. Vascular: Intracranial atherosclerotic calcifications noted. Skull: Unremarkable Sinuses/Orbits: Visualized portions unremarkable. Other: None IMPRESSION: No evidence of acute intracranial abnormality. Atrophy and chronic small-vessel white matter ischemic changes. Electronically Signed   By: Margarette Canada M.D.   On: 11/26/2015 11:25    [id number] rhythm, PAC's, rate 80  TELEMETRY: sinus rhythm, atrial tachycardia with RVR   Assessment/Plan: 1.  Atrial tachycardia The patient has atrial tachycardia with RVR with exertion in the setting of UTI.   Would up-titrate BB as able for rate control - will increase to Metoprolol 25mg  bid today   No atrial fibrillation identified She has had prior ischemic stroke, continue 30 day monitor at discharge, consider ILR implant if no AF identified  2.  UTI/pyelonephritis Management per primary  team  3.  Chronic diastolic heart failure Euvolemic Continue current therapy  4.  Elevated troponin Trend flat Likely 2/2 infection   Dr Curt Bears to see later today  Signed, Chanetta Marshall, NP 11/29/2015 2:28 PM      I have seen and examined this patient with Chanetta Marshall.  Agree with above, note added to reflect my findings.  On exam, regular rhythm, no murmurs, lungs clear. Presented to the hospital with altered mental status and found to have pyelonephritis.  Has been in and out of atrial tachycardia as well. Agree with plan to  up titrate beta blockers for rate control. Agree with further monitoring as above.    Will M. Camnitz MD 11/29/2015 3:43 PM

## 2015-11-29 NOTE — Progress Notes (Signed)
Pharmacy Antibiotic Note  Tammy Beard is a 75 y.o. female admitted on 11/26/2015 with citrobacter freundii UTI.  Pharmacy has been consulted for cefepime dosing - day #4. Afebrile, wbc down 11.5, CrCl~40.  Plan: - Cefepime 1gm IV Q24H - may switch to PO soon per MD note - F/u renal fxn, C&S, clinical status  Height: 5\' 4"  (162.6 cm) Weight: 110 lb 7.2 oz (50.1 kg) (scale b) IBW/kg (Calculated) : 54.7  Temp (24hrs), Avg:98.5 F (36.9 C), Min:97.8 F (36.6 C), Max:99.3 F (37.4 C)   Recent Labs Lab 11/25/15 0512 11/26/15 1013 11/26/15 1020 11/26/15 1640 11/26/15 1851 11/26/15 2300 11/27/15 0243 11/28/15 0849  WBC 9.9 13.3*  --   --   --   --  11.5*  --   CREATININE 1.03* 1.26*  --   --   --  1.08* 1.02* 0.93  LATICACIDVEN  --   --  1.46 0.9 0.9  --   --   --     Estimated Creatinine Clearance: 40.7 mL/min (by C-G formula based on SCr of 0.93 mg/dL).    No Known Allergies  Antimicrobials this admission: Cefepime 9/4>>  Dose adjustments this admission: N/A  Microbiology results: 9/3 Urine - citrobacter freundii (R-cefazolin) 9/4 UC - neg 9/4 BCx2: ngtd  Elicia Lamp, PharmD, BCPS Clinical Pharmacist 11/29/2015 10:04 AM

## 2015-11-29 NOTE — Progress Notes (Signed)
Pt BP 178/110. HR in the 170s with exertion. Rechecked BP manually 164/70. HR now in 115-120 range. Paged MD to make aware. Will continue to monitor.

## 2015-11-29 NOTE — Clinical Social Work Note (Signed)
Clinical Social Work Assessment  Patient Details  Name: Tammy Beard MRN: [id number] Date of Birth: 05/22/1940  Date of referral:  11/29/15               Reason for consult:  Discharge Planning                Permission sought to share information with:  Facility Sport and exercise psychologist, Family Supports Permission granted to share information::  Yes, Verbal Permission Granted  Name::     Juliann Pulse  Agency::  [facility]  Relationship::  Sister  Contact Information:  [phone]  Housing/Transportation Living arrangements for the past 2 months:  [place] of Information:  Patient, Medical Team, Other (Comment Required) (Sister) Patient Interpreter Needed:  None Criminal Activity/Legal Involvement Pertinent to Current Situation/Hospitalization:  No - Comment as needed Significant Relationships:  Siblings Lives with:  Facility Resident Do you feel safe going back to the place where you live?  Yes Need for family participation in patient care:  Yes (Comment)  Care giving concerns:  Patient from [facility] ALF. Awaiting PT eval.   Social Worker assessment / plan:  CSW met with patient. Sister at bedside. CSW introduced role and explained that discharge planning would be discussed. Patient and her sister confirmed that she is from [facility] and plans to return, whether ALF or SNF, depending on PT eval. Patient's sister will transport. No further concerns. CSW encouraged patient and her sister to contact CSW as needed. CSW will continue to follow patient and her sister for support and facilitate discharge back to [facility] once stable.  Employment status:  Retired Forensic scientist:  Medicare PT Recommendations:  Not assessed at this time Information / Referral to community resources:  [place]  Patient/Family's Response to care:  Patient and her sister agreeable to return to [facility] whether for ALF or SNF.  Patient's siblings supportive and involved in patient's care. Patient and her sister appreciated social work intervention.  Patient/Family's Understanding of and Emotional Response to Diagnosis, Current Treatment, and Prognosis:  Patient understand possible need for rehab before resuming ALF services. Patient and her sister appear happy with hospital care.  Emotional Assessment Appearance:  Appears stated age Attitude/Demeanor/Rapport:  Other (Pleasant) Affect (typically observed):  Accepting, Appropriate, Calm, Pleasant Orientation:  Oriented to Self, Oriented to Place Alcohol / Substance use:  Never Used Psych involvement (Current and /or in the community):  No (Comment)  Discharge Needs  Concerns to be addressed:  Care Coordination Readmission within the last 30 days:  No Current discharge risk:  Cognitively Impaired, Dependent with Mobility Barriers to Discharge:  No Barriers Identified   Candie Chroman, LCSW 11/29/2015, 1:38 PM

## 2015-11-29 NOTE — Progress Notes (Signed)
Patient's BP rechecked after giving PO dose of Metoprolol. BP 184/58. Patient has no complaints at this time. IV Hydralazine given. Will recheck BP for result of PRN medication.

## 2015-11-30 DIAGNOSIS — I471 Supraventricular tachycardia: Secondary | ICD-10-CM

## 2015-11-30 LAB — BASIC METABOLIC PANEL
Anion gap: 9 (ref 5–15)
BUN: 14 mg/dL (ref 6–20)
CHLORIDE: 95 mmol/L — AB (ref 101–111)
CO2: 23 mmol/L (ref 22–32)
CREATININE: 0.97 mg/dL (ref 0.44–1.00)
Calcium: 8.1 mg/dL — ABNORMAL LOW (ref 8.9–10.3)
GFR calc Af Amer: >60 mL/min (ref >60)
GFR calc non Af Amer: 55 mL/min — ABNORMAL LOW (ref >60)
GLUCOSE: 229 mg/dL — AB (ref 65–99)
Potassium: 3.3 mmol/L — ABNORMAL LOW (ref 3.5–5.1)
Sodium: 127 mmol/L — ABNORMAL LOW (ref 135–145)

## 2015-11-30 LAB — MAGNESIUM: MAGNESIUM: 1.8 mg/dL (ref 1.7–2.4)

## 2015-11-30 MED ORDER — DILTIAZEM HCL-DEXTROSE 100-5 MG/100ML-% IV SOLN (PREMIX)
5.0000 mg/h | INTRAVENOUS | Status: DC
  Administered 2015-11-30 – 2015-12-02 (×3): 5 mg/h via INTRAVENOUS
  Filled 2015-11-30 (×3): qty 100

## 2015-11-30 MED ORDER — DILTIAZEM LOAD VIA INFUSION
10.0000 mg | Freq: Once | INTRAVENOUS | Status: AC
  Administered 2015-11-30: 10 mg via INTRAVENOUS
  Filled 2015-11-30: qty 10

## 2015-11-30 MED ORDER — PREDNISONE 10 MG PO TABS
10.0000 mg | ORAL_TABLET | Freq: Every day | ORAL | Status: DC
  Administered 2015-11-30 – 2015-12-03 (×4): 10 mg via ORAL
  Filled 2015-11-30 (×4): qty 1

## 2015-11-30 MED ORDER — MESALAMINE 1.2 G PO TBEC
1.2000 g | DELAYED_RELEASE_TABLET | Freq: Every day | ORAL | Status: DC
  Filled 2015-11-30: qty 1

## 2015-11-30 MED ORDER — SODIUM CHLORIDE 0.9 % IV BOLUS (SEPSIS)
500.0000 mL | Freq: Once | INTRAVENOUS | Status: AC
  Administered 2015-11-30: 500 mL via INTRAVENOUS

## 2015-11-30 MED ORDER — METOPROLOL TARTRATE 5 MG/5ML IV SOLN
INTRAVENOUS | Status: AC
  Filled 2015-11-30: qty 5

## 2015-11-30 MED ORDER — METOPROLOL TARTRATE 5 MG/5ML IV SOLN
5.0000 mg | Freq: Once | INTRAVENOUS | Status: AC
  Administered 2015-11-30: 5 mg via INTRAVENOUS

## 2015-11-30 MED ORDER — METOPROLOL TARTRATE 50 MG PO TABS
50.0000 mg | ORAL_TABLET | Freq: Two times a day (BID) | ORAL | Status: DC
  Administered 2015-11-30 – 2015-12-03 (×7): 50 mg via ORAL
  Filled 2015-11-30 (×7): qty 1

## 2015-11-30 MED ORDER — DILTIAZEM HCL-DEXTROSE 100-5 MG/100ML-% IV SOLN (PREMIX)
5.0000 mg/h | INTRAVENOUS | Status: DC
  Administered 2015-11-30: 5 mg/h via INTRAVENOUS
  Administered 2015-11-30: 7.5 mg/h via INTRAVENOUS
  Filled 2015-11-30: qty 100

## 2015-11-30 MED ORDER — MESALAMINE 1.2 G PO TBEC
2.4000 g | DELAYED_RELEASE_TABLET | Freq: Every day | ORAL | Status: DC
  Administered 2015-12-01 – 2015-12-03 (×3): 2.4 g via ORAL
  Filled 2015-11-30 (×4): qty 2

## 2015-11-30 MED ORDER — DILTIAZEM HCL 30 MG PO TABS
30.0000 mg | ORAL_TABLET | Freq: Four times a day (QID) | ORAL | Status: DC
  Administered 2015-11-30 (×2): 30 mg via ORAL
  Filled 2015-11-30 (×2): qty 1

## 2015-11-30 NOTE — NC FL2 (Signed)
[place] LEVEL OF CARE SCREENING TOOL     IDENTIFICATION  Patient Name: Tammy Beard Birthdate: 1941-01-25 Sex: female Admission Date (Current Location): 11/26/2015  [facility] and [place] Number:  Herbalist and Address:  [facility] [place], [place] [address], [place], [place] [address]      Provider Number: [id number]  Attending Physician Name and Address:  Mariel Aloe, MD  Relative Name and Phone Number:       Current Level of Care: Hospital Recommended Level of Care: [place] Prior Approval Number:    Date Approved/Denied:   PASRR Number: [id number]  Discharge Plan: SNF    Current Diagnoses: Patient Active Problem List   Diagnosis Date Noted  . Citrobacter infection   . Hypokalemia   . Chronic combined systolic and diastolic CHF (congestive heart failure) ([place])   . Sepsis ([place]) 11/26/2015  . Sepsis due to Gram negative bacteria ([place])   . GERD (gastroesophageal reflux disease) 11/22/2015  . Malaise 11/22/2015  . Hyponatremia 11/22/2015  . Ecchymosis 11/15/2015  . Palpitations 11/02/2015  . Atrial tachycardia ([place]) 09/25/2015  . Sepsis secondary to UTI ([place]) 09/22/2015  . Acute kidney injury ([place]) 09/21/2015  . Cerebrovascular accident (CVA) ([place])   . Disequilibrium   . Dizzy 09/18/2015  . 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 Diastasis recti 01/23/2015  . Pre-diabetes 03/07/2014  . Heart murmur 03/07/2014  . Polymyalgia rheumatica ([place]) 12/08/2013  . Disturbance in sleep behavior 06/28/2009  . Urinary frequency 06/28/2009  . Dyslipidemia 11/09/2008  . Essential hypertension 06/18/2007  . ALLERGIC RHINITIS 06/18/2007  . Ulcerative colitis ([place]) 06/08/2007  . OSTEOPENIA 06/08/2007  . MURMUR 06/08/2007    Orientation RESPIRATION BLADDER Height & Weight     Self, Place  Normal Incontinent Weight: 110 lb 10.7 oz (50.2 kg) (bedscale due to hr elevated) Height:  5\' 4"  (162.6 cm)  BEHAVIORAL  SYMPTOMS/MOOD NEUROLOGICAL BOWEL NUTRITION STATUS   (None)  (None) Continent Diet (Heart healthy)  AMBULATORY STATUS COMMUNICATION OF NEEDS Skin   Limited Assist Verbally Bruising                       Personal Care Assistance Level of Assistance  Bathing, Feeding, Dressing Bathing Assistance: Limited assistance Feeding assistance: Independent Dressing Assistance: Limited assistance     Functional Limitations Info  Sight, Hearing, Speech Sight Info: Adequate Hearing Info: Adequate Speech Info: Adequate    SPECIAL CARE FACTORS FREQUENCY  PT (By licensed PT), Blood pressure     PT Frequency: 5 x week              Contractures Contractures Info: Not present    Additional Factors Info  Code Status, Allergies Code Status Info: DNR Allergies Info: NKDA           Current Medications (11/30/2015):  This is the current hospital active medication list Current Facility-Administered Medications  Medication Dose Route Frequency Provider Last Rate Last Dose  . 0.9 %  sodium chloride infusion   Intravenous Continuous Waldemar Dickens, MD 100 mL/hr at 11/30/15 0300    . acetaminophen (TYLENOL) tablet 650 mg  650 mg Oral Q6H PRN Waldemar Dickens, MD       Or  . acetaminophen (TYLENOL) suppository 650 mg  650 mg Rectal Q6H PRN Waldemar Dickens, MD      . atorvastatin (LIPITOR) tablet 10 mg  10 mg Oral q1800 Waldemar Dickens, MD   10  mg at 11/29/15 1825  . benazepril (LOTENSIN) tablet 20 mg  20 mg Oral Daily Mariel Aloe, MD   20 mg at 11/30/15 0950   And  . hydrochlorothiazide (MICROZIDE) capsule 12.5 mg  12.5 mg Oral Daily Mariel Aloe, MD   12.5 mg at 11/30/15 0949  . ciprofloxacin (CIPRO) tablet 500 mg  500 mg Oral BID Mariel Aloe, MD   500 mg at 11/30/15 0858  . clopidogrel (PLAVIX) tablet 75 mg  75 mg Oral Daily Waldemar Dickens, MD   75 mg at 11/30/15 0949  . diltiazem (CARDIZEM) 100 mg in dextrose 5% 165mL (1 mg/mL) infusion  5-15 mg/hr Intravenous Continuous Rhetta Mura Schorr, NP 7.5 mL/hr at 11/30/15 0746 7.5 mg/hr at 11/30/15 0746  . dimenhyDRINATE (DRAMAMINE) tablet 50 mg  50 mg Oral Q6H PRN Waldemar Dickens, MD      . famotidine (PEPCID) tablet 20 mg  20 mg Oral Daily Waldemar Dickens, MD   20 mg at 11/30/15 0949  . heparin injection 5,000 Units  5,000 Units Subcutaneous Q8H Waldemar Dickens, MD   5,000 Units at 11/30/15 [id number]  . hydrALAZINE (APRESOLINE) injection 5-10 mg  5-10 mg Intravenous Q4H PRN Waldemar Dickens, MD   5 mg at 11/28/15 2319  . mesalamine (LIALDA) EC tablet 2.4 g  2.4 g Oral Q breakfast Mariel Aloe, MD      . metoprolol (LOPRESSOR) tablet 50 mg  50 mg Oral BID Patsey Berthold, NP   50 mg at 11/30/15 0858  . oxyCODONE (Oxy IR/ROXICODONE) immediate release tablet 5 mg  5 mg Oral Q4H PRN Waldemar Dickens, MD      . predniSONE (DELTASONE) tablet 10 mg  10 mg Oral Q breakfast Mariel Aloe, MD   10 mg at 11/30/15 0858  . senna (SENOKOT) tablet 17.2 mg  2 tablet Oral QHS Mariel Aloe, MD   17.2 mg at 11/29/15 2041  . sodium chloride flush (NS) 0.9 % injection 3 mL  3 mL Intravenous Q12H Waldemar Dickens, MD   3 mL at 11/29/15 2102     Discharge Medications: Please see discharge summary for a list of discharge medications.  Relevant Imaging Results:  Relevant Lab Results:   Additional Information SS#: [id number]  Candie Chroman, LCSW

## 2015-11-30 NOTE — Progress Notes (Signed)
Notified by nurse that patient is tachycardic again - has been in/out of atrial tach this admission. This evening HR trending up to 160s-170s at times, c/w prior rhythm - appears multifocal atrial tach. Being followed by EP. Asymptomatic, laying in bed. Temp stable. Reviewed with Dr. Radford Pax, will d/c oral dilt and load with 10mg  IV diltiazem and start drip at 5mg /hr. Follow BP closely as this has been borderline - it appears that it has actually improved during times of HR control so it hopefully will come up with better rate control. Continue oral metoprolol as tolerated. Will also send for stat lytes as she has had significant hypokalemia and hypomagnesemia earlier in admission, have written care order for nurse to page our team back when they result to review for need for additional supplementation. Pulse ox WNL.  Tammy Gibeault PA-C

## 2015-11-30 NOTE — Progress Notes (Addendum)
PROGRESS NOTE    Tammy Beard  [id number] DOB: 03-Sep-1940 DOA: 11/26/2015 PCP: Jeanmarie Hubert, MD   Brief Narrative: Tammy Beard is a 75 year old female with history of easy bruising, GERD, osteopenia, polio, polymyalgia rheumatica, rheumatic fever, CVA, ulcers colitis. She presented with altered mental status. Symptoms likely secondary to metabolic encephalopathy from urinary tract infection. She has continued to improve with antibiotic therapy.   Assessment & Plan:   Active Problems:   Dyslipidemia   Essential hypertension   Ulcerative colitis ([place])   Polymyalgia rheumatica (HCC)   Sepsis secondary to UTI (HCC)   GERD (gastroesophageal reflux disease)   Hyponatremia   Sepsis ([place])   Sepsis due to Gram negative bacteria (HCC)   Citrobacter infection   Hypokalemia   Chronic combined systolic and diastolic CHF (congestive heart failure) ([place])   Sepsis secondary to urinary tract infection Pyelonephritis Patient appears to be improving well. She has read afebrile for last 24 hours. Urine culture positive for Citrobacter. Sensitivities available. -discontinue cefepime -contineu ciprofloxacin 500mg  BID -Continue IV fluids -Blood cultures pending, no growth 4 days  Hyponatremia Improved -Continue normal saline IVF  Hypokalemia Hypomagnesemia Potassium slightly low. A medium has been repleted and his slightly above upper limits of normal. Magnesium goal greater than 2 -Repeat BMP in the morning -Repeat magnesium in the morning -Continue to replete potassium as needed  Tachycardia Patient has a diagnosis of atrial tachycardia and has been seen by cardiology. Metoprolol increased and started on cardizem  AKI Appears resolved possibly prerenal azotemia secondary to hydration  Chronic diastolic CHF Asymptomatic. She is tolerating fluids. -Monitor fluid status  Elevated troponin Likely secondary to demand. Patient is asymptomatic. Troponin has remained  stable  GERD -Continue Pepcid  Hypertension Blood pressures are slightly above normotensive. -Continue metoprolol -Continue home Lotensin/hctz  Polymyalgia rheumatica -Continue prednisone 5 mg  Ulcerative colitis -Continue mesalamine 1.2 g daily  History of CVA -Continue atorvastatin  DVT prophylaxis: Heparin subcutaneous Code Status: DO NOT RESUSCITATE  Family Communication: No family at bedside. Disposition Plan: Likely discharge in 2 days as patient's recurring tachycardia was unforseen. Plan is still to discharge to SNF   Consultants:  Pharmacy   Procedures:  Echo pending  Antimicrobials:  Cefepime (9/4>>9/7)  Ciprofloxacin (9/8>>   Subjective: Patient reports no problems overnight.  Objective: Vitals:   11/30/15 0815 11/30/15 0830 11/30/15 0845 11/30/15 0858  BP: (!) 140/92 (!) 151/93 123/80   Pulse:    (!) 118  Resp:      Temp:      TempSrc:      SpO2:      Weight:      Height:        Intake/Output Summary (Last 24 hours) at 11/30/15 1048 Last data filed at 11/30/15 0855  Gross per 24 hour  Intake             1120 ml  Output              350 ml  Net              770 ml   Filed Weights   11/28/15 0602 11/29/15 0443 11/30/15 0617  Weight: 51.8 kg (114 lb 3.2 oz) 50.1 kg (110 lb 7.2 oz) 50.2 kg (110 lb 10.7 oz)    Examination:  General exam: Appears calm and comfortable Respiratory system: Clear to auscultation. Respiratory effort normal. Cardiovascular system: S1 & S2 heard, RRR. No murmurs, rubs, gallops or clicks Gastrointestinal system: Abdomen is nondistended,  soft and nontender. No organomegaly or masses felt. Normal bowel sounds heard. Central nervous system: Alert and oriented x3 Extremities: No cyanosis. No lower extremity edema.  Skin: No rashes, lesions or ulcers Psychiatry: Judgement and insight appear normal. Mood & affect appropriate.     Data Reviewed: I have personally reviewed following labs and imaging  studies  CBC:  Recent Labs Lab 11/25/15 0512 11/26/15 1013 11/27/15 0243  WBC 9.9 13.3* 11.5*  NEUTROABS  --  11.3*  --   HGB 13.0 13.4 12.0  HCT 39.3 39.1 36.4  MCV 90.1 88.1 89.4  PLT 185 209 [id number]   Basic Metabolic Panel:  Recent Labs Lab 11/26/15 1013 11/26/15 2300 11/27/15 0243 11/27/15 1435 11/28/15 0849 11/29/15 1149  NA 123* 127* 130*  --  131* 131*  K 3.5 2.8* 2.4* 3.2* 3.3* 3.5  CL 85* 95* 93*  --  93* 95*  CO2 26 27 26   --  27 28  GLUCOSE 173* 133* 117*  --  171* 177*  BUN 24* 21* 18  --  14 13  CREATININE 1.26* 1.08* 1.02*  --  0.93 0.90  CALCIUM 10.7* 8.6* 8.3*  --  8.4* 8.2*  MG  --   --  1.6* 1.7 2.6*  --    GFR: Estimated Creatinine Clearance: 42.1 mL/min (by C-G formula based on SCr of 0.9 mg/dL). Liver Function Tests:  Recent Labs Lab 11/25/15 0512 11/26/15 1013 11/27/15 0243  AST 31 21 18   ALT 20 20 16   ALKPHOS 75 64 57  BILITOT 1.0 0.8 0.9  PROT 7.5 7.3 6.1*  ALBUMIN 4.1 4.0 3.4*   No results for input(s): LIPASE, AMYLASE in the last 168 hours. No results for input(s): AMMONIA in the last 168 hours. Coagulation Profile:  Recent Labs Lab 11/26/15 1640  INR 1.08   Cardiac Enzymes:  Recent Labs Lab 11/26/15 1013 11/26/15 1640 11/26/15 2300  TROPONINI 0.06* 0.07* 0.07*   BNP (last 3 results) No results for input(s): PROBNP in the last 8760 hours. HbA1C: No results for input(s): HGBA1C in the last 72 hours. CBG: No results for input(s): GLUCAP in the last 168 hours. Lipid Profile: No results for input(s): CHOL, HDL, LDLCALC, TRIG, CHOLHDL, LDLDIRECT in the last 72 hours. Thyroid Function Tests: No results for input(s): TSH, T4TOTAL, FREET4, T3FREE, THYROIDAB in the last 72 hours. Anemia Panel: No results for input(s): VITAMINB12, FOLATE, FERRITIN, TIBC, IRON, RETICCTPCT in the last 72 hours. Sepsis Labs:  Recent Labs Lab 11/26/15 1020 11/26/15 1640 11/26/15 1851  PROCALCITON  --  <0.10  --   LATICACIDVEN 1.46 0.9  0.9    Recent Results (from the past 240 hour(s))  Urine culture     Status: Abnormal   Collection Time: 11/25/15  5:35 AM  Result Value Ref Range Status   Specimen Description URINE, CATHETERIZED  Final   Special Requests NONE  Final   Culture >=100,000 COLONIES/mL CITROBACTER FREUNDII (A)  Final   Report Status 11/27/2015 FINAL  Final   Organism ID, Bacteria CITROBACTER FREUNDII (A)  Final      Susceptibility   Citrobacter freundii - MIC*    CEFAZOLIN >=64 RESISTANT Resistant     CEFTRIAXONE <=1 SENSITIVE Sensitive     CIPROFLOXACIN <=0.25 SENSITIVE Sensitive     GENTAMICIN <=1 SENSITIVE Sensitive     IMIPENEM 1 SENSITIVE Sensitive     NITROFURANTOIN <=16 SENSITIVE Sensitive     TRIMETH/SULFA <=20 SENSITIVE Sensitive     PIP/TAZO <=4 SENSITIVE Sensitive     * >=  100,000 COLONIES/mL CITROBACTER FREUNDII  Blood Culture (routine x 2)     Status: None (Preliminary result)   Collection Time: 11/26/15 10:19 AM  Result Value Ref Range Status   Specimen Description BLOOD RIGHT ANTECUBITAL  Final   Special Requests BOTTLES DRAWN AEROBIC AND ANAEROBIC 5CC  Final   Culture NO GROWTH 4 DAYS  Final   Report Status PENDING  Incomplete  Urine culture     Status: None   Collection Time: 11/26/15 10:21 AM  Result Value Ref Range Status   Specimen Description URINE, CATHETERIZED  Final   Special Requests NONE  Final   Culture NO GROWTH  Final   Report Status 11/27/2015 FINAL  Final  Blood Culture (routine x 2)     Status: None (Preliminary result)   Collection Time: 11/26/15 10:45 AM  Result Value Ref Range Status   Specimen Description BLOOD RIGHT FOREARM  Final   Special Requests BOTTLES DRAWN AEROBIC AND ANAEROBIC 2.5CC  Final   Culture NO GROWTH 4 DAYS  Final   Report Status PENDING  Incomplete  MRSA PCR Screening     Status: None   Collection Time: 11/26/15  2:40 PM  Result Value Ref Range Status   MRSA by PCR NEGATIVE NEGATIVE Final    Comment:        The GeneXpert MRSA Assay  (FDA approved for NASAL specimens only), is one component of a comprehensive MRSA colonization surveillance program. It is not intended to diagnose MRSA infection nor to guide or monitor treatment for MRSA infections.          Radiology Studies: No results found.      Scheduled Meds: . atorvastatin  10 mg Oral q1800  . benazepril  20 mg Oral Daily   And  . hydrochlorothiazide  12.5 mg Oral Daily  . ciprofloxacin  500 mg Oral BID  . clopidogrel  75 mg Oral Daily  . famotidine  20 mg Oral Daily  . heparin  5,000 Units Subcutaneous Q8H  . mesalamine  2.4 g Oral Q breakfast  . metoprolol tartrate  50 mg Oral BID  . predniSONE  10 mg Oral Q breakfast  . senna  2 tablet Oral QHS  . sodium chloride flush  3 mL Intravenous Q12H   Continuous Infusions: . sodium chloride 100 mL/hr at 11/30/15 0300  . diltiazem (CARDIZEM) infusion 7.5 mg/hr (11/30/15 0746)     LOS: 4 days     Cordelia Poche, MD [facility] 11/30/2015, 10:48 AM Pager: [phone]  If 7PM-7AM, please contact night-coverage [email or web address] Password [facility] 11/30/2015, 10:48 AM

## 2015-11-30 NOTE — Progress Notes (Signed)
Received a call from CCMD about pt's HR being around 210. I explained to them that pt was in the process of getting to the bedside commode. And even though has being in 150s of HR, pt was at this moment using all the strenght she had to move which could be the reason for her HR to rise up extremely high (I note, HR of high 150s is high). After pt was done using the commode and helped her back into a resting position, NP was notified and orders was carried out as is. HR reduced to the 130s with pt being asyptomathic.At this moment V/S will be frequently monitored.

## 2015-11-30 NOTE — Progress Notes (Signed)
Pt's HR high again in the high 150s NP notified and orders carried out. Pt still asymptomatic V/S rechecked 30 mins post medication, HR in 120s, still asymptomatic. NP notified. Will cont to monitor.

## 2015-11-30 NOTE — Progress Notes (Signed)
Dr. Eula Fried notified of pt's magnesium and potassium results, awaiting call back at this time.

## 2015-11-30 NOTE — Progress Notes (Signed)
HR and BP elevated this PM after pt taken to bedside commode. MD paged. Orders received by pt's RN for IV Metoprolol. Scheduled PO Metoprolol also given by pt's RN. BP down to 102/80 upon reassessment, but HR still in the 120s-150s. EKG performed by this RN showed A fib with RVR. MD paged again. Will continue to monitor.

## 2015-11-30 NOTE — Progress Notes (Signed)
Pt HR up to 160s. HR not sustained in 160s. BP 101/60. Pt asymptomatic. Paged cardiology PA to make them aware. Will continue to monitor.

## 2015-11-30 NOTE — Progress Notes (Signed)
SUBJECTIVE: The patient says she is "sore" today. No chest pain, shortness of breath, palpitations. BP sable  CURRENT MEDICATIONS: . atorvastatin  10 mg Oral q1800  . benazepril  20 mg Oral Daily   And  . hydrochlorothiazide  12.5 mg Oral Daily  . ciprofloxacin  500 mg Oral BID  . clopidogrel  75 mg Oral Daily  . diltiazem  30 mg Oral Q6H  . famotidine  20 mg Oral Daily  . heparin  5,000 Units Subcutaneous Q8H  . mesalamine  2.4 g Oral Q breakfast  . metoprolol tartrate  50 mg Oral BID  . predniSONE  10 mg Oral Q breakfast  . senna  2 tablet Oral QHS  . sodium chloride flush  3 mL Intravenous Q12H   . sodium chloride 100 mL/hr at 11/30/15 0300    OBJECTIVE: Physical Exam: Vitals:   11/30/15 0815 11/30/15 0830 11/30/15 0845 11/30/15 0858  BP: (!) 140/92 (!) 151/93 123/80   Pulse:    (!) 118  Resp:      Temp:      TempSrc:      SpO2:      Weight:      Height:        Intake/Output Summary (Last 24 hours) at 11/30/15 1107 Last data filed at 11/30/15 0855  Gross per 24 hour  Intake             1120 ml  Output              350 ml  Net              770 ml    Telemetry reveals sinus rhythm with atrial tachycardia  GEN- The patient is well appearing, alert and oriented x 3 today.   Head- normocephalic, atraumatic Eyes-  Sclera clear, conjunctiva pink Ears- hearing intact Oropharynx- clear Neck- supple  Lungs- Clear to ausculation bilaterally, normal work of breathing Heart- Regular rate and rhythm  GI- soft, NT, ND, + BS Extremities- no clubbing, cyanosis, or edema Skin- no rash or lesion Psych- euthymic mood, full affect Neuro- strength and sensation are intact  LABS: Basic Metabolic Panel:  Recent Labs  11/27/15 1435 11/28/15 0849 11/29/15 1149  NA  --  131* 131*  K 3.2* 3.3* 3.5  CL  --  93* 95*  CO2  --  27 28  GLUCOSE  --  171* 177*  BUN  --  14 13  CREATININE  --  0.93 0.90  CALCIUM  --  8.4* 8.2*  MG 1.7 2.6*  --     RADIOLOGY: Dg  Chest 2 View  Result Date: 11/25/2015 CLINICAL DATA:  Acute onset of generalized weakness. Initial encounter. EXAM: CHEST  2 VIEW COMPARISON:  Chest radiograph performed 09/21/2015 FINDINGS: The lungs are hyperexpanded, with flattening of the hemidiaphragms, compatible with COPD. Mild left basilar atelectasis is noted. There is no evidence of pleural effusion or pneumothorax. The heart is borderline enlarged. No acute osseous abnormalities are seen. Postoperative change is noted overlying the mediastinum. IMPRESSION: Mild left basilar atelectasis noted. Borderline cardiomegaly. Findings of COPD. Electronically Signed   By: Garald Balding M.D.   On: 11/25/2015 06:12   ASSESSMENT AND PLAN:  Active Problems:   Dyslipidemia   Essential hypertension   Ulcerative colitis ([place])   Polymyalgia rheumatica (HCC)   Sepsis secondary to UTI ([place])   GERD (gastroesophageal reflux disease)   Hyponatremia   Sepsis ([place])   Sepsis due to Gram  negative bacteria (HCC)   Citrobacter infection   Hypokalemia   Chronic combined systolic and diastolic CHF (congestive heart failure) ([place])   1.  Asymptomatic atrial tachycardia Start Cardizem 30mg  po q6 hours  Can convert to long acting tomorrow if she tolerates well Continue Metoprolol - dose increased to 50mg  this morning, up-titrate as needed No AF seen on telemetry  Chanetta Marshall, NP 11/30/2015 11:09 AM  I have seen and examined this patient with Chanetta Marshall.  Agree with above, note added to reflect my findings.  On exam, regular rhythm, no murmurs, lungs clear.  Continued likely atrial tachycardia, possibly from the pulmonary vein.  Plan for switching IV cardiazem to PO.  Can tolerate as needed.      Karsen Nakanishi M. Kijuan Gallicchio MD 11/30/2015 11:49 AM

## 2015-11-30 NOTE — Progress Notes (Signed)
Spoke with [facility]. Orders being changed for PO metoprolol. She will come see pt and holding off on transfer at this time because pt is asymptomatic. Will continue to monitor.

## 2015-11-30 NOTE — Progress Notes (Signed)
   11/30/15 1951  Vitals  Temp 98 F (36.7 C)  Temp Source Oral  BP (!) 102/56  MAP (mmHg) 66  BP Location Right Arm  BP Method Automatic  Patient Position (if appropriate) Lying  Pulse Rate (!) 105  Resp 17  Oxygen Therapy  SpO2 97 %  O2 Device Room Air  Pt started on cardizem drip, vital signs obtained, pt asymptomatic resting in bed comfortably. Will continue to monitor.

## 2015-11-30 NOTE — Progress Notes (Signed)
Pt HR sustaining in 140s. Cardizem titrated to 7.5mg /hr. Pt asymptomatic. Paged MD. Will continue to monitor.

## 2015-11-30 NOTE — Clinical Social Work Placement (Signed)
   CLINICAL SOCIAL WORK PLACEMENT  NOTE  Date:  11/30/2015  Patient Details  Name: Tammy Beard MRN: [id number] Date of Birth: 1940-12-16  Clinical Social Work is seeking post-discharge placement for this patient at the [place] level of care (*CSW will initial, date and re-position this form in  chart as items are completed):  Yes   Patient/family provided with [facility] Work Department's list of facilities offering this level of care within the geographic area requested by the patient (or if unable, by the patient's family).  Yes   Patient/family informed of their freedom to choose among providers that offer the needed level of care, that participate in Medicare, Medicaid or managed care program needed by the patient, have an available bed and are willing to accept the patient.  Yes   Patient/family informed of [facility]'s ownership interest in [facility] and [facility], as well as of the fact that they are under no obligation to receive care at these facilities.  PASRR submitted to EDS on 11/30/15     PASRR number received on       Existing PASRR number confirmed on 11/30/15     FL2 transmitted to all facilities in geographic area requested by pt/family on 11/30/15     FL2 transmitted to all facilities within larger geographic area on       Patient informed that his/her managed care company has contracts with or will negotiate with certain facilities, including the following:            Patient/family informed of bed offers received.  Patient chooses bed at       Physician recommends and patient chooses bed at      Patient to be transferred to   on  .  Patient to be transferred to facility by       Patient family notified on   of transfer.  Name of family member notified:        PHYSICIAN Please sign FL2     Additional Comment:    _______________________________________________ Candie Chroman, LCSW 11/30/2015,  10:33 AM

## 2015-12-01 LAB — CBC
HEMATOCRIT: 30.9 % — AB (ref 36.0–46.0)
HEMOGLOBIN: 9.8 g/dL — AB (ref 12.0–15.0)
MCH: 29.2 pg (ref 26.0–34.0)
MCHC: 31.7 g/dL (ref 30.0–36.0)
MCV: 92 fL (ref 78.0–100.0)
Platelets: 136 10*3/uL — ABNORMAL LOW (ref 150–400)
RBC: 3.36 MIL/uL — AB (ref 3.87–5.11)
RDW: 12.8 % (ref 11.5–15.5)
WBC: 6.5 10*3/uL (ref 4.0–10.5)

## 2015-12-01 LAB — CULTURE, BLOOD (ROUTINE X 2)
CULTURE: NO GROWTH
CULTURE: NO GROWTH

## 2015-12-01 LAB — BASIC METABOLIC PANEL
ANION GAP: 7 (ref 5–15)
BUN: 12 mg/dL (ref 6–20)
CHLORIDE: 98 mmol/L — AB (ref 101–111)
CO2: 27 mmol/L (ref 22–32)
Calcium: 8.2 mg/dL — ABNORMAL LOW (ref 8.9–10.3)
Creatinine, Ser: 0.95 mg/dL (ref 0.44–1.00)
GFR calc non Af Amer: 57 mL/min — ABNORMAL LOW (ref >60)
Glucose, Bld: 102 mg/dL — ABNORMAL HIGH (ref 65–99)
POTASSIUM: 3 mmol/L — AB (ref 3.5–5.1)
SODIUM: 132 mmol/L — AB (ref 135–145)

## 2015-12-01 LAB — MAGNESIUM: Magnesium: 1.8 mg/dL (ref 1.7–2.4)

## 2015-12-01 MED ORDER — POTASSIUM CHLORIDE CRYS ER 20 MEQ PO TBCR
40.0000 meq | EXTENDED_RELEASE_TABLET | Freq: Once | ORAL | Status: AC
  Administered 2015-12-01: 40 meq via ORAL
  Filled 2015-12-01: qty 2

## 2015-12-01 NOTE — Progress Notes (Signed)
SUBJECTIVE: Denies palps.   ROS: Other than pertinent positives in "Subjective", all others were reviewed and found to be negative.   Intake/Output Summary (Last 24 hours) at 12/01/15 1112 Last data filed at 12/01/15 1024  Gross per 24 hour  Intake          3290.51 ml  Output              300 ml  Net          2990.51 ml    Current Facility-Administered Medications  Medication Dose Route Frequency Provider Last Rate Last Dose  . 0.9 %  sodium chloride infusion   Intravenous Continuous Waldemar Dickens, MD 100 mL/hr at 12/01/15 0604    . acetaminophen (TYLENOL) tablet 650 mg  650 mg Oral Q6H PRN Waldemar Dickens, MD       Or  . acetaminophen (TYLENOL) suppository 650 mg  650 mg Rectal Q6H PRN Waldemar Dickens, MD      . atorvastatin (LIPITOR) tablet 10 mg  10 mg Oral q1800 Waldemar Dickens, MD   10 mg at 11/30/15 1755  . benazepril (LOTENSIN) tablet 20 mg  20 mg Oral Daily Mariel Aloe, MD   20 mg at 12/01/15 1101   And  . hydrochlorothiazide (MICROZIDE) capsule 12.5 mg  12.5 mg Oral Daily Mariel Aloe, MD   12.5 mg at 12/01/15 1101  . ciprofloxacin (CIPRO) tablet 500 mg  500 mg Oral BID Mariel Aloe, MD   500 mg at 12/01/15 0848  . clopidogrel (PLAVIX) tablet 75 mg  75 mg Oral Daily Waldemar Dickens, MD   75 mg at 12/01/15 1100  . diltiazem (CARDIZEM) 100 mg in dextrose 5% 1100mL (1 mg/mL) infusion  5 mg/hr Intravenous Continuous Dayna N Dunn, PA-C 5 mL/hr at 12/01/15 0730 5 mg/hr at 12/01/15 0730  . dimenhyDRINATE (DRAMAMINE) tablet 50 mg  50 mg Oral Q6H PRN Waldemar Dickens, MD      . famotidine (PEPCID) tablet 20 mg  20 mg Oral Daily Waldemar Dickens, MD   20 mg at 12/01/15 1101  . heparin injection 5,000 Units  5,000 Units Subcutaneous Q8H Waldemar Dickens, MD   5,000 Units at 12/01/15 0602  . hydrALAZINE (APRESOLINE) injection 5-10 mg  5-10 mg Intravenous Q4H PRN Waldemar Dickens, MD   5 mg at 11/28/15 2319  . mesalamine (LIALDA) EC tablet 2.4 g  2.4 g Oral Q breakfast  Mariel Aloe, MD   2.4 g at 12/01/15 0848  . metoprolol (LOPRESSOR) tablet 50 mg  50 mg Oral BID Patsey Berthold, NP   50 mg at 12/01/15 1100  . oxyCODONE (Oxy IR/ROXICODONE) immediate release tablet 5 mg  5 mg Oral Q4H PRN Waldemar Dickens, MD      . predniSONE (DELTASONE) tablet 10 mg  10 mg Oral Q breakfast Mariel Aloe, MD   10 mg at 12/01/15 0848  . senna (SENOKOT) tablet 17.2 mg  2 tablet Oral QHS Mariel Aloe, MD   17.2 mg at 11/30/15 2203  . sodium chloride flush (NS) 0.9 % injection 3 mL  3 mL Intravenous Q12H Waldemar Dickens, MD   3 mL at 11/30/15 2204    Vitals:   11/30/15 2210 12/01/15 0008 12/01/15 0544 12/01/15 1100  BP:  138/78 123/70 139/70  Pulse: 94 78 66 74  Resp:   18   Temp:  97.8 F (36.6 C)  TempSrc:  Oral    SpO2:  97% 100% 100%  Weight:   114 lb 3.2 oz (51.8 kg)   Height:        PHYSICAL EXAM GEN- The patient is well appearing, alert and oriented x 3 today.   Head- normocephalic, atraumatic Eyes-  Sclera clear, conjunctiva pink Ears- hearing intact Oropharynx- clear Neck- supple  Lungs- Clear to ausculation bilaterally, normal work of breathing Heart- Regular rate and rhythm  GI- soft, NT, ND, + BS Extremities- no clubbing, cyanosis, or edema   TELEMETRY: Reviewed telemetry pt in NSR with PAC's, MAT  LABS: Basic Metabolic Panel:  Recent Labs  11/30/15 1941 12/01/15 0224  NA 127* 132*  K 3.3* 3.0*  CL 95* 98*  CO2 23 27  GLUCOSE 229* 102*  BUN 14 12  CREATININE 0.97 0.95  CALCIUM 8.1* 8.2*  MG 1.8 1.8   Liver Function Tests: No results for input(s): AST, ALT, ALKPHOS, BILITOT, PROT, ALBUMIN in the last 72 hours. No results for input(s): LIPASE, AMYLASE in the last 72 hours. CBC:  Recent Labs  12/01/15 0224  WBC 6.5  HGB 9.8*  HCT 30.9*  MCV 92.0  PLT 136*   Cardiac Enzymes: No results for input(s): CKTOTAL, CKMB, CKMBINDEX, TROPONINI in the last 72 hours. BNP: Invalid input(s): POCBNP D-Dimer: No results for  input(s): DDIMER in the last 72 hours. Hemoglobin A1C: No results for input(s): HGBA1C in the last 72 hours. Fasting Lipid Panel: No results for input(s): CHOL, HDL, LDLCALC, TRIG, CHOLHDL, LDLDIRECT in the last 72 hours. Thyroid Function Tests: No results for input(s): TSH, T4TOTAL, T3FREE, THYROIDAB in the last 72 hours.  Invalid input(s): FREET3 Anemia Panel: No results for input(s): VITAMINB12, FOLATE, FERRITIN, TIBC, IRON, RETICCTPCT in the last 72 hours.  RADIOLOGY: Dg Chest 2 View  Result Date: 11/25/2015 CLINICAL DATA:  Acute onset of generalized weakness. Initial encounter. EXAM: CHEST  2 VIEW COMPARISON:  Chest radiograph performed 09/21/2015 FINDINGS: The lungs are hyperexpanded, with flattening of the hemidiaphragms, compatible with COPD. Mild left basilar atelectasis is noted. There is no evidence of pleural effusion or pneumothorax. The heart is borderline enlarged. No acute osseous abnormalities are seen. Postoperative change is noted overlying the mediastinum. IMPRESSION: Mild left basilar atelectasis noted. Borderline cardiomegaly. Findings of COPD. Electronically Signed   By: Garald Balding M.D.   On: 11/25/2015 06:12   Ct Head Wo Contrast  Result Date: 11/26/2015 CLINICAL DATA:  75 year old female with altered mental status. EXAM: CT HEAD WITHOUT CONTRAST TECHNIQUE: Contiguous axial images were obtained from the base of the skull through the vertex without intravenous contrast. COMPARISON:  11/25/2015 and prior CTs dating back to 09/21/2015 FINDINGS: Brain: No evidence of acute infarction, hemorrhage, hydrocephalus, extra-axial collection or mass lesion/mass effect. Atrophy and chronic small-vessel white matter ischemic changes again identified. Vascular: Intracranial atherosclerotic calcifications noted. Skull: Unremarkable Sinuses/Orbits: Visualized portions unremarkable. Other: None IMPRESSION: No evidence of acute intracranial abnormality. Atrophy and chronic small-vessel  white matter ischemic changes. Electronically Signed   By: Margarette Canada M.D.   On: 11/26/2015 11:25   Ct Head Wo Contrast  Result Date: 11/25/2015 CLINICAL DATA:  Acute onset of generalized weakness and dizziness. Initial encounter. EXAM: CT HEAD WITHOUT CONTRAST TECHNIQUE: Contiguous axial images were obtained from the base of the skull through the vertex without intravenous contrast. COMPARISON:  CTA of the head performed 09/24/2015, and MRA of the brain performed 09/22/2015 FINDINGS: Brain: No evidence of acute infarction, hemorrhage, hydrocephalus, extra-axial collection or mass lesion/mass  effect. Prominence of ventricles and sulci reflects mild to moderate cortical volume loss. Cerebellar atrophy is noted. Scattered periventricular white matter change likely reflects small vessel ischemic microangiopathy. The brainstem and fourth ventricle are within normal limits. The basal ganglia are unremarkable in appearance. The cerebral hemispheres demonstrate grossly normal gray-white differentiation. No mass effect or midline shift is seen. Vascular: No hyperdense vessel or unexpected calcification. Skull: There is no evidence of fracture; visualized osseous structures are unremarkable in appearance. Sinuses/Orbits: The orbits are within normal limits. The paranasal sinuses are well-aerated. There is partial opacification of the mastoid air cells bilaterally. Other: No significant soft tissue abnormalities are seen. IMPRESSION: 1. No acute intracranial pathology seen on CT. 2. Mild to moderate cortical volume loss and scattered small vessel ischemic microangiopathy. 3. Partial opacification of the mastoid air cells bilaterally. Electronically Signed   By: Garald Balding M.D.   On: 11/25/2015 06:27   Dg Chest Port 1 View  Result Date: 11/26/2015 CLINICAL DATA:  75 year old female with a history of altered mental status EXAM: PORTABLE CHEST 1 VIEW COMPARISON:  11/25/2015, 09/21/2015 FINDINGS: Cardiomediastinal  silhouette unchanged in size and contour. Calcifications of the aortic arch. Since the prior there has been removal of the event recorder on the chest wall. Stigmata of emphysema, with flattened hemidiaphragms, and hyperinflation. Chronic interstitial opacities persist. Pleural parenchymal thickening at the lung apices. No pleural effusion or confluent airspace disease.  No pneumothorax. No displaced fracture. IMPRESSION: Chronic lung changes and emphysema without evidence of superimposed acute cardiopulmonary disease. Aortic atherosclerosis. Signed, Dulcy Fanny. Earleen Newport, DO [facility] Electronically Signed   By: Corrie Mckusick D.O.   On: 11/26/2015 10:37      ASSESSMENT AND PLAN: 1. Asymptomatic atrial tachycardia: For now continue IV diltiazem. May switch to oral tomorrow. Continue metoprolol 50 mg BID.  2. Hypokalemia: K 3. Will replete.  3. HTN: Normal. No changes.   Kate Sable, M.D., F.A.C.C.

## 2015-12-01 NOTE — Progress Notes (Addendum)
PROGRESS NOTE    Tammy Beard  [id number] DOB: [birth year] DOA: 11/26/2015 PCP: Jeanmarie Hubert, MD   Brief Narrative: Tammy Beard is a 75 year old female with history of easy bruising, GERD, osteopenia, polio, polymyalgia rheumatica, rheumatic fever, CVA, ulcers colitis. She presented with altered mental status. Symptoms likely secondary to metabolic encephalopathy from urinary tract infection. She has continued to improve with antibiotic therapy.   Assessment & Plan:   Active Problems:   Dyslipidemia   Essential hypertension   Ulcerative colitis ([place])   Polymyalgia rheumatica (HCC)   Sepsis secondary to UTI (HCC)   GERD (gastroesophageal reflux disease)   Hyponatremia   Sepsis ([place])   Sepsis due to Gram negative bacteria (H[facility])   Citrobacter infection   Hypokalemia   Chronic combined systolic and diastolic CHF (congestive heart failure) ([place])   Sepsis secondary to urinary tract infection Pyelonephritis Patient appears to be improving well. She has read afebrile for last 24 hours. Urine culture positive for Citrobacter. Sensitivities available. -continue ciprofloxacin 500mg  BID -Continue IV fluids -Blood cultures pending, no growth 5 days  Hyponatremia Improved -Continue normal saline IVF  Hypokalemia Hypomagnesemia Potassium slightly low. A medium has been repleted and his slightly above upper limits of normal. -Repeat BMP in the morning -Continue to replete potassium as needed  Atrial Tachycardia Patient has a diagnosis of atrial tachycardia and has been seen by cardiology. Metoprolol increased and restarted on cardizem IV  AKI Appears resolved possibly prerenal azotemia secondary to hydration  Chronic diastolic CHF Asymptomatic. She is tolerating fluids. Weight has remained stable -Monitor fluid status  Elevated troponin Likely secondary to demand. Patient is asymptomatic. Troponin has remained stable.  GERD -Continue  Pepcid  Hypertension Controlled -Continue metoprolol -Continue home Lotensin/hctz -Cardiology titrating diltiazem for tachycardia  Polymyalgia rheumatica -Continue prednisone 5 mg  Ulcerative colitis -Continue mesalamine 1.2 g daily  History of CVA -Continue atorvastatin  DVT prophylaxis: Heparin subcutaneous Code Status: DO NOT RESUSCITATE  Family Communication: Called sister, Shavelle Tep, to update on plan. Disposition Plan: Likely discharge in 1-2 days as patient's recurring tachycardia was unforseen. Plan is still to discharge to SNF   Consultants:  Pharmacy   Procedures:  Echo (9/6)  Antimicrobials:  Cefepime (9/4>>9/7)  Ciprofloxacin (9/8>>   Subjective: Patient reports no problems overnight. Very conversant today  Objective: Vitals:   11/30/15 2210 12/01/15 0008 12/01/15 0544 12/01/15 1100  BP:  138/78 123/70 139/70  Pulse: 94 78 66 74  Resp:   18   Temp:  97.8 F (36.6 C)    TempSrc:  Oral    SpO2:  97% 100% 100%  Weight:   51.8 kg (114 lb 3.2 oz)   Height:        Intake/Output Summary (Last 24 hours) at 12/01/15 1151 Last data filed at 12/01/15 1024  Gross per 24 hour  Intake          3290.51 ml  Output              300 ml  Net          2990.51 ml   Filed Weights   11/29/15 0443 11/30/15 0617 12/01/15 0544  Weight: 50.1 kg (110 lb 7.2 oz) 50.2 kg (110 lb 10.7 oz) 51.8 kg (114 lb 3.2 oz)    Examination:  General exam: Appears calm and comfortable Respiratory system: Clear to auscultation. Respiratory effort normal. Cardiovascular system: S1 & S2 heard, RRR. No murmurs, rubs, gallops or clicks Gastrointestinal system: Abdomen is nondistended, soft  and nontender. No organomegaly or masses felt. Normal bowel sounds heard. Central nervous system: Alert and oriented x3 Extremities: No cyanosis. No lower extremity edema.  Skin: No rashes, lesions or ulcers Psychiatry: Judgement and insight appear normal. Mood & affect appropriate.      Data Reviewed: I have personally reviewed following labs and imaging studies  CBC:  Recent Labs Lab 11/25/15 0512 11/26/15 1013 11/27/15 0243 12/01/15 0224  WBC 9.9 13.3* 11.5* 6.5  NEUTROABS  --  11.3*  --   --   HGB 13.0 13.4 12.0 9.8*  HCT 39.3 39.1 36.4 30.9*  MCV 90.1 88.1 89.4 92.0  PLT 185 209 163 [id number]*   Basic Metabolic Panel:  Recent Labs Lab 11/27/15 0243 11/27/15 1435 11/28/15 0849 11/29/15 1149 11/30/15 1941 12/01/15 0224  NA 130*  --  131* 131* 127* 132*  K 2.4* 3.2* 3.3* 3.5 3.3* 3.0*  CL 93*  --  93* 95* 95* 98*  CO2 26  --  27 28 23 27   GLUCOSE 117*  --  171* 177* 229* 102*  BUN 18  --  14 13 14 12   CREATININE 1.02*  --  0.93 0.90 0.97 0.95  CALCIUM 8.3*  --  8.4* 8.2* 8.1* 8.2*  MG 1.6* 1.7 2.6*  --  1.8 1.8   GFR: Estimated Creatinine Clearance: 41.2 mL/min (by C-G formula based on SCr of 0.95 mg/dL). Liver Function Tests:  Recent Labs Lab 11/25/15 0512 11/26/15 1013 11/27/15 0243  AST 31 21 18   ALT 20 20 16   ALKPHOS 75 64 57  BILITOT 1.0 0.8 0.9  PROT 7.5 7.3 6.1*  ALBUMIN 4.1 4.0 3.4*   No results for input(s): LIPASE, AMYLASE in the last 168 hours. No results for input(s): AMMONIA in the last 168 hours. Coagulation Profile:  Recent Labs Lab 11/26/15 1640  INR 1.08   Cardiac Enzymes:  Recent Labs Lab 11/26/15 1013 11/26/15 1640 11/26/15 2300  TROPONINI 0.06* 0.07* 0.07*   BNP (last 3 results) No results for input(s): PROBNP in the last 8760 hours. HbA1C: No results for input(s): HGBA1C in the last 72 hours. CBG: No results for input(s): GLUCAP in the last 168 hours. Lipid Profile: No results for input(s): CHOL, HDL, LDLCALC, TRIG, CHOLHDL, LDLDIRECT in the last 72 hours. Thyroid Function Tests: No results for input(s): TSH, T4TOTAL, FREET4, T3FREE, THYROIDAB in the last 72 hours. Anemia Panel: No results for input(s): VITAMINB12, FOLATE, FERRITIN, TIBC, IRON, RETICCTPCT in the last 72 hours. Sepsis  Labs:  Recent Labs Lab 11/26/15 1020 11/26/15 1640 11/26/15 1851  PROCALCITON  --  <0.10  --   LATICACIDVEN 1.46 0.9 0.9    Recent Results (from the past 240 hour(s))  Urine culture     Status: Abnormal   Collection Time: 11/25/15  5:35 AM  Result Value Ref Range Status   Specimen Description URINE, CATHETERIZED  Final   Special Requests NONE  Final   Culture >=100,000 COLONIES/mL CITROBACTER FREUNDII (A)  Final   Report Status 11/27/2015 FINAL  Final   Organism ID, Bacteria CITROBACTER FREUNDII (A)  Final      Susceptibility   Citrobacter freundii - MIC*    CEFAZOLIN >=64 RESISTANT Resistant     CEFTRIAXONE <=1 SENSITIVE Sensitive     CIPROFLOXACIN <=0.25 SENSITIVE Sensitive     GENTAMICIN <=1 SENSITIVE Sensitive     IMIPENEM 1 SENSITIVE Sensitive     NITROFURANTOIN <=16 SENSITIVE Sensitive     TRIMETH/SULFA <=20 SENSITIVE Sensitive     PIP/TAZO <=  4 SENSITIVE Sensitive     * >=100,000 COLONIES/mL CITROBACTER FREUNDII  Blood Culture (routine x 2)     Status: None   Collection Time: 11/26/15 10:19 AM  Result Value Ref Range Status   Specimen Description BLOOD RIGHT ANTECUBITAL  Final   Special Requests BOTTLES DRAWN AEROBIC AND ANAEROBIC 5CC  Final   Culture NO GROWTH 5 DAYS  Final   Report Status 12/01/2015 FINAL  Final  Urine culture     Status: None   Collection Time: 11/26/15 10:21 AM  Result Value Ref Range Status   Specimen Description URINE, CATHETERIZED  Final   Special Requests NONE  Final   Culture NO GROWTH  Final   Report Status 11/27/2015 FINAL  Final  Blood Culture (routine x 2)     Status: None   Collection Time: 11/26/15 10:45 AM  Result Value Ref Range Status   Specimen Description BLOOD RIGHT FOREARM  Final   Special Requests BOTTLES DRAWN AEROBIC AND ANAEROBIC 2.5CC  Final   Culture NO GROWTH 5 DAYS  Final   Report Status 12/01/2015 FINAL  Final  MRSA PCR Screening     Status: None   Collection Time: 11/26/15  2:40 PM  Result Value Ref Range  Status   MRSA by PCR NEGATIVE NEGATIVE Final    Comment:        The GeneXpert MRSA Assay (FDA approved for NASAL specimens only), is one component of a comprehensive MRSA colonization surveillance program. It is not intended to diagnose MRSA infection nor to guide or monitor treatment for MRSA infections.          Radiology Studies: No results found.      Scheduled Meds: . atorvastatin  10 mg Oral q1800  . benazepril  20 mg Oral Daily   And  . hydrochlorothiazide  12.5 mg Oral Daily  . ciprofloxacin  500 mg Oral BID  . clopidogrel  75 mg Oral Daily  . famotidine  20 mg Oral Daily  . heparin  5,000 Units Subcutaneous Q8H  . mesalamine  2.4 g Oral Q breakfast  . metoprolol tartrate  50 mg Oral BID  . potassium chloride SA  40 mEq Oral Once  . predniSONE  10 mg Oral Q breakfast  . senna  2 tablet Oral QHS  . sodium chloride flush  3 mL Intravenous Q12H   Continuous Infusions: . sodium chloride 100 mL/hr at 12/01/15 0604  . diltiazem (CARDIZEM) infusion 5 mg/hr (12/01/15 0730)     LOS: 5 days     Cordelia Poche, MD [facility] 12/01/2015, 11:51 AM Pager: [phone]  If 7PM-7AM, please contact night-coverage [email or web address] Password [email or web address] 12/01/2015, 11:51 AM

## 2015-12-02 LAB — BASIC METABOLIC PANEL
Anion gap: 5 (ref 5–15)
BUN: 12 mg/dL (ref 6–20)
CALCIUM: 8.6 mg/dL — AB (ref 8.9–10.3)
CO2: 27 mmol/L (ref 22–32)
CREATININE: 1.08 mg/dL — AB (ref 0.44–1.00)
Chloride: 102 mmol/L (ref 101–111)
GFR calc Af Amer: 56 mL/min — ABNORMAL LOW (ref >60)
GFR, EST NON AFRICAN AMERICAN: 49 mL/min — AB (ref >60)
GLUCOSE: 114 mg/dL — AB (ref 65–99)
POTASSIUM: 4.1 mmol/L (ref 3.5–5.1)
Sodium: 134 mmol/L — ABNORMAL LOW (ref 135–145)

## 2015-12-02 LAB — CBC
HEMATOCRIT: 34.8 % — AB (ref 36.0–46.0)
HEMOGLOBIN: 10.8 g/dL — AB (ref 12.0–15.0)
MCH: 29.2 pg (ref 26.0–34.0)
MCHC: 31 g/dL (ref 30.0–36.0)
MCV: 94.1 fL (ref 78.0–100.0)
Platelets: 150 10*3/uL (ref 150–400)
RBC: 3.7 MIL/uL — AB (ref 3.87–5.11)
RDW: 13 % (ref 11.5–15.5)
WBC: 6.9 10*3/uL (ref 4.0–10.5)

## 2015-12-02 MED ORDER — DILTIAZEM HCL 60 MG PO TABS
60.0000 mg | ORAL_TABLET | Freq: Two times a day (BID) | ORAL | Status: DC
  Administered 2015-12-02 – 2015-12-03 (×3): 60 mg via ORAL
  Filled 2015-12-02 (×3): qty 1

## 2015-12-02 MED ORDER — LORAZEPAM 2 MG/ML IJ SOLN
0.5000 mg | Freq: Once | INTRAMUSCULAR | Status: AC
  Administered 2015-12-02: 0.5 mg via INTRAVENOUS
  Filled 2015-12-02: qty 1

## 2015-12-02 NOTE — Progress Notes (Signed)
SUBJECTIVE: No complaints.   ROS: Other than pertinent positives in "Subjective", all others were reviewed and found to be negative.   Intake/Output Summary (Last 24 hours) at 12/02/15 1205 Last data filed at 12/02/15 [id number]  Gross per 24 hour  Intake          2917.08 ml  Output                0 ml  Net          2917.08 ml    Current Facility-Administered Medications  Medication Dose Route Frequency Provider Last Rate Last Dose  . 0.9 %  sodium chloride infusion   Intravenous Continuous Waldemar Dickens, MD 100 mL/hr at 12/02/15 [id number]    . acetaminophen (TYLENOL) tablet 650 mg  650 mg Oral Q6H PRN Waldemar Dickens, MD       Or  . acetaminophen (TYLENOL) suppository 650 mg  650 mg Rectal Q6H PRN Waldemar Dickens, MD      . atorvastatin (LIPITOR) tablet 10 mg  10 mg Oral q1800 Waldemar Dickens, MD   10 mg at 12/01/15 1814  . benazepril (LOTENSIN) tablet 20 mg  20 mg Oral Daily Mariel Aloe, MD   20 mg at 12/02/15 1017   And  . hydrochlorothiazide (MICROZIDE) capsule 12.5 mg  12.5 mg Oral Daily Mariel Aloe, MD   12.5 mg at 12/02/15 1017  . ciprofloxacin (CIPRO) tablet 500 mg  500 mg Oral BID Mariel Aloe, MD   500 mg at 12/02/15 1016  . clopidogrel (PLAVIX) tablet 75 mg  75 mg Oral Daily Waldemar Dickens, MD   75 mg at 12/02/15 1017  . diltiazem (CARDIZEM) 100 mg in dextrose 5% 173mL (1 mg/mL) infusion  5 mg/hr Intravenous Continuous Dayna N Dunn, PA-C 5 mL/hr at 12/02/15 0245 5 mg/hr at 12/02/15 0245  . dimenhyDRINATE (DRAMAMINE) tablet 50 mg  50 mg Oral Q6H PRN Waldemar Dickens, MD      . famotidine (PEPCID) tablet 20 mg  20 mg Oral Daily Waldemar Dickens, MD   20 mg at 12/02/15 1017  . heparin injection 5,000 Units  5,000 Units Subcutaneous Q8H Waldemar Dickens, MD   5,000 Units at 12/02/15 [phone]  . hydrALAZINE (APRESOLINE) injection 5-10 mg  5-10 mg Intravenous Q4H PRN Waldemar Dickens, MD   5 mg at 11/28/15 2319  . mesalamine (LIALDA) EC tablet 2.4 g  2.4 g Oral Q breakfast  Mariel Aloe, MD   2.4 g at 12/02/15 1016  . metoprolol (LOPRESSOR) tablet 50 mg  50 mg Oral BID Patsey Berthold, NP   50 mg at 12/02/15 1017  . oxyCODONE (Oxy IR/ROXICODONE) immediate release tablet 5 mg  5 mg Oral Q4H PRN Waldemar Dickens, MD   5 mg at 12/02/15 0336  . predniSONE (DELTASONE) tablet 10 mg  10 mg Oral Q breakfast Mariel Aloe, MD   10 mg at 12/02/15 1015  . senna (SENOKOT) tablet 17.2 mg  2 tablet Oral QHS Mariel Aloe, MD   17.2 mg at 12/01/15 2231  . sodium chloride flush (NS) 0.9 % injection 3 mL  3 mL Intravenous Q12H Waldemar Dickens, MD   3 mL at 12/01/15 2231    Vitals:   12/02/15 0014 12/02/15 0327 12/02/15 0550 12/02/15 1017  BP: 140/68  129/71 (!) 154/84  Pulse: 61  62 84  Resp: 17  17  Temp: 98.8 F (37.1 C)  98.1 F (36.7 C)   TempSrc: Oral  Oral   SpO2: 100%  95%   Weight:  122 lb 14.4 oz (55.7 kg)    Height:        PHYSICAL EXAM GEN- The patient is well appearing, alert and oriented x 3 today.  Head- normocephalic, atraumatic Eyes- Sclera clear, conjunctiva pink Ears- hearing intact Oropharynx- clear Neck- supple  Lungs- Clear to ausculation bilaterally, normal work of breathing Heart- Regular rate and rhythm, no m/r/g GI- soft, NT, ND, + BS Extremities- no clubbing, cyanosis, or edema  TELEMETRY: Reviewed telemetry pt in NSR with PAC's.  LABS: Basic Metabolic Panel:  Recent Labs  11/30/15 1941 12/01/15 0224  NA 127* 132*  K 3.3* 3.0*  CL 95* 98*  CO2 23 27  GLUCOSE 229* 102*  BUN 14 12  CREATININE 0.97 0.95  CALCIUM 8.1* 8.2*  MG 1.8 1.8   Liver Function Tests: No results for input(s): AST, ALT, ALKPHOS, BILITOT, PROT, ALBUMIN in the last 72 hours. No results for input(s): LIPASE, AMYLASE in the last 72 hours. CBC:  Recent Labs  12/01/15 0224 12/02/15 1113  WBC 6.5 6.9  HGB 9.8* 10.8*  HCT 30.9* 34.8*  MCV 92.0 94.1  PLT 136* 150   Cardiac Enzymes: No results for input(s): CKTOTAL, CKMB, CKMBINDEX, TROPONINI in  the last 72 hours. BNP: Invalid input(s): POCBNP D-Dimer: No results for input(s): DDIMER in the last 72 hours. Hemoglobin A1C: No results for input(s): HGBA1C in the last 72 hours. Fasting Lipid Panel: No results for input(s): CHOL, HDL, LDLCALC, TRIG, CHOLHDL, LDLDIRECT in the last 72 hours. Thyroid Function Tests: No results for input(s): TSH, T4TOTAL, T3FREE, THYROIDAB in the last 72 hours.  Invalid input(s): FREET3 Anemia Panel: No results for input(s): VITAMINB12, FOLATE, FERRITIN, TIBC, IRON, RETICCTPCT in the last 72 hours.  RADIOLOGY: Dg Chest 2 View  Result Date: 11/25/2015 CLINICAL DATA:  Acute onset of generalized weakness. Initial encounter. EXAM: CHEST  2 VIEW COMPARISON:  Chest radiograph performed 09/21/2015 FINDINGS: The lungs are hyperexpanded, with flattening of the hemidiaphragms, compatible with COPD. Mild left basilar atelectasis is noted. There is no evidence of pleural effusion or pneumothorax. The heart is borderline enlarged. No acute osseous abnormalities are seen. Postoperative change is noted overlying the mediastinum. IMPRESSION: Mild left basilar atelectasis noted. Borderline cardiomegaly. Findings of COPD. Electronically Signed   By: Garald Balding M.D.   On: 11/25/2015 06:12   Ct Head Wo Contrast  Result Date: 11/26/2015 CLINICAL DATA:  75 year old female with altered mental status. EXAM: CT HEAD WITHOUT CONTRAST TECHNIQUE: Contiguous axial images were obtained from the base of the skull through the vertex without intravenous contrast. COMPARISON:  11/25/2015 and prior CTs dating back to 09/21/2015 FINDINGS: Brain: No evidence of acute infarction, hemorrhage, hydrocephalus, extra-axial collection or mass lesion/mass effect. Atrophy and chronic small-vessel white matter ischemic changes again identified. Vascular: Intracranial atherosclerotic calcifications noted. Skull: Unremarkable Sinuses/Orbits: Visualized portions unremarkable. Other: None IMPRESSION: No  evidence of acute intracranial abnormality. Atrophy and chronic small-vessel white matter ischemic changes. Electronically Signed   By: Margarette Canada M.D.   On: 11/26/2015 11:25   Ct Head Wo Contrast  Result Date: 11/25/2015 CLINICAL DATA:  Acute onset of generalized weakness and dizziness. Initial encounter. EXAM: CT HEAD WITHOUT CONTRAST TECHNIQUE: Contiguous axial images were obtained from the base of the skull through the vertex without intravenous contrast. COMPARISON:  CTA of the head performed 09/24/2015, and MRA of the brain  performed 09/22/2015 FINDINGS: Brain: No evidence of acute infarction, hemorrhage, hydrocephalus, extra-axial collection or mass lesion/mass effect. Prominence of ventricles and sulci reflects mild to moderate cortical volume loss. Cerebellar atrophy is noted. Scattered periventricular white matter change likely reflects small vessel ischemic microangiopathy. The brainstem and fourth ventricle are within normal limits. The basal ganglia are unremarkable in appearance. The cerebral hemispheres demonstrate grossly normal gray-white differentiation. No mass effect or midline shift is seen. Vascular: No hyperdense vessel or unexpected calcification. Skull: There is no evidence of fracture; visualized osseous structures are unremarkable in appearance. Sinuses/Orbits: The orbits are within normal limits. The paranasal sinuses are well-aerated. There is partial opacification of the mastoid air cells bilaterally. Other: No significant soft tissue abnormalities are seen. IMPRESSION: 1. No acute intracranial pathology seen on CT. 2. Mild to moderate cortical volume loss and scattered small vessel ischemic microangiopathy. 3. Partial opacification of the mastoid air cells bilaterally. Electronically Signed   By: Garald Balding M.D.   On: 11/25/2015 06:27   Dg Chest Port 1 View  Result Date: 11/26/2015 CLINICAL DATA:  75 year old female with a history of altered mental status EXAM: PORTABLE  CHEST 1 VIEW COMPARISON:  11/25/2015, 09/21/2015 FINDINGS: Cardiomediastinal silhouette unchanged in size and contour. Calcifications of the aortic arch. Since the prior there has been removal of the event recorder on the chest wall. Stigmata of emphysema, with flattened hemidiaphragms, and hyperinflation. Chronic interstitial opacities persist. Pleural parenchymal thickening at the lung apices. No pleural effusion or confluent airspace disease.  No pneumothorax. No displaced fracture. IMPRESSION: Chronic lung changes and emphysema without evidence of superimposed acute cardiopulmonary disease. Aortic atherosclerosis. Signed, Dulcy Fanny. Earleen Newport, DO [facility] Electronically Signed   By: Corrie Mckusick D.O.   On: 11/26/2015 10:37      ASSESSMENT AND PLAN: 1. Asymptomatic atrial tachycardia: Will switch IV diltiazem to oral 60 mg BID. Continue metoprolol 50 mg BID.  2. Hypokalemia: K level pending today.  3. HTN: Mildly elevated. Starting oral diltiazem.   Kate Sable, M.D., F.A.C.C.

## 2015-12-02 NOTE — Progress Notes (Signed)
Pt is agitated and combative, pulled her PIV out, pt restless and will not stay in bed, safety mitten placed but patient still managed to take it off. K. Schorr NP notified and ordered ativan 0.5mg  IV. Will continue to monitor.

## 2015-12-02 NOTE — Progress Notes (Addendum)
PROGRESS NOTE    Tammy Beard  [id number] DOB: 21-Jan-1941 DOA: 11/26/2015 PCP: Jeanmarie Hubert, MD   Brief Narrative: Tammy Beard is a 75 year old female with history of easy bruising, GERD, osteopenia, polio, polymyalgia rheumatica, rheumatic fever, CVA, ulcers colitis. She presented with altered mental status. Symptoms likely secondary to metabolic encephalopathy from urinary tract infection. She has continued to improve with antibiotic therapy.   Assessment & Plan:   Active Problems:   Dyslipidemia   Essential hypertension   Ulcerative colitis ([place])   Polymyalgia rheumatica (HCC)   Sepsis secondary to UTI (HCC)   GERD (gastroesophageal reflux disease)   Hyponatremia   Sepsis ([place])   Sepsis due to Gram negative bacteria (HCC)   Citrobacter infection   Hypokalemia   Chronic combined systolic and diastolic CHF (congestive heart failure) ([place])   Sepsis secondary to urinary tract infection Pyelonephritis Patient appears to be improving well. She has read afebrile for last 24 hours. Urine culture positive for Citrobacter. Sensitivities available. -continue ciprofloxacin 500mg  BID for total of 14 days abx coverage -Continue IV fluids  Hyponatremia Improved -Continue normal saline IVF  Hypokalemia Hypomagnesemia Potassium normalized -Repeat BMP in the morning -Continue to replete potassium as needed  Atrial Tachycardia Patient has a diagnosis of atrial tachycardia and has been seen by cardiology. Metoprolol increased and restarted on cardizem IV. Rate has been controlled. Anticipate transition to PO cardizem  AKI Resolved  Chronic diastolic CHF Asymptomatic. She is tolerating fluids. Weight has remained stable -Monitor fluid status  Elevated troponin Likely secondary to demand. Patient is asymptomatic. Troponin has remained stable.  GERD -Continue Pepcid  Hypertension Controlled -Continue metoprolol -Continue home Lotensin/hctz -Cardiology titrating  diltiazem for tachycardia  Polymyalgia rheumatica -Continue prednisone 5 mg  Ulcerative colitis -Continue mesalamine 1.2 g daily  History of CVA -Continue atorvastatin  DVT prophylaxis: Heparin subcutaneous Code Status: DO NOT RESUSCITATE  Family Communication: Brother, Laurey Arrow, at bedside and updated with plan Disposition Plan: Plan for discharge tomorrow to SNF   Consultants:  Pharmacy   Procedures:  Echo (9/6)  Antimicrobials:  Cefepime (9/4>>9/7)  Ciprofloxacin (9/8>>   Subjective: Patient states no complaints overnight. Per nursing report, patient had an episode of confusion overnight and patient does not recall any of this.  Objective: Vitals:   12/01/15 2010 12/02/15 0014 12/02/15 0327 12/02/15 0550  BP: (!) 142/66 140/68  129/71  Pulse: 66 61  62  Resp: 18 17  17   Temp: 98.4 F (36.9 C) 98.8 F (37.1 C)  98.1 F (36.7 C)  TempSrc: Oral Oral  Oral  SpO2: 98% 100%  95%  Weight:   55.7 kg (122 lb 14.4 oz)   Height:        Intake/Output Summary (Last 24 hours) at 12/02/15 0904 Last data filed at 12/02/15 [id number]  Gross per 24 hour  Intake          3037.08 ml  Output              300 ml  Net          2737.08 ml   Filed Weights   11/30/15 0617 12/01/15 0544 12/02/15 0327  Weight: 50.2 kg (110 lb 10.7 oz) 51.8 kg (114 lb 3.2 oz) 55.7 kg (122 lb 14.4 oz)    Examination:  General exam: Appears calm and comfortable, Eating breakfast Respiratory system: Clear to auscultation. Respiratory effort normal. Cardiovascular system: S1 & S2 heard, RRR. No murmurs, rubs, gallops or clicks Gastrointestinal system: Abdomen is nondistended, soft  and nontender. No organomegaly or masses felt. Normal bowel sounds heard. Central nervous system: Alert and oriented x3 Extremities: No cyanosis. No lower extremity edema.  Skin: No rashes, lesions or ulcers Psychiatry: Judgement and insight appear normal. Mood & affect appropriate.     Data Reviewed: I have personally  reviewed following labs and imaging studies  CBC:  Recent Labs Lab 11/26/15 1013 11/27/15 0243 12/01/15 0224  WBC 13.3* 11.5* 6.5  NEUTROABS 11.3*  --   --   HGB 13.4 12.0 9.8*  HCT 39.1 36.4 30.9*  MCV 88.1 89.4 92.0  PLT 209 163 [id number]*   Basic Metabolic Panel:  Recent Labs Lab 11/27/15 0243 11/27/15 1435 11/28/15 0849 11/29/15 1149 11/30/15 1941 12/01/15 0224  NA 130*  --  131* 131* 127* 132*  K 2.4* 3.2* 3.3* 3.5 3.3* 3.0*  CL 93*  --  93* 95* 95* 98*  CO2 26  --  27 28 23 27   GLUCOSE 117*  --  171* 177* 229* 102*  BUN 18  --  14 13 14 12   CREATININE 1.02*  --  0.93 0.90 0.97 0.95  CALCIUM 8.3*  --  8.4* 8.2* 8.1* 8.2*  MG 1.6* 1.7 2.6*  --  1.8 1.8   GFR: Estimated Creatinine Clearance: 43.5 mL/min (by C-G formula based on SCr of 0.95 mg/dL). Liver Function Tests:  Recent Labs Lab 11/26/15 1013 11/27/15 0243  AST 21 18  ALT 20 16  ALKPHOS 64 57  BILITOT 0.8 0.9  PROT 7.3 6.1*  ALBUMIN 4.0 3.4*   No results for input(s): LIPASE, AMYLASE in the last 168 hours. No results for input(s): AMMONIA in the last 168 hours. Coagulation Profile:  Recent Labs Lab 11/26/15 1640  INR 1.08   Cardiac Enzymes:  Recent Labs Lab 11/26/15 1013 11/26/15 1640 11/26/15 2300  TROPONINI 0.06* 0.07* 0.07*   BNP (last 3 results) No results for input(s): PROBNP in the last 8760 hours. HbA1C: No results for input(s): HGBA1C in the last 72 hours. CBG: No results for input(s): GLUCAP in the last 168 hours. Lipid Profile: No results for input(s): CHOL, HDL, LDLCALC, TRIG, CHOLHDL, LDLDIRECT in the last 72 hours. Thyroid Function Tests: No results for input(s): TSH, T4TOTAL, FREET4, T3FREE, THYROIDAB in the last 72 hours. Anemia Panel: No results for input(s): VITAMINB12, FOLATE, FERRITIN, TIBC, IRON, RETICCTPCT in the last 72 hours. Sepsis Labs:  Recent Labs Lab 11/26/15 1020 11/26/15 1640 11/26/15 1851  PROCALCITON  --  <0.10  --   LATICACIDVEN 1.46 0.9 0.9      Recent Results (from the past 240 hour(s))  Urine culture     Status: Abnormal   Collection Time: 11/25/15  5:35 AM  Result Value Ref Range Status   Specimen Description URINE, CATHETERIZED  Final   Special Requests NONE  Final   Culture >=100,000 COLONIES/mL CITROBACTER FREUNDII (A)  Final   Report Status 11/27/2015 FINAL  Final   Organism ID, Bacteria CITROBACTER FREUNDII (A)  Final      Susceptibility   Citrobacter freundii - MIC*    CEFAZOLIN >=64 RESISTANT Resistant     CEFTRIAXONE <=1 SENSITIVE Sensitive     CIPROFLOXACIN <=0.25 SENSITIVE Sensitive     GENTAMICIN <=1 SENSITIVE Sensitive     IMIPENEM 1 SENSITIVE Sensitive     NITROFURANTOIN <=16 SENSITIVE Sensitive     TRIMETH/SULFA <=20 SENSITIVE Sensitive     PIP/TAZO <=4 SENSITIVE Sensitive     * >=100,000 COLONIES/mL CITROBACTER FREUNDII  Blood Culture (routine x  2)     Status: None   Collection Time: 11/26/15 10:19 AM  Result Value Ref Range Status   Specimen Description BLOOD RIGHT ANTECUBITAL  Final   Special Requests BOTTLES DRAWN AEROBIC AND ANAEROBIC 5CC  Final   Culture NO GROWTH 5 DAYS  Final   Report Status 12/01/2015 FINAL  Final  Urine culture     Status: None   Collection Time: 11/26/15 10:21 AM  Result Value Ref Range Status   Specimen Description URINE, CATHETERIZED  Final   Special Requests NONE  Final   Culture NO GROWTH  Final   Report Status 11/27/2015 FINAL  Final  Blood Culture (routine x 2)     Status: None   Collection Time: 11/26/15 10:45 AM  Result Value Ref Range Status   Specimen Description BLOOD RIGHT FOREARM  Final   Special Requests BOTTLES DRAWN AEROBIC AND ANAEROBIC 2.5CC  Final   Culture NO GROWTH 5 DAYS  Final   Report Status 12/01/2015 FINAL  Final  MRSA PCR Screening     Status: None   Collection Time: 11/26/15  2:40 PM  Result Value Ref Range Status   MRSA by PCR NEGATIVE NEGATIVE Final    Comment:        The GeneXpert MRSA Assay (FDA approved for NASAL  specimens only), is one component of a comprehensive MRSA colonization surveillance program. It is not intended to diagnose MRSA infection nor to guide or monitor treatment for MRSA infections.          Radiology Studies: No results found.      Scheduled Meds: . atorvastatin  10 mg Oral q1800  . benazepril  20 mg Oral Daily   And  . hydrochlorothiazide  12.5 mg Oral Daily  . ciprofloxacin  500 mg Oral BID  . clopidogrel  75 mg Oral Daily  . famotidine  20 mg Oral Daily  . heparin  5,000 Units Subcutaneous Q8H  . mesalamine  2.4 g Oral Q breakfast  . metoprolol tartrate  50 mg Oral BID  . predniSONE  10 mg Oral Q breakfast  . senna  2 tablet Oral QHS  . sodium chloride flush  3 mL Intravenous Q12H   Continuous Infusions: . sodium chloride 100 mL/hr at 12/02/15 0832  . diltiazem (CARDIZEM) infusion 5 mg/hr (12/02/15 0245)     LOS: 6 days     Cordelia Poche, MD [facility] 12/02/2015, 9:04 AM Pager: [phone]  If 7PM-7AM, please contact night-coverage [email or web address] Password [email or web address] 12/02/2015, 9:04 AM

## 2015-12-03 DIAGNOSIS — R41841 Cognitive communication deficit: Secondary | ICD-10-CM | POA: Diagnosis not present

## 2015-12-03 DIAGNOSIS — I639 Cerebral infarction, unspecified: Secondary | ICD-10-CM | POA: Diagnosis not present

## 2015-12-03 DIAGNOSIS — R42 Dizziness and giddiness: Secondary | ICD-10-CM | POA: Diagnosis not present

## 2015-12-03 DIAGNOSIS — E785 Hyperlipidemia, unspecified: Secondary | ICD-10-CM | POA: Diagnosis not present

## 2015-12-03 DIAGNOSIS — E860 Dehydration: Secondary | ICD-10-CM | POA: Diagnosis not present

## 2015-12-03 DIAGNOSIS — I63522 Cerebral infarction due to unspecified occlusion or stenosis of left anterior cerebral artery: Secondary | ICD-10-CM | POA: Diagnosis not present

## 2015-12-03 DIAGNOSIS — I5042 Chronic combined systolic (congestive) and diastolic (congestive) heart failure: Secondary | ICD-10-CM | POA: Diagnosis not present

## 2015-12-03 DIAGNOSIS — H539 Unspecified visual disturbance: Secondary | ICD-10-CM | POA: Diagnosis not present

## 2015-12-03 DIAGNOSIS — N179 Acute kidney failure, unspecified: Secondary | ICD-10-CM | POA: Diagnosis not present

## 2015-12-03 DIAGNOSIS — M353 Polymyalgia rheumatica: Secondary | ICD-10-CM | POA: Diagnosis not present

## 2015-12-03 DIAGNOSIS — I10 Essential (primary) hypertension: Secondary | ICD-10-CM | POA: Diagnosis not present

## 2015-12-03 DIAGNOSIS — I69398 Other sequelae of cerebral infarction: Secondary | ICD-10-CM | POA: Diagnosis not present

## 2015-12-03 DIAGNOSIS — I471 Supraventricular tachycardia: Secondary | ICD-10-CM | POA: Diagnosis not present

## 2015-12-03 DIAGNOSIS — K5100 Ulcerative (chronic) pancolitis without complications: Secondary | ICD-10-CM | POA: Diagnosis not present

## 2015-12-03 DIAGNOSIS — R350 Frequency of micturition: Secondary | ICD-10-CM | POA: Diagnosis not present

## 2015-12-03 DIAGNOSIS — M25511 Pain in right shoulder: Secondary | ICD-10-CM | POA: Diagnosis not present

## 2015-12-03 DIAGNOSIS — I6300 Cerebral infarction due to thrombosis of unspecified precerebral artery: Secondary | ICD-10-CM | POA: Diagnosis not present

## 2015-12-03 DIAGNOSIS — A419 Sepsis, unspecified organism: Secondary | ICD-10-CM | POA: Diagnosis not present

## 2015-12-03 DIAGNOSIS — R1312 Dysphagia, oropharyngeal phase: Secondary | ICD-10-CM | POA: Diagnosis not present

## 2015-12-03 DIAGNOSIS — E876 Hypokalemia: Secondary | ICD-10-CM | POA: Diagnosis not present

## 2015-12-03 DIAGNOSIS — M6281 Muscle weakness (generalized): Secondary | ICD-10-CM | POA: Diagnosis not present

## 2015-12-03 DIAGNOSIS — N390 Urinary tract infection, site not specified: Secondary | ICD-10-CM | POA: Diagnosis not present

## 2015-12-03 DIAGNOSIS — I63233 Cerebral infarction due to unspecified occlusion or stenosis of bilateral carotid arteries: Secondary | ICD-10-CM | POA: Diagnosis not present

## 2015-12-03 DIAGNOSIS — K219 Gastro-esophageal reflux disease without esophagitis: Secondary | ICD-10-CM | POA: Diagnosis not present

## 2015-12-03 DIAGNOSIS — R278 Other lack of coordination: Secondary | ICD-10-CM | POA: Diagnosis not present

## 2015-12-03 DIAGNOSIS — I69998 Other sequelae following unspecified cerebrovascular disease: Secondary | ICD-10-CM | POA: Diagnosis not present

## 2015-12-03 DIAGNOSIS — A498 Other bacterial infections of unspecified site: Secondary | ICD-10-CM | POA: Diagnosis not present

## 2015-12-03 DIAGNOSIS — R2681 Unsteadiness on feet: Secondary | ICD-10-CM | POA: Diagnosis not present

## 2015-12-03 DIAGNOSIS — E871 Hypo-osmolality and hyponatremia: Secondary | ICD-10-CM | POA: Diagnosis not present

## 2015-12-03 DIAGNOSIS — K5130 Ulcerative (chronic) rectosigmoiditis without complications: Secondary | ICD-10-CM | POA: Diagnosis not present

## 2015-12-03 DIAGNOSIS — R269 Unspecified abnormalities of gait and mobility: Secondary | ICD-10-CM | POA: Diagnosis not present

## 2015-12-03 MED ORDER — METOPROLOL TARTRATE 50 MG PO TABS: 50.0000 mg | ORAL_TABLET | Freq: Two times a day (BID) | ORAL | Status: DC

## 2015-12-03 MED ORDER — DILTIAZEM HCL 60 MG PO TABS: 60.0000 mg | ORAL_TABLET | Freq: Two times a day (BID) | ORAL | Status: DC

## 2015-12-03 MED ORDER — CEFPODOXIME PROXETIL 200 MG PO TABS: 200.0000 mg | ORAL_TABLET | Freq: Two times a day (BID) | ORAL | Status: AC

## 2015-12-03 NOTE — Clinical Social Work Note (Signed)
Clinical Social Worker facilitated patient discharge including contacting patient family and facility to confirm patient discharge plans.  Clinical information faxed to facility and family agreeable with plan.  CSW arranged transport via patient sister to [facility].  RN to call report prior to discharge.  Clinical Social Worker will sign off for now as social work intervention is no longer needed. Please consult [place] again if new need arises.  Barbette Or, [place]

## 2015-12-03 NOTE — Progress Notes (Signed)
Pt d/c off floor at 1622.  Notified by SW d/c info sent to [facility].  Pt and her sister informed.  Karie Kirks, RN

## 2015-12-03 NOTE — Discharge Summary (Addendum)
Physician Discharge Summary  Tammy Beard [id number] DOB: [birth year] DOA: 11/26/2015  PCP: Jeanmarie Hubert, MD  Admit date: 11/26/2015 Discharge date: 12/03/2015  Admitted From: ALF Disposition:  SNF  Recommendations for Outpatient Follow-up:  1. Follow up with PCP in 1 week 2. Please obtain BMP to recheck potassium 3. Restart event monitor per cardiology recommendations 4. Urology follow-up for recurrent UTIs  Home Health: No Equipment/Devices: No  Discharge Condition: Stable CODE STATUS: DNR Diet recommendation: Heart Healthy  Brief/Interim Summary: Tammy Beard is a 75 year old female with history of easy bruising, GERD, osteopenia, polio, polymyalgia rheumatica, rheumatic fever, CVA, ulcers colitis. She presented with altered mental status. Symptoms likely secondary to metabolic encephalopathy from urinary tract infection. She has continued to improve with antibiotic therapy.  Discharge Diagnoses:  Active Problems:   Dyslipidemia   Essential hypertension   Ulcerative colitis ([place])   Polymyalgia rheumatica (HCC)   Sepsis secondary to UTI (HCC)   GERD (gastroesophageal reflux disease)   Hyponatremia   Sepsis ([place])   Sepsis due to Gram negative bacteria (HCC)   Citrobacter infection   Hypokalemia   Chronic combined systolic and diastolic CHF (congestive heart failure) (HCC)  Sepsis secondary to urinary tract infection Pyelonephritis Patient was treated with cefepime empirically. Cultures came back with evidence of citrobacter freundii. She was initially positioned to ciprofloxacin 500 mg twice a day. Over the weekend she developed some behavioral changes and night. Will discharge her on Vantin instead of this is less likely to cause behavioral changes. Total course of 14 days of antibiotics. Last dose should be on 12/09/2015.   Hyponatremia Improved with IV fluids  Hypokalemia Hypomagnesemia Electrolytes replaced  Atrial Tachycardia Patient has a diagnosis  of atrial tachycardia and has been seen by cardiology. Metoprolol increased and restarted on cardizem IV. Cardizem was eventually transitioned to Cardizem 60 mg by mouth. Patient was rate controlled on this dose. Throughout these events, patient was asymptomatic.  AKI Resolved with IV fluids  Chronic diastolic CHF Asymptomatic.  Elevated troponin Likely secondary to demand. Patient is asymptomatic. Troponin has remained stable. No chest pain  GERD Continue Pepcid  Hypertension Controlled on metoprolol, Lotensin/HCTZ, Cardizem   Polymyalgia rheumatica Continue prednisone 5 mg  Ulcerative colitis Continue mesalamine 1.2 g daily  History of CVA Continue atorvastatin   Discharge Instructions  Discharge Instructions    Diet - low sodium heart healthy    Complete by:  As directed   Increase activity slowly    Complete by:  As directed       Medication List    TAKE these medications   acetaminophen 325 MG tablet Commonly known as:  TYLENOL Take 650 mg by mouth every 6 (six) hours as needed.   ARTIFICIAL TEAR SOLUTION OP Apply 1 drop to eye daily as needed.   atorvastatin 10 MG tablet Commonly known as:  LIPITOR Take 1 tablet (10 mg total) by mouth daily at 6 PM.   benazepril-hydrochlorthiazide 20-12.5 MG tablet Commonly known as:  LOTENSIN HCT Take 0.5 tablets by mouth daily.   CALCIUM 600+D PLUS MINERALS 600-400 MG-UNIT Tabs Take 2 tablets by mouth 2 (two) times daily.   cefpodoxime 200 MG tablet Commonly known as:  VANTIN Take 1 tablet (200 mg total) by mouth 2 (two) times daily.   clopidogrel 75 MG tablet Commonly known as:  PLAVIX Take 1 tablet (75 mg total) by mouth daily.   diltiazem 60 MG tablet Commonly known as:  CARDIZEM Take 1 tablet (60 mg total) by  mouth every 12 (twelve) hours.   dimenhyDRINATE 50 MG tablet Commonly known as:  DRAMAMINE Take 1 tablet every 6 hours as needed for nausea or dizziness   fluticasone 50 MCG/ACT nasal  spray Commonly known as:  FLONASE Place 1 spray into both nostrils daily as needed for allergies or rhinitis. Reported on 09/28/2015   mesalamine 1.2 g EC tablet Commonly known as:  LIALDA take 2 tablets by mouth daily with BREAKFAST   metoprolol 50 MG tablet Commonly known as:  LOPRESSOR Take 1 tablet (50 mg total) by mouth 2 (two) times daily. What changed:  medication strength  how much to take   multivitamin tablet Take 1 tablet by mouth daily.   predniSONE 5 MG tablet Commonly known as:  DELTASONE TWO TABLETs DAILY TO TREAT POLYMYALGIA RHEUMATICA   ranitidine 150 MG tablet Commonly known as:  ZANTAC Take 150 mg by mouth at bedtime. Take one tablet at bedtime   sennosides-docusate sodium 8.6-50 MG tablet Commonly known as:  SENOKOT-S Take 1 tablet by mouth 3 (three) times daily. Take one tablet three times daily for constipation-hold for loose stools      [facility] Schedule an appointment as soon as possible for a visit in 2 week(s).   Why:  Recurrent UTIs Contact information: [place] [address] [phone]        [facility]OLOGY .          No Known Allergies  Consultations:  Cardiology/Electrophysiology   Procedures/Studies: Dg Chest 2 View  Result Date: 11/25/2015 CLINICAL DATA:  Acute onset of generalized weakness. Initial encounter. EXAM: CHEST  2 VIEW COMPARISON:  Chest radiograph performed 09/21/2015 FINDINGS: The lungs are hyperexpanded, with flattening of the hemidiaphragms, compatible with COPD. Mild left basilar atelectasis is noted. There is no evidence of pleural effusion or pneumothorax. The heart is borderline enlarged. No acute osseous abnormalities are seen. Postoperative change is noted overlying the mediastinum. IMPRESSION: Mild left basilar atelectasis noted. Borderline cardiomegaly. Findings of COPD. Electronically Signed   By: Garald Balding M.D.   On: 11/25/2015 06:12    Ct Head Wo Contrast  Result Date: 11/26/2015 CLINICAL DATA:  75 year old female with altered mental status. EXAM: CT HEAD WITHOUT CONTRAST TECHNIQUE: Contiguous axial images were obtained from the base of the skull through the vertex without intravenous contrast. COMPARISON:  11/25/2015 and prior CTs dating back to 09/21/2015 FINDINGS: Brain: No evidence of acute infarction, hemorrhage, hydrocephalus, extra-axial collection or mass lesion/mass effect. Atrophy and chronic small-vessel white matter ischemic changes again identified. Vascular: Intracranial atherosclerotic calcifications noted. Skull: Unremarkable Sinuses/Orbits: Visualized portions unremarkable. Other: None IMPRESSION: No evidence of acute intracranial abnormality. Atrophy and chronic small-vessel white matter ischemic changes. Electronically Signed   By: Margarette Canada M.D.   On: 11/26/2015 11:25   Ct Head Wo Contrast  Result Date: 11/25/2015 CLINICAL DATA:  Acute onset of generalized weakness and dizziness. Initial encounter. EXAM: CT HEAD WITHOUT CONTRAST TECHNIQUE: Contiguous axial images were obtained from the base of the skull through the vertex without intravenous contrast. COMPARISON:  CTA of the head performed 09/24/2015, and MRA of the brain performed 09/22/2015 FINDINGS: Brain: No evidence of acute infarction, hemorrhage, hydrocephalus, extra-axial collection or mass lesion/mass effect. Prominence of ventricles and sulci reflects mild to moderate cortical volume loss. Cerebellar atrophy is noted. Scattered periventricular white matter change likely reflects small vessel ischemic microangiopathy. The brainstem and fourth ventricle are within normal limits. The basal  ganglia are unremarkable in appearance. The cerebral hemispheres demonstrate grossly normal gray-white differentiation. No mass effect or midline shift is seen. Vascular: No hyperdense vessel or unexpected calcification. Skull: There is no evidence of fracture; visualized  osseous structures are unremarkable in appearance. Sinuses/Orbits: The orbits are within normal limits. The paranasal sinuses are well-aerated. There is partial opacification of the mastoid air cells bilaterally. Other: No significant soft tissue abnormalities are seen. IMPRESSION: 1. No acute intracranial pathology seen on CT. 2. Mild to moderate cortical volume loss and scattered small vessel ischemic microangiopathy. 3. Partial opacification of the mastoid air cells bilaterally. Electronically Signed   By: Garald Balding M.D.   On: 11/25/2015 06:27   Dg Chest Port 1 View  Result Date: 11/26/2015 CLINICAL DATA:  75 year old female with a history of altered mental status EXAM: PORTABLE CHEST 1 VIEW COMPARISON:  11/25/2015, 09/21/2015 FINDINGS: Cardiomediastinal silhouette unchanged in size and contour. Calcifications of the aortic arch. Since the prior there has been removal of the event recorder on the chest wall. Stigmata of emphysema, with flattened hemidiaphragms, and hyperinflation. Chronic interstitial opacities persist. Pleural parenchymal thickening at the lung apices. No pleural effusion or confluent airspace disease.  No pneumothorax. No displaced fracture. IMPRESSION: Chronic lung changes and emphysema without evidence of superimposed acute cardiopulmonary disease. Aortic atherosclerosis. Signed, Dulcy Fanny. Earleen Newport, DO [facility] Electronically Signed   By: Corrie Mckusick D.O.   On: 11/26/2015 10:37    Echocardiogram 11/28/2015  Study Conclusions  - Left ventricle: The cavity size was normal. There was mild focal   basal hypertrophy of the septum. Systolic function was normal.   The estimated ejection fraction was in the range of 60% to 65%.   Wall motion was normal; there were no regional wall motion   abnormalities. The study is not technically sufficient to allow   evaluation of LV diastolic function. - Aortic valve: Moderately  calcified annulus. Trileaflet; mildly   calcified leaflets. There was trivial regurgitation. - Mitral valve: Calcified annulus. - Right atrium: The atrium was mildly dilated. - Atrial septum: There was increased thickness of the septum,   consistent with lipomatous hypertrophy.  Subjective: She reports no events overnight. She feels fine. No chest pain or dyspnea. No palpitations.  Discharge Exam: Vitals:   12/03/15 1037 12/03/15 1209  BP:  (!) 120/55  Pulse: 82 (!) 57  Resp:  18  Temp:  98.2 F (36.8 C)   Vitals:   12/03/15 0721 12/03/15 1036 12/03/15 1037 12/03/15 1209  BP: 140/61 (!) 160/89  (!) 120/55  Pulse: 74  82 (!) 57  Resp: 18   18  Temp: 98.1 F (36.7 C)   98.2 F (36.8 C)  TempSrc: Oral   Oral  SpO2: 95%   98%  Weight: 55.3 kg (121 lb 14.4 oz)     Height:        General: Pt is alert, awake, not in acute distress Cardiovascular: RRR, S1/S2 +, no rubs, no gallops Respiratory: CTA bilaterally, no wheezing, no rhonchi Abdominal: Soft, NT, ND, bowel sounds + Extremities: no edema, no cyanosis. 4/5 strength of LUE compared to RUE. 5/5 bilateral lower extremity. Neurological: Alert, oriented x3, CN II-VII and IX-XII intact. VIII diminished. Reflexes equal bilaterally. No dysarthria.    The results of significant diagnostics from this hospitalization (including imaging, microbiology, ancillary and laboratory) are listed below for reference.     Microbiology: Recent Results (from the past 240 hour(s))  Urine culture  Status: Abnormal   Collection Time: 11/25/15  5:35 AM  Result Value Ref Range Status   Specimen Description URINE, CATHETERIZED  Final   Special Requests NONE  Final   Culture >=100,000 COLONIES/mL CITROBACTER FREUNDII (A)  Final   Report Status 11/27/2015 FINAL  Final   Organism ID, Bacteria CITROBACTER FREUNDII (A)  Final      Susceptibility   Citrobacter freundii - MIC*    CEFAZOLIN >=64 RESISTANT Resistant     CEFTRIAXONE <=1 SENSITIVE  Sensitive     CIPROFLOXACIN <=0.25 SENSITIVE Sensitive     GENTAMICIN <=1 SENSITIVE Sensitive     IMIPENEM 1 SENSITIVE Sensitive     NITROFURANTOIN <=16 SENSITIVE Sensitive     TRIMETH/SULFA <=20 SENSITIVE Sensitive     PIP/TAZO <=4 SENSITIVE Sensitive     * >=100,000 COLONIES/mL CITROBACTER FREUNDII  Blood Culture (routine x 2)     Status: None   Collection Time: 11/26/15 10:19 AM  Result Value Ref Range Status   Specimen Description BLOOD RIGHT ANTECUBITAL  Final   Special Requests BOTTLES DRAWN AEROBIC AND ANAEROBIC 5CC  Final   Culture NO GROWTH 5 DAYS  Final   Report Status 12/01/2015 FINAL  Final  Urine culture     Status: None   Collection Time: 11/26/15 10:21 AM  Result Value Ref Range Status   Specimen Description URINE, CATHETERIZED  Final   Special Requests NONE  Final   Culture NO GROWTH  Final   Report Status 11/27/2015 FINAL  Final  Blood Culture (routine x 2)     Status: None   Collection Time: 11/26/15 10:45 AM  Result Value Ref Range Status   Specimen Description BLOOD RIGHT FOREARM  Final   Special Requests BOTTLES DRAWN AEROBIC AND ANAEROBIC 2.5CC  Final   Culture NO GROWTH 5 DAYS  Final   Report Status 12/01/2015 FINAL  Final  MRSA PCR Screening     Status: None   Collection Time: 11/26/15  2:40 PM  Result Value Ref Range Status   MRSA by PCR NEGATIVE NEGATIVE Final    Comment:        The GeneXpert MRSA Assay (FDA approved for NASAL specimens only), is one component of a comprehensive MRSA colonization surveillance program. It is not intended to diagnose MRSA infection nor to guide or monitor treatment for MRSA infections.      Labs: BNP (last 3 results) No results for input(s): BNP in the last 8760 hours. Basic Metabolic Panel:  Recent Labs Lab 11/27/15 0243 11/27/15 1435 11/28/15 0849 11/29/15 1149 11/30/15 1941 12/01/15 0224 12/02/15 1113  NA 130*  --  131* 131* 127* 132* 134*  K 2.4* 3.2* 3.3* 3.5 3.3* 3.0* 4.1  CL 93*  --  93* 95*  95* 98* 102  CO2 26  --  27 28 23 27 27   GLUCOSE 117*  --  171* 177* 229* 102* 114*  BUN 18  --  14 13 14 12 12   CREATININE 1.02*  --  0.93 0.90 0.97 0.95 1.08*  CALCIUM 8.3*  --  8.4* 8.2* 8.1* 8.2* 8.6*  MG 1.6* 1.7 2.6*  --  1.8 1.8  --    Liver Function Tests:  Recent Labs Lab 11/27/15 0243  AST 18  ALT 16  ALKPHOS 57  BILITOT 0.9  PROT 6.1*  ALBUMIN 3.4*   No results for input(s): LIPASE, AMYLASE in the last 168 hours. No results for input(s): AMMONIA in the last 168 hours. CBC:  Recent Labs Lab  11/27/15 0243 12/01/15 0224 12/02/15 1113  WBC 11.5* 6.5 6.9  HGB 12.0 9.8* 10.8*  HCT 36.4 30.9* 34.8*  MCV 89.4 92.0 94.1  PLT 163 136* 150   Cardiac Enzymes:  Recent Labs Lab 11/26/15 1640 11/26/15 2300  TROPONINI 0.07* 0.07*   BNP: Invalid input(s): POCBNP CBG: No results for input(s): GLUCAP in the last 168 hours. D-Dimer No results for input(s): DDIMER in the last 72 hours. Hgb A1c No results for input(s): HGBA1C in the last 72 hours. Lipid Profile No results for input(s): CHOL, HDL, LDLCALC, TRIG, CHOLHDL, LDLDIRECT in the last 72 hours. Thyroid function studies No results for input(s): TSH, T4TOTAL, T3FREE, THYROIDAB in the last 72 hours.  Invalid input(s): FREET3 Anemia work up No results for input(s): VITAMINB12, FOLATE, FERRITIN, TIBC, IRON, RETICCTPCT in the last 72 hours. Urinalysis    Component Value Date/Time   COLORURINE YELLOW 11/26/2015 1021   APPEARANCEUR CLEAR 11/26/2015 1021   LABSPEC 1.024 11/26/2015 1021   PHURINE 6.0 11/26/2015 1021   GLUCOSEU NEGATIVE 11/26/2015 1021   HGBUR SMALL (A) 11/26/2015 1021   HGBUR negative 09/06/2009 1459   BILIRUBINUR NEGATIVE 11/26/2015 1021   BILIRUBINUR NEG 08/23/2010 1440   KETONESUR 15 (A) 11/26/2015 1021   PROTEINUR 100 (A) 11/26/2015 1021   UROBILINOGEN 0.2 08/23/2010 1440   UROBILINOGEN 0.2 09/06/2009 1459   NITRITE NEGATIVE 11/26/2015 1021   LEUKOCYTESUR NEGATIVE 11/26/2015 1021    Sepsis Labs Invalid input(s): PROCALCITONIN,  WBC,  LACTICIDVEN Microbiology Recent Results (from the past 240 hour(s))  Urine culture     Status: Abnormal   Collection Time: 11/25/15  5:35 AM  Result Value Ref Range Status   Specimen Description URINE, CATHETERIZED  Final   Special Requests NONE  Final   Culture >=100,000 COLONIES/mL CITROBACTER FREUNDII (A)  Final   Report Status 11/27/2015 FINAL  Final   Organism ID, Bacteria CITROBACTER FREUNDII (A)  Final      Susceptibility   Citrobacter freundii - MIC*    CEFAZOLIN >=64 RESISTANT Resistant     CEFTRIAXONE <=1 SENSITIVE Sensitive     CIPROFLOXACIN <=0.25 SENSITIVE Sensitive     GENTAMICIN <=1 SENSITIVE Sensitive     IMIPENEM 1 SENSITIVE Sensitive     NITROFURANTOIN <=16 SENSITIVE Sensitive     TRIMETH/SULFA <=20 SENSITIVE Sensitive     PIP/TAZO <=4 SENSITIVE Sensitive     * >=100,000 COLONIES/mL CITROBACTER FREUNDII  Blood Culture (routine x 2)     Status: None   Collection Time: 11/26/15 10:19 AM  Result Value Ref Range Status   Specimen Description BLOOD RIGHT ANTECUBITAL  Final   Special Requests BOTTLES DRAWN AEROBIC AND ANAEROBIC 5CC  Final   Culture NO GROWTH 5 DAYS  Final   Report Status 12/01/2015 FINAL  Final  Urine culture     Status: None   Collection Time: 11/26/15 10:21 AM  Result Value Ref Range Status   Specimen Description URINE, CATHETERIZED  Final   Special Requests NONE  Final   Culture NO GROWTH  Final   Report Status 11/27/2015 FINAL  Final  Blood Culture (routine x 2)     Status: None   Collection Time: 11/26/15 10:45 AM  Result Value Ref Range Status   Specimen Description BLOOD RIGHT FOREARM  Final   Special Requests BOTTLES DRAWN AEROBIC AND ANAEROBIC 2.5CC  Final   Culture NO GROWTH 5 DAYS  Final   Report Status 12/01/2015 FINAL  Final  MRSA PCR Screening     Status: None  Collection Time: 11/26/15  2:40 PM  Result Value Ref Range Status   MRSA by PCR NEGATIVE NEGATIVE Final     Comment:        The GeneXpert MRSA Assay (FDA approved for NASAL specimens only), is one component of a comprehensive MRSA colonization surveillance program. It is not intended to diagnose MRSA infection nor to guide or monitor treatment for MRSA infections.      Time coordinating discharge: Over 30 minutes  SIGNED:   Cordelia Poche, MD  [facility] 12/03/2015, 3:23 PM Pager [phone]  If 7PM-7AM, please contact night-coverage [email or web address] Password [email or web address]  Addendum Patient's brief history initially mentioned a diagnosis of HIV. There is no record of HIV infection in this patient. This was likely secondary to dictation error.  Cordelia Poche, MD [facility] 12/03/2015, 4:38 PM Pager: [phone]

## 2015-12-03 NOTE — Progress Notes (Signed)
Physical Therapy Treatment Patient Details Name: Tammy Beard MRN: [id number] DOB: 14-Oct-1940 Today's Date: 12/03/2015    History of Present Illness EMEREE TRUNZO is a 75 year old female with history of easy bruising, GERD, HIV, osteopenia, polio, polymyalgia rheumatica, rheumatic fever, CVA, ulcers colitis. She presented with altered mental status. Symptoms likely secondary to metabolic encephalopathy from urinary tract infection.    PT Comments    Pt with c/o LUE heaviness. Lag noted with mobility initiation as well as strength deficits.  Follow Up Recommendations  SNF     Equipment Recommendations  None recommended by PT    Recommendations for Other Services       Precautions / Restrictions Precautions Precautions: Fall    Mobility  Bed Mobility               General bed mobility comments: Pt up in recliner.  Transfers   Equipment used: Conservation officer, nature (2 wheeled) Transfers: Sit to/from [employer] to Stand: Mod assist Stand pivot transfers: Mod assist       General transfer comment: RW using for repititions of sit to stand. SPT performed with bilat therapist forearm support and not RW.  Ambulation/Gait             General Gait Details: Unable due to fatigue and weakness.   Stairs            Wheelchair Mobility    Modified Rankin (Stroke Patients Only)       Balance   Sitting-balance support: Feet supported;Single extremity supported Sitting balance-Leahy Scale: Fair     Standing balance support: Bilateral upper extremity supported;During functional activity Standing balance-Leahy Scale: Zero                      Cognition Arousal/Alertness: Awake/alert Behavior During Therapy: WFL for tasks assessed/performed Overall Cognitive Status: History of cognitive impairments - at baseline       Memory: Decreased short-term memory              Exercises General Exercises - Lower  Extremity Ankle Circles/Pumps: AROM;Both;10 reps;Seated Long Arc Quad: AROM;Right;Left;10 reps;Seated Hip Flexion/Marching: AROM;Right;Left;10 reps;Standing Mini-Sqauts: Both;5 reps;Standing;AAROM    General Comments        Pertinent Vitals/Pain Pain Assessment: No/denies pain    Home Living                      Prior Function            PT Goals (current goals can now be found in the care plan section) Acute Rehab PT Goals Patient Stated Goal: walk again PT Goal Formulation: Patient unable to participate in goal setting Time For Goal Achievement: 12/13/15 Potential to Achieve Goals: Fair Progress towards PT goals: Progressing toward goals    Frequency  Min 2X/week    PT Plan Current plan remains appropriate    Co-evaluation             End of Session Equipment Utilized During Treatment: Gait belt Activity Tolerance: Patient limited by fatigue Patient left: in chair;with family/visitor present;with call bell/phone within reach;with chair alarm set     Time: [id number] PT Time Calculation (min) (ACUTE ONLY): 27 min  Charges:  $Therapeutic Exercise: 8-22 mins $Therapeutic Activity: 8-22 mins                    G Codes:      Lorriane Shire 12/03/2015, 11:50 AM

## 2015-12-03 NOTE — Progress Notes (Signed)
Report called to [facility]suing at [facility].  Also informed pt's sister who transported her and [place] caring for pt, that pt forgot her DNR form and that Denyse Amass the [place]. Has it and will send it ASAP.  Verbalized understanding.  Karie Kirks, Therapist, sports.

## 2015-12-03 NOTE — Care Management Note (Signed)
Case Management Note  Patient Details  Name: Tammy Beard MRN: [id number] Date of Birth: 29-Oct-1940  Subjective/Objective:        CM following for progression and d/c planning.             Action/Plan: 12/03/2015 Pt for d/c today to SNF. No CM needs identified.   Expected Discharge Date:    12/03/2015              Expected Discharge Plan:  Skilled Nursing Facility  In-House Referral:  Clinical Social Work  Discharge planning Services  CM Consult  Post Acute Care Choice:  NA Choice offered to:  NA  DME Arranged:  N/A DME Agency:  NA  HH Arranged:  NA HH Agency:  NA  Status of Service:  Completed, signed off  If discussed at [employer] of Stay Meetings, dates discussed:    Additional Comments:  Adron Bene, RN 12/03/2015, 11:50 AM

## 2015-12-03 NOTE — Care Management Important Message (Signed)
Important Message  Patient Details  Name: Tammy Beard MRN: [id number] Date of Birth: [birth year]   Medicare Important Message Given:  Yes    [name] [address] 12/03/2015, 2:52 PM

## 2015-12-04 ENCOUNTER — Non-Acute Institutional Stay (SKILLED_NURSING_FACILITY): Payer: Medicare Other | Admitting: Internal Medicine

## 2015-12-04 ENCOUNTER — Encounter: Payer: Self-pay | Admitting: Internal Medicine

## 2015-12-04 ENCOUNTER — Telehealth: Payer: Self-pay | Admitting: Cardiovascular Disease

## 2015-12-04 DIAGNOSIS — A498 Other bacterial infections of unspecified site: Secondary | ICD-10-CM

## 2015-12-04 DIAGNOSIS — M353 Polymyalgia rheumatica: Secondary | ICD-10-CM

## 2015-12-04 DIAGNOSIS — I6300 Cerebral infarction due to thrombosis of unspecified precerebral artery: Secondary | ICD-10-CM

## 2015-12-04 DIAGNOSIS — I10 Essential (primary) hypertension: Secondary | ICD-10-CM

## 2015-12-04 DIAGNOSIS — R269 Unspecified abnormalities of gait and mobility: Secondary | ICD-10-CM | POA: Diagnosis not present

## 2015-12-04 NOTE — Progress Notes (Signed)
History and Physical    Provider: Jeanmarie Hubert, MD Location:   [place] Room Number: 2 Place of Service:  SNF ([phone])  PCP: Jeanmarie Hubert, MD Patient Care Team: [name], MD as PCP - General (Internal Medicine) [name], MD as Consulting Physician (Cardiology) [name], MD as Consulting Physician (Ophthalmology) [name], MD as Consulting Physician (Gastroenterology) [name], MD as Consulting Physician (Dermatology)  Extended Emergency Contact Information Primary Emergency Contact: [name]) Address: [place], [place] [address] [name] of [place] Phone: [phone] Mobile Phone: [phone] Relation: Brother Secondary Emergency Contact: [name] [place] Phone: [phone] Relation: Sister  Code Status: DNR Goals of Care: Advanced Directive information Advanced Directives 12/04/2015  Does patient have an advance directive? Yes  Type of Paramedic of [place];Living will;Out of facility DNR (pink MOST or yellow form)  Does patient want to make changes to advanced directive? No - Patient declined  Copy of advanced directive(s) in chart? Yes      Chief Complaint  Patient presents with  . Readmit To SNF    HPI: Patient is a 75 y.o. female seen today for readmission to [facility] SNF following hospitalization from 11/26/15  to 12/03/15. She presented to ER with altered mental status. Following evaluation, she wwas determined to have sepsis secondary to UTI. She improved with antibiotics.       Recent illness has weakened her and her gait is unstable. She is admitted for rehabilitation and stabilization.                Past Medical History:  Diagnosis Date  . Colitis   . Ecchymosis 11/15/2015  . GERD (gastroesophageal reflux disease)   . Heart murmur   . Hyperlipidemia   . Hypertension   . Osteopenia    s/p 7 yrs  Fosamax  . Polio   . Polymyalgia ([place])   . Rheumatic fever   . Sleep disorder   . Stroke ([place])   . Ulcerative colitis    Past Surgical History:  Procedure Laterality Date  . BREAST LUMPECTOMY  1986   left; benign  . CATARACT EXTRACTION     Left eye   . COLONOSCOPY  2006  . Colonosopy  07/05/13   Dr. Deatra Ina  . DILATION AND CURETTAGE OF UTERUS  12/1999  . MELANOMA EXCISION  2007   left arm  . NECK SURGERY  1986   Benign growth on neck removed  . TONSILLECTOMY      reports that she has never smoked. She has never used smokeless tobacco. She reports that she drinks alcohol. She reports that she does not use drugs. Social History   Social History  . Marital status: Single    Spouse name: N/A  . Number of children: N/A  . Years of education: N/A   Occupational History  . retired    Social History Main Topics  . Smoking status: Never Smoker  . Smokeless tobacco: Never Used  . Alcohol use Yes     Comment: occasional  . Drug use: No  . Sexual activity: No   Other Topics Concern  . Not on file   Social History Narrative   Daily Caffeine use- soda (diet)      Do you drink/ eat things with caffeine? 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    No                             Living Will, POA, DNR, MOST    Functional Status Survey:    Family History  Problem Relation Age of Onset  . Pancreatic cancer Other   . Other Mother     Coad/ Bronchiectosis  . Diabetes Paternal Grandmother   . Heart failure Paternal Grandmother   . Cancer Paternal Aunt     unaware of primary site  . Coronary artery disease Neg Hx   . Colon cancer Neg Hx   . Rectal cancer Neg Hx   . Stomach cancer Neg Hx   . Heart attack Neg Hx      Health Maintenance  Topic Date Due  . ZOSTAVAX  11/19/1999  . MAMMOGRAM  07/18/2015  . INFLUENZA VACCINE  10/23/2015  . TETANUS/TDAP  12/21/2021  . DEXA SCAN  Completed  . PNA vac Low Risk Adult  Completed    No Known Allergies    Medication List       Accurate as of 12/04/15  3:28 PM. Always use your most recent med list.          acetaminophen 325 MG tablet Commonly known as:  TYLENOL Take 650 mg by mouth every 6 (six) hours as needed.   ARTIFICIAL TEAR SOLUTION OP Apply 1 drop to eye daily as needed.   atorvastatin 10 MG tablet Commonly known as:  LIPITOR Take 1 tablet (10 mg total) by mouth daily at 6 PM.   benazepril-hydrochlorthiazide 20-12.5 MG tablet Commonly known as:  LOTENSIN HCT Take 0.5 tablets by mouth daily.   CALCIUM 600+D PLUS MINERALS 600-400 MG-UNIT Tabs Take 2 tablets by mouth 2 (two) times daily.   cefpodoxime 200 MG tablet Commonly known as:  VANTIN Take 1 tablet (200 mg total) by mouth 2 (two) times daily.   clopidogrel 75 MG tablet Commonly known as:  PLAVIX Take 1 tablet (75 mg total) by mouth daily.   diltiazem 60 MG tablet Commonly known as:  CARDIZEM Take 1 tablet (60 mg total) by mouth every 12 (twelve) hours.   dimenhyDRINATE 50 MG tablet Commonly known as:  DRAMAMINE Take 1 tablet every 6 hours as needed for nausea or dizziness   fluticasone 50 MCG/ACT nasal spray Commonly known as:  FLONASE Place 1 spray into both nostrils daily as needed for allergies or rhinitis. Reported on 09/28/2015   mesalamine 1.2 g EC tablet Commonly known as:  LIALDA take 2 tablets by mouth daily with BREAKFAST   metoprolol 50 MG tablet Commonly known as:  LOPRESSOR Take 1 tablet (50 mg total) by mouth 2 (two) times daily.   multivitamin tablet Take 1 tablet by mouth daily.   predniSONE 5 MG tablet Commonly known as:  DELTASONE TWO TABLETs DAILY TO TREAT POLYMYALGIA RHEUMATICA   ranitidine 150 MG tablet Commonly known as:   ZANTAC Take 150 mg by mouth at bedtime. Take one tablet at bedtime   sennosides-docusate sodium 8.6-50 MG tablet Commonly known as:  SENOKOT-S Take 1 tablet by mouth 3 (three) times daily. Take one tablet three times  daily for constipation-hold for loose stools       Review of Systems  Constitutional: Negative for activity change, appetite change, chills, diaphoresis, fatigue, fever and unexpected weight change.  HENT: Negative for congestion, ear discharge, ear pain, hearing loss, postnasal drip, rhinorrhea, sore throat, tinnitus, trouble swallowing and voice change.   Eyes: Positive for visual disturbance (corrective lenses). Negative for pain, redness and itching.  Respiratory: Negative for cough, choking, shortness of breath and wheezing.   Cardiovascular: Negative for chest pain, palpitations and leg swelling.  Gastrointestinal: Positive for diarrhea, nausea and vomiting. Negative for abdominal distention, abdominal pain and constipation.       Hx ulcerative colitis; on Lialda.  Endocrine: Negative for cold intolerance, heat intolerance, polydipsia, polyphagia and polyuria.       History of elevated glucose.  Genitourinary: Negative for difficulty urinating, dysuria, flank pain, frequency, hematuria, pelvic pain, urgency and vaginal discharge.  Musculoskeletal: Positive for back pain and gait problem. Negative for arthralgias, myalgias, neck pain and neck stiffness.       Hx PMR and chronic use of prednisone. History of osteopenia and kyphosis  Skin: Negative for color change, pallor and rash.  Allergic/Immunologic: Negative.   Neurological: Positive for dizziness and light-headedness. Negative for tremors, seizures, syncope, weakness, numbness and headaches.       History of poliomyelitis at age 47. History of CVA.09/21/15.  Hematological: Negative for adenopathy. Does not bruise/bleed easily.  Psychiatric/Behavioral: Negative for agitation, behavioral problems, confusion,  dysphoric mood, hallucinations, sleep disturbance and suicidal ideas. The patient is not nervous/anxious and is not hyperactive.     Vitals:   12/04/15 1520  BP: (!) 168/88  Pulse: 60  Resp: 18  Temp: 97 F (36.1 C)  SpO2: 97%  Weight: 121 lb 14.4 oz (55.3 kg)  Height: 5\' 4"  (1.626 m)   Body mass index is 20.92 kg/m. Physical Exam  Constitutional: She is oriented to person, place, and time. She appears well-developed and well-nourished. No distress.  HENT:  Right Ear: External ear normal.  Left Ear: External ear normal.  Nose: Nose normal.  Mouth/Throat: Oropharynx is clear and moist. No oropharyngeal exudate.  Wearing hearing aids bilaterally. EAC and TM normal bilaterally.  Eyes: Conjunctivae and EOM are normal. Pupils are equal, round, and reactive to light. No scleral icterus.  Prescription lenses  Neck: No JVD present. No tracheal deviation present. No thyromegaly present.  Cardiovascular: Normal rate, regular rhythm, normal heart sounds and intact distal pulses.  Exam reveals no gallop and no friction rub.   No murmur heard. Pulmonary/Chest: Effort normal. No respiratory distress. She has no wheezes. She has no rales. She exhibits no tenderness.  Abdominal: Soft. Bowel sounds are normal. She exhibits no distension and no mass. There is no tenderness.  Large diastasis recti  Musculoskeletal: Normal range of motion. She exhibits no edema or tenderness.  Kyphosis.. Miild gait instability.  Lymphadenopathy:    She has no cervical adenopathy.  Neurological: She is alert and oriented to person, place, and time. No cranial nerve deficit. Coordination normal.  Mild tremor of extended arms  Skin: No rash noted. She is not diaphoretic. No erythema. No pallor.  Large ecchymoses present on the posterior leg and buttocks. No known trauma.  Psychiatric: She has a normal mood and affect. Her behavior is normal. Judgment and thought content normal.    Labs reviewed: Basic Metabolic  Panel:  Recent Labs  11/28/15 0849  11/30/15 1941 12/01/15 0224 12/02/15 1113  NA 131*  < >  127* 132* 134*  K 3.3*  < > 3.3* 3.0* 4.1  CL 93*  < > 95* 98* 102  CO2 27  < > 23 27 27   GLUCOSE 171*  < > 229* 102* 114*  BUN 14  < > 14 12 12   CREATININE 0.93  < > 0.97 0.95 1.08*  CALCIUM 8.4*  < > 8.1* 8.2* 8.6*  MG 2.6*  --  1.8 1.8  --   < > = values in this interval not displayed. Liver Function Tests:  Recent Labs  11/25/15 0512 11/26/15 1013 11/27/15 0243  AST 31 21 18   ALT 20 20 16   ALKPHOS 75 64 57  BILITOT 1.0 0.8 0.9  PROT 7.5 7.3 6.1*  ALBUMIN 4.1 4.0 3.4*   No results for input(s): LIPASE, AMYLASE in the last 8760 hours. No results for input(s): AMMONIA in the last 8760 hours. CBC:  Recent Labs  09/22/15 0832  09/28/15 1636  11/26/15 1013 11/27/15 0243 12/01/15 0224 12/02/15 1113  WBC 8.7  < > 7.6  < > 13.3* 11.5* 6.5 6.9  NEUTROABS 7.7  --  6.9  --  11.3*  --   --   --   HGB 11.8*  < > 12.7  < > 13.4 12.0 9.8* 10.8*  HCT 37.4  < > 40.3  < > 39.1 36.4 30.9* 34.8*  MCV 91.2  < > 93.3  < > 88.1 89.4 92.0 94.1  PLT 178  < > 212  < > 209 163 136* 150  < > = values in this interval not displayed. Cardiac Enzymes:  Recent Labs  11/26/15 1013 11/26/15 1640 11/26/15 2300  TROPONINI 0.06* 0.07* 0.07*   BNP: Invalid input(s): POCBNP Lab Results  Component Value Date   HGBA1C 6.0 08/23/2015   Lab Results  Component Value Date   TSH 2.008 09/25/2015   Lab Results  Component Value Date   VITAMINB12 254 05/17/2014   No results found for: FOLATE Lab Results  Component Value Date   IRON 43 12/07/2014    Imaging and Procedures obtained prior to SNF admission: Ct Head Wo Contrast  Result Date: 11/26/2015 CLINICAL DATA:  75 year old female with altered mental status. EXAM: CT HEAD WITHOUT CONTRAST TECHNIQUE: Contiguous axial images were obtained from the base of the skull through the vertex without intravenous contrast. COMPARISON:  11/25/2015 and  prior CTs dating back to 09/21/2015 FINDINGS: Brain: No evidence of acute infarction, hemorrhage, hydrocephalus, extra-axial collection or mass lesion/mass effect. Atrophy and chronic small-vessel white matter ischemic changes again identified. Vascular: Intracranial atherosclerotic calcifications noted. Skull: Unremarkable Sinuses/Orbits: Visualized portions unremarkable. Other: None IMPRESSION: No evidence of acute intracranial abnormality. Atrophy and chronic small-vessel white matter ischemic changes. Electronically Signed   By: Margarette Canada M.D.   On: 11/26/2015 11:25   Dg Chest Port 1 View  Result Date: 11/26/2015 CLINICAL DATA:  75 year old female with a history of altered mental status EXAM: PORTABLE CHEST 1 VIEW COMPARISON:  11/25/2015, 09/21/2015 FINDINGS: Cardiomediastinal silhouette unchanged in size and contour. Calcifications of the aortic arch. Since the prior there has been removal of the event recorder on the chest wall. Stigmata of emphysema, with flattened hemidiaphragms, and hyperinflation. Chronic interstitial opacities persist. Pleural parenchymal thickening at the lung apices. No pleural effusion or confluent airspace disease.  No pneumothorax. No displaced fracture. IMPRESSION: Chronic lung changes and emphysema without evidence of superimposed acute cardiopulmonary disease. Aortic atherosclerosis. Signed, Dulcy Fanny. Earleen Newport, DO [facility] Electronically Signed  By: Corrie Mckusick D.O.   On: 11/26/2015 10:37    Assessment/Plan  1. Citrobacter infection Finishing antibiotic  2. Cerebrovascular accident (CVA) due to thrombosis of precerebral artery ([place]) Still recovering  3. Essential hypertension controlled  4. Polymyalgia rheumatica (HCC) Continues prednisone at 5 mg daily  5. Abnormality of gait Engaged in PT and OT

## 2015-12-04 NOTE — Telephone Encounter (Signed)
Left message for the nurse with [facility] to call back.

## 2015-12-04 NOTE — Telephone Encounter (Signed)
Follow up        [facility] is having trouble with their phones.  Please call back on [phone] line.

## 2015-12-04 NOTE — Telephone Encounter (Signed)
New Message  Daiva Eves from [facility] call requesting to speak with RN. Daiva Eves states pt was just released from the hospital and would like to know how to proceed with pt Cardiac Monitor. Please call back to discuss

## 2015-12-04 NOTE — Telephone Encounter (Signed)
Tried to call [facility] back, second number is a fax machine and first number is wrong number. Monitor in question was ordered by Dr. Erlinda Hong. Will forward to monitor techs.

## 2015-12-04 NOTE — Telephone Encounter (Signed)
Follow up       Nurse called back with a new phone number.  Please call [phone].

## 2015-12-05 ENCOUNTER — Telehealth: Payer: Self-pay | Admitting: Cardiovascular Disease

## 2015-12-05 NOTE — Telephone Encounter (Signed)
Daiva Eves [facility] [place] ) is returning your call . She will be in a meeting until 12:30pm . Can ask to speak to [employer]

## 2015-12-05 NOTE — Telephone Encounter (Signed)
Spoke with Jefm Bryant at [facility]. The monitor is due to be mailed back on the 15th. I explained the mailing instructions are in the box. If they have any difficulties they can call the monitor company. They are available 24 hrs a day

## 2015-12-05 NOTE — Telephone Encounter (Signed)
New Message  Jefm Bryant from [facility] voiced for Nurse here to contact Nurse for [employer].  Please f/u

## 2015-12-05 NOTE — Telephone Encounter (Signed)
Tammy Beard      9:02 AM  Note    Spoke with Jefm Bryant at [facility]. The monitor is due to be mailed back on the 15th. I explained the mailing instructions are in the box. If they have any difficulties they can call the monitor company. They are available 24 hrs a day     Informed [facility] Nurse of [name]'s pervious message about monitor.

## 2015-12-06 LAB — BASIC METABOLIC PANEL
BUN: 30 mg/dL — AB (ref 4–21)
CREATININE: 1.2 mg/dL — AB (ref <=1.1)
GLUCOSE: 159 mg/dL
POTASSIUM: 4.3 mmol/L (ref 3.4–5.3)
Sodium: 134 mmol/L — AB (ref 137–147)

## 2015-12-11 ENCOUNTER — Other Ambulatory Visit: Payer: Self-pay | Admitting: *Deleted

## 2015-12-11 ENCOUNTER — Non-Acute Institutional Stay (SKILLED_NURSING_FACILITY): Payer: Medicare Other | Admitting: Nurse Practitioner

## 2015-12-11 ENCOUNTER — Telehealth: Payer: Self-pay

## 2015-12-11 ENCOUNTER — Encounter: Payer: Self-pay | Admitting: Nurse Practitioner

## 2015-12-11 DIAGNOSIS — M353 Polymyalgia rheumatica: Secondary | ICD-10-CM

## 2015-12-11 DIAGNOSIS — I471 Supraventricular tachycardia: Secondary | ICD-10-CM | POA: Diagnosis not present

## 2015-12-11 DIAGNOSIS — E871 Hypo-osmolality and hyponatremia: Secondary | ICD-10-CM | POA: Diagnosis not present

## 2015-12-11 DIAGNOSIS — I10 Essential (primary) hypertension: Secondary | ICD-10-CM | POA: Diagnosis not present

## 2015-12-11 DIAGNOSIS — E876 Hypokalemia: Secondary | ICD-10-CM

## 2015-12-11 DIAGNOSIS — I5042 Chronic combined systolic (congestive) and diastolic (congestive) heart failure: Secondary | ICD-10-CM

## 2015-12-11 DIAGNOSIS — N390 Urinary tract infection, site not specified: Secondary | ICD-10-CM

## 2015-12-11 DIAGNOSIS — I6300 Cerebral infarction due to thrombosis of unspecified precerebral artery: Secondary | ICD-10-CM | POA: Diagnosis not present

## 2015-12-11 DIAGNOSIS — A419 Sepsis, unspecified organism: Secondary | ICD-10-CM

## 2015-12-11 DIAGNOSIS — K219 Gastro-esophageal reflux disease without esophagitis: Secondary | ICD-10-CM | POA: Diagnosis not present

## 2015-12-11 NOTE — Assessment & Plan Note (Signed)
12/07/15 Na 134, K 4.3, Bun 30, creat 1.15 Continue Metoprolol 50mg , Diltiazem 60mg  bid, Lotensin 20/12.5mg 

## 2015-12-11 NOTE — Assessment & Plan Note (Signed)
Stable, continue Ranitidine.  

## 2015-12-11 NOTE — Assessment & Plan Note (Signed)
Hx of it, taking Plavix and statin. Noted left face and right limb weakness, mild, will obtain CBC, CMP, CT head w/o CM, UA C/S

## 2015-12-11 NOTE — Telephone Encounter (Signed)
Notes Recorded by Marval Regal, RN on 12/11/2015 at 1:51 PM EDT LFt vm for patient to call back about her 30 day monitor.

## 2015-12-11 NOTE — Assessment & Plan Note (Signed)
Compensated clinically.  

## 2015-12-11 NOTE — Assessment & Plan Note (Signed)
12/07/15 Na 134, K 4.3, Bun 30, creat 1.15

## 2015-12-11 NOTE — Assessment & Plan Note (Signed)
Managed with Prednisone 5mg daily 

## 2015-12-11 NOTE — Assessment & Plan Note (Signed)
Complete 7 day course of Vantin in SNF, repeat UA C/S

## 2015-12-11 NOTE — Progress Notes (Signed)
Location:  [facility] Room Number: 34 Place of Service:  SNF (31) Provider:  Cynthis Purington, Manxie  NP  Jeanmarie Hubert, MD  Patient Care Team: [name], MD as PCP - General (Internal Medicine) [name], MD as Consulting Physician (Cardiology) [name], MD as Consulting Physician (Ophthalmology) [name], MD as Consulting Physician (Gastroenterology) [name], MD as Consulting Physician (Dermatology)  Extended Emergency Contact Information Primary Emergency Contact: [name]) Address: [place], [place] [address] [name] of [place] Phone: [phone] Mobile Phone: [phone] Relation: Brother Secondary Emergency Contact: [name] [place] of [place] Phone: [phone] Relation: Sister  Code Status:  DNR Goals of care: Advanced Directive information Advanced Directives 12/11/2015  Does patient have an advance directive? Yes  Type of Paramedic of [place];Living will;Out of facility DNR (pink MOST or yellow form)  Does patient want to make changes to advanced directive? No - Patient declined  Copy of advanced directive(s) in chart? Yes     Chief Complaint  Patient presents with  . Acute Visit    not feeling up to par, gait/balance off,    HPI:  Pt is a 75 y.o. female seen today for an acute visit for poor balance, increased confusion, noted left limbs weaker than the right even if muscle strength 5/5, increased gait problem.   11/26/15 CT head No evidence of acute intracranial abnormality. Atrophy and chronic small-vessel white matter ischemic changes.  Hospitalized 11/26/15-12/03/15 for Pyelonephritis, Citrobacter Freundii, 7 day course Vantin 200mg  bid to be completed at [facility].   Past Medical History:  Diagnosis Date  . Colitis   . Ecchymosis 11/15/2015  . GERD (gastroesophageal reflux disease)   . Heart murmur   . Hyperlipidemia   .  Hypertension   . Osteopenia    s/p 7 yrs Fosamax  . Polio   . Polymyalgia ([place])   . Rheumatic fever   . Sleep disorder   . Stroke ([place])   . Ulcerative colitis    Past Surgical History:  Procedure Laterality Date  . BREAST LUMPECTOMY  1986   left; benign  . CATARACT EXTRACTION     Left eye   . COLONOSCOPY  2006  . Colonosopy  07/05/13   Dr. Deatra Ina  . DILATION AND CURETTAGE OF UTERUS  12/1999  . MELANOMA EXCISION  2007   left arm  . NECK SURGERY  1986   Benign growth on neck removed  . TONSILLECTOMY      No Known Allergies    Medication List       Accurate as of 12/11/15  4:17 PM. Always use your most recent med list.          acetaminophen 325 MG tablet Commonly known as:  TYLENOL Take 650 mg by mouth every 6 (six) hours as needed.   ARTIFICIAL TEAR SOLUTION OP Apply 1 drop to eye daily as needed.   atorvastatin 10 MG tablet Commonly known as:  LIPITOR Take 1 tablet (10 mg total) by mouth daily at 6 PM.   benazepril-hydrochlorthiazide 20-12.5 MG tablet Commonly known as:  LOTENSIN HCT Take 0.5 tablets by mouth daily.   CALCIUM 600+D PLUS MINERALS 600-400 MG-UNIT Tabs Take 2 tablets by mouth 2 (two) times daily.   cefpodoxime 200 MG tablet Commonly known as:  VANTIN Take 200 mg by mouth 2 (two) times daily.   clopidogrel 75 MG tablet Commonly  known as:  PLAVIX Take 1 tablet (75 mg total) by mouth daily.   diltiazem 60 MG tablet Commonly known as:  CARDIZEM Take 1 tablet (60 mg total) by mouth every 12 (twelve) hours.   dimenhyDRINATE 50 MG tablet Commonly known as:  DRAMAMINE Take 1 tablet every 6 hours as needed for nausea or dizziness   fluticasone 50 MCG/ACT nasal spray Commonly known as:  FLONASE Place 1 spray into both nostrils daily as needed for allergies or rhinitis. Reported on 09/28/2015   mesalamine 1.2 g EC tablet Commonly known as:  LIALDA take 2 tablets by mouth daily with BREAKFAST   metoprolol 50 MG tablet Commonly known as:   LOPRESSOR Take 1 tablet (50 mg total) by mouth 2 (two) times daily.   multivitamin tablet Take 1 tablet by mouth daily.   predniSONE 5 MG tablet Commonly known as:  DELTASONE TWO TABLETs DAILY TO TREAT POLYMYALGIA RHEUMATICA   ranitidine 150 MG tablet Commonly known as:  ZANTAC Take 150 mg by mouth at bedtime. Take one tablet at bedtime   sennosides-docusate sodium 8.6-50 MG tablet Commonly known as:  SENOKOT-S Take 1 tablet by mouth 3 (three) times daily. Take one tablet three times daily for constipation-hold for loose stools       Review of Systems  Constitutional: Positive for activity change, appetite change and fatigue. Negative for chills, diaphoresis, fever and unexpected weight change.  HENT: Negative for congestion, ear discharge, ear pain, hearing loss, postnasal drip, rhinorrhea, sore throat, tinnitus, trouble swallowing and voice change.   Eyes: Positive for visual disturbance (corrective lenses). Negative for pain, redness and itching.  Respiratory: Negative for cough, choking, shortness of breath and wheezing.   Cardiovascular: Negative for chest pain, palpitations and leg swelling.  Gastrointestinal: Positive for diarrhea, nausea and vomiting. Negative for abdominal distention, abdominal pain and constipation.       Hx ulcerative colitis; on Lialda.  Endocrine: Negative for cold intolerance, heat intolerance, polydipsia, polyphagia and polyuria.       History of elevated glucose.  Genitourinary: Negative for difficulty urinating, dysuria, flank pain, frequency, hematuria, pelvic pain, urgency and vaginal discharge.  Musculoskeletal: Positive for back pain and gait problem. Negative for arthralgias, myalgias, neck pain and neck stiffness.       Hx PMR and chronic use of prednisone. History of osteopenia and kyphosis  Skin: Negative for color change, pallor and rash.  Allergic/Immunologic: Negative.   Neurological: Positive for dizziness, weakness and  light-headedness. Negative for tremors, seizures, syncope, numbness and headaches.       History of poliomyelitis at age 38. History of CVA.09/21/15.  Hematological: Negative for adenopathy. Does not bruise/bleed easily.  Psychiatric/Behavioral: Negative for agitation, behavioral problems, confusion, dysphoric mood, hallucinations, sleep disturbance and suicidal ideas. The patient is not nervous/anxious and is not hyperactive.     Immunization History  Administered Date(s) Administered  . Hep A / Hep B 06/08/2013, 06/15/2013, 06/29/2013, 06/12/2014  . Influenza Split 01/01/2014  . Influenza Whole 01/17/2009, 12/02/2010  . Influenza-Unspecified 12/22/2012, 12/22/2013, 12/21/2014  . Pneumococcal Conjugate-13 01/27/2015  . Pneumococcal Polysaccharide-23 07/25/2009  . Td 07/25/2009  . Tdap 12/22/2011   Pertinent  Health Maintenance Due  Topic Date Due  . MAMMOGRAM  07/18/2015  . INFLUENZA VACCINE  10/23/2015  . DEXA SCAN  Completed  . PNA vac Low Risk Adult  Completed   Fall Risk  11/02/2015 05/01/2015 01/23/2015 07/20/2014 04/20/2013  Falls in the past year? No No No No No   Functional Status  Survey:    Vitals:   12/11/15 1437  BP: (!) 145/79  Pulse: (!) 101  Resp: 20  Temp: 98 F (36.7 C)  SpO2: 97%  Weight: 118 lb (53.5 kg)  Height: 5\' 1"  (1.549 m)   Body mass index is 22.3 kg/m. Physical Exam  Constitutional: She is oriented to person, place, and time. She appears well-developed and well-nourished. No distress.  HENT:  Right Ear: External ear normal.  Left Ear: External ear normal.  Nose: Nose normal.  Mouth/Throat: Oropharynx is clear and moist. No oropharyngeal exudate.  Wearing hearing aids bilaterally. EAC and TM normal bilaterally.  Eyes: Conjunctivae and EOM are normal. Pupils are equal, round, and reactive to light. No scleral icterus.  Prescription lenses  Neck: No JVD present. No tracheal deviation present. No thyromegaly present.  Cardiovascular: Normal rate,  regular rhythm, normal heart sounds and intact distal pulses.  Exam reveals no gallop and no friction rub.   No murmur heard. Pulmonary/Chest: Effort normal. No respiratory distress. She has no wheezes. She has no rales. She exhibits no tenderness.  Abdominal: Soft. Bowel sounds are normal. She exhibits no distension and no mass. There is no tenderness.  Large diastasis recti  Musculoskeletal: Normal range of motion. She exhibits no edema or tenderness.  Kyphosis.. Miild gait instability.  Lymphadenopathy:    She has no cervical adenopathy.  Neurological: She is alert and oriented to person, place, and time. No cranial nerve deficit. Coordination normal.  Mild tremor of extended arms. Noted right arm and leg weaker than the right, muscle strength 5/5, wobbly gait, mild right face weakness.   Skin: No rash noted. She is not diaphoretic. No erythema. No pallor.  Large ecchymoses present on the posterior leg and buttocks. No known trauma.  Psychiatric: She has a normal mood and affect. Her behavior is normal. Judgment and thought content normal.    Labs reviewed:  Recent Labs  11/28/15 0849  11/30/15 1941 12/01/15 0224 12/02/15 1113 12/06/15  NA 131*  < > 127* 132* 134* 134*  K 3.3*  < > 3.3* 3.0* 4.1 4.3  CL 93*  < > 95* 98* 102  --   CO2 27  < > 23 27 27   --   GLUCOSE 171*  < > 229* 102* 114*  --   BUN 14  < > 14 12 12  30*  CREATININE 0.93  < > 0.97 0.95 1.08* 1.2*  CALCIUM 8.4*  < > 8.1* 8.2* 8.6*  --   MG 2.6*  --  1.8 1.8  --   --   < > = values in this interval not displayed.  Recent Labs  11/25/15 0512 11/26/15 1013 11/27/15 0243  AST 31 21 18   ALT 20 20 16   ALKPHOS 75 64 57  BILITOT 1.0 0.8 0.9  PROT 7.5 7.3 6.1*  ALBUMIN 4.1 4.0 3.4*    Recent Labs  09/22/15 0832  09/28/15 1636  11/26/15 1013 11/27/15 0243 12/01/15 0224 12/02/15 1113  WBC 8.7  < > 7.6  < > 13.3* 11.5* 6.5 6.9  NEUTROABS 7.7  --  6.9  --  11.3*  --   --   --   HGB 11.8*  < > 12.7  < >  13.4 12.0 9.8* 10.8*  HCT 37.4  < > 40.3  < > 39.1 36.4 30.9* 34.8*  MCV 91.2  < > 93.3  < > 88.1 89.4 92.0 94.1  PLT 178  < > 212  < > 209  163 136* 150  < > = values in this interval not displayed. Lab Results  Component Value Date   TSH 2.008 09/25/2015   Lab Results  Component Value Date   HGBA1C 6.0 08/23/2015   Lab Results  Component Value Date   CHOL 180 08/23/2015   HDL 73 (A) 08/23/2015   LDLCALC 63 08/23/2015   TRIG 222 (A) 08/23/2015   CHOLHDL 3 11/14/2008    Significant Diagnostic Results in last 30 days:  Dg Chest 2 View  Result Date: 11/25/2015 CLINICAL DATA:  Acute onset of generalized weakness. Initial encounter. EXAM: CHEST  2 VIEW COMPARISON:  Chest radiograph performed 09/21/2015 FINDINGS: The lungs are hyperexpanded, with flattening of the hemidiaphragms, compatible with COPD. Mild left basilar atelectasis is noted. There is no evidence of pleural effusion or pneumothorax. The heart is borderline enlarged. No acute osseous abnormalities are seen. Postoperative change is noted overlying the mediastinum. IMPRESSION: Mild left basilar atelectasis noted. Borderline cardiomegaly. Findings of COPD. Electronically Signed   By: Garald Balding M.D.   On: 11/25/2015 06:12   Ct Head Wo Contrast  Result Date: 11/26/2015 CLINICAL DATA:  75 year old female with altered mental status. EXAM: CT HEAD WITHOUT CONTRAST TECHNIQUE: Contiguous axial images were obtained from the base of the skull through the vertex without intravenous contrast. COMPARISON:  11/25/2015 and prior CTs dating back to 09/21/2015 FINDINGS: Brain: No evidence of acute infarction, hemorrhage, hydrocephalus, extra-axial collection or mass lesion/mass effect. Atrophy and chronic small-vessel white matter ischemic changes again identified. Vascular: Intracranial atherosclerotic calcifications noted. Skull: Unremarkable Sinuses/Orbits: Visualized portions unremarkable. Other: None IMPRESSION: No evidence of acute  intracranial abnormality. Atrophy and chronic small-vessel white matter ischemic changes. Electronically Signed   By: Margarette Canada M.D.   On: 11/26/2015 11:25   Ct Head Wo Contrast  Result Date: 11/25/2015 CLINICAL DATA:  Acute onset of generalized weakness and dizziness. Initial encounter. EXAM: CT HEAD WITHOUT CONTRAST TECHNIQUE: Contiguous axial images were obtained from the base of the skull through the vertex without intravenous contrast. COMPARISON:  CTA of the head performed 09/24/2015, and MRA of the brain performed 09/22/2015 FINDINGS: Brain: No evidence of acute infarction, hemorrhage, hydrocephalus, extra-axial collection or mass lesion/mass effect. Prominence of ventricles and sulci reflects mild to moderate cortical volume loss. Cerebellar atrophy is noted. Scattered periventricular white matter change likely reflects small vessel ischemic microangiopathy. The brainstem and fourth ventricle are within normal limits. The basal ganglia are unremarkable in appearance. The cerebral hemispheres demonstrate grossly normal gray-white differentiation. No mass effect or midline shift is seen. Vascular: No hyperdense vessel or unexpected calcification. Skull: There is no evidence of fracture; visualized osseous structures are unremarkable in appearance. Sinuses/Orbits: The orbits are within normal limits. The paranasal sinuses are well-aerated. There is partial opacification of the mastoid air cells bilaterally. Other: No significant soft tissue abnormalities are seen. IMPRESSION: 1. No acute intracranial pathology seen on CT. 2. Mild to moderate cortical volume loss and scattered small vessel ischemic microangiopathy. 3. Partial opacification of the mastoid air cells bilaterally. Electronically Signed   By: Garald Balding M.D.   On: 11/25/2015 06:27   Dg Chest Port 1 View  Result Date: 11/26/2015 CLINICAL DATA:  75 year old female with a history of altered mental status EXAM: PORTABLE CHEST 1 VIEW  COMPARISON:  11/25/2015, 09/21/2015 FINDINGS: Cardiomediastinal silhouette unchanged in size and contour. Calcifications of the aortic arch. Since the prior there has been removal of the event recorder on the chest wall. Stigmata of emphysema, with flattened hemidiaphragms,  and hyperinflation. Chronic interstitial opacities persist. Pleural parenchymal thickening at the lung apices. No pleural effusion or confluent airspace disease.  No pneumothorax. No displaced fracture. IMPRESSION: Chronic lung changes and emphysema without evidence of superimposed acute cardiopulmonary disease. Aortic atherosclerosis. Signed, Dulcy Fanny. Earleen Newport, DO [facility] Electronically Signed   By: Corrie Mckusick D.O.   On: 11/26/2015 10:37    Assessment/Plan There are no diagnoses linked to this encounter.   Family/ staff Communication: continue SNF for rehab.   Labs/tests ordered:  CBC, CMP, UA C/S, CT head wo CM

## 2015-12-11 NOTE — Assessment & Plan Note (Signed)
Heart rate is in controlled, continue Metoprolol and Diltiazem.

## 2015-12-11 NOTE — Telephone Encounter (Signed)
-----   Message from Rosalin Hawking, MD sent at 12/11/2015 12:37 PM EDT ----- Could you please let the patient know that the 30 day cardiac event monitoring test done recently was negative for afib. Please continue current treatment. Thanks.  Rosalin Hawking, MD PhD Stroke Neurology 12/11/2015 12:37 PM

## 2015-12-12 ENCOUNTER — Other Ambulatory Visit: Payer: Self-pay | Admitting: Internal Medicine

## 2015-12-12 DIAGNOSIS — R531 Weakness: Secondary | ICD-10-CM

## 2015-12-12 LAB — BASIC METABOLIC PANEL
BUN: 30 mg/dL — AB (ref 4–21)
Creatinine: 1 mg/dL (ref <=1.1)
Glucose: 88 mg/dL
Potassium: 3.9 mmol/L (ref 3.4–5.3)
SODIUM: 135 mmol/L — AB (ref 137–147)

## 2015-12-12 LAB — HEPATIC FUNCTION PANEL
ALK PHOS: 68 U/L (ref 25–125)
ALT: 14 U/L (ref 7–35)
AST: 13 U/L (ref 13–35)
BILIRUBIN, TOTAL: 0.4 mg/dL

## 2015-12-12 LAB — CBC AND DIFFERENTIAL
HEMATOCRIT: 35 % — AB (ref 36–46)
HEMOGLOBIN: 11.2 g/dL — AB (ref 12.0–16.0)
Platelets: 205 10*3/uL (ref 150–399)
WBC: 4.8 10^3/mL

## 2015-12-13 ENCOUNTER — Other Ambulatory Visit: Payer: Self-pay | Admitting: *Deleted

## 2015-12-13 ENCOUNTER — Telehealth: Payer: Self-pay

## 2015-12-13 NOTE — Telephone Encounter (Signed)
2 two paged letters received from McIntosh Chaisson(patient's brother) with several concerns for Dr.Green to address upon returning into the office.  I contacted Laurey Arrow to inform him that Dr.Green is out of office until 12-25-15. If Laurey Arrow wishes to have multiple concerns addressed prior, patient to schedule appointment here with another provider.  Letter's placed in Dr.Green's review and sign folder

## 2015-12-18 NOTE — Telephone Encounter (Signed)
Notes Recorded by Marval Regal, RN on 12/18/2015 at 10:50 AM EDT Rn call patients and spoke with her brother Gwyndolyn Saxon who is on the [facility] and HCPOA. Rn stated pts 30 day cardiac monitoring was negative for any atrial fibrillation.Please continue treatment plan. Pts brother verbalized understanding.

## 2015-12-19 ENCOUNTER — Other Ambulatory Visit: Payer: Self-pay | Admitting: Internal Medicine

## 2015-12-19 DIAGNOSIS — R4189 Other symptoms and signs involving cognitive functions and awareness: Secondary | ICD-10-CM

## 2015-12-20 ENCOUNTER — Encounter: Payer: Self-pay | Admitting: Neurology

## 2015-12-20 ENCOUNTER — Ambulatory Visit (INDEPENDENT_AMBULATORY_CARE_PROVIDER_SITE_OTHER): Payer: Medicare Other | Admitting: Neurology

## 2015-12-20 ENCOUNTER — Telehealth: Payer: Self-pay

## 2015-12-20 ENCOUNTER — Telehealth: Payer: Self-pay | Admitting: Neurology

## 2015-12-20 VITALS — BP 86/52 | HR 68 | Resp 20

## 2015-12-20 DIAGNOSIS — I69998 Other sequelae following unspecified cerebrovascular disease: Secondary | ICD-10-CM

## 2015-12-20 DIAGNOSIS — I63233 Cerebral infarction due to unspecified occlusion or stenosis of bilateral carotid arteries: Secondary | ICD-10-CM

## 2015-12-20 DIAGNOSIS — H539 Unspecified visual disturbance: Secondary | ICD-10-CM

## 2015-12-20 DIAGNOSIS — I639 Cerebral infarction, unspecified: Secondary | ICD-10-CM

## 2015-12-20 DIAGNOSIS — E860 Dehydration: Secondary | ICD-10-CM

## 2015-12-20 DIAGNOSIS — I63522 Cerebral infarction due to unspecified occlusion or stenosis of left anterior cerebral artery: Secondary | ICD-10-CM | POA: Diagnosis not present

## 2015-12-20 DIAGNOSIS — I69398 Other sequelae of cerebral infarction: Principal | ICD-10-CM

## 2015-12-20 MED ORDER — ASPIRIN-DIPYRIDAMOLE ER 25-200 MG PO CP12: 1.0000 | ORAL_CAPSULE | Freq: Two times a day (BID) | ORAL | 1 refills | Status: DC

## 2015-12-20 NOTE — Telephone Encounter (Signed)
I spoke to [place] at [facility]. He verifies that pt does have L sided weakness, headaches, and confusion. I advised him that this could be s/s of a stroke and advised him to take the pt to the ED. He says that [facility] does not want to take her to the ED but wants her to see a neurologist today or tomorrow. I again advised him that ED is the best option for those new onset s/s of stroke, but Jefm Bryant again asked if any doctor here is available to see the pt today or tomorrow.  I spoke to Dr. Brett Fairy. She agrees that pt should proceed to ED. I advised her that I have already advised Jefm Bryant at [facility] of this, but they refused. She is agreeable to seeing the pt at 2:30 today.   I called Jefm Bryant and told him that the pt could be seen today at 2:30. He says that he needs to make sure they can do this, and he will call me back.

## 2015-12-20 NOTE — Telephone Encounter (Signed)
Phone room staff informed me that Tammy Beard called back to confirm that pt will be here at 2:30. Pt has been placed on the schedule.

## 2015-12-20 NOTE — Telephone Encounter (Signed)
Received a call from [place] at [facility]. They would like to know if pt should continue plavix. Dr. Brett Fairy told pt to stop aggrenox. I skyped Dr. Brett Fairy but she did not respond. I will have to speak to her personally and then call Erica back at [phone].

## 2015-12-20 NOTE — Progress Notes (Signed)
Provider:  Larey Seat, M D  Referring Provider: [name], MD Primary Care Physician:  Jeanmarie Hubert, MD  Chief Complaint  Patient presents with  . Extremity Weakness    sister Tammy Beard, increased confusion, hospitalized 2 weeks ago, decline from baseline, urgent work in    HPI:  Tammy Beard is a 75 y.o. female  , who  seen here as a urgent revisit  from Dr. Nyoka Cowden for possible stroke related worsening of her underlying condition.  She was [facility] about 14 days [facility] which caused cognitive confusion and decline. Since June she has been more dizzy and has an unstable gait. At admission she was also found to be dehydrated and treated with antibiotics. Her MRI showed a tiny right ACA infarct but the CT angiogram of head and neck documented severe intracranial stenosis involving bilateral and flow of the internal carotid arteries, bilateral involvement of the middle cerebral arteries. It was recommended that she has a 30 day cardiac event monitor upon discharge. She wore the event monitor apparently for about 3 weeks before she was hospitalized at again and that was for the UTI. During the interval time my colleague have described the patient is doing fair. Her sister accompanied her today to this visit and states that she has noted Tammy Beard to have declined over the last 2 weeks progressively being cognitively less alert and oriented and physically overall more impaired.  She described that the left body side seems to be increasingly weak more than it was 2 weeks ago, and that there is a delayed reaction to verbal stimuli. Tammy Beard also reports a headache that she suffers every morning involving the for head and the area behind the eyes. May be more affecting the right side. Both the patient and her sister described that since August visit with Dr. Erlinda Hong, the patient had actually made great strides to walk better and that over the last 14 days   has reverted back to wheelchair dependency.     MMSE - Mini Mental State Exam 12/20/2015  Orientation to time 3  Orientation to Place 5  Registration 0  Attention/ Calculation 1  Recall 1  Language- name 2 objects 2  Language- repeat 0  Language- follow 3 step command 3  Language- read & follow direction 1  Write a sentence 0  Write a sentence-comments left side weak, unable to write  Copy design 0  Copy design-comments left side weak, unable to draw  Total score 16     Review of Systems: Out of a complete 14 system review, the patient complains of only the following symptoms, and all other reviewed systems are negative.   Social History   Social History  . Marital status: Single    Spouse name: N/A  . Number of children: N/A  . Years of education: N/A   Occupational History  . retired    Social History Main Topics  . Smoking status: Never Smoker  . Smokeless tobacco: Never Used  . Alcohol use Yes     Comment: occasional  . Drug use: No  . Sexual activity: No   Other Topics Concern  . Not on file   Social History Narrative   Daily Caffeine use- soda (diet)      Do you drink/ eat things with caffeine? Yes, sometimes      Marital status:    Never  Lives at [facility], transfer to skill unit 09/26/15, transfer to [place] 11/09/15      Do you have any pets in your home ?(please list) No      Current or past profession: Geophysicist/field seismologist for Social worker      Do you exercise?    No                             Living Will, POA, DNR, MOST    Family History  Problem Relation Age of Onset  . Pancreatic cancer Other   . Other Mother     Coad/ Bronchiectosis  . Diabetes Paternal Grandmother   . Heart failure Paternal Grandmother   . Cancer Paternal Aunt     unaware of primary site  . Coronary artery disease Neg Hx   . Colon cancer Neg Hx   . Rectal cancer Neg Hx   . Stomach cancer Neg Hx   . Heart attack Neg Hx     Past Medical  History:  Diagnosis Date  . Colitis   . Ecchymosis 11/15/2015  . GERD (gastroesophageal reflux disease)   . Heart murmur   . Hyperlipidemia   . Hypertension   . Osteopenia    s/p 7 yrs Fosamax  . Polio   . Polymyalgia ([place])   . Rheumatic fever   . Sleep disorder   . Stroke ([place])   . Ulcerative colitis     Past Surgical History:  Procedure Laterality Date  . BREAST LUMPECTOMY  1986   left; benign  . CATARACT EXTRACTION     Left eye   . COLONOSCOPY  2006  . Colonosopy  07/05/13   Dr. Deatra Ina  . DILATION AND CURETTAGE OF UTERUS  12/1999  . MELANOMA EXCISION  2007   left arm  . NECK SURGERY  1986   Benign growth on neck removed  . TONSILLECTOMY      Current Outpatient Prescriptions  Medication Sig Dispense Refill  . acetaminophen (TYLENOL) 325 MG tablet Take 650 mg by mouth every 6 (six) hours as needed.    . ARTIFICIAL TEAR SOLUTION OP Apply 1 drop to eye daily as needed.    Marland Kitchen atorvastatin (LIPITOR) 10 MG tablet Take 1 tablet (10 mg total) by mouth daily at 6 PM.    . benazepril-hydrochlorthiazide (LOTENSIN HCT) 20-12.5 MG tablet Take 0.5 tablets by mouth daily.    . Calcium Carbonate-Vit D-Min (CALCIUM 600+D PLUS MINERALS) 600-400 MG-UNIT TABS Take 2 tablets by mouth 2 (two) times daily.     . cefpodoxime (VANTIN) 200 MG tablet Take 200 mg by mouth 2 (two) times daily.    . clopidogrel (PLAVIX) 75 MG tablet Take 1 tablet (75 mg total) by mouth daily.    Marland Kitchen diltiazem (CARDIZEM) 60 MG tablet Take 1 tablet (60 mg total) by mouth every 12 (twelve) hours.    Marland Kitchen dimenhyDRINATE (DRAMAMINE) 50 MG tablet Take 1 tablet every 6 hours as needed for nausea or dizziness 24 tablet 0  . fluticasone (FLONASE) 50 MCG/ACT nasal spray Place 1 spray into both nostrils daily as needed for allergies or rhinitis. Reported on 09/28/2015    . mesalamine (LIALDA) 1.2 g EC tablet take 2 tablets by mouth daily with BREAKFAST 60 tablet 6  . metoprolol (LOPRESSOR) 50 MG tablet Take 1 tablet (50 mg total) by  mouth 2 (two) times daily.    . Multiple Vitamin (MULTIVITAMIN) tablet Take 1 tablet by  mouth daily.      . predniSONE (DELTASONE) 5 MG tablet TWO TABLETs DAILY TO TREAT POLYMYALGIA RHEUMATICA 180 tablet 2  . ranitidine (ZANTAC) 150 MG tablet Take 150 mg by mouth at bedtime. Take one tablet at bedtime     . sennosides-docusate sodium (SENOKOT-S) 8.6-50 MG tablet Take 1 tablet by mouth 3 (three) times daily. Take one tablet three times daily for constipation-hold for loose stools      No current facility-administered medications for this visit.     Allergies as of 12/20/2015  . (No Known Allergies)    Vitals: BP (!) 86/52   Beard 68   Resp 20  Last Weight:  Wt Readings from Last 1 Encounters:  12/11/15 118 lb (53.5 kg)   Last Height:   Ht Readings from Last 1 Encounters:  12/11/15 5\' 1"  (1.549 m)    Physical exam:  General: The patient is awake, alert and appears not in acute distress. Mrs. Leone answers our questions but she has problems to place events and onset of symptoms into a time-frame. Cardiovascular:  Regular rate and rhythm, without  murmurs or carotid bruit, and without distended neck veins. Respiratory: Lungs are clear to auscultation. Skin:  Without evidence of edema, or rash Trunk: BMI is  elevated  Neurologic exam : The patient is awake and alert, MMSE low score. severely impaired?  Was not done before.  She is left handed, her writing ability is lost.  Cranial nerves: Pupils are equal and briskly reactive to light. Exophthalmos left more than right .  Funduscopic exam without evidence of pallor or edema. Extraocular movements  in vertical and horizontal plane is restricted -Visual fields by finger perimetry are not evaluated- no cooperation by patient. Hearing to finger rub impaired . Facial sensation intact to fine touch. Facial motor strength is symmetric and tongue and uvula move midline. Tongue protrusion into either cheek is normal.  Shoulder shrug is normal.    Motor exam:  Normal tone appears overall elevated, ,muscle bulk  Is low , low  strength in all extremities. Coordination: unable to follow some simple instructions.  Gait and station: Patient wheelchair bound  Deep tendon reflexes: in the  upper and lower extremities are brisk, the right was brisker than the left. . Babinski maneuver response is downgoing. Assessment:  After physical and neurologic examination, review of laboratory studies, imaging, neurophysiology testing and pre-existing records, assessment is that of :  I am also concerned that Mrs. Deruiter may have suffered another stroke, possibly at the brainstem level. What is very concerning is her inability to follow with horizontal or vertical gaze. She seemed to be oriented to our conversation and could answer most of my questions and follow simple motor commands very well. She is definitely clumsy with her left hand she has brisk reflexes on the right to a level that I would call hyperreflexia and her left upper extremity also has brisk reflexes. There is a little bit of spasticity at higher tone noted. I will order a repeat image of the brain, and since she is on aspirin and Plavix I would like for her to change to Aggrenox. She states that she is taking Tylenol every day as a premedication because of waking up with a headache this is likely not to change for while. I will also ask that the imaging study will address possible sinusitis because of the location and the character of the pain and association with nasal drip and postnasal drip.  Plan:  Treatment plan and additional workup :  RV with Dr Erlinda Hong next week, CT head non contrast ( creatinine is elevated) , tylenol bid and Aggrenox bid.  Cardiac monitor for 2-3 weeks did not verify atrial fib.   Asencion Partridge Luverne Farone MD 12/20/2015   Please see below the last office note as written by Dr. Erlinda Hong. CC Dr Berton Mount             STROKE NEUROLOGY FOLLOW UP NOTE  NAME: MAKAYLAN STILTNER DOB: 1940/08/08  REASON FOR VISIT: stroke follow up HISTORY FROM: chart and brother  Today we had the pleasure of seeing DELOYCE LAZAROFF in follow-up at our Neurology Clinic. Pt was accompanied by brother.   History Summary Ms. JOUD BUSTILLO is a 75 y.o. female with history of hypertension, hyperlipidemia, and previous stroke ( by MRI ) admitted on 09/21/15 for dizziness and unstable gait. found to have UTI and dehydration, received Abx. MRI showed tiny acute right ACA infarct. CTA head and neck showed severe intracranial stenosis involving b/l terminus ICA, b/l MCA M1 and bifurcation and attenuated right MCA branches. DVT, TTE negative. LDL 63 and A1C 6.0. She was put on DAPT for 3 months and recommend 30 day cardiac event monitoring on discharge.   Interval History During the interval time, the patient has been doing fair.  She went to ED on 09/28/15 for intermittent lightheadedness when walking and intermittent tremors for 2 days. Found again to have dehydration and UTI. Given IV bolus and Abx. ED note stated that " Patient had a recent diagnosis of atrial fibrillation" and "Patient also seemed to be in atrial fibrillation on cardiac monitor intermittently", for which I am not aware of. Her tele strip did show MAT, but no afib. She has cardiology appointment on 11/06/15. BP today 104/70. Brother stated that she did not drink water very well in NH. She walks with PT/OT but still more in the wheelchair not able to walk yet.   REVIEW OF SYSTEMS: Full 14 system review of systems performed and notable only for those listed below and in HPI above, all others are negative:  Constitutional:  fatigue Cardiovascular:  Ear/Nose/Throat:  Hearing loss, runny nose, trouble swallowing Skin: moles, itching Eyes:  Loss of vision Respiratory:   Gastroitestinal:  Swollen abdomen, constipation Genitourinary: frequent urination, urgency Hematology/Lymphatic:  Bruising easily Endocrine:  Musculoskeletal:   Back pain, walking difficulty Allergy/Immunology:  Frequent infection Neurological:  Weakness in legs Psychiatric:  Sleep: daytime sleepiness  The following represents the patient's updated allergies and side effects list: No Known Allergies  The neurologically relevant items on the patient's problem list were reviewed on today's visit.  Neurologic Examination  A problem focused neurological exam (12 or more points of the single system neurologic examination, vital signs counts as 1 point, cranial nerves count for 8 points) was performed.  Blood pressure (!) 86/52, Beard 68, resp. rate 20.  General - Well nourished, well developed, in no apparent distress.  Ophthalmologic - Sharp disc margins OU.   Cardiovascular - Regular rate and rhythm,  No afib rhythm.   Mental Status -  Level of arousal and orientation to time, place, and person were intact. Language including expression, naming, repetition, comprehension was assessed and found intact. Fund of Knowledge was assessed and was intact.  Cranial Nerves II - XII - II - Visual field intact OU. III, IV, VI - Extraocular movements intact V - Facial sensation intact bilaterally. VII - Facial movement intact bilaterally. VIII -  Hearing & vestibular intact bilaterally. X - Palate elevates symmetrically. XI - Chin turning & shoulder shrug intact bilaterally. XII - Tongue protrusion intact.  Motor Strength - The patient's strength was 4+/5 in all extremities and pronator drift was absent.  Bulk was normal and fasciculations were absent.   Motor Tone - Muscle tone was assessed at the neck and appendages and was normal.  Reflexes - The patient's reflexes were 1+ in all extremities and she had no pathological reflexes.  Sensory - Light touch, temperature/pinprick were assessed and were normal.    Coordination - The patient had normal movements in the hands with no ataxia or dysmetria.  Tremor was absent.  Gait and Station - in  wheelchair, not tested.   Functional score  mRS = 3   0 - No symptoms.   1 - No significant disability. Able to carry out all usual activities, despite some symptoms.   2 - Slight disability. Able to look after own affairs without assistance, but unable to carry out all previous activities.   3 - Moderate disability. Requires some help, but able to walk unassisted.   4 - Moderately severe disability. Unable to attend to own bodily needs without assistance, and unable to walk unassisted.   5 - Severe disability. Requires constant nursing care and attention, bedridden, incontinent.   6 - Dead.   NIH Stroke Scale = 0   Data reviewed: I personally reviewed the images and agree with the radiology interpretations.  Dg Chest 2 View 09/21/2015   COPD.  No evidence of pneumothorax or other acute findings. Aortic atherosclerosis noted.   Ct Head Wo Contrast 09/21/2015   No acute intracranial abnormalities. Generalized atrophy. Mega cisterna magna.   Mr Brain Wo Contrast 09/21/2015   Patient was not able to complete the examination. Sequences obtained are motion degraded. Question tiny acute infarct medial aspect posterior right frontal -parietal lobe junction. Remote infarct superior vermis. No intracranial hemorrhage. Mild chronic microvascular changes. Moderate global atrophy without hydrocephalus.   Mr Jodene Nam Head/brain Wo Cm 09/22/2015   Severely limited study due to motion artifact. No large or proximal arterial branch occlusion. No definite high-grade or correctable stenosis, although evaluation is fairly limited.   Ct Angio Head & Neck W Or Wo Contrast 09/24/2015   1. Negative for emergent large vessel occlusion but positive for SEVERE intracranial arterial stenoses: - Left ICA terminus (CRITICAL stenosis - see series 604 image 88), - Right MCA distal M1 and bifurcation (SEVERE stenoses) with attenuated Right MCA branches. 2. Atherosclerosis with mild to moderate stenosis of the  right ICA siphon and left MCA M1 segment. 3. No extracranial arterial stenosis. Tortuous cervical carotid and vertebral arteries. 4. Stable CT appearance of the brain. 5. Small layering left pleural effusion. Apical pulmonary scarring greater on the right.   2D Echocardiogram  - Left ventricle: The cavity size was normal. Wall thickness was increased in a pattern of mild LVH. Systolic function was normal. The estimated ejection fraction was in the range of 50% to 55%. Features are consistent with a pseudonormal left ventricular filling pattern, with concomitant abnormal relaxation and increased filling pressure (grade 2 diastolic dysfunction). - Aortic valve: Mildly calcified annulus. Mildly thickened, mildly calcified leaflets. There was trivial regurgitation. - Mitral valve: Mildly calcified annulus. Mildly thickened leaflets  LE venous doppler - Negative for deep and superficial vein thrombosis in both leg  Component     Latest Ref Rng & Units 08/23/2015 09/24/2015 09/25/2015  Triglycerides  40 - 160 mg/dL 222 (A)    Cholesterol     0 - 200 mg/dL 180    HDL Cholesterol     35 - 70 mg/dL 73 (A)    LDL (calc)     mg/dL 63    Hemoglobin A1C      6.0    Sed Rate     0 - 22 mm/hr  45 (H)   CRP     <1.0 mg/dL  1.1 (H)   TSH     0.350 - 4.500 uIU/mL   2.008    Assessment: As you may recall, she is a 75 y.o. Caucasian female with PMH of hypertension, hyperlipidemia admitted on 09/21/15 for dizziness and unstable gait. found to have UTI and dehydration, received Abx. MRI showed tiny acute right ACA infarct. CTA head and neck showed severe intracranial stenosis involving b/l terminus ICA, b/l MCA M1 and bifurcation and attenuated right MCA branches. DVT, TTE negative. LDL 63 and A1C 6.0. Stroke considered due to dehydration and infection in the setting of intracranial stenosis. She was put on DAPT for 3 months and recommend 30 day cardiac event monitoring on discharge. She went to ED on 09/28/15  for intermittent lightheadedness when walking and intermittent tremors for 2 days. Found again to have dehydration and UTI. Given IV bolus and Abx. ED note stated that " Patient had a recent diagnosis of atrial fibrillation" and "Patient also seemed to be in atrial fibrillation on cardiac monitor intermittently", for which I am not aware of. Her tele strip did show MAT, but no afib. She has cardiology appointment on 11/06/15. She walks with PT/OT but still largely in the wheelchair not able to walk yet likely due to de-conditioning.    Plan:  - continue ASA and plavix for another 2 months and then plavix alone - continue lipitor for stroke prevention.  - follow up with cardiology next week regarding atrial arrhythmia  - will order 30 day cardiac event monitoring  - Follow up with your primary care physician for stroke risk factor modification. Recommend maintain blood pressure goal <130/80, diabetes with hemoglobin A1c goal below 7.0% and lipids with LDL cholesterol goal below 70 mg/dL.  - will recommend to discontinue amlodipine due to low BP but continue metoprolol for arrhythmia. - check BP at facility and record and bring over to cardiology follow up - continue work with PT/OT in facility  - severe intracranial vessel stenosis, please keep hydrated and avoid low BP - follow up in 3 months.   I spent more than 25 minutes of face to face time with the patient. Greater than 50% of time was spent in counseling and coordination of care. We discussed keep hydration and BP management, cardiac monitoring and cardiology follow up as well as PT/OT.   No orders of the defined types were placed in this encounter.   No orders of the defined types were placed in this encounter.   There are no Patient Instructions on file for this visit.  Rosalin Hawking, MD PhD [facility] [address] [place], [place] [address] [phone]

## 2015-12-20 NOTE — Telephone Encounter (Signed)
[facility] called in to make appt for pt. She has an existing appt in Nov. I have placed her on a wait list as well. While speaking with him he stated that her over all health has declined, she is having more frequent headaches , confused , left side weakness. Pt has been complaining of ha's frequently. Weakness was noticed two days ago by PT. I have told Jefm Bryant that a note will be sent to the nurse and physician about the pt but if a significant change has been seen to take her to the ER. Jefm Bryant stated their director wants her to be seen by a neurologist. Again I told him that if a noticeable and significant change has been seen to take her to the ER.  Phone: [phone]- Jefm Bryant

## 2015-12-20 NOTE — Patient Instructions (Addendum)
Stroke Prevention Some health problems and behaviors may make it more likely for you to have a stroke. Below are ways to lessen your risk of having a stroke.   Be active for at least 30 minutes on most or all days.  Do not smoke. Try not to be around others who smoke.  Do not drink too much alcohol.  Do not have more than 2 drinks a day if you are a man.  Do not have more than 1 drink a day if you are a woman and are not pregnant.  Eat healthy foods, such as fruits and vegetables. If you were put on a specific diet, follow the diet as told.  Keep your cholesterol levels under control through diet and medicines. Look for foods that are low in saturated fat, trans fat, cholesterol, and are high in fiber.  If you have diabetes, follow all diet plans and take your medicine as told.  Ask your doctor if you need treatment to lower your blood pressure. If you have high blood pressure (hypertension), follow all diet plans and take your medicine as told by your doctor.  If you are 23-53 years old, have your blood pressure checked every 3-5 years. If you are age 77 or older, have your blood pressure checked every year.  Keep a healthy weight. Eat foods that are low in calories, salt, saturated fat, trans fat, and cholesterol.  Do not take drugs.  Avoid birth control pills, if this applies. Talk to your doctor about the risks of taking birth control pills.  Talk to your doctor if you have sleep problems (sleep apnea).  Take all medicine as told by your doctor.  You may be told to take aspirin or blood thinner medicine. Take this medicine as told by your doctor.  Understand your medicine instructions.  Make sure any other conditions you have are being taken care of. GET HELP RIGHT AWAY IF:  You suddenly lose feeling (you feel numb) or have weakness in your face, arm, or leg.  Your face or eyelid hangs down to one side.  You suddenly feel confused.  You have trouble talking (aphasia)  or understanding what people are saying.  You suddenly have trouble seeing in one or both eyes.  You suddenly have trouble walking.  You are dizzy.  You lose your balance or your movements are clumsy (uncoordinated).  You suddenly have a very bad headache and you do not know the cause.  You have new chest pain.  Your heart feels like it is fluttering or skipping a beat (irregular heartbeat). Do not wait to see if the symptoms above go away. Get help right away. Call your local emergency services (911 in [place] Do not drive yourself to the hospital.   This information is not intended to replace advice given to you by your health care provider. Make sure you discuss any questions you have with your health care provider.   Document Released: 09/09/2011 Document Revised: 03/31/2014 Document Reviewed: 09/10/2012 Elsevier Interactive Patient Education 2016 [place] are dehydrated-  You need to drink a large cup or glass of water at least 4 times a day. This will elevate your blood pressure as you're currently very low at 86/52. It is likely that the dehydration continues to produce a more confused and lethargic state. In a patient with hardening of the artery higher blood pressure as needed to allow perfusion and circulation.

## 2015-12-20 NOTE — Telephone Encounter (Signed)
I spoke to Dr. Brett Fairy. She says that pt should start the aggrenox BID tomorrow, since the pt took the plavix today, and to stop the plavix.  I called Erica. I advised her of this information. She says that [facility] will stop the plavix and start the pt on aggrenox BID. This is correct per pt's medication list in EPIC at this time.

## 2015-12-21 ENCOUNTER — Ambulatory Visit
Admission: RE | Admit: 2015-12-21 | Discharge: 2015-12-21 | Disposition: A | Discharge status: Home / Self Care | Payer: Medicare Other | Source: Ambulatory Visit | Attending: Neurology | Admitting: Neurology

## 2015-12-21 ENCOUNTER — Telehealth: Payer: Self-pay | Admitting: Neurology

## 2015-12-21 DIAGNOSIS — I639 Cerebral infarction, unspecified: Secondary | ICD-10-CM | POA: Diagnosis not present

## 2015-12-21 DIAGNOSIS — I63522 Cerebral infarction due to unspecified occlusion or stenosis of left anterior cerebral artery: Secondary | ICD-10-CM

## 2015-12-21 DIAGNOSIS — H539 Unspecified visual disturbance: Secondary | ICD-10-CM

## 2015-12-21 DIAGNOSIS — I63233 Cerebral infarction due to unspecified occlusion or stenosis of bilateral carotid arteries: Secondary | ICD-10-CM | POA: Diagnosis not present

## 2015-12-21 DIAGNOSIS — I69398 Other sequelae of cerebral infarction: Principal | ICD-10-CM

## 2015-12-21 NOTE — Telephone Encounter (Signed)
I received a call to [facility], it was regarding a clarification on an order by Dr. Brett Fairy. By the time I called back, they had resolved the problem.

## 2015-12-24 ENCOUNTER — Telehealth: Payer: Self-pay

## 2015-12-24 NOTE — Telephone Encounter (Signed)
I spoke to pt's brother, Gwyndolyn Saxon, per DPR, and advised him that pt's CT head results were reviewed by Dr. Brett Fairy and she wanted me to inform them that there was no new stroke noted. 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 Pt's brother verbalized understanding of results. Pt's brother had no questions at this time but was encouraged to call back if questions arise.

## 2015-12-24 NOTE — Telephone Encounter (Signed)
-----   Message from Larey Seat, MD sent at 12/24/2015  1:56 PM EDT ----- Please call sister and tell her results, NO NEW STROKE. Congenital abnormalities.

## 2015-12-27 ENCOUNTER — Non-Acute Institutional Stay (SKILLED_NURSING_FACILITY): Payer: Medicare Other | Admitting: Internal Medicine

## 2015-12-27 ENCOUNTER — Encounter: Payer: Self-pay | Admitting: Internal Medicine

## 2015-12-27 DIAGNOSIS — K5130 Ulcerative (chronic) rectosigmoiditis without complications: Secondary | ICD-10-CM

## 2015-12-27 DIAGNOSIS — A498 Other bacterial infections of unspecified site: Secondary | ICD-10-CM

## 2015-12-27 DIAGNOSIS — N179 Acute kidney failure, unspecified: Secondary | ICD-10-CM

## 2015-12-27 DIAGNOSIS — R42 Dizziness and giddiness: Secondary | ICD-10-CM | POA: Diagnosis not present

## 2015-12-27 DIAGNOSIS — I10 Essential (primary) hypertension: Secondary | ICD-10-CM | POA: Diagnosis not present

## 2015-12-27 DIAGNOSIS — M353 Polymyalgia rheumatica: Secondary | ICD-10-CM

## 2015-12-27 DIAGNOSIS — I5042 Chronic combined systolic (congestive) and diastolic (congestive) heart failure: Secondary | ICD-10-CM

## 2015-12-27 DIAGNOSIS — I6300 Cerebral infarction due to thrombosis of unspecified precerebral artery: Secondary | ICD-10-CM

## 2015-12-27 NOTE — Progress Notes (Signed)
Progress Note    Location:  [facility] Room Number: [id number] Place of Service:  SNF [phone]) Provider:   Jeanmarie Hubert, MD  Patient Care Team: [name], MD as PCP - General (Internal Medicine) [name], MD as Consulting Physician (Cardiology) [name], MD as Consulting Physician (Ophthalmology) [name], MD as Consulting Physician (Gastroenterology) [name], MD as Consulting Physician (Dermatology)  Extended Emergency Contact Information Primary Emergency Contact: [name]) Address: [place], [place] [address] [name] of [place] Phone: [phone] Mobile Phone: [phone] Relation: Brother Secondary Emergency Contact: [name] [place] of [place] Phone: [phone] Relation: Sister  Code Status:  DNR Goals of care: Advanced Directive information Advanced Directives 12/27/2015  Does patient have an advance directive? Yes  Type of Paramedic of [place];Living will;Out of facility DNR (pink MOST or yellow form)  Does patient want to make changes to advanced directive? -  Copy of advanced directive(s) in chart? Yes  Pre-existing out of facility DNR order (yellow form or pink MOST form) Pink MOST form placed in chart (order not valid for inpatient use);Yellow form placed in chart (order not valid for inpatient use)     Chief Complaint  Patient presents with  . Medical Management of Chronic Issues    routine    HPI:  Pt is a 75 y.o. female seen today for medical management of chronic diseases.    Cerebrovascular accident (CVA) due to thrombosis of precerebral artery ([place]) - known SVD. Residual weakness and paresthesias in the left arm and hand. Vision declined.  Disequilibrium - requiring assistance of others withstanding and waking safely.  Essential hypertension - controlled  Polymyalgia rheumatica (HCC) - continues low dose  prednisone 5 mg qd.  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 Received notifiacation from insurance that it is time to schedule appt for follow up colonoscopy. Her recent illnesses cause me to think this is not a good test at this time.   Acute kidney injury ([place]) - improving sinc her hospitalization.  Chronic combined systolic and diastolic CHF (congestive heart failure) ([place]) - compensated  Citrobacter infection - cause of the last hospitalization. Resolved. At risk for recurrent UTI.     Past Medical History:  Diagnosis Date  . Colitis   . Ecchymosis 11/15/2015  . GERD (gastroesophageal reflux disease)   . Heart murmur   . Hyperlipidemia   . Hypertension   . Osteopenia    s/p 7 yrs Fosamax  . Polio   . Polymyalgia ([place])   . Rheumatic fever   . Sleep disorder   . Stroke ([place])   . Ulcerative colitis    Past Surgical History:  Procedure Laterality Date  . BREAST LUMPECTOMY  1986   left; benign  . CATARACT EXTRACTION     Left eye   . COLONOSCOPY  2006  . Colonosopy  07/05/13   Dr. Deatra Ina  . DILATION AND CURETTAGE OF UTERUS  12/1999  . MELANOMA EXCISION  2007   left arm  . NECK SURGERY  1986   Benign growth on neck removed  . TONSILLECTOMY      No Known Allergies    Medication List       Accurate as of 12/27/15  9:40 AM. Always use your most recent med list.          acetaminophen 325 MG tablet Commonly known as:  TYLENOL Take 650 mg by mouth every 6 (six) hours as needed.   ARTIFICIAL TEAR SOLUTION OP Apply 1 drop to eye daily as needed.   atorvastatin 10 MG tablet Commonly known as:  LIPITOR Take 1 tablet (10 mg total) by mouth daily at 6 PM.   benazepril-hydrochlorthiazide 20-12.5 MG tablet Commonly known as:  LOTENSIN HCT Take 0.5 tablets by mouth daily.   CALCIUM 600+D PLUS MINERALS 600-400 MG-UNIT Tabs Take 2 tablets by mouth 2 (two) times daily.   diltiazem 60 MG tablet Commonly known as:  CARDIZEM Take 1  tablet (60 mg total) by mouth every 12 (twelve) hours.   dimenhyDRINATE 50 MG tablet Commonly known as:  DRAMAMINE Take 1 tablet every 6 hours as needed for nausea or dizziness   dipyridamole-aspirin 200-25 MG 12hr capsule Commonly known as:  AGGRENOX Take 1 capsule by mouth 2 (two) times daily.   fluticasone 50 MCG/ACT nasal spray Commonly known as:  FLONASE Place 1 spray into both nostrils daily as needed for allergies or rhinitis. Reported on 09/28/2015   lactose free nutrition Liqd Take 237 mLs by mouth. Drink one can daily   loratadine 10 MG tablet Commonly known as:  CLARITIN Take 10 mg by mouth. Take one tablet daily   mesalamine 1.2 g EC tablet Commonly known as:  LIALDA take 2 tablets by mouth daily with BREAKFAST   metoprolol 50 MG tablet Commonly known as:  LOPRESSOR Take 1 tablet (50 mg total) by mouth 2 (two) times daily.   multivitamin tablet Take 1 tablet by mouth daily.   predniSONE 5 MG tablet Commonly known as:  DELTASONE TWO TABLETs DAILY TO TREAT POLYMYALGIA RHEUMATICA   ranitidine 150 MG tablet Commonly known as:  ZANTAC Take 150 mg by mouth at bedtime. Take one tablet at bedtime   sennosides-docusate sodium 8.6-50 MG tablet Commonly known as:  SENOKOT-S Take 1 tablet by mouth 3 (three) times daily. Take one tablet three times daily for constipation-hold for loose stools       Review of Systems  Constitutional: Negative for activity change, appetite change, chills, diaphoresis, fatigue, fever and unexpected weight change.  HENT: Negative for congestion, ear discharge, ear pain, hearing loss, postnasal drip, rhinorrhea, sore throat, tinnitus, trouble swallowing and voice change.   Eyes: Positive for visual disturbance (corrective lenses). Negative for pain, redness and itching.  Respiratory: Negative for cough, choking, shortness of breath and wheezing.   Cardiovascular: Negative for chest pain, palpitations and leg swelling.  Gastrointestinal:  Positive for diarrhea, nausea and vomiting. Negative for abdominal distention, abdominal pain and constipation.       Hx ulcerative colitis; on [place].  Endocrine: Negative for cold intolerance, heat intolerance, polydipsia, polyphagia and polyuria.       History of elevated glucose.  Genitourinary: Negative for difficulty urinating, dysuria, flank pain, frequency, hematuria, pelvic pain, urgency and vaginal discharge.  Musculoskeletal: Positive for back pain and gait problem. Negative for arthralgias, myalgias, neck pain and neck stiffness.       Hx PMR and chronic use of prednisone. History of osteopenia and kyphosis  Skin: Negative for color change, pallor and rash.  Allergic/Immunologic: Negative.   Neurological: Positive for dizziness and light-headedness. Negative for tremors, seizures, syncope, weakness, numbness and headaches.       History of poliomyelitis at age 68. History of CVA.09/21/15.  Hematological: Negative for adenopathy. Does not bruise/bleed easily.  Psychiatric/Behavioral: Negative for agitation, behavioral problems, confusion, dysphoric mood, hallucinations, sleep disturbance and suicidal ideas. The patient  is not nervous/anxious and is not hyperactive.     Immunization History  Administered Date(s) Administered  . Hep A / Hep B 06/08/2013, 06/15/2013, 06/29/2013, 06/12/2014  . Influenza Split 01/01/2014  . Influenza Whole 01/17/2009, 12/02/2010  . Influenza-Unspecified 12/22/2012, 12/22/2013, 12/21/2014  . Pneumococcal Conjugate-13 01/27/2015  . Pneumococcal Polysaccharide-23 07/25/2009  . Td 07/25/2009  . Tdap 12/22/2011   Pertinent  Health Maintenance Due  Topic Date Due  . MAMMOGRAM  07/18/2015  . INFLUENZA VACCINE  10/23/2015  . DEXA SCAN  Completed  . PNA vac Low Risk Adult  Completed   Fall Risk  11/02/2015 05/01/2015 01/23/2015 07/20/2014 04/20/2013  Falls in the past year? No No No No No   Functional Status Survey:    Vitals:   12/27/15 0924  BP:  (!) 145/71  Pulse: 62  Resp: 18  Temp: 99.5 F (37.5 C)  SpO2: 98%  Weight: 120 lb (54.4 kg)  Height: 5\' 1"  (1.549 m)   Body mass index is 22.67 kg/m. Physical Exam  Constitutional: She is oriented to person, place, and time. She appears well-developed and well-nourished. No distress.  HENT:  Right Ear: External ear normal.  Left Ear: External ear normal.  Nose: Nose normal.  Mouth/Throat: Oropharynx is clear and moist. No oropharyngeal exudate.  Wearing hearing aids bilaterally. EAC and TM normal bilaterally.  Eyes: Conjunctivae and EOM are normal. Pupils are equal, round, and reactive to light. No scleral icterus.  Prescription lenses  Neck: No JVD present. No tracheal deviation present. No thyromegaly present.  Cardiovascular: Normal rate, regular rhythm, normal heart sounds and intact distal pulses.  Exam reveals no gallop and no friction rub.   No murmur heard. Pulmonary/Chest: Effort normal. No respiratory distress. She has no wheezes. She has no rales. She exhibits no tenderness.  Abdominal: Soft. Bowel sounds are normal. She exhibits no distension and no mass. There is no tenderness.  Large diastasis recti  Musculoskeletal: Normal range of motion. She exhibits no edema or tenderness.  Kyphosis.. Miild gait instability.  Lymphadenopathy:    She has no cervical adenopathy.  Neurological: She is alert and oriented to person, place, and time. No cranial nerve deficit. Coordination normal.  Mild tremor of extended arms  Skin: No rash noted. She is not diaphoretic. No erythema. No pallor.  Large ecchymoses present on the posterior leg and buttocks. No known trauma.  Psychiatric: She has a normal mood and affect. Her behavior is normal. Judgment and thought content normal.    Labs reviewed:  Recent Labs  11/28/15 0849  11/30/15 1941 12/01/15 0224 12/02/15 1113 12/06/15 12/12/15  NA 131*  < > 127* 132* 134* 134* 135*  K 3.3*  < > 3.3* 3.0* 4.1 4.3 3.9  CL 93*  < > 95*  98* 102  --   --   CO2 27  < > 23 27 27   --   --   GLUCOSE 171*  < > 229* 102* 114*  --   --   BUN 14  < > 14 12 12  30* 30*  CREATININE 0.93  < > 0.97 0.95 1.08* 1.2* 1.0  CALCIUM 8.4*  < > 8.1* 8.2* 8.6*  --   --   MG 2.6*  --  1.8 1.8  --   --   --   < > = values in this interval not displayed.  Recent Labs  11/25/15 0512 11/26/15 1013 11/27/15 0243 12/12/15  AST 31 21 18 13   ALT 20 20 16  14  ALKPHOS 75 64 57 68  BILITOT 1.0 0.8 0.9  --   PROT 7.5 7.3 6.1*  --   ALBUMIN 4.1 4.0 3.4*  --     Recent Labs  09/22/15 0832  09/28/15 1636  11/26/15 1013 11/27/15 0243 12/01/15 0224 12/02/15 1113 12/12/15  WBC 8.7  < > 7.6  < > 13.3* 11.5* 6.5 6.9 4.8  NEUTROABS 7.7  --  6.9  --  11.3*  --   --   --   --   HGB 11.8*  < > 12.7  < > 13.4 12.0 9.8* 10.8* 11.2*  HCT 37.4  < > 40.3  < > 39.1 36.4 30.9* 34.8* 35*  MCV 91.2  < > 93.3  < > 88.1 89.4 92.0 94.1  --   PLT 178  < > 212  < > 209 163 136* 150 205  < > = values in this interval not displayed. Lab Results  Component Value Date   TSH 2.008 09/25/2015   Lab Results  Component Value Date   HGBA1C 6.0 08/23/2015   Lab Results  Component Value Date   CHOL 180 08/23/2015   HDL 73 (A) 08/23/2015   LDLCALC 63 08/23/2015   TRIG 222 (A) 08/23/2015   CHOLHDL 3 11/14/2008    Significant Diagnostic Results in last 30 days:  Ct Head Wo Contrast  Result Date: 12/24/2015  [facility] [address] [place], [place] [address] [phone] NEUROIMAGING REPORT STUDY DATE: 12/21/2015 PATIENT NAME: FARHIYA VANNATTER DOB: 08-Jul-1940 MRN: [id number] EXAM: CT of the head without contrast ORDERING CLINICIAN: Crosby Oyster M.D. CLINICAL HISTORY: 75 year old woman with history of stroke and visual changes COMPARISON FILMS: CT night 07/12/2015 TECHNIQUE: CT scan of the head was obtained utilizing 5 mm axial slices from the skull base to the vertex. CONTRAST: None IMAGING SITE: [facility],  [place]  FINDINGS: The fourth ventricle is enlarged in association with omega cisterna magna. 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 No intracranial hemorrhage.  No evidence of mass effect or midline shift.  The cerebellum and vermis appears under developed. The brainstem appears normal. The deep gray matter appears normal. There are some hyperdense signal changes in the periventricular deep white matter consistent with chronic microvascular ischemic change. None of these appear to be acute. There does not appear to be any change when compared to the 11/26/2015 CT scan.. The orbits and their contents, imaged paranasal sinuses and calvarium are unremarkable.     This CT scan of the head without contrast shows the following: 1.    Mild atrophy and mild chronic microvascular ischemic changes related to aging and unchanged when compared to the night 07/12/2015 CT scan. 2.    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    Also seen on prior CT scan and MRI from 09/21/2015. 3.    There are no acute findings. INTERPRETING PHYSICIAN: Richard A. Felecia Shelling, MD, PhD Certified in  Neuroimaging by [employer]    Assessment/Plan 1. Cerebrovascular accident (CVA) due to thrombosis of precerebral artery ([place]) In addition to her documented CVA, I think she has had additional neurologic events of TIA or RIND related to her SVD.  2. Disequilibrium Continue with PT and OT  3. Essential hypertension controlled  4. Polymyalgia rheumatica (HCC) Stable on 5 mg prednisone  5. 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 Defer further screening colonoscopy until stable and stronger  6. Acute kidney injury ([place]) Follow lab  7. Chronic combined systolic and diastolic CHF (congestive heart failure) ([place]) conpensated  8. Citrobacter  infection resolved   Brother at bedside today. At least 30 minutes was spent with patient and her brother reviewing prior hospitalizations and scans.

## 2016-01-01 ENCOUNTER — Encounter: Payer: Self-pay | Admitting: Nurse Practitioner

## 2016-01-01 ENCOUNTER — Non-Acute Institutional Stay (SKILLED_NURSING_FACILITY): Payer: Medicare Other | Admitting: Nurse Practitioner

## 2016-01-01 DIAGNOSIS — I5042 Chronic combined systolic (congestive) and diastolic (congestive) heart failure: Secondary | ICD-10-CM | POA: Diagnosis not present

## 2016-01-01 DIAGNOSIS — I6300 Cerebral infarction due to thrombosis of unspecified precerebral artery: Secondary | ICD-10-CM

## 2016-01-01 DIAGNOSIS — I10 Essential (primary) hypertension: Secondary | ICD-10-CM | POA: Diagnosis not present

## 2016-01-01 DIAGNOSIS — R350 Frequency of micturition: Secondary | ICD-10-CM

## 2016-01-01 DIAGNOSIS — M353 Polymyalgia rheumatica: Secondary | ICD-10-CM | POA: Diagnosis not present

## 2016-01-01 DIAGNOSIS — K5100 Ulcerative (chronic) pancolitis without complications: Secondary | ICD-10-CM | POA: Diagnosis not present

## 2016-01-01 DIAGNOSIS — K219 Gastro-esophageal reflux disease without esophagitis: Secondary | ICD-10-CM

## 2016-01-01 NOTE — Assessment & Plan Note (Signed)
12/07/15 Na 134, K 4.3, Bun 30, creat 1.15 Continue Metoprolol 50mg , Diltiazem 60mg  bid, Lotensin 20/12.5mg 

## 2016-01-01 NOTE — Assessment & Plan Note (Signed)
Stable, continue Ranitidine.  

## 2016-01-01 NOTE — Assessment & Plan Note (Signed)
Managed with Prednisone 5mg daily 

## 2016-01-01 NOTE — Assessment & Plan Note (Signed)
Will try [name] 25mg  daily. Observe the patient.

## 2016-01-01 NOTE — Assessment & Plan Note (Signed)
Compensated clinically.  

## 2016-01-01 NOTE — Assessment & Plan Note (Addendum)
Hx of CVA 08/2015. Vision disturbance, HA, left sided weakness. Fall 12/30/15 w/o apparent injury. Close supervision for safety

## 2016-01-01 NOTE — Assessment & Plan Note (Signed)
Left-sided colitis, in remission, on lialda, diagnosed 2006 Continue Lialda.

## 2016-01-01 NOTE — Progress Notes (Signed)
Location:  [facility] Room Number: 34 Place of Service:  SNF (31) Provider: Kristofor Michalowski, [place] NP  Tammy Hubert, MD  Patient Care Team: [name], MD as PCP - General (Internal Medicine) [name], MD as Consulting Physician (Cardiology) [name], MD as Consulting Physician (Ophthalmology) [name], MD as Consulting Physician (Gastroenterology) [name], MD as Consulting Physician (Dermatology)  Extended Emergency Contact Information Primary Emergency Contact: [name]) Address: [place], [place] [address] Tammy Beard Phone: [phone] Mobile Phone: [phone] Relation: Brother Secondary Emergency Contact: Tammy Beard States of [place] Phone: [phone] Relation: Sister  Code Status:  DNR  Goals of care: Advanced Directive information Advanced Directives 01/01/2016  Does patient have an advance directive? Yes  Type of Paramedic of [place];Living will;Out of facility DNR (pink MOST or yellow form)  Does patient want to make changes to advanced directive? No - Patient declined  Copy of advanced directive(s) in chart? Yes  Pre-existing out of facility DNR order (yellow form or pink MOST form) -     Chief Complaint  Patient presents with  . Acute Visit    [facility] on 10/8    HPI:  Pt is a 75 y.o. female seen today for an acute visit for s/p fall, no apparent injury. Hx of urinary frequency, no dysuria or suprapubic pain or lower back pain.    Recent Hx of poor balance, confusion, left limbs weaker than the right even if muscle strength 5/5, increased gait problem. 11/26/15 CT head No evidence of acute intracranial abnormality. Atrophy and chronic small-vessel white matter ischemic changes. 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              Hospitalized  11/26/15-12/03/15 for Pyelonephritis, Citrobacter Freundii, 7 day course Vantin 200mg  bid to be completed at [facility].    Past Medical History:  Diagnosis Date  . Colitis   . Ecchymosis 11/15/2015  . GERD (gastroesophageal reflux disease)   . Heart murmur   . Hyperlipidemia   . Hypertension   . Osteopenia    s/p 7 yrs Fosamax  . Polio   . Polymyalgia ([place])   . Rheumatic fever   . Sleep disorder   . Stroke ([place])   . Ulcerative colitis    Past Surgical History:  Procedure Laterality Date  . BREAST LUMPECTOMY  1986   left; benign  . CATARACT EXTRACTION     Left eye   . COLONOSCOPY  2006  . Colonosopy  07/05/13   Dr. Deatra Ina  . DILATION AND CURETTAGE OF UTERUS  12/1999  . MELANOMA EXCISION  2007   left arm  . NECK SURGERY  1986   Benign growth on neck removed  . TONSILLECTOMY      No Known Allergies    Medication List       Accurate as of 01/01/16  6:29 PM. Always use your most recent med list.          acetaminophen 325 MG tablet Commonly known as:  TYLENOL Take 650 mg by mouth every 6 (six) hours as needed.   ARTIFICIAL TEAR SOLUTION OP Apply 1 drop to eye daily as needed.   atorvastatin 10 MG tablet Commonly known as:  LIPITOR Take 1 tablet (10 mg total) by mouth daily at 6 PM.  benazepril-hydrochlorthiazide 20-12.5 MG tablet Commonly known as:  LOTENSIN HCT Take 0.5 tablets by mouth daily.   CALCIUM 600+D PLUS MINERALS 600-400 MG-UNIT Tabs Take 2 tablets by mouth 2 (two) times daily.   diltiazem 60 MG tablet Commonly known as:  CARDIZEM Take 1 tablet (60 mg total) by mouth every 12 (twelve) hours.   dimenhyDRINATE 50 MG tablet Commonly known as:  DRAMAMINE Take 1 tablet every 6 hours as needed for nausea or dizziness   dipyridamole-aspirin 200-25 MG 12hr capsule Commonly known as:  AGGRENOX Take 1 capsule by mouth 2 (two) times daily.   fluticasone 50 MCG/ACT nasal spray Commonly known as:  FLONASE Place 1 spray into both nostrils daily as needed  for allergies or rhinitis. Reported on 09/28/2015   lactose free nutrition Liqd Take 237 mLs by mouth. Drink one can daily   loratadine 10 MG tablet Commonly known as:  CLARITIN Take 10 mg by mouth. Take one tablet daily   mesalamine 1.2 g EC tablet Commonly known as:  LIALDA take 2 tablets by mouth daily with BREAKFAST   metoprolol 50 MG tablet Commonly known as:  LOPRESSOR Take 1 tablet (50 mg total) by mouth 2 (two) times daily.   multivitamin tablet Take 1 tablet by mouth daily.   predniSONE 5 MG tablet Commonly known as:  DELTASONE TWO TABLETs DAILY TO TREAT POLYMYALGIA RHEUMATICA   ranitidine 150 MG tablet Commonly known as:  ZANTAC Take 150 mg by mouth at bedtime. Take one tablet at bedtime   sennosides-docusate sodium 8.6-50 MG tablet Commonly known as:  SENOKOT-S Take 1 tablet by mouth 3 (three) times daily. Take one tablet three times daily for constipation-hold for loose stools       Review of Systems  Constitutional: Negative for activity change, appetite change, chills, diaphoresis, fatigue, fever and unexpected weight change.  HENT: Negative for congestion, ear discharge, ear pain, hearing loss, postnasal drip, rhinorrhea, sore throat, tinnitus, trouble swallowing and voice change.   Eyes: Positive for visual disturbance (corrective lenses). Negative for pain, redness and itching.       Old CVA  Respiratory: Negative for cough, choking, shortness of breath and wheezing.   Cardiovascular: Negative for chest pain, palpitations and leg swelling.  Gastrointestinal: Positive for diarrhea, nausea and vomiting. Negative for abdominal distention, abdominal pain and constipation.       Hx ulcerative colitis; on Lialda.  Endocrine: Negative for cold intolerance, heat intolerance, polydipsia, polyphagia and polyuria.       History of elevated glucose.  Genitourinary: Negative for difficulty urinating, dysuria, flank pain, frequency, hematuria, pelvic pain, urgency and  vaginal discharge.  Musculoskeletal: Positive for back pain and gait problem. Negative for arthralgias, myalgias, neck pain and neck stiffness.       Hx PMR and chronic use of prednisone. History of osteopenia and kyphosis  Skin: Negative for color change, pallor and rash.  Allergic/Immunologic: Negative.   Neurological: Positive for dizziness and light-headedness. Negative for tremors, seizures, syncope, weakness, numbness and headaches.       History of poliomyelitis at age 79. History of CVA.09/21/15.  Hematological: Negative for adenopathy. Does not bruise/bleed easily.  Psychiatric/Behavioral: Negative for agitation, behavioral problems, confusion, dysphoric mood, hallucinations, sleep disturbance and suicidal ideas. The patient is not nervous/anxious and is not hyperactive.     Immunization History  Administered Date(s) Administered  . Hep A / Hep B 06/08/2013, 06/15/2013, 06/29/2013, 06/12/2014  . Influenza Split 01/01/2014  . Influenza Whole 01/17/2009, 12/02/2010  . Influenza-Unspecified 12/22/2012,  12/22/2013, 12/21/2014  . Pneumococcal Conjugate-13 01/27/2015  . Pneumococcal Polysaccharide-23 07/25/2009  . Td 07/25/2009  . Tdap 12/22/2011   Pertinent  Health Maintenance Due  Topic Date Due  . MAMMOGRAM  07/18/2015  . INFLUENZA VACCINE  10/23/2015  . DEXA SCAN  Completed  . PNA vac Low Risk Adult  Completed   Fall Risk  11/02/2015 05/01/2015 01/23/2015 07/20/2014 04/20/2013  Falls in the past year? No No No No No   Functional Status Survey:    Vitals:   01/01/16 1438  BP: (!) 168/90  Pulse: 80  Resp: 16  Temp: 98.2 F (36.8 C)  SpO2: 98%  Weight: 120 lb (54.4 kg)  Height: 5\' 1"  (1.549 m)   Body mass index is 22.67 kg/m. Physical Exam  Constitutional: She is oriented to person, place, and time. She appears well-developed and well-nourished. No distress.  HENT:  Right Ear: External ear normal.  Left Ear: External ear normal.  Nose: Nose normal.  Mouth/Throat:  Oropharynx is clear and moist. No oropharyngeal exudate.  Wearing hearing aids bilaterally. EAC and TM normal bilaterally.  Eyes: Conjunctivae and EOM are normal. Pupils are equal, round, and reactive to light. No scleral icterus.  Prescription lenses  Neck: No JVD present. No tracheal deviation present. No thyromegaly present.  Cardiovascular: Normal rate, regular rhythm, normal heart sounds and intact distal pulses.  Exam reveals no gallop and no friction rub.   No murmur heard. Pulmonary/Chest: Effort normal. No respiratory distress. She has no wheezes. She has no rales. She exhibits no tenderness.  Abdominal: Soft. Bowel sounds are normal. She exhibits no distension and no mass. There is no tenderness.  Large diastasis recti  Musculoskeletal: Normal range of motion. She exhibits no edema or tenderness.  Kyphosis.. Miild gait instability.  Lymphadenopathy:    She has no cervical adenopathy.  Neurological: She is alert and oriented to person, place, and time. No cranial nerve deficit. Coordination normal.  Mild tremor of extended arms  Skin: No rash noted. She is not diaphoretic. No erythema. No pallor.  Large ecchymoses present on the posterior leg and buttocks. No known trauma.  Psychiatric: She has a normal mood and affect. Her behavior is normal. Judgment and thought content normal.    Labs reviewed:  Recent Labs  11/28/15 0849  11/30/15 1941 12/01/15 0224 12/02/15 1113 12/06/15 12/12/15  NA 131*  < > 127* 132* 134* 134* 135*  K 3.3*  < > 3.3* 3.0* 4.1 4.3 3.9  CL 93*  < > 95* 98* 102  --   --   CO2 27  < > 23 27 27   --   --   GLUCOSE 171*  < > 229* 102* 114*  --   --   BUN 14  < > 14 12 12  30* 30*  CREATININE 0.93  < > 0.97 0.95 1.08* 1.2* 1.0  CALCIUM 8.4*  < > 8.1* 8.2* 8.6*  --   --   MG 2.6*  --  1.8 1.8  --   --   --   < > = values in this interval not displayed.  Recent Labs  11/25/15 0512 11/26/15 1013 11/27/15 0243 12/12/15  AST 31 21 18 13   ALT 20 20 16  14   ALKPHOS 75 64 57 68  BILITOT 1.0 0.8 0.9  --   PROT 7.5 7.3 6.1*  --   ALBUMIN 4.1 4.0 3.4*  --     Recent Labs  09/22/15 0832  09/28/15 1636  11/26/15 1013 11/27/15 0243 12/01/15 0224 12/02/15 1113 12/12/15  WBC 8.7  < > 7.6  < > 13.3* 11.5* 6.5 6.9 4.8  NEUTROABS 7.7  --  6.9  --  11.3*  --   --   --   --   HGB 11.8*  < > 12.7  < > 13.4 12.0 9.8* 10.8* 11.2*  HCT 37.4  < > 40.3  < > 39.1 36.4 30.9* 34.8* 35*  MCV 91.2  < > 93.3  < > 88.1 89.4 92.0 94.1  --   PLT 178  < > 212  < > 209 163 136* 150 205  < > = values in this interval not displayed. Lab Results  Component Value Date   TSH 2.008 09/25/2015   Lab Results  Component Value Date   HGBA1C 6.0 08/23/2015   Lab Results  Component Value Date   CHOL 180 08/23/2015   HDL 73 (A) 08/23/2015   LDLCALC 63 08/23/2015   TRIG 222 (A) 08/23/2015   CHOLHDL 3 11/14/2008    Significant Diagnostic Results in last 30 days:  Ct Head Wo Contrast  Result Date: 12/24/2015  [facility] [address], [place], [place] [address] [phone] NEUROIMAGING REPORT STUDY DATE: 12/21/2015 PATIENT NAME: ISOBELLE HECKENDORF DOB: [birth year] MRN: [id number] EXAM: CT of the head without contrast ORDERING CLINICIAN: Crosby Oyster M.D. CLINICAL HISTORY: 75 year old woman with history of stroke and visual changes COMPARISON FILMS: CT night 07/12/2015 TECHNIQUE: CT scan of the head was obtained utilizing 5 mm axial slices from the skull base to the vertex. CONTRAST: None IMAGING SITE: [facility],  [place] FINDINGS: The fourth ventricle is enlarged in association with omega cisterna magna. 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 No intracranial hemorrhage.  No evidence of mass effect or midline shift.  The cerebellum and vermis appears under developed. The brainstem appears normal. The deep gray matter appears normal. There are some hyperdense signal changes  in the periventricular deep white matter consistent with chronic microvascular ischemic change. None of these appear to be acute. There does not appear to be any change when compared to the 11/26/2015 CT scan.. The orbits and their contents, imaged paranasal sinuses and calvarium are unremarkable.     This CT scan of the head without contrast shows the following: 1.    Mild atrophy and mild chronic microvascular ischemic changes related to aging and unchanged when compared to the night 07/12/2015 CT scan. 2.    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    Also seen on prior CT scan and MRI from 09/21/2015. 3.    There are no acute findings. INTERPRETING PHYSICIAN: Richard A. Felecia Shelling, MD, PhD Certified in  Neuroimaging by [employer]    Assessment/Plan There are no diagnoses linked to this encounter.   Family/ staff Communication:   Labs/tests ordered:

## 2016-01-23 DIAGNOSIS — Z8612 Personal history of poliomyelitis: Secondary | ICD-10-CM | POA: Diagnosis not present

## 2016-01-23 DIAGNOSIS — A498 Other bacterial infections of unspecified site: Secondary | ICD-10-CM | POA: Diagnosis not present

## 2016-01-23 DIAGNOSIS — R350 Frequency of micturition: Secondary | ICD-10-CM | POA: Diagnosis not present

## 2016-01-23 DIAGNOSIS — E876 Hypokalemia: Secondary | ICD-10-CM | POA: Diagnosis not present

## 2016-01-23 DIAGNOSIS — R2681 Unsteadiness on feet: Secondary | ICD-10-CM | POA: Diagnosis not present

## 2016-01-23 DIAGNOSIS — I471 Supraventricular tachycardia: Secondary | ICD-10-CM | POA: Diagnosis not present

## 2016-01-23 DIAGNOSIS — M353 Polymyalgia rheumatica: Secondary | ICD-10-CM | POA: Diagnosis not present

## 2016-01-23 DIAGNOSIS — I639 Cerebral infarction, unspecified: Secondary | ICD-10-CM | POA: Diagnosis not present

## 2016-01-23 DIAGNOSIS — E785 Hyperlipidemia, unspecified: Secondary | ICD-10-CM | POA: Diagnosis not present

## 2016-01-23 DIAGNOSIS — K219 Gastro-esophageal reflux disease without esophagitis: Secondary | ICD-10-CM | POA: Diagnosis not present

## 2016-01-23 DIAGNOSIS — R002 Palpitations: Secondary | ICD-10-CM | POA: Diagnosis not present

## 2016-01-23 DIAGNOSIS — R42 Dizziness and giddiness: Secondary | ICD-10-CM | POA: Diagnosis not present

## 2016-01-23 DIAGNOSIS — R7303 Prediabetes: Secondary | ICD-10-CM | POA: Diagnosis not present

## 2016-01-23 DIAGNOSIS — E871 Hypo-osmolality and hyponatremia: Secondary | ICD-10-CM | POA: Diagnosis not present

## 2016-01-23 DIAGNOSIS — A419 Sepsis, unspecified organism: Secondary | ICD-10-CM | POA: Diagnosis not present

## 2016-01-23 DIAGNOSIS — R58 Hemorrhage, not elsewhere classified: Secondary | ICD-10-CM | POA: Diagnosis not present

## 2016-01-23 DIAGNOSIS — G479 Sleep disorder, unspecified: Secondary | ICD-10-CM | POA: Diagnosis not present

## 2016-01-23 DIAGNOSIS — R5381 Other malaise: Secondary | ICD-10-CM | POA: Diagnosis not present

## 2016-01-23 DIAGNOSIS — I10 Essential (primary) hypertension: Secondary | ICD-10-CM | POA: Diagnosis not present

## 2016-01-23 DIAGNOSIS — A4150 Gram-negative sepsis, unspecified: Secondary | ICD-10-CM | POA: Diagnosis not present

## 2016-01-23 DIAGNOSIS — M6281 Muscle weakness (generalized): Secondary | ICD-10-CM | POA: Diagnosis not present

## 2016-01-23 DIAGNOSIS — R011 Cardiac murmur, unspecified: Secondary | ICD-10-CM | POA: Diagnosis not present

## 2016-01-23 DIAGNOSIS — I5042 Chronic combined systolic (congestive) and diastolic (congestive) heart failure: Secondary | ICD-10-CM | POA: Diagnosis not present

## 2016-01-23 DIAGNOSIS — N390 Urinary tract infection, site not specified: Secondary | ICD-10-CM | POA: Diagnosis not present

## 2016-01-23 DIAGNOSIS — M6208 Separation of muscle (nontraumatic), other site: Secondary | ICD-10-CM | POA: Diagnosis not present

## 2016-01-28 DIAGNOSIS — A498 Other bacterial infections of unspecified site: Secondary | ICD-10-CM | POA: Diagnosis not present

## 2016-01-28 DIAGNOSIS — M6281 Muscle weakness (generalized): Secondary | ICD-10-CM | POA: Diagnosis not present

## 2016-01-28 DIAGNOSIS — I5042 Chronic combined systolic (congestive) and diastolic (congestive) heart failure: Secondary | ICD-10-CM | POA: Diagnosis not present

## 2016-01-28 DIAGNOSIS — R2681 Unsteadiness on feet: Secondary | ICD-10-CM | POA: Diagnosis not present

## 2016-01-28 DIAGNOSIS — E876 Hypokalemia: Secondary | ICD-10-CM | POA: Diagnosis not present

## 2016-01-28 DIAGNOSIS — A4150 Gram-negative sepsis, unspecified: Secondary | ICD-10-CM | POA: Diagnosis not present

## 2016-01-29 ENCOUNTER — Non-Acute Institutional Stay (SKILLED_NURSING_FACILITY): Payer: Medicare Other | Admitting: Nurse Practitioner

## 2016-01-29 ENCOUNTER — Encounter: Payer: Self-pay | Admitting: Nurse Practitioner

## 2016-01-29 DIAGNOSIS — R350 Frequency of micturition: Secondary | ICD-10-CM

## 2016-01-29 DIAGNOSIS — I10 Essential (primary) hypertension: Secondary | ICD-10-CM

## 2016-01-29 DIAGNOSIS — K5100 Ulcerative (chronic) pancolitis without complications: Secondary | ICD-10-CM | POA: Diagnosis not present

## 2016-01-29 DIAGNOSIS — I471 Supraventricular tachycardia: Secondary | ICD-10-CM

## 2016-01-29 DIAGNOSIS — I6300 Cerebral infarction due to thrombosis of unspecified precerebral artery: Secondary | ICD-10-CM | POA: Diagnosis not present

## 2016-01-29 DIAGNOSIS — M353 Polymyalgia rheumatica: Secondary | ICD-10-CM

## 2016-01-29 DIAGNOSIS — K219 Gastro-esophageal reflux disease without esophagitis: Secondary | ICD-10-CM | POA: Diagnosis not present

## 2016-01-29 DIAGNOSIS — I5042 Chronic combined systolic (congestive) and diastolic (congestive) heart failure: Secondary | ICD-10-CM | POA: Diagnosis not present

## 2016-01-29 NOTE — Assessment & Plan Note (Signed)
Left-sided colitis, in remission, on lialda, diagnosed 2006 Continue Lialda.

## 2016-01-29 NOTE — Assessment & Plan Note (Signed)
Managed with Prednisone 5mg daily 

## 2016-01-29 NOTE — Assessment & Plan Note (Signed)
Stable, continue Ranitidine.  

## 2016-01-29 NOTE — Assessment & Plan Note (Signed)
Hx of CVA 08/2015. Vision disturbance, HA, left sided weakness. Fall 12/30/15

## 2016-01-29 NOTE — Assessment & Plan Note (Signed)
Heart rate is in controlled, continue Metoprolol and Diltiazem.

## 2016-01-29 NOTE — Assessment & Plan Note (Addendum)
No efficacy, dc [name] 25mg  daily. Observe the patient.

## 2016-01-29 NOTE — Progress Notes (Signed)
Location:  [facility] Room Number: 34 Place of Service:  SNF (31) Provider: Betzaida Cremeens, Manxie  NP  Jeanmarie Hubert, MD  Patient Care Team: [name], MD as PCP - General (Internal Medicine) [name], MD as Consulting Physician (Cardiology) [name], MD as Consulting Physician (Ophthalmology) [name], MD as Consulting Physician (Gastroenterology) [name], MD as Consulting Physician (Dermatology)  Extended Emergency Contact Information Primary Emergency Contact: [name]) Address: [place], [place] [address] [name] of [place] Phone: [phone] Mobile Phone: [phone] Relation: Brother Secondary Emergency Contact: [name] States of [place] Phone: [phone] Relation: Sister  Code Status:  DNR Goals of care: Advanced Directive information Advanced Directives 01/29/2016  Does patient have an advance directive? Yes  Type of Paramedic of [place];Living will;Out of facility DNR (pink MOST or yellow form)  Does patient want to make changes to advanced directive? No - Patient declined  Copy of advanced directive(s) in chart? Yes  Pre-existing out of facility DNR order (yellow form or pink MOST form) -     Chief Complaint  Patient presents with  . Medical Management of Chronic Issues    HPI:  Pt is a 75 y.o. female seen today for medical management of chronic diseases.    Hx of poor balance, confusion, left limbs weaker than the right even if muscle strength 5/5, increased gait problem. 11/26/15 CT head No evidence of acute intracranial abnormality. Atrophy and chronic small-vessel white matter ischemic changes. 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 On Aggrenox. [place] Kitchen               Hx pf HTN, controlled on Metoprolol 50mg , Diltiazem 60mg  bid, Lotensin 20/12.5mg  daily. Urinary frequency, no efficacy since [name] 25mg  daily. Stable ulcerative colitis on  Lialda.   Hospitalized 11/26/15-12/03/15 for Pyelonephritis, Citrobacter Freundii, 7 day course Vantin 200mg  bid completed.   Past Medical History:  Diagnosis Date  . Colitis   . Ecchymosis 11/15/2015  . GERD (gastroesophageal reflux disease)   . Heart murmur   . Hyperlipidemia   . Hypertension   . Osteopenia    s/p 7 yrs Fosamax  . Polio   . Polymyalgia ([place])   . Rheumatic fever   . Sleep disorder   . Stroke ([place])   . Ulcerative colitis    Past Surgical History:  Procedure Laterality Date  . BREAST LUMPECTOMY  1986   left; benign  . CATARACT EXTRACTION     Left eye   . COLONOSCOPY  2006  . Colonosopy  07/05/13   Dr. Deatra Ina  . DILATION AND CURETTAGE OF UTERUS  12/1999  . MELANOMA EXCISION  2007   left arm  . NECK SURGERY  1986   Benign growth on neck removed  . TONSILLECTOMY      No Known Allergies    Medication List       Accurate as of 01/29/16  4:45 PM. Always use your most recent med list.          acetaminophen 325 MG tablet Commonly known as:  TYLENOL Take 650 mg by mouth every 6 (six) hours as needed.   ARTIFICIAL TEAR SOLUTION OP Apply 1 drop to eye daily as needed.   atorvastatin 10 MG tablet Commonly known as:  LIPITOR Take 1 tablet (10 mg total) by mouth daily at 6 PM.   benazepril-hydrochlorthiazide 20-12.5 MG tablet Commonly known as:  LOTENSIN HCT Take 0.5 tablets by mouth daily.   CALCIUM 600+D PLUS MINERALS 600-400 MG-UNIT Tabs Take 2 tablets by mouth 2 (two) times daily.   diltiazem 60 MG tablet Commonly known as:  CARDIZEM Take 1 tablet (60 mg total) by mouth every 12 (twelve) hours.   dimenhyDRINATE 50 MG tablet Commonly known as:  DRAMAMINE Take 1 tablet every 6 hours as needed for nausea or dizziness   dipyridamole-aspirin 200-25 MG 12hr capsule Commonly known as:  AGGRENOX Take 1 capsule by mouth 2 (two) times daily.   fluticasone 50 MCG/ACT nasal spray Commonly known as:  FLONASE Place 1 spray into both  nostrils daily as needed for allergies or rhinitis. Reported on 09/28/2015   lactose free nutrition Liqd Take 237 mLs by mouth. Drink one can daily   loratadine 10 MG tablet Commonly known as:  CLARITIN Take 10 mg by mouth. Take one tablet daily   mesalamine 1.2 g EC tablet Commonly known as:  LIALDA take 2 tablets by mouth daily with BREAKFAST   metoprolol 50 MG tablet Commonly known as:  LOPRESSOR Take 1 tablet (50 mg total) by mouth 2 (two) times daily.   multivitamin tablet Take 1 tablet by mouth daily.   MYRBETRIQ 25 MG Tb24 tablet Generic drug:  mirabegron ER Take 25 mg by mouth daily.   predniSONE 5 MG tablet Commonly known as:  DELTASONE TWO TABLETs DAILY TO TREAT POLYMYALGIA RHEUMATICA   ranitidine 150 MG tablet Commonly known as:  ZANTAC Take 150 mg by mouth at bedtime. Take one tablet at bedtime   sennosides-docusate sodium 8.6-50 MG tablet Commonly known as:  SENOKOT-S Take 1 tablet by mouth 3 (three) times daily. Take one tablet three times daily for constipation-hold for loose stools       Review of Systems  Constitutional: Negative for activity change, appetite change, chills, diaphoresis, fatigue, fever and unexpected weight change.  HENT: Negative for congestion, ear discharge, ear pain, hearing loss, postnasal drip, rhinorrhea, sore throat, tinnitus, trouble swallowing and voice change.   Eyes: Positive for visual disturbance (corrective lenses). Negative for pain, redness and itching.       Old CVA  Respiratory: Negative for cough, choking, shortness of breath and wheezing.   Cardiovascular: Negative for chest pain, palpitations and leg swelling.  Gastrointestinal: Positive for diarrhea, nausea and vomiting. Negative for abdominal distention, abdominal pain and constipation.       Hx ulcerative colitis; on Lialda.  Endocrine: Negative for cold intolerance, heat intolerance, polydipsia, polyphagia and polyuria.       History of elevated glucose.    Genitourinary: Negative for difficulty urinating, dysuria, flank pain, frequency, hematuria, pelvic pain, urgency and vaginal discharge.  Musculoskeletal: Positive for back pain and gait problem. Negative for arthralgias, myalgias, neck pain and neck stiffness.       Hx PMR and chronic use of prednisone. History of osteopenia and kyphosis  Skin: Negative for color change, pallor and rash.       The right 2nd finger skin cut.   Allergic/Immunologic: Negative.   Neurological: Positive for dizziness and light-headedness. Negative for tremors, seizures, syncope, weakness, numbness and headaches.       History of poliomyelitis at age 73. History of CVA.09/21/15.  Hematological: Negative for adenopathy. Does not bruise/bleed easily.  Psychiatric/Behavioral: Negative for agitation, behavioral problems, confusion, dysphoric mood, hallucinations, sleep disturbance and suicidal ideas. The patient is not nervous/anxious and is not hyperactive.     Immunization History  Administered Date(s) Administered  .  Hep A / Hep B 06/08/2013, 06/15/2013, 06/29/2013, 06/12/2014  . Influenza Split 01/01/2014  . Influenza Whole 01/17/2009, 12/02/2010  . Influenza-Unspecified 12/22/2013, 12/21/2014, 01/04/2016  . Pneumococcal Conjugate-13 01/27/2015  . Pneumococcal Polysaccharide-23 07/25/2009  . Td 07/25/2009  . Tdap 12/22/2011   Pertinent  Health Maintenance Due  Topic Date Due  . MAMMOGRAM  07/18/2015  . INFLUENZA VACCINE  Completed  . DEXA SCAN  Completed  . PNA vac Low Risk Adult  Completed   Fall Risk  11/02/2015 05/01/2015 01/23/2015 07/20/2014 04/20/2013  Falls in the past year? No No No No No   Functional Status Survey:    Vitals:   01/29/16 1336  BP: (!) 177/76  Pulse: 79  Resp: 18  Temp: 97.9 F (36.6 C)  Weight: 120 lb (54.4 kg)  Height: 5\' 1"  (1.549 m)   Body mass index is 22.67 kg/m. Physical Exam  Constitutional: She is oriented to person, place, and time. She appears well-developed  and well-nourished. No distress.  HENT:  Right Ear: External ear normal.  Left Ear: External ear normal.  Nose: Nose normal.  Mouth/Throat: Oropharynx is clear and moist. No oropharyngeal exudate.  Wearing hearing aids bilaterally. EAC and TM normal bilaterally.  Eyes: Conjunctivae and EOM are normal. Pupils are equal, round, and reactive to light. No scleral icterus.  Prescription lenses  Neck: No JVD present. No tracheal deviation present. No thyromegaly present.  Cardiovascular: Normal rate, regular rhythm and intact distal pulses.  Exam reveals no gallop and no friction rub.   Murmur heard. Pulmonary/Chest: Effort normal. No respiratory distress. She has no wheezes. She has no rales. She exhibits no tenderness.  Abdominal: Soft. Bowel sounds are normal. She exhibits no distension and no mass. There is no tenderness.  Large diastasis recti  Musculoskeletal: Normal range of motion. She exhibits no edema or tenderness.  Kyphosis.. Miild gait instability.  Lymphadenopathy:    She has no cervical adenopathy.  Neurological: She is alert and oriented to person, place, and time. No cranial nerve deficit. Coordination normal.  Mild tremor of extended arms  Skin: No rash noted. She is not diaphoretic. No erythema. No pallor.  The right 2nd finger skin cut.   Psychiatric: She has a normal mood and affect. Her behavior is normal. Judgment and thought content normal.    Labs reviewed:  Recent Labs  11/28/15 0849  11/30/15 1941 12/01/15 0224 12/02/15 1113 12/06/15 12/12/15  NA 131*  < > 127* 132* 134* 134* 135*  K 3.3*  < > 3.3* 3.0* 4.1 4.3 3.9  CL 93*  < > 95* 98* 102  --   --   CO2 27  < > 23 27 27   --   --   GLUCOSE 171*  < > 229* 102* 114*  --   --   BUN 14  < > 14 12 12  30* 30*  CREATININE 0.93  < > 0.97 0.95 1.08* 1.2* 1.0  CALCIUM 8.4*  < > 8.1* 8.2* 8.6*  --   --   MG 2.6*  --  1.8 1.8  --   --   --   < > = values in this interval not displayed.  Recent Labs   11/25/15 0512 11/26/15 1013 11/27/15 0243 12/12/15  AST 31 21 18 13   ALT 20 20 16 14   ALKPHOS 75 64 57 68  BILITOT 1.0 0.8 0.9  --   PROT 7.5 7.3 6.1*  --   ALBUMIN 4.1 4.0 3.4*  --  Recent Labs  09/22/15 0832  09/28/15 1636  11/26/15 1013 11/27/15 0243 12/01/15 0224 12/02/15 1113 12/12/15  WBC 8.7  < > 7.6  < > 13.3* 11.5* 6.5 6.9 4.8  NEUTROABS 7.7  --  6.9  --  11.3*  --   --   --   --   HGB 11.8*  < > 12.7  < > 13.4 12.0 9.8* 10.8* 11.2*  HCT 37.4  < > 40.3  < > 39.1 36.4 30.9* 34.8* 35*  MCV 91.2  < > 93.3  < > 88.1 89.4 92.0 94.1  --   PLT 178  < > 212  < > 209 163 136* 150 205  < > = values in this interval not displayed. Lab Results  Component Value Date   TSH 2.008 09/25/2015   Lab Results  Component Value Date   HGBA1C 6.0 08/23/2015   Lab Results  Component Value Date   CHOL 180 08/23/2015   HDL 73 (A) 08/23/2015   LDLCALC 63 08/23/2015   TRIG 222 (A) 08/23/2015   CHOLHDL 3 11/14/2008    Significant Diagnostic Results in last 30 days:  No results found.  Assessment/Plan Essential hypertension 12/07/15 Na 134, K 4.3, Bun 30, creat 1.15 Continue Metoprolol 50mg , Diltiazem 60mg  bid, Lotensin 20/12.5mg    Cerebrovascular accident (CVA) ([place]) Hx of CVA 08/2015. Vision disturbance, HA, left sided weakness. Fall 12/30/15   Chronic combined systolic and diastolic CHF (congestive heart failure) ([place]) Compensated clinically.   Atrial tachycardia (HCC) Heart rate is in controlled, continue Metoprolol and Diltiazem.    GERD (gastroesophageal reflux disease) Stable, continue Ranitidine.      Polymyalgia rheumatica (HCC) Managed with Prednisone 5mg  daily   Ulcerative colitis ([place]) Left-sided colitis, in remission, on lialda, diagnosed 2006 Continue Lialda.    Urinary frequency No efficacy, dc [name] 25mg  daily. Observe the patient.       Family/ staff Communication: continue SNF for care assistance.   Labs/tests ordered:   none

## 2016-01-29 NOTE — Assessment & Plan Note (Signed)
12/07/15 Na 134, K 4.3, Bun 30, creat 1.15 Continue Metoprolol 50mg , Diltiazem 60mg  bid, Lotensin 20/12.5mg 

## 2016-01-29 NOTE — Assessment & Plan Note (Signed)
Compensated clinically.  

## 2016-01-30 DIAGNOSIS — E876 Hypokalemia: Secondary | ICD-10-CM | POA: Diagnosis not present

## 2016-01-30 DIAGNOSIS — A4150 Gram-negative sepsis, unspecified: Secondary | ICD-10-CM | POA: Diagnosis not present

## 2016-01-30 DIAGNOSIS — R2681 Unsteadiness on feet: Secondary | ICD-10-CM | POA: Diagnosis not present

## 2016-01-30 DIAGNOSIS — M6281 Muscle weakness (generalized): Secondary | ICD-10-CM | POA: Diagnosis not present

## 2016-01-30 DIAGNOSIS — I5042 Chronic combined systolic (congestive) and diastolic (congestive) heart failure: Secondary | ICD-10-CM | POA: Diagnosis not present

## 2016-01-30 DIAGNOSIS — A498 Other bacterial infections of unspecified site: Secondary | ICD-10-CM | POA: Diagnosis not present

## 2016-02-01 DIAGNOSIS — A498 Other bacterial infections of unspecified site: Secondary | ICD-10-CM | POA: Diagnosis not present

## 2016-02-01 DIAGNOSIS — M6281 Muscle weakness (generalized): Secondary | ICD-10-CM | POA: Diagnosis not present

## 2016-02-01 DIAGNOSIS — E876 Hypokalemia: Secondary | ICD-10-CM | POA: Diagnosis not present

## 2016-02-01 DIAGNOSIS — A4150 Gram-negative sepsis, unspecified: Secondary | ICD-10-CM | POA: Diagnosis not present

## 2016-02-01 DIAGNOSIS — I5042 Chronic combined systolic (congestive) and diastolic (congestive) heart failure: Secondary | ICD-10-CM | POA: Diagnosis not present

## 2016-02-01 DIAGNOSIS — R2681 Unsteadiness on feet: Secondary | ICD-10-CM | POA: Diagnosis not present

## 2016-02-04 ENCOUNTER — Ambulatory Visit: Payer: Medicare Other | Admitting: Neurology

## 2016-02-04 DIAGNOSIS — I5042 Chronic combined systolic (congestive) and diastolic (congestive) heart failure: Secondary | ICD-10-CM | POA: Diagnosis not present

## 2016-02-04 DIAGNOSIS — E876 Hypokalemia: Secondary | ICD-10-CM | POA: Diagnosis not present

## 2016-02-04 DIAGNOSIS — M6281 Muscle weakness (generalized): Secondary | ICD-10-CM | POA: Diagnosis not present

## 2016-02-04 DIAGNOSIS — R2681 Unsteadiness on feet: Secondary | ICD-10-CM | POA: Diagnosis not present

## 2016-02-04 DIAGNOSIS — A4150 Gram-negative sepsis, unspecified: Secondary | ICD-10-CM | POA: Diagnosis not present

## 2016-02-04 DIAGNOSIS — A498 Other bacterial infections of unspecified site: Secondary | ICD-10-CM | POA: Diagnosis not present

## 2016-02-05 ENCOUNTER — Ambulatory Visit (INDEPENDENT_AMBULATORY_CARE_PROVIDER_SITE_OTHER): Payer: Medicare Other | Admitting: Neurology

## 2016-02-05 ENCOUNTER — Encounter: Payer: Self-pay | Admitting: Neurology

## 2016-02-05 VITALS — BP 123/58 | HR 51 | Wt 119.4 lb

## 2016-02-05 DIAGNOSIS — M353 Polymyalgia rheumatica: Secondary | ICD-10-CM

## 2016-02-05 DIAGNOSIS — E785 Hyperlipidemia, unspecified: Secondary | ICD-10-CM

## 2016-02-05 DIAGNOSIS — I639 Cerebral infarction, unspecified: Secondary | ICD-10-CM | POA: Diagnosis not present

## 2016-02-05 DIAGNOSIS — I471 Supraventricular tachycardia: Secondary | ICD-10-CM | POA: Diagnosis not present

## 2016-02-05 DIAGNOSIS — I679 Cerebrovascular disease, unspecified: Secondary | ICD-10-CM

## 2016-02-05 DIAGNOSIS — I10 Essential (primary) hypertension: Secondary | ICD-10-CM

## 2016-02-05 DIAGNOSIS — R350 Frequency of micturition: Secondary | ICD-10-CM | POA: Diagnosis not present

## 2016-02-05 DIAGNOSIS — N390 Urinary tract infection, site not specified: Secondary | ICD-10-CM | POA: Diagnosis not present

## 2016-02-05 DIAGNOSIS — I63321 Cerebral infarction due to thrombosis of right anterior cerebral artery: Secondary | ICD-10-CM

## 2016-02-05 DIAGNOSIS — N3946 Mixed incontinence: Secondary | ICD-10-CM | POA: Diagnosis not present

## 2016-02-05 NOTE — Patient Instructions (Addendum)
-   continue aggrenox and lipitor for stroke prevention.  - follow up with urology to treat UTI - Follow up with your primary care physician for stroke risk factor modification. Recommend maintain blood pressure goal 130/80, diabetes with hemoglobin A1c goal below 7.0% and lipids with LDL cholesterol goal below 70 mg/dL.  - check BP at facility and record, keep hydrated and avoid low BP - continue work with PT/OT in facility and self exercise   - follow up in 4 months.

## 2016-02-05 NOTE — Progress Notes (Signed)
STROKE NEUROLOGY FOLLOW UP NOTE  NAME: Tammy Beard DOB: 03-Feb-1941  REASON FOR VISIT: stroke follow up HISTORY FROM: chart and brother  Today we had the pleasure of seeing Tammy Beard in follow-up at our Neurology Clinic. Pt was accompanied by brother.   History Summary Ms. Tammy Beard is a 75 y.o. female with history of hypertension, hyperlipidemia, and previous stroke ( by MRI ) admitted on 09/21/15 for dizziness and unstable gait. found to have UTI and dehydration, received Abx. MRI showed tiny acute right ACA infarct. CTA head and neck showed severe intracranial stenosis involving b/l terminus ICA, b/l MCA M1 and bifurcation and attenuated right MCA branches. DVT, TTE negative. LDL 63 and A1C 6.0. She was put on DAPT for 3 months and recommend 30 day cardiac event monitoring on discharge.   11/02/15 follow up - the patient has been doing fair.  She went to ED on 09/28/15 for intermittent lightheadedness when walking and intermittent tremors for 2 days. Found again to have dehydration and UTI. Given IV bolus and Abx. ED note stated that " Patient had a recent diagnosis of atrial fibrillation" and "Patient also seemed to be in atrial fibrillation on cardiac monitor intermittently", for which I am not aware of. Her tele strip did show MAT, but no afib. She has cardiology appointment on 11/06/15. BP today 104/70. Brother stated that she did not drink water very well in NH. She walks with PT/OT but still more in the wheelchair not able to walk yet.   Interval History During the interval time, pt continued to have frequent UTI and was admitted on 11/26/15 for UTI, pyelonephritis hyponatremia and confusion. She was treated with antibiotics.  CT head no acute abnormalities. continued to have atrial tachycardia and has been seen by cardiology. Metoprolol increased and restarted on cardizem. Patient was asymptomatic.   On 12/20/15, she was seen by Dr. Brett Fairy for physical declined after  discharge, progressively being cognitively less alert and oriented and physically overall more impaired. Complained of left side increasing weak and slowing reaction, had to use wheelchair. Found to have low BP at 86/52. Had again CT head showed no acute change. And her ASA and plavix were changed to aggrenox.  Since the visit, pt is much improved. Today came in fully orientated and at her mental baseline. Brother stated that her mental changes in 11/2015 was considered due to frequent UTI for which she has been referred to a urologist for further evaluation. BP today is much improved too 123/58.  REVIEW OF SYSTEMS: Full 14 system review of systems performed and notable only for those listed below and in HPI above, all others are negative:  Constitutional:   Cardiovascular: murmur Ear/Nose/Throat:  Hearing loss, runny nose, trouble swallowing Skin:  Eyes:  Loss of vision, eye pain, blurry vision Respiratory:   Gastroitestinal:  Swollen abdomen Genitourinary: frequent urination, urgency Hematology/Lymphatic:   Endocrine:  Musculoskeletal:  walking difficulty Allergy/Immunology:  Frequent infection, env allergies Neurological:  Weakness, HA Psychiatric: decreased concentration Sleep: daytime sleepiness  The following represents the patient's updated allergies and side effects list: No Known Allergies  The neurologically relevant items on the patient's problem list were reviewed on today's visit.  Neurologic Examination  A problem focused neurological exam (12 or more points of the single system neurologic examination, vital signs counts as 1 point, cranial nerves count for 8 points) was performed.  Blood pressure (!) 123/58, pulse (!) 51, weight 119 lb 6.4 oz (54.2 kg).  General - Well nourished, well developed, in no apparent distress.  Ophthalmologic - Sharp disc margins OU.   Cardiovascular - Regular rate and rhythm,  No afib rhythm.   Mental Status -  Level of arousal and  orientation to time, place, and person were intact. Language including expression, naming, repetition, comprehension was assessed and found intact. Fund of Knowledge was assessed and was intact.  Cranial Nerves II - XII - II - Visual field intact OU. III, IV, VI - Extraocular movements intact V - Facial sensation intact bilaterally. VII - Facial movement intact bilaterally. VIII - Hearing & vestibular intact bilaterally. X - Palate elevates symmetrically. XI - Chin turning & shoulder shrug intact bilaterally. XII - Tongue protrusion intact.  Motor Strength - The patient's strength was 4+/5 in all extremities and pronator drift was absent.  Bulk was normal and fasciculations were absent.   Motor Tone - Muscle tone was assessed at the neck and appendages and was normal.  Reflexes - The patient's reflexes were 1+ in all extremities and she had no pathological reflexes.  Sensory - Light touch, temperature/pinprick were assessed and were normal.    Coordination - The patient had normal movements in the hands with no ataxia or dysmetria.  Tremor was absent.  Gait and Station - walks with walker, slow with stooped posturing, but steady.   Data reviewed: I personally reviewed the images and agree with the radiology interpretations.  Dg Chest 2 View 09/21/2015   COPD.  No evidence of pneumothorax or other acute findings. Aortic atherosclerosis noted.   Ct Head Wo Contrast 09/21/2015   No acute intracranial abnormalities. Generalized atrophy. Mega cisterna magna.   Mr Brain Wo Contrast 09/21/2015   Patient was not able to complete the examination. Sequences obtained are motion degraded. Question tiny acute infarct medial aspect posterior right frontal -parietal lobe junction. Remote infarct superior vermis. No intracranial hemorrhage. Mild chronic microvascular changes. Moderate global atrophy without hydrocephalus.   Mr Jodene Nam Head/brain Wo Cm 09/22/2015   Severely limited study due to  motion artifact. No large or proximal arterial branch occlusion. No definite high-grade or correctable stenosis, although evaluation is fairly limited.   Ct Angio Head & Neck W Or Wo Contrast 09/24/2015   1. Negative for emergent large vessel occlusion but positive for SEVERE intracranial arterial stenoses: - Left ICA terminus (CRITICAL stenosis - see series 604 image 88), - Right MCA distal M1 and bifurcation (SEVERE stenoses) with attenuated Right MCA branches. 2. Atherosclerosis with mild to moderate stenosis of the right ICA siphon and left MCA M1 segment. 3. No extracranial arterial stenosis. Tortuous cervical carotid and vertebral arteries. 4. Stable CT appearance of the brain. 5. Small layering left pleural effusion. Apical pulmonary scarring greater on the right.   2D Echocardiogram  - Left ventricle: The cavity size was normal. Wall thickness was increased in a pattern of mild LVH. Systolic function was normal. The estimated ejection fraction was in the range of 50% to 55%. Features are consistent with a pseudonormal left ventricular filling pattern, with concomitant abnormal relaxation and increased filling pressure (grade 2 diastolic dysfunction). - Aortic valve: Mildly calcified annulus. Mildly thickened, mildly calcified leaflets. There was trivial regurgitation. - Mitral valve: Mildly calcified annulus. Mildly thickened leaflets  LE venous doppler - Negative for deep and superficial vein thrombosis in both leg  30 day cardiac event monitoring Zero atrial fibrillation Average HR 72 No pauses of 3 seconds or longer Multiple APCs Atrial bigeminy  Short atrial runs  Ct Head Wo Contrast 12/24/2015 1.    Mild atrophy and mild chronic microvascular ischemic changes related to aging and unchanged when compared to the night 07/12/2015 CT scan. 2.    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    Also  seen on prior CT scan and MRI from 09/21/2015. 3.    There are no acute findings.  Component     Latest Ref Rng & Units 08/23/2015 09/24/2015 09/25/2015  Triglycerides     40 - 160 mg/dL 222 (A)    Cholesterol     0 - 200 mg/dL 180    HDL Cholesterol     35 - 70 mg/dL 73 (A)    LDL (calc)     mg/dL 63    Hemoglobin A1C      6.0    Sed Rate     0 - 22 mm/hr  45 (H)   CRP     <1.0 mg/dL  1.1 (H)   TSH     0.350 - 4.500 uIU/mL   2.008    Assessment: As you may recall, she is a 75 y.o. Caucasian female with PMH of hypertension, hyperlipidemia admitted on 09/21/15 for dizziness and unstable gait. found to have UTI and dehydration, received Abx. MRI showed tiny acute right ACA infarct. CTA head and neck showed severe intracranial stenosis involving b/l terminus ICA, b/l MCA M1 and bifurcation and attenuated right MCA branches. DVT, TTE negative. LDL 63 and A1C 6.0. Stroke considered due to dehydration and infection in the setting of intracranial stenosis. She was put on DAPT for 3 months and recommend 30 day cardiac event monitoring on discharge. She had several ED visit and admissions from 09/2015 to 11/2015 for  on 09/28/15 for dehydration and UTI, and was treated with IV bolus and Abx. Repeat several CT head showed no acute changes. ED note stated that " Patient had a recent diagnosis of atrial fibrillation" and "Patient also seemed to be in atrial fibrillation on cardiac monitor intermittently". Had cardiology follow up and 30 day monitoring did not show afib but atrial tachycardia. She is on metoprolol and cardizem. So far she is doing much better, at mental baseline. She walks with walker now. She will see urologist soon.   Plan:  - continue aggrenox and lipitor for stroke prevention.  - follow up with urology to treat UTI - Follow up with your primary care physician for stroke risk factor modification. Recommend maintain blood pressure goal 130/80, diabetes with hemoglobin A1c goal below 7.0% and  lipids with LDL cholesterol goal below 70 mg/dL.  - check BP at facility and record, keep hydrated and avoid low BP - continue work with PT/OT in facility and self exercise   - follow up in 4 months.   I spent more than 25 minutes of face to face time with the patient. Greater than 50% of time was spent in counseling and coordination of care. We discussed keep hydration and follow up with urology for UTI as well as PT/OT and self exercise.   No orders of the defined types were placed in this encounter.   No orders of the defined types were placed in this encounter.   Patient Instructions  - continue aggrenox and lipitor for stroke prevention.  - follow up with urology to treat UTI - Follow up with your primary care physician for stroke risk factor modification. Recommend maintain blood pressure goal 130/80,  diabetes with hemoglobin A1c goal below 7.0% and lipids with LDL cholesterol goal below 70 mg/dL.  - check BP at facility and record, keep hydrated and avoid low BP - continue work with PT/OT in facility and self exercise   - follow up in 4 months.    Rosalin Hawking, MD PhD [facility] [address], [place], [place] [address] [phone]

## 2016-02-06 DIAGNOSIS — E876 Hypokalemia: Secondary | ICD-10-CM | POA: Diagnosis not present

## 2016-02-06 DIAGNOSIS — M6281 Muscle weakness (generalized): Secondary | ICD-10-CM | POA: Diagnosis not present

## 2016-02-06 DIAGNOSIS — R2681 Unsteadiness on feet: Secondary | ICD-10-CM | POA: Diagnosis not present

## 2016-02-06 DIAGNOSIS — I5042 Chronic combined systolic (congestive) and diastolic (congestive) heart failure: Secondary | ICD-10-CM | POA: Diagnosis not present

## 2016-02-06 DIAGNOSIS — A498 Other bacterial infections of unspecified site: Secondary | ICD-10-CM | POA: Diagnosis not present

## 2016-02-06 DIAGNOSIS — A4150 Gram-negative sepsis, unspecified: Secondary | ICD-10-CM | POA: Diagnosis not present

## 2016-02-07 DIAGNOSIS — I679 Cerebrovascular disease, unspecified: Secondary | ICD-10-CM | POA: Insufficient documentation

## 2016-02-13 DIAGNOSIS — R2681 Unsteadiness on feet: Secondary | ICD-10-CM | POA: Diagnosis not present

## 2016-02-13 DIAGNOSIS — A4150 Gram-negative sepsis, unspecified: Secondary | ICD-10-CM | POA: Diagnosis not present

## 2016-02-13 DIAGNOSIS — E876 Hypokalemia: Secondary | ICD-10-CM | POA: Diagnosis not present

## 2016-02-13 DIAGNOSIS — I5042 Chronic combined systolic (congestive) and diastolic (congestive) heart failure: Secondary | ICD-10-CM | POA: Diagnosis not present

## 2016-02-13 DIAGNOSIS — A498 Other bacterial infections of unspecified site: Secondary | ICD-10-CM | POA: Diagnosis not present

## 2016-02-13 DIAGNOSIS — M6281 Muscle weakness (generalized): Secondary | ICD-10-CM | POA: Diagnosis not present

## 2016-02-19 DIAGNOSIS — N390 Urinary tract infection, site not specified: Secondary | ICD-10-CM | POA: Diagnosis not present

## 2016-02-21 DIAGNOSIS — M6281 Muscle weakness (generalized): Secondary | ICD-10-CM | POA: Diagnosis not present

## 2016-02-21 DIAGNOSIS — E876 Hypokalemia: Secondary | ICD-10-CM | POA: Diagnosis not present

## 2016-02-21 DIAGNOSIS — R2681 Unsteadiness on feet: Secondary | ICD-10-CM | POA: Diagnosis not present

## 2016-02-21 DIAGNOSIS — A4150 Gram-negative sepsis, unspecified: Secondary | ICD-10-CM | POA: Diagnosis not present

## 2016-02-21 DIAGNOSIS — I5042 Chronic combined systolic (congestive) and diastolic (congestive) heart failure: Secondary | ICD-10-CM | POA: Diagnosis not present

## 2016-02-21 DIAGNOSIS — A498 Other bacterial infections of unspecified site: Secondary | ICD-10-CM | POA: Diagnosis not present

## 2016-02-22 ENCOUNTER — Encounter: Payer: Self-pay | Admitting: Nurse Practitioner

## 2016-02-22 DIAGNOSIS — S5291XA Unspecified fracture of right forearm, initial encounter for closed fracture: Secondary | ICD-10-CM

## 2016-02-22 DIAGNOSIS — N390 Urinary tract infection, site not specified: Secondary | ICD-10-CM | POA: Insufficient documentation

## 2016-02-22 HISTORY — DX: Unspecified fracture of right forearm, initial encounter for closed fracture: S52.91XA

## 2016-02-26 ENCOUNTER — Encounter: Payer: Self-pay | Admitting: *Deleted

## 2016-02-26 ENCOUNTER — Non-Acute Institutional Stay (SKILLED_NURSING_FACILITY): Payer: Medicare Other | Admitting: Nurse Practitioner

## 2016-02-26 DIAGNOSIS — I471 Supraventricular tachycardia: Secondary | ICD-10-CM

## 2016-02-26 DIAGNOSIS — K219 Gastro-esophageal reflux disease without esophagitis: Secondary | ICD-10-CM | POA: Diagnosis not present

## 2016-02-26 DIAGNOSIS — I5042 Chronic combined systolic (congestive) and diastolic (congestive) heart failure: Secondary | ICD-10-CM | POA: Diagnosis not present

## 2016-02-26 DIAGNOSIS — I10 Essential (primary) hypertension: Secondary | ICD-10-CM

## 2016-02-26 DIAGNOSIS — N390 Urinary tract infection, site not specified: Secondary | ICD-10-CM

## 2016-02-26 DIAGNOSIS — I6300 Cerebral infarction due to thrombosis of unspecified precerebral artery: Secondary | ICD-10-CM | POA: Diagnosis not present

## 2016-02-26 DIAGNOSIS — M353 Polymyalgia rheumatica: Secondary | ICD-10-CM | POA: Diagnosis not present

## 2016-02-26 NOTE — Assessment & Plan Note (Signed)
Stable, continue Ranitidine.  

## 2016-02-26 NOTE — Progress Notes (Signed)
Location:  [facility] Room Number: 80 Place of Service:  SNF (31) Provider:  Laurissa Cowper, [place]  NP  Jeanmarie Hubert, MD  Patient Care Team: [name], MD as PCP - General (Internal Medicine) [name], MD as Consulting Physician (Cardiology) [name], MD as Consulting Physician (Ophthalmology) [name], MD as Consulting Physician (Gastroenterology) [name], MD as Consulting Physician (Dermatology)  Extended Emergency Contact Information Primary Emergency Contact: [name]) Address: [place], [place] [address] [name] of [place] Phone: [phone] Mobile Phone: [phone] Relation: Brother Secondary Emergency Contact: [name] [place] of [place] Phone: [phone] Relation: Sister  Code Status:  DNR Goals of care: Advanced Directive information Advanced Directives 02/26/2016  Does Patient Have a Medical Advance Directive? Yes  Type of Paramedic of [place];Living will;Out of facility DNR (pink MOST or yellow form)  Does patient want to make changes to medical advance directive? No - Patient declined  Copy of [place] in Chart? Yes  Pre-existing out of facility DNR order (yellow form or pink MOST form) -     Chief Complaint  Patient presents with  . Acute Visit    UTI    HPI:  Tammy Beard is a 75 y.o. female seen today for an acute visit for 02/21/16 urine culture P. Aeruginosa Gentamicin x 7 days.      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 On Aggrenox. Marland Kitchen   Hx pf HTN, controlled on Metoprolol 50mg , Diltiazem 60mg  bid, Lotensin 20/12.5mg  daily. Urinary frequency, no efficacy since [name] 25mg  daily. Stable ulcerative colitis on Lialda.  Past Medical History:  Diagnosis Date  . Colitis   . Ecchymosis 11/15/2015  . GERD (gastroesophageal reflux disease)   . Heart murmur   . Hyperlipidemia   . Hypertension   .  Osteopenia    s/p 7 yrs Fosamax  . Polio   . Polymyalgia ([place])   . Rheumatic fever   . Sleep disorder   . Stroke ([place])   . Ulcerative colitis    Past Surgical History:  Procedure Laterality Date  . BREAST LUMPECTOMY  1986   left; benign  . CATARACT EXTRACTION     Left eye   . COLONOSCOPY  2006  . Colonosopy  07/05/13   Dr. Deatra Ina  . DILATION AND CURETTAGE OF UTERUS  12/1999  . MELANOMA EXCISION  2007   left arm  . NECK SURGERY  1986   Benign growth on neck removed  . TONSILLECTOMY      No Known Allergies    Medication List       Accurate as of 02/26/16  2:15 PM. Always use your most recent med list.          acetaminophen 325 MG tablet Commonly known as:  TYLENOL Take 650 mg by mouth every 6 (six) hours as needed.   ARTIFICIAL TEAR SOLUTION OP Apply 1 drop to eye daily as needed.   atorvastatin 10 MG tablet Commonly known as:  LIPITOR Take 1 tablet (10 mg total) by mouth daily at 6 PM.   benazepril-hydrochlorthiazide 20-12.5 MG tablet Commonly known as:  LOTENSIN HCT Take 0.5 tablets by mouth daily.   CALCIUM 600+D PLUS MINERALS 600-400 MG-UNIT Tabs Take 2 tablets by mouth 2 (two) times daily.   diltiazem 60 MG tablet Commonly known as:  CARDIZEM Take 1 tablet (60 mg total) by mouth every 12 (twelve)  hours.   dimenhyDRINATE 50 MG tablet Commonly known as:  DRAMAMINE Take 1 tablet every 6 hours as needed for nausea or dizziness   dipyridamole-aspirin 200-25 MG 12hr capsule Commonly known as:  AGGRENOX Take 1 capsule by mouth 2 (two) times daily.   fluticasone 50 MCG/ACT nasal spray Commonly known as:  FLONASE Place 1 spray into both nostrils daily as needed for allergies or rhinitis. Reported on 09/28/2015   lactose free nutrition Liqd Take 237 mLs by mouth. Drink one can daily   loratadine 10 MG tablet Commonly known as:  CLARITIN Take 10 mg by mouth. Take one tablet daily   mesalamine 1.2 g EC tablet Commonly known as:  LIALDA take 2  tablets by mouth daily with BREAKFAST   metoprolol 50 MG tablet Commonly known as:  LOPRESSOR Take 1 tablet (50 mg total) by mouth 2 (two) times daily.   multivitamin tablet Take 1 tablet by mouth daily.   MYRBETRIQ 25 MG Tb24 tablet Generic drug:  mirabegron ER Take 25 mg by mouth daily.   predniSONE 5 MG tablet Commonly known as:  DELTASONE TWO TABLETs DAILY TO TREAT POLYMYALGIA RHEUMATICA   ranitidine 150 MG tablet Commonly known as:  ZANTAC Take 150 mg by mouth at bedtime. Take one tablet at bedtime   sennosides-docusate sodium 8.6-50 MG tablet Commonly known as:  SENOKOT-S Take 1 tablet by mouth 3 (three) times daily. Take one tablet three times daily for constipation-hold for loose stools   UTI-STAT Liqd Take 30 mLs by mouth 2 (two) times daily.       Review of Systems  Constitutional: Negative for activity change, appetite change, chills, diaphoresis, fatigue, fever and unexpected weight change.  HENT: Negative for congestion, ear discharge, ear pain, hearing loss, postnasal drip, rhinorrhea, sore throat, tinnitus, trouble swallowing and voice change.   Eyes: Positive for visual disturbance (corrective lenses). Negative for pain, redness and itching.       Old CVA  Respiratory: Negative for cough, choking, shortness of breath and wheezing.   Cardiovascular: Negative for chest pain, palpitations and leg swelling.  Gastrointestinal: Positive for diarrhea, nausea and vomiting. Negative for abdominal distention, abdominal pain and constipation.       Hx ulcerative colitis; on Lialda.  Endocrine: Negative for cold intolerance, heat intolerance, polydipsia, polyphagia and polyuria.       History of elevated glucose.  Genitourinary: Negative for difficulty urinating, dysuria, flank pain, frequency, hematuria, pelvic pain, urgency and vaginal discharge.  Musculoskeletal: Positive for back pain and gait problem. Negative for arthralgias, myalgias, neck pain and neck  stiffness.       Hx PMR and chronic use of prednisone. History of osteopenia and kyphosis  Skin: Negative for color change, pallor and rash.       The right 2nd finger skin cut.   Allergic/Immunologic: Negative.   Neurological: Positive for dizziness and light-headedness. Negative for tremors, seizures, syncope, weakness, numbness and headaches.       History of poliomyelitis at age 59. History of CVA.09/21/15.  Hematological: Negative for adenopathy. Does not bruise/bleed easily.  Psychiatric/Behavioral: Negative for agitation, behavioral problems, confusion, dysphoric mood, hallucinations, sleep disturbance and suicidal ideas. The patient is not nervous/anxious and is not hyperactive.     Immunization History  Administered Date(s) Administered  . Hep A / Hep B 06/08/2013, 06/15/2013, 06/29/2013, 06/12/2014  . Influenza Split 01/01/2014  . Influenza Whole 01/17/2009, 12/02/2010  . Influenza-Unspecified 12/22/2013, 12/21/2014, 01/04/2016  . Pneumococcal Conjugate-13 01/27/2015  . Pneumococcal Polysaccharide-23 07/25/2009  .  Td 07/25/2009  . Tdap 12/22/2011   Pertinent  Health Maintenance Due  Topic Date Due  . MAMMOGRAM  07/18/2015  . INFLUENZA VACCINE  Completed  . DEXA SCAN  Completed  . PNA vac Low Risk Adult  Completed   Fall Risk  02/05/2016 11/02/2015 05/01/2015 01/23/2015 07/20/2014  Falls in the past year? Yes No No No No  Number falls in past yr: 1 - - - -  Injury with Fall? No - - - -   Functional Status Survey:    Vitals:   02/26/16 1331  BP: 136/65  Pulse: (!) 59  Resp: 16  Temp: 98.3 F (36.8 C)  SpO2: 98%  Weight: 121 lb (54.9 kg)  Height: 5\' 1"  (1.549 m)   Body mass index is 22.86 kg/m. Physical Exam  Constitutional: She is oriented to person, place, and time. She appears well-developed and well-nourished. No distress.  HENT:  Right Ear: External ear normal.  Left Ear: External ear normal.  Nose: Nose normal.  Mouth/Throat: Oropharynx is clear and  moist. No oropharyngeal exudate.  Wearing hearing aids bilaterally. EAC and TM normal bilaterally.  Eyes: Conjunctivae and EOM are normal. Pupils are equal, round, and reactive to light. No scleral icterus.  Prescription lenses  Neck: No JVD present. No tracheal deviation present. No thyromegaly present.  Cardiovascular: Normal rate, regular rhythm and intact distal pulses.  Exam reveals no gallop and no friction rub.   Murmur heard. Pulmonary/Chest: Effort normal. No respiratory distress. She has no wheezes. She has no rales. She exhibits no tenderness.  Abdominal: Soft. Bowel sounds are normal. She exhibits no distension and no mass. There is no tenderness.  Large diastasis recti  Musculoskeletal: Normal range of motion. She exhibits no edema or tenderness.  Kyphosis.. Miild gait instability.  Lymphadenopathy:    She has no cervical adenopathy.  Neurological: She is alert and oriented to person, place, and time. No cranial nerve deficit. Coordination normal.  Mild tremor of extended arms  Skin: No rash noted. She is not diaphoretic. No erythema. No pallor.  The right 2nd finger skin cut.   Psychiatric: She has a normal mood and affect. Her behavior is normal. Judgment and thought content normal.    Labs reviewed:  Recent Labs  11/28/15 0849  11/30/15 1941 12/01/15 0224 12/02/15 1113 12/06/15 12/12/15  NA 131*  < > 127* 132* 134* 134* 135*  K 3.3*  < > 3.3* 3.0* 4.1 4.3 3.9  CL 93*  < > 95* 98* 102  --   --   CO2 27  < > 23 27 27   --   --   GLUCOSE 171*  < > 229* 102* 114*  --   --   BUN 14  < > 14 12 12  30* 30*  CREATININE 0.93  < > 0.97 0.95 1.08* 1.2* 1.0  CALCIUM 8.4*  < > 8.1* 8.2* 8.6*  --   --   MG 2.6*  --  1.8 1.8  --   --   --   < > = values in this interval not displayed.  Recent Labs  11/25/15 0512 11/26/15 1013 11/27/15 0243 12/12/15  AST 31 21 18 13   ALT 20 20 16 14   ALKPHOS 75 64 57 68  BILITOT 1.0 0.8 0.9  --   PROT 7.5 7.3 6.1*  --   ALBUMIN 4.1 4.0  3.4*  --     Recent Labs  09/22/15 0832  09/28/15 1636  11/26/15 1013 11/27/15  [id number] 12/01/15 0224 12/02/15 1113 12/12/15  WBC 8.7  < > 7.6  < > 13.3* 11.5* 6.5 6.9 4.8  NEUTROABS 7.7  --  6.9  --  11.3*  --   --   --   --   HGB 11.8*  < > 12.7  < > 13.4 12.0 9.8* 10.8* 11.2*  HCT 37.4  < > 40.3  < > 39.1 36.4 30.9* 34.8* 35*  MCV 91.2  < > 93.3  < > 88.1 89.4 92.0 94.1  --   PLT 178  < > 212  < > 209 163 136* 150 205  < > = values in this interval not displayed. Lab Results  Component Value Date   TSH 2.008 09/25/2015   Lab Results  Component Value Date   HGBA1C 6.0 08/23/2015   Lab Results  Component Value Date   CHOL 180 08/23/2015   HDL 73 (A) 08/23/2015   LDLCALC 63 08/23/2015   TRIG 222 (A) 08/23/2015   CHOLHDL 3 11/14/2008    Significant Diagnostic Results in last 30 days:  No results found.  Assessment/Plan Essential hypertension 12/07/15 Na 134, K 4.3, Bun 30, creat 1.15 Continue Metoprolol 50mg , Diltiazem 60mg  bid, Lotensin 20/12.5mg    Cerebrovascular accident (CVA) ([place]) Hx of CVA 08/2015. Vision disturbance, HA, left sided weakness. Fall 12/30/15  Chronic combined systolic and diastolic CHF (congestive heart failure) ([place]) Compensated clinically.   Atrial tachycardia ([facility]) Heart rate is in controlled, continue Metoprolol and Diltiazem.     GERD (gastroesophageal reflux disease) Stable, continue Ranitidine.     UTI (urinary tract infection) 02/21/16 urine culture P. Aeruginosa Gentamicin x 7 days.    Polymyalgia rheumatica ([place]) Managed with Prednisone 5mg  daily      Family/ staff Communication: SNF  Labs/tests ordered:  none

## 2016-02-26 NOTE — Assessment & Plan Note (Signed)
Managed with Prednisone 5mg daily 

## 2016-02-26 NOTE — Assessment & Plan Note (Signed)
Compensated clinically.  

## 2016-02-26 NOTE — Assessment & Plan Note (Signed)
Hx of CVA 08/2015. Vision disturbance, HA, left sided weakness. Fall 12/30/15

## 2016-02-26 NOTE — Assessment & Plan Note (Signed)
12/07/15 Na 134, K 4.3, Bun 30, creat 1.15 Continue Metoprolol 50mg , Diltiazem 60mg  bid, Lotensin 20/12.5mg 

## 2016-02-26 NOTE — Assessment & Plan Note (Signed)
02/21/16 urine culture P. Aeruginosa Gentamicin x 7 days.

## 2016-02-26 NOTE — Assessment & Plan Note (Signed)
Heart rate is in controlled, continue Metoprolol and Diltiazem.

## 2016-02-29 ENCOUNTER — Encounter: Payer: Self-pay | Admitting: Internal Medicine

## 2016-02-29 ENCOUNTER — Non-Acute Institutional Stay (SKILLED_NURSING_FACILITY): Payer: Medicare Other | Admitting: Internal Medicine

## 2016-02-29 DIAGNOSIS — I10 Essential (primary) hypertension: Secondary | ICD-10-CM | POA: Diagnosis not present

## 2016-02-29 DIAGNOSIS — I5042 Chronic combined systolic (congestive) and diastolic (congestive) heart failure: Secondary | ICD-10-CM

## 2016-02-29 DIAGNOSIS — S52501D Unspecified fracture of the lower end of right radius, subsequent encounter for closed fracture with routine healing: Secondary | ICD-10-CM | POA: Diagnosis not present

## 2016-02-29 DIAGNOSIS — R269 Unspecified abnormalities of gait and mobility: Secondary | ICD-10-CM

## 2016-02-29 DIAGNOSIS — R42 Dizziness and giddiness: Secondary | ICD-10-CM | POA: Diagnosis not present

## 2016-02-29 DIAGNOSIS — N390 Urinary tract infection, site not specified: Secondary | ICD-10-CM

## 2016-02-29 DIAGNOSIS — D649 Anemia, unspecified: Secondary | ICD-10-CM

## 2016-02-29 DIAGNOSIS — I6300 Cerebral infarction due to thrombosis of unspecified precerebral artery: Secondary | ICD-10-CM

## 2016-02-29 NOTE — Progress Notes (Signed)
Progress Note    Location:  [facility] Room Number: [id number] Place of Service:  SNF [phone]) Provider:  Jeanmarie Hubert, MD  Patient Care Team: [name], MD as PCP - General (Internal Medicine) [name], MD as Consulting Physician (Cardiology) [name], MD as Consulting Physician (Ophthalmology) [name], MD as Consulting Physician (Gastroenterology) [name], MD (Inactive) as Consulting Physician (Dermatology)  Extended Emergency Contact Information Primary Emergency Contact: [name]) Address: [place], [place] [address] [name] of [place] Phone: [phone] Mobile Phone: [phone] Relation: Brother Secondary Emergency Contact: [name] [place] of [place] Phone: [phone] Relation: Sister  Code Status:  DNR Goals of care: Advanced Directive information Advanced Directives 02/29/2016  Does Patient Have a Medical Advance Directive? Yes  Type of Paramedic of [place];Living will;Out of facility DNR (pink MOST or yellow form)  Does patient want to make changes to medical advance directive? -  Copy of [place] in Chart? Yes  Pre-existing out of facility DNR order (yellow form or pink MOST form) Yellow form placed in chart (order not valid for inpatient use);Pink MOST form placed in chart (order not valid for inpatient use)     Chief Complaint  Patient presents with  . Medical Management of Chronic Issues    routine 90 day visit    HPI:  Pt is a 75 y.o. female seen today for medical management of chronic diseases.  Patient was seen briefly on 02/29/16. I returned on 03/20/16 for a more extended visit.  Recent urine culture showed pseudomonas aeruginosa. I gave order for Fortaz x 500 mg Im bid for 7 days. She has completed this therapy. Denies dysuria. No fever. Continues with urinary frequency and nocturia.  She  states she is not being walked as regularly as she would like.   She has seen Dr. Erlinda Hong and because of recent acute distal right radial fracture  sustained in a fall on 12/18/ 17, and gait instability even with the walker, her progress with improved strength and safe mobility has been hindered. She is in a right forearm cast. Pain control is OK.   She continues to recover from her CVA. She denies dizziness at this time, but she is not coordinated in her movements when walking.   Past Medical History:  Diagnosis Date  . Colitis   . Ecchymosis 11/15/2015  . GERD (gastroesophageal reflux disease)   . Heart murmur   . Hyperlipidemia   . Hypertension   . Osteopenia    s/p 7 yrs Fosamax  . Polio   . Polymyalgia ([place])   . Rheumatic fever   . Sleep disorder   . Stroke ([place])   . Ulcerative colitis    Past Surgical History:  Procedure Laterality Date  . BREAST LUMPECTOMY  1986   left; benign  . CATARACT EXTRACTION     Left eye   . COLONOSCOPY  2006  . Colonosopy  07/05/13   Dr. Deatra Ina  . DILATION AND CURETTAGE OF UTERUS  12/1999  . MELANOMA EXCISION  2007   left arm  . NECK SURGERY  1986   Benign growth on neck removed  . TONSILLECTOMY      No Known Allergies    Medication List       Accurate as of 02/29/16 11:57 AM. Always use your most recent med list.  acetaminophen 325 MG tablet Commonly known as:  TYLENOL Take 650 mg by mouth every 6 (six) hours as needed.   ARTIFICIAL TEAR SOLUTION OP Apply 1 drop to eye daily as needed.   atorvastatin 10 MG tablet Commonly known as:  LIPITOR Take 1 tablet (10 mg total) by mouth daily at 6 PM.   benazepril-hydrochlorthiazide 20-12.5 MG tablet Commonly known as:  LOTENSIN HCT Take 0.5 tablets by mouth daily.   CALCIUM 600+D PLUS MINERALS 600-400 MG-UNIT Tabs Take 2 tablets by mouth 2 (two) times daily.   diltiazem 60 MG tablet Commonly known as:  CARDIZEM Take 1 tablet (60 mg total) by mouth every 12 (twelve)  hours.   dimenhyDRINATE 50 MG tablet Commonly known as:  DRAMAMINE Take 1 tablet every 6 hours as needed for nausea or dizziness   dipyridamole-aspirin 200-25 MG 12hr capsule Commonly known as:  AGGRENOX Take 1 capsule by mouth 2 (two) times daily.   fluticasone 50 MCG/ACT nasal spray Commonly known as:  FLONASE Place 1 spray into both nostrils daily as needed for allergies or rhinitis. Reported on 09/28/2015   lactose free nutrition Liqd Take 237 mLs by mouth. Drink one can daily   loratadine 10 MG tablet Commonly known as:  CLARITIN Take 10 mg by mouth. Take one tablet daily   mesalamine 1.2 g EC tablet Commonly known as:  LIALDA take 2 tablets by mouth daily with BREAKFAST   metoprolol 50 MG tablet Commonly known as:  LOPRESSOR Take 1 tablet (50 mg total) by mouth 2 (two) times daily.   multivitamin tablet Take 1 tablet by mouth daily.   MYRBETRIQ 25 MG Tb24 tablet Generic drug:  mirabegron ER Take 25 mg by mouth daily.   predniSONE 5 MG tablet Commonly known as:  DELTASONE TWO TABLETs DAILY TO TREAT POLYMYALGIA RHEUMATICA   RA ZINC OXIDE 20 % ointment Generic drug:  zinc oxide Apply 1 application topically. Apply as needed   ranitidine 150 MG tablet Commonly known as:  ZANTAC Take 150 mg by mouth at bedtime. Take one tablet at bedtime   sennosides-docusate sodium 8.6-50 MG tablet Commonly known as:  SENOKOT-S Take 1 tablet by mouth 3 (three) times daily. Take one tablet three times daily for constipation-hold for loose stools   UTI-STAT Liqd Take 30 mLs by mouth 2 (two) times daily.       Review of Systems  Constitutional: Negative for activity change, appetite change, chills, diaphoresis, fatigue, fever and unexpected weight change.  HENT: Negative for congestion, ear discharge, ear pain, hearing loss, postnasal drip, rhinorrhea, sore throat, tinnitus, trouble swallowing and voice change.   Eyes: Positive for visual disturbance (corrective lenses).  Negative for pain, redness and itching.       Old CVA  Respiratory: Negative for cough, choking, shortness of breath and wheezing.   Cardiovascular: Negative for chest pain, palpitations and leg swelling.  Gastrointestinal: Positive for diarrhea, nausea and vomiting. Negative for abdominal distention, abdominal pain and constipation.       Hx ulcerative colitis; on Lialda.  Endocrine: Negative for cold intolerance, heat intolerance, polydipsia, polyphagia and polyuria.       History of elevated glucose.  Genitourinary: Positive for frequency. Negative for difficulty urinating, dysuria, flank pain, hematuria, pelvic pain, urgency and vaginal discharge.       Nocturia  Musculoskeletal: Positive for back pain and gait problem. Negative for arthralgias, myalgias, neck pain and neck stiffness.       Hx PMR and chronic use of prednisone.  History of osteopenia and kyphosis. 03/10/16- fracture of the distal right radius. In forearm cast. Complains of weakness and discomfort in the low back and increased problems getting out of her recliner chair.  Skin: Negative for color change, pallor and rash.       The right 2nd finger skin cut.   Allergic/Immunologic: Negative.   Neurological: Positive for light-headedness. Negative for dizziness, tremors, seizures, syncope, weakness, numbness and headaches.       History of poliomyelitis at age 40. History of CVA.09/21/15.  Hematological: Negative for adenopathy. Does not bruise/bleed easily.  Psychiatric/Behavioral: Positive for dysphoric mood. Negative for agitation, behavioral problems, confusion, hallucinations, sleep disturbance and suicidal ideas. The patient is nervous/anxious. The patient is not hyperactive.     Immunization History  Administered Date(s) Administered  . Hep A / Hep B 06/08/2013, 06/15/2013, 06/29/2013, 06/12/2014  . Influenza Split 01/01/2014  . Influenza Whole 01/17/2009, 12/02/2010  . Influenza-Unspecified 12/22/2013, 12/21/2014,  01/04/2016  . Pneumococcal Conjugate-13 01/27/2015  . Pneumococcal Polysaccharide-23 07/25/2009  . Td 07/25/2009  . Tdap 12/22/2011   Pertinent  Health Maintenance Due  Topic Date Due  . MAMMOGRAM  07/18/2015  . INFLUENZA VACCINE  Completed  . DEXA SCAN  Completed  . PNA vac Low Risk Adult  Completed   Fall Risk  02/05/2016 11/02/2015 05/01/2015 01/23/2015 07/20/2014  Falls in the past year? Yes No No No No  Number falls in past yr: 1 - - - -  Injury with Fall? No - - - -    Vitals:   02/29/16 1146  BP: (!) 99/57  Pulse: 64  Resp: 18  Temp: 98.3 F (36.8 C)  SpO2: 98%  Weight: 121 lb (54.9 kg)  Height: 5\' 1"  (1.549 m)   Body mass index is 22.86 kg/m. Physical Exam  Constitutional: She is oriented to person, place, and time. She appears well-developed and well-nourished. No distress.  HENT:  Right Ear: External ear normal.  Left Ear: External ear normal.  Nose: Nose normal.  Mouth/Throat: Oropharynx is clear and moist. No oropharyngeal exudate.  Wearing hearing aids bilaterally. EAC and TM normal bilaterally.  Eyes: Conjunctivae and EOM are normal. Pupils are equal, round, and reactive to light. No scleral icterus.  Prescription lenses  Neck: No JVD present. No tracheal deviation present. No thyromegaly present.  Cardiovascular: Normal rate, regular rhythm and intact distal pulses.  Exam reveals no gallop and no friction rub.   Murmur heard. Pulmonary/Chest: Effort normal. No respiratory distress. She has no wheezes. She has no rales. She exhibits no tenderness.  Abdominal: Soft. Bowel sounds are normal. She exhibits no distension and no mass. There is no tenderness.  Large diastasis recti  Musculoskeletal: Normal range of motion. She exhibits no edema or tenderness.  Kyphosis.. Miild gait instability. Cast right forearm. Low back discomfort to palpation.  Lymphadenopathy:    She has no cervical adenopathy.  Neurological: She is alert and oriented to person, place, and  time. No cranial nerve deficit. Coordination normal.  Mild tremor of extended arms. Unsteady when standing.  Skin: No rash noted. She is not diaphoretic. No erythema. No pallor.  The right 2nd finger skin cut.   Psychiatric: She has a normal mood and affect. Her behavior is normal. Judgment and thought content normal.    Labs reviewed:  Recent Labs  11/28/15 0849  11/30/15 1941 12/01/15 0224 12/02/15 1113 12/06/15 12/12/15  NA 131*  < > 127* 132* 134* 134* 135*  K 3.3*  < > 3.3*  3.0* 4.1 4.3 3.9  CL 93*  < > 95* 98* 102  --   --   CO2 27  < > 23 27 27   --   --   GLUCOSE 171*  < > 229* 102* 114*  --   --   BUN 14  < > 14 12 12  30* 30*  CREATININE 0.93  < > 0.97 0.95 1.08* 1.2* 1.0  CALCIUM 8.4*  < > 8.1* 8.2* 8.6*  --   --   MG 2.6*  --  1.8 1.8  --   --   --   < > = values in this interval not displayed.  Recent Labs  11/25/15 0512 11/26/15 1013 11/27/15 0243 12/12/15  AST 31 21 18 13   ALT 20 20 16 14   ALKPHOS 75 64 57 68  BILITOT 1.0 0.8 0.9  --   PROT 7.5 7.3 6.1*  --   ALBUMIN 4.1 4.0 3.4*  --     Recent Labs  09/22/15 0832  09/28/15 1636  11/26/15 1013 11/27/15 0243 12/01/15 0224 12/02/15 1113 12/12/15  WBC 8.7  < > 7.6  < > 13.3* 11.5* 6.5 6.9 4.8  NEUTROABS 7.7  --  6.9  --  11.3*  --   --   --   --   HGB 11.8*  < > 12.7  < > 13.4 12.0 9.8* 10.8* 11.2*  HCT 37.4  < > 40.3  < > 39.1 36.4 30.9* 34.8* 35*  MCV 91.2  < > 93.3  < > 88.1 89.4 92.0 94.1  --   PLT 178  < > 212  < > 209 163 136* 150 205  < > = values in this interval not displayed. Lab Results  Component Value Date   TSH 2.008 09/25/2015   Lab Results  Component Value Date   HGBA1C 6.0 08/23/2015   Lab Results  Component Value Date   CHOL 180 08/23/2015   HDL 73 (A) 08/23/2015   LDLCALC 63 08/23/2015   TRIG 222 (A) 08/23/2015   CHOLHDL 3 11/14/2008    Assessment/Plan  1. Closed traumatic nondisplaced fracture of distal end of right radius with routine healing, subsequent  encounter Healing. Pain is controlled. Seeing Dr. Erlinda Hong.  2. Anemia, unspecified type Continue to monitor lab   3. Chronic combined systolic and diastolic CHF (congestive heart failure) (HCC) The current medical regimen is effective;  continue present plan and medications.  4. Disequilibrium Hinders her ability to safely walk.  5. Essential hypertension controlled  6. Abnormality of gait Must be assisted  7. Urinary tract infection without hematuria, site unspecified resolved  8. Cerebrovascular accident (CVA) due to thrombosis of precerebral artery [facility]) Recovering since June 2017 stroke. Goal is still for her to progress to the point she can safely be moved to [place]. This will not occur until cast on right forearm is removed and she can again start using her walker.

## 2016-03-04 DIAGNOSIS — N390 Urinary tract infection, site not specified: Secondary | ICD-10-CM | POA: Diagnosis not present

## 2016-03-06 ENCOUNTER — Ambulatory Visit: Payer: Medicare Other | Admitting: Cardiovascular Disease

## 2016-03-10 ENCOUNTER — Ambulatory Visit (INDEPENDENT_AMBULATORY_CARE_PROVIDER_SITE_OTHER): Payer: Medicare Other | Admitting: Orthopaedic Surgery

## 2016-03-10 ENCOUNTER — Encounter (INDEPENDENT_AMBULATORY_CARE_PROVIDER_SITE_OTHER): Payer: Self-pay | Admitting: Orthopaedic Surgery

## 2016-03-10 DIAGNOSIS — S52501A Unspecified fracture of the lower end of right radius, initial encounter for closed fracture: Secondary | ICD-10-CM | POA: Diagnosis not present

## 2016-03-10 NOTE — Progress Notes (Signed)
Office Visit Note   Patient: Tammy Beard           Date of Birth: December 25, 1940           MRN: [id number] Visit Date: 03/10/2016              Requested by: [name], MD [address] [place], [place] [address] PCP: Jeanmarie Hubert, MD   Assessment & Plan: Visit Diagnoses:  1. Closed traumatic nondisplaced fracture of distal end of right radius, initial encounter     Plan: I reviewed her x-ray report showed a non-displaced distal radius fracture. I was not able to review the x-ray images itself. Short arm cast for 4 weeks, platform weightbearing follow-up in 4 weeks with 2 view x-rays of the right wrist out of the cast  Follow-Up Instructions: Return in about 4 weeks (around 04/07/2016) for recheck right wrist fx.   Orders:  No orders of the defined types were placed in this encounter.  No orders of the defined types were placed in this encounter.     Procedures: No procedures performed   Clinical Data: No additional findings.   Subjective: Chief Complaint  Patient presents with  . Right Hand - Pain, Injury  . Right Wrist - Pain, Injury    Patient is a 75 year old female who fell out of her wheelchair yesterday and mobile x-ray showed a nondisplaced distal radius fracture. She endorses pain and bruising and swelling is worse with movement of the fingers that does not radiate and throbs. She comes in today for evaluation and treatment.    Review of Systems  Constitutional: Negative.   HENT: Negative.   Eyes: Negative.   Respiratory: Negative.   Cardiovascular: Negative.   Endocrine: Negative.   Musculoskeletal: Negative.   Neurological: Negative.   Hematological: Negative.   Psychiatric/Behavioral: Negative.   All other systems reviewed and are negative.    Objective: Vital Signs: There were no vitals taken for this visit.  Physical Exam  Constitutional: She is oriented to person, place, and time. She appears well-developed and well-nourished.    Pulmonary/Chest: Effort normal.  Neurological: She is alert and oriented to person, place, and time.  Skin: Skin is warm. Capillary refill takes less than 2 seconds.  Psychiatric: She has a normal mood and affect. Her behavior is normal. Judgment and thought content normal.  Nursing note and vitals reviewed.   Ortho Exam Exam of the right wrist shows moderate swelling and bruising. She is neurovascularly intact. Fingers are well perfused. Specialty Comments:  No specialty comments available.  Imaging: No results found.   PMFS History: Patient Active Problem List   Diagnosis Date Noted  . Traumatic closed nondisplaced fracture of distal end of right radius 03/10/2016  . UTI (urinary tract infection) 02/22/2016  . Intracranial vascular stenosis 02/07/2016  . Dehydration 12/20/2015  . CVA, old, disturbances of vision 12/20/2015  . Abnormality of gait 11/27/2015  . Citrobacter infection   . Chronic combined systolic and diastolic CHF (congestive heart failure) ([place])   . GERD (gastroesophageal reflux disease) 11/22/2015  . Malaise 11/22/2015  . Ecchymosis 11/15/2015  . Palpitations 11/02/2015  . Atrial tachycardia ([place]) 09/25/2015  . Acute kidney injury ([place]) 09/21/2015  . Cerebrovascular accident (CVA) ([place])   . Disequilibrium   . Dizzy 09/18/2015  . 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 Diastasis recti 01/23/2015  . Pre-diabetes 03/07/2014  . Heart murmur 03/07/2014  . Polymyalgia rheumatica ([place]) 12/08/2013  . Disturbance in sleep behavior  06/28/2009  . Urinary frequency 06/28/2009  . Dyslipidemia 11/09/2008  . Essential hypertension 06/18/2007  . ALLERGIC RHINITIS 06/18/2007  . Ulcerative colitis ([place]) 06/08/2007  . OSTEOPENIA 06/08/2007  . MURMUR 06/08/2007   Past Medical History:  Diagnosis Date  . Colitis   . Ecchymosis 11/15/2015  . GERD (gastroesophageal reflux disease)   . Heart murmur   . Hyperlipidemia   . Hypertension   . Osteopenia    s/p 7 yrs  Fosamax  . Polio   . Polymyalgia ([place])   . Rheumatic fever   . Sleep disorder   . Stroke ([place])   . Ulcerative colitis     Family History  Problem Relation Age of Onset  . Pancreatic cancer Other   . Other Mother     Coad/ Bronchiectosis  . Diabetes Paternal Grandmother   . Heart failure Paternal Grandmother   . Cancer Paternal Aunt     unaware of primary site  . Coronary artery disease Neg Hx   . Colon cancer Neg Hx   . Rectal cancer Neg Hx   . Stomach cancer Neg Hx   . Heart attack Neg Hx     Past Surgical History:  Procedure Laterality Date  . BREAST LUMPECTOMY  1986   left; benign  . CATARACT EXTRACTION     Left eye   . COLONOSCOPY  2006  . Colonosopy  07/05/13   Dr. Deatra Ina  . DILATION AND CURETTAGE OF UTERUS  12/1999  . MELANOMA EXCISION  2007   left arm  . NECK SURGERY  1986   Benign growth on neck removed  . TONSILLECTOMY     Social History   Occupational History  . retired    Social History Main Topics  . Smoking status: Never Smoker  . Smokeless tobacco: Never Used  . Alcohol use Yes     Comment: occasional once a year  . Drug use: No  . Sexual activity: No

## 2016-03-11 ENCOUNTER — Encounter: Payer: Self-pay | Admitting: Nurse Practitioner

## 2016-03-11 ENCOUNTER — Non-Acute Institutional Stay (SKILLED_NURSING_FACILITY): Payer: Medicare Other | Admitting: Nurse Practitioner

## 2016-03-11 DIAGNOSIS — I10 Essential (primary) hypertension: Secondary | ICD-10-CM

## 2016-03-11 DIAGNOSIS — M353 Polymyalgia rheumatica: Secondary | ICD-10-CM

## 2016-03-11 DIAGNOSIS — K5100 Ulcerative (chronic) pancolitis without complications: Secondary | ICD-10-CM | POA: Diagnosis not present

## 2016-03-11 DIAGNOSIS — R350 Frequency of micturition: Secondary | ICD-10-CM

## 2016-03-11 DIAGNOSIS — K219 Gastro-esophageal reflux disease without esophagitis: Secondary | ICD-10-CM | POA: Diagnosis not present

## 2016-03-11 DIAGNOSIS — I5042 Chronic combined systolic (congestive) and diastolic (congestive) heart failure: Secondary | ICD-10-CM | POA: Diagnosis not present

## 2016-03-11 DIAGNOSIS — S52501D Unspecified fracture of the lower end of right radius, subsequent encounter for closed fracture with routine healing: Secondary | ICD-10-CM

## 2016-03-11 DIAGNOSIS — I471 Supraventricular tachycardia: Secondary | ICD-10-CM

## 2016-03-11 NOTE — Assessment & Plan Note (Signed)
12/07/15 Na 134, K 4.3, Bun 30, creat 1.15 Continue Metoprolol 50mg , Diltiazem 60mg  bid, Lotensin 20/12.5mg   Update CBC CMP

## 2016-03-11 NOTE — Assessment & Plan Note (Signed)
No change since off  [name] 25mg  daily. Observe the patient.

## 2016-03-11 NOTE — Assessment & Plan Note (Signed)
Stable, continue Ranitidine.  

## 2016-03-11 NOTE — Assessment & Plan Note (Signed)
Managed with Prednisone 5mg daily 

## 2016-03-11 NOTE — Assessment & Plan Note (Signed)
Heart rate is in controlled, continue Metoprolol and Diltiazem.

## 2016-03-11 NOTE — Assessment & Plan Note (Signed)
Left-sided colitis, in remission, on lialda, diagnosed 2006 Continue Lialda.

## 2016-03-11 NOTE — Assessment & Plan Note (Signed)
Compensated clinically.  

## 2016-03-11 NOTE — Assessment & Plan Note (Signed)
03/10/16 Ortho, x-ray report showed a non-displaced distal radius fracture. I was not able to review the x-ray images itself. Short arm cast for 4 weeks, platform weightbearing follow-up in 4 weeks with 2 view x-rays of the right wrist out of the cast. Swelling right fingers but denied tingling, numbness, or pain

## 2016-03-11 NOTE — Progress Notes (Signed)
Location:  [facility] Room Number: 75 Place of Service:  SNF (31) Provider:  Camala Talwar, Manxie  NP  Jeanmarie Hubert, MD  Patient Care Team: [name], MD as PCP - General (Internal Medicine) [name], MD as Consulting Physician (Cardiology) [name], MD as Consulting Physician (Ophthalmology) [name], MD as Consulting Physician (Gastroenterology) [name], MD as Consulting Physician (Dermatology)  Extended Emergency Contact Information Primary Emergency Contact: [name]) Address: [place], [place] [address] [name] of [place] Phone: [phone] Mobile Phone: [phone] Relation: Brother Secondary Emergency Contact: [name] [place] of [place] Phone: [phone] Relation: Sister  Code Status:  DNR Goals of care: Advanced Directive information Advanced Directives 03/11/2016  Does Patient Have a Medical Advance Directive? Yes  Type of Paramedic of [place];Living will;Out of facility DNR (pink MOST or yellow form)  Does patient want to make changes to medical advance directive? No - Patient declined  Copy of [facility] in Chart? Yes  Pre-existing out of facility DNR order (yellow form or pink MOST form) Yellow form placed in chart (order not valid for inpatient use)     Chief Complaint  Patient presents with  . Acute Visit    Broke her wrist,    HPI:  Pt is a 75 y.o. female seen today for an acute visit for the right distal radius fx, saw Ortho 03/10/16, short arm cast x 4 weeks, observed swelling fingers, but the patient denied pain, tingling, or numbness in the fingers, she is able to wiggle her fingers.     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 On Aggrenox. Marland Kitchen   Hx pf HTN, controlled on Metoprolol 50mg , Diltiazem 60mg  bid, Lotensin 20/12.5mg  daily. Urinary frequency, no efficacy since [name] 25mg  daily. Stable  ulcerative colitis on Lialda.   Past Medical History:  Diagnosis Date  . Colitis   . Ecchymosis 11/15/2015  . GERD (gastroesophageal reflux disease)   . Heart murmur   . Hyperlipidemia   . Hypertension   . Osteopenia    s/p 7 yrs Fosamax  . Polio   . Polymyalgia ([place])   . Rheumatic fever   . Sleep disorder   . Stroke ([place])   . Ulcerative colitis    Past Surgical History:  Procedure Laterality Date  . BREAST LUMPECTOMY  1986   left; benign  . CATARACT EXTRACTION     Left eye   . COLONOSCOPY  2006  . Colonosopy  07/05/13   Dr. Deatra Ina  . DILATION AND CURETTAGE OF UTERUS  12/1999  . MELANOMA EXCISION  2007   left arm  . NECK SURGERY  1986   Benign growth on neck removed  . TONSILLECTOMY      No Known Allergies  Allergies as of 03/11/2016   No Known Allergies     Medication List       Accurate as of 03/11/16  3:30 PM. Always use your most recent med list.          acetaminophen 325 MG tablet Commonly known as:  TYLENOL Take 650 mg by mouth every 6 (six) hours as needed.   ARTIFICIAL TEAR SOLUTION OP Apply 1 drop to eye daily as needed.   atorvastatin 10 MG tablet Commonly known as:  LIPITOR Take 1 tablet (10 mg total) by mouth daily at 6 PM.   benazepril-hydrochlorthiazide 20-12.5 MG tablet Commonly known as:  LOTENSIN HCT Take 0.5 tablets by mouth daily.   CALCIUM 600+D PLUS MINERALS 600-400 MG-UNIT Tabs Take 2 tablets by mouth 2 (two) times daily.   ceftAZIDime 500 MG injection Commonly known as:  FORTAZ Inject 500 mg into the muscle 2 (two) times daily.   diltiazem 60 MG tablet Commonly known as:  CARDIZEM Take 1 tablet (60 mg total) by mouth every 12 (twelve) hours.   dimenhyDRINATE 50 MG tablet Commonly known as:  DRAMAMINE Take 1 tablet every 6 hours as needed for nausea or dizziness   dipyridamole-aspirin 200-25 MG 12hr capsule Commonly known as:  AGGRENOX Take 1 capsule by mouth 2 (two) times daily.   fluticasone 50 MCG/ACT nasal  spray Commonly known as:  FLONASE Place 1 spray into both nostrils daily as needed for allergies or rhinitis. Reported on 09/28/2015   lactose free nutrition Liqd Take 237 mLs by mouth. Drink one can daily   loratadine 10 MG tablet Commonly known as:  CLARITIN Take 10 mg by mouth. Take one tablet daily   mesalamine 1.2 g EC tablet Commonly known as:  LIALDA take 2 tablets by mouth daily with BREAKFAST   metoprolol 50 MG tablet Commonly known as:  LOPRESSOR Take 1 tablet (50 mg total) by mouth 2 (two) times daily.   multivitamin tablet Take 1 tablet by mouth daily.   MYRBETRIQ 25 MG Tb24 tablet Generic drug:  mirabegron ER Take 25 mg by mouth daily.   predniSONE 5 MG tablet Commonly known as:  DELTASONE TWO TABLETs DAILY TO TREAT POLYMYALGIA RHEUMATICA   RA ZINC OXIDE 20 % ointment Generic drug:  zinc oxide Apply 1 application topically. Apply as needed   ranitidine 150 MG tablet Commonly known as:  ZANTAC Take 150 mg by mouth at bedtime. Take one tablet at bedtime   sennosides-docusate sodium 8.6-50 MG tablet Commonly known as:  SENOKOT-S Take 1 tablet by mouth 3 (three) times daily. Take one tablet three times daily for constipation-hold for loose stools   UTI-STAT Liqd Take 30 mLs by mouth 2 (two) times daily.       Review of Systems  Constitutional: Negative for activity change, appetite change, chills, diaphoresis, fatigue, fever and unexpected weight change.  HENT: Negative for congestion, ear discharge, ear pain, hearing loss, postnasal drip, rhinorrhea, sore throat, tinnitus, trouble swallowing and voice change.   Eyes: Positive for visual disturbance (corrective lenses). Negative for pain, redness and itching.       Old CVA  Respiratory: Negative for cough, choking, shortness of breath and wheezing.   Cardiovascular: Negative for chest pain, palpitations and leg swelling.  Gastrointestinal: Positive for diarrhea, nausea and vomiting. Negative for abdominal  distention, abdominal pain and constipation.       Hx ulcerative colitis; on Lialda.  Endocrine: Negative for cold intolerance, heat intolerance, polydipsia, polyphagia and polyuria.       History of elevated glucose.  Genitourinary: Negative for difficulty urinating, dysuria, flank pain, frequency, hematuria, pelvic pain, urgency and vaginal discharge.  Musculoskeletal: Positive for back pain and gait problem. Negative for arthralgias, myalgias, neck pain and neck stiffness.       Hx PMR and chronic use of prednisone. History of osteopenia and kyphosis  Skin: Negative for color change, pallor and rash.       The right 2nd finger skin cut.   Allergic/Immunologic: Negative.   Neurological: Positive for dizziness and light-headedness. Negative for tremors, seizures, syncope, weakness, numbness and headaches.       History of  poliomyelitis at age 72. History of CVA.09/21/15.  Hematological: Negative for adenopathy. Does not bruise/bleed easily.  Psychiatric/Behavioral: Negative for agitation, behavioral problems, confusion, dysphoric mood, hallucinations, sleep disturbance and suicidal ideas. The patient is not nervous/anxious and is not hyperactive.     Immunization History  Administered Date(s) Administered  . Hep A / Hep B 06/08/2013, 06/15/2013, 06/29/2013, 06/12/2014  . Influenza Split 01/01/2014  . Influenza Whole 01/17/2009, 12/02/2010  . Influenza-Unspecified 12/22/2013, 12/21/2014, 01/04/2016  . Pneumococcal Conjugate-13 01/27/2015  . Pneumococcal Polysaccharide-23 07/25/2009  . Td 07/25/2009  . Tdap 12/22/2011   Pertinent  Health Maintenance Due  Topic Date Due  . MAMMOGRAM  07/18/2015  . INFLUENZA VACCINE  Completed  . DEXA SCAN  Completed  . PNA vac Low Risk Adult  Completed   Fall Risk  02/05/2016 11/02/2015 05/01/2015 01/23/2015 07/20/2014  Falls in the past year? Yes No No No No  Number falls in past yr: 1 - - - -  Injury with Fall? No - - - -   Functional Status  Survey:    Vitals:   03/11/16 1452  BP: 125/63  Pulse: 65  Resp: 18  Temp: 99.7 F (37.6 C)  SpO2: 96%  Weight: 121 lb (54.9 kg)  Height: 5\' 1"  (1.549 m)   Body mass index is 22.86 kg/m. Physical Exam  Constitutional: She is oriented to person, place, and time. She appears well-developed and well-nourished. No distress.  HENT:  Right Ear: External ear normal.  Left Ear: External ear normal.  Nose: Nose normal.  Mouth/Throat: Oropharynx is clear and moist. No oropharyngeal exudate.  Wearing hearing aids bilaterally. EAC and TM normal bilaterally.  Eyes: Conjunctivae and EOM are normal. Pupils are equal, round, and reactive to light. No scleral icterus.  Prescription lenses  Neck: No JVD present. No tracheal deviation present. No thyromegaly present.  Cardiovascular: Normal rate, regular rhythm and intact distal pulses.  Exam reveals no gallop and no friction rub.   Murmur heard. Pulmonary/Chest: Effort normal. No respiratory distress. She has no wheezes. She has no rales. She exhibits no tenderness.  Abdominal: Soft. Bowel sounds are normal. She exhibits no distension and no mass. There is no tenderness.  Large diastasis recti  Musculoskeletal: Normal range of motion. She exhibits edema and tenderness.  Kyphosis. Miild gait instability. Right wrist fx, short arm cast, swelling the right fingers  Lymphadenopathy:    She has no cervical adenopathy.  Neurological: She is alert and oriented to person, place, and time. No cranial nerve deficit. Coordination normal.  Mild tremor of extended arms  Skin: No rash noted. She is not diaphoretic. No erythema. No pallor.  The right 2nd finger skin cut.   Psychiatric: She has a normal mood and affect. Her behavior is normal. Judgment and thought content normal.    Labs reviewed:  Recent Labs  11/28/15 0849  11/30/15 1941 12/01/15 0224 12/02/15 1113 12/06/15 12/12/15  NA 131*  < > 127* 132* 134* 134* 135*  K 3.3*  < > 3.3* 3.0*  4.1 4.3 3.9  CL 93*  < > 95* 98* 102  --   --   CO2 27  < > 23 27 27   --   --   GLUCOSE 171*  < > 229* 102* 114*  --   --   BUN 14  < > 14 12 12  30* 30*  CREATININE 0.93  < > 0.97 0.95 1.08* 1.2* 1.0  CALCIUM 8.4*  < > 8.1* 8.2* 8.6*  --   --  MG 2.6*  --  1.8 1.8  --   --   --   < > = values in this interval not displayed.  Recent Labs  11/25/15 0512 11/26/15 1013 11/27/15 0243 12/12/15  AST 31 21 18 13   ALT 20 20 16 14   ALKPHOS 75 64 57 68  BILITOT 1.0 0.8 0.9  --   PROT 7.5 7.3 6.1*  --   ALBUMIN 4.1 4.0 3.4*  --     Recent Labs  09/22/15 0832  09/28/15 1636  11/26/15 1013 11/27/15 0243 12/01/15 0224 12/02/15 1113 12/12/15  WBC 8.7  < > 7.6  < > 13.3* 11.5* 6.5 6.9 4.8  NEUTROABS 7.7  --  6.9  --  11.3*  --   --   --   --   HGB 11.8*  < > 12.7  < > 13.4 12.0 9.8* 10.8* 11.2*  HCT 37.4  < > 40.3  < > 39.1 36.4 30.9* 34.8* 35*  MCV 91.2  < > 93.3  < > 88.1 89.4 92.0 94.1  --   PLT 178  < > 212  < > 209 163 136* 150 205  < > = values in this interval not displayed. Lab Results  Component Value Date   TSH 2.008 09/25/2015   Lab Results  Component Value Date   HGBA1C 6.0 08/23/2015   Lab Results  Component Value Date   CHOL 180 08/23/2015   HDL 73 (A) 08/23/2015   LDLCALC 63 08/23/2015   TRIG 222 (A) 08/23/2015   CHOLHDL 3 11/14/2008    Significant Diagnostic Results in last 30 days:  No results found.  Assessment/Plan Traumatic closed nondisplaced fracture of distal end of right radius 03/10/16 Ortho, x-ray report showed a non-displaced distal radius fracture. I was not able to review the x-ray images itself. Short arm cast for 4 weeks, platform weightbearing follow-up in 4 weeks with 2 view x-rays of the right wrist out of the cast. 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    Atrial tachycardia (HCC) Heart rate is in controlled, continue Metoprolol and Diltiazem.     Ulcerative colitis ([place]) Left-sided colitis, in remission, on lialda, diagnosed 2006 Continue Lialda.     GERD (gastroesophageal reflux disease) Stable, continue Ranitidine.    Urinary frequency No change since off  [name] 25mg  daily. Observe the patient.    Polymyalgia rheumatica (HCC) Managed with Prednisone 5mg  daily        Family/ staff Communication: SNF  Labs/tests ordered:  CBC CMP

## 2016-03-12 DIAGNOSIS — N390 Urinary tract infection, site not specified: Secondary | ICD-10-CM | POA: Diagnosis not present

## 2016-03-12 DIAGNOSIS — N3946 Mixed incontinence: Secondary | ICD-10-CM | POA: Diagnosis not present

## 2016-03-13 DIAGNOSIS — A498 Other bacterial infections of unspecified site: Secondary | ICD-10-CM | POA: Diagnosis not present

## 2016-03-13 DIAGNOSIS — I10 Essential (primary) hypertension: Secondary | ICD-10-CM | POA: Diagnosis not present

## 2016-03-13 LAB — BASIC METABOLIC PANEL
BUN: 32 mg/dL — AB (ref 4–21)
Creatinine: 1.3 mg/dL — AB (ref <=1.1)
Glucose: 101 mg/dL
POTASSIUM: 4.3 mmol/L (ref 3.4–5.3)
SODIUM: 136 mmol/L — AB (ref 137–147)

## 2016-03-13 LAB — CBC AND DIFFERENTIAL
HEMATOCRIT: 33 % — AB (ref 36–46)
Hemoglobin: 10.8 g/dL — AB (ref 12.0–16.0)
Platelets: 184 10*3/uL (ref 150–399)
WBC: 6 10^3/mL

## 2016-03-13 LAB — HEPATIC FUNCTION PANEL
ALK PHOS: 60 U/L (ref 25–125)
ALT: 14 U/L (ref 7–35)
AST: 14 U/L (ref 13–35)
BILIRUBIN, TOTAL: 0.4 mg/dL

## 2016-03-14 ENCOUNTER — Other Ambulatory Visit: Payer: Self-pay | Admitting: *Deleted

## 2016-03-14 ENCOUNTER — Telehealth (INDEPENDENT_AMBULATORY_CARE_PROVIDER_SITE_OTHER): Payer: Self-pay | Admitting: Orthopaedic Surgery

## 2016-03-14 NOTE — Telephone Encounter (Signed)
Daiva Eves from [facility] called needing the office note faxed to her for DOS  03/10/16. The fax number is [phone]   The phone # is [phone]

## 2016-03-14 NOTE — Telephone Encounter (Signed)
FAXED TO [phone]

## 2016-03-20 ENCOUNTER — Telehealth (INDEPENDENT_AMBULATORY_CARE_PROVIDER_SITE_OTHER): Payer: Self-pay

## 2016-03-20 DIAGNOSIS — D649 Anemia, unspecified: Secondary | ICD-10-CM | POA: Insufficient documentation

## 2016-03-20 NOTE — Telephone Encounter (Signed)
Tammy Beard from [facility] calling asking if she had a "weight bearing" status on her shoulder as well? She states they are trying to train her on a walker.  Tammy Beard callback number is [phone]

## 2016-03-20 NOTE — Telephone Encounter (Signed)
PLEASE ADVISE.

## 2016-03-20 NOTE — Telephone Encounter (Signed)
Platform weight bearing

## 2016-03-21 NOTE — Telephone Encounter (Signed)
Tammy Beard aware of the below message from [employer]

## 2016-03-22 DIAGNOSIS — I10 Essential (primary) hypertension: Secondary | ICD-10-CM | POA: Diagnosis not present

## 2016-03-22 DIAGNOSIS — N179 Acute kidney failure, unspecified: Secondary | ICD-10-CM | POA: Diagnosis not present

## 2016-03-22 DIAGNOSIS — N390 Urinary tract infection, site not specified: Secondary | ICD-10-CM | POA: Diagnosis not present

## 2016-03-23 ENCOUNTER — Inpatient Hospital Stay (HOSPITAL_COMMUNITY)
Admission: EM | Admit: 2016-03-23 | Discharge: 2016-03-27 | DRG: 064 | Disposition: A | Discharge status: Skilled Nursing Facility | Payer: Medicare Other | Attending: Internal Medicine | Admitting: Internal Medicine

## 2016-03-23 ENCOUNTER — Other Ambulatory Visit: Payer: Self-pay | Admitting: *Deleted

## 2016-03-23 ENCOUNTER — Emergency Department (HOSPITAL_COMMUNITY): Payer: Medicare Other

## 2016-03-23 ENCOUNTER — Observation Stay (HOSPITAL_COMMUNITY): Payer: Medicare Other

## 2016-03-23 ENCOUNTER — Encounter (HOSPITAL_COMMUNITY): Payer: Self-pay

## 2016-03-23 DIAGNOSIS — Z79899 Other long term (current) drug therapy: Secondary | ICD-10-CM

## 2016-03-23 DIAGNOSIS — Z8673 Personal history of transient ischemic attack (TIA), and cerebral infarction without residual deficits: Secondary | ICD-10-CM

## 2016-03-23 DIAGNOSIS — I5042 Chronic combined systolic (congestive) and diastolic (congestive) heart failure: Secondary | ICD-10-CM | POA: Diagnosis present

## 2016-03-23 DIAGNOSIS — R29810 Facial weakness: Secondary | ICD-10-CM | POA: Diagnosis not present

## 2016-03-23 DIAGNOSIS — R410 Disorientation, unspecified: Secondary | ICD-10-CM

## 2016-03-23 DIAGNOSIS — I6340 Cerebral infarction due to embolism of unspecified cerebral artery: Secondary | ICD-10-CM | POA: Diagnosis present

## 2016-03-23 DIAGNOSIS — Z7952 Long term (current) use of systemic steroids: Secondary | ICD-10-CM

## 2016-03-23 DIAGNOSIS — Z66 Do not resuscitate: Secondary | ICD-10-CM | POA: Diagnosis not present

## 2016-03-23 DIAGNOSIS — I63411 Cerebral infarction due to embolism of right middle cerebral artery: Secondary | ICD-10-CM | POA: Diagnosis not present

## 2016-03-23 DIAGNOSIS — Z7982 Long term (current) use of aspirin: Secondary | ICD-10-CM

## 2016-03-23 DIAGNOSIS — M6281 Muscle weakness (generalized): Secondary | ICD-10-CM | POA: Diagnosis not present

## 2016-03-23 DIAGNOSIS — E875 Hyperkalemia: Secondary | ICD-10-CM | POA: Diagnosis not present

## 2016-03-23 DIAGNOSIS — K5100 Ulcerative (chronic) pancolitis without complications: Secondary | ICD-10-CM

## 2016-03-23 DIAGNOSIS — M25571 Pain in right ankle and joints of right foot: Secondary | ICD-10-CM | POA: Diagnosis not present

## 2016-03-23 DIAGNOSIS — M8580 Other specified disorders of bone density and structure, unspecified site: Secondary | ICD-10-CM | POA: Diagnosis present

## 2016-03-23 DIAGNOSIS — I10 Essential (primary) hypertension: Secondary | ICD-10-CM | POA: Diagnosis not present

## 2016-03-23 DIAGNOSIS — R531 Weakness: Secondary | ICD-10-CM

## 2016-03-23 DIAGNOSIS — G8194 Hemiplegia, unspecified affecting left nondominant side: Secondary | ICD-10-CM | POA: Diagnosis present

## 2016-03-23 DIAGNOSIS — S52501D Unspecified fracture of the lower end of right radius, subsequent encounter for closed fracture with routine healing: Secondary | ICD-10-CM

## 2016-03-23 DIAGNOSIS — E871 Hypo-osmolality and hyponatremia: Secondary | ICD-10-CM | POA: Diagnosis present

## 2016-03-23 DIAGNOSIS — K5190 Ulcerative colitis, unspecified, without complications: Secondary | ICD-10-CM | POA: Diagnosis present

## 2016-03-23 DIAGNOSIS — I63511 Cerebral infarction due to unspecified occlusion or stenosis of right middle cerebral artery: Secondary | ICD-10-CM | POA: Diagnosis present

## 2016-03-23 DIAGNOSIS — I480 Paroxysmal atrial fibrillation: Secondary | ICD-10-CM | POA: Diagnosis present

## 2016-03-23 DIAGNOSIS — G9340 Encephalopathy, unspecified: Secondary | ICD-10-CM | POA: Diagnosis present

## 2016-03-23 DIAGNOSIS — M6289 Other specified disorders of muscle: Secondary | ICD-10-CM | POA: Diagnosis not present

## 2016-03-23 DIAGNOSIS — R29818 Other symptoms and signs involving the nervous system: Secondary | ICD-10-CM | POA: Diagnosis not present

## 2016-03-23 DIAGNOSIS — N390 Urinary tract infection, site not specified: Secondary | ICD-10-CM | POA: Diagnosis present

## 2016-03-23 DIAGNOSIS — J189 Pneumonia, unspecified organism: Secondary | ICD-10-CM | POA: Diagnosis not present

## 2016-03-23 DIAGNOSIS — I639 Cerebral infarction, unspecified: Secondary | ICD-10-CM

## 2016-03-23 DIAGNOSIS — K219 Gastro-esophageal reflux disease without esophagitis: Secondary | ICD-10-CM | POA: Diagnosis present

## 2016-03-23 DIAGNOSIS — N179 Acute kidney failure, unspecified: Secondary | ICD-10-CM | POA: Diagnosis present

## 2016-03-23 DIAGNOSIS — X58XXXA Exposure to other specified factors, initial encounter: Secondary | ICD-10-CM | POA: Diagnosis present

## 2016-03-23 DIAGNOSIS — B952 Enterococcus as the cause of diseases classified elsewhere: Secondary | ICD-10-CM | POA: Diagnosis present

## 2016-03-23 DIAGNOSIS — M353 Polymyalgia rheumatica: Secondary | ICD-10-CM | POA: Diagnosis present

## 2016-03-23 DIAGNOSIS — E785 Hyperlipidemia, unspecified: Secondary | ICD-10-CM | POA: Diagnosis present

## 2016-03-23 DIAGNOSIS — E876 Hypokalemia: Secondary | ICD-10-CM | POA: Diagnosis present

## 2016-03-23 DIAGNOSIS — E860 Dehydration: Secondary | ICD-10-CM | POA: Diagnosis present

## 2016-03-23 DIAGNOSIS — D72829 Elevated white blood cell count, unspecified: Secondary | ICD-10-CM | POA: Diagnosis not present

## 2016-03-23 DIAGNOSIS — I6601 Occlusion and stenosis of right middle cerebral artery: Secondary | ICD-10-CM | POA: Diagnosis not present

## 2016-03-23 DIAGNOSIS — S52501A Unspecified fracture of the lower end of right radius, initial encounter for closed fracture: Secondary | ICD-10-CM | POA: Diagnosis present

## 2016-03-23 DIAGNOSIS — R402431 Glasgow coma scale score 3-8, in the field [EMT or ambulance]: Secondary | ICD-10-CM | POA: Diagnosis not present

## 2016-03-23 HISTORY — DX: Unspecified fracture of right forearm, initial encounter for closed fracture: S52.91XA

## 2016-03-23 LAB — I-STAT CHEM 8, ED
BUN: 56 mg/dL — ABNORMAL HIGH (ref 6–20)
Calcium, Ion: 1.19 mmol/L (ref 1.15–1.40)
Chloride: 93 mmol/L — ABNORMAL LOW (ref 101–111)
Creatinine, Ser: 1.8 mg/dL — ABNORMAL HIGH (ref 0.44–1.00)
GLUCOSE: 187 mg/dL — AB (ref 65–99)
HCT: 42 % (ref 36.0–46.0)
HEMOGLOBIN: 14.3 g/dL (ref 12.0–15.0)
POTASSIUM: 5.4 mmol/L — AB (ref 3.5–5.1)
Sodium: 130 mmol/L — ABNORMAL LOW (ref 135–145)
TCO2: 31 mmol/L (ref 0–100)

## 2016-03-23 LAB — COMPREHENSIVE METABOLIC PANEL
ALT: 18 U/L (ref 14–54)
ANION GAP: 12 (ref 5–15)
AST: 28 U/L (ref 15–41)
Albumin: 3.7 g/dL (ref 3.5–5.0)
Alkaline Phosphatase: 75 U/L (ref 38–126)
BUN: 38 mg/dL — ABNORMAL HIGH (ref 6–20)
CALCIUM: 10.8 mg/dL — AB (ref 8.9–10.3)
CHLORIDE: 92 mmol/L — AB (ref 101–111)
CO2: 27 mmol/L (ref 22–32)
Creatinine, Ser: 1.7 mg/dL — ABNORMAL HIGH (ref 0.44–1.00)
GFR calc non Af Amer: 28 mL/min — ABNORMAL LOW (ref >60)
GFR, EST AFRICAN AMERICAN: 33 mL/min — AB (ref >60)
Glucose, Bld: 186 mg/dL — ABNORMAL HIGH (ref 65–99)
POTASSIUM: 5.3 mmol/L — AB (ref 3.5–5.1)
SODIUM: 131 mmol/L — AB (ref 135–145)
Total Bilirubin: 0.8 mg/dL (ref 0.3–1.2)
Total Protein: 7.9 g/dL (ref 6.5–8.1)

## 2016-03-23 LAB — DIFFERENTIAL
BASOS PCT: 0 %
Basophils Absolute: 0 10*3/uL (ref 0.0–0.1)
EOS ABS: 0 10*3/uL (ref 0.0–0.7)
EOS PCT: 0 %
Lymphocytes Relative: 12 %
Lymphs Abs: 1.2 10*3/uL (ref 0.7–4.0)
MONO ABS: 0.6 10*3/uL (ref 0.1–1.0)
Monocytes Relative: 6 %
Neutro Abs: 8.6 10*3/uL — ABNORMAL HIGH (ref 1.7–7.7)
Neutrophils Relative %: 82 %

## 2016-03-23 LAB — CBC
HCT: 42 % (ref 36.0–46.0)
Hemoglobin: 14.1 g/dL (ref 12.0–15.0)
MCH: 31.3 pg (ref 26.0–34.0)
MCHC: 33.6 g/dL (ref 30.0–36.0)
MCV: 93.3 fL (ref 78.0–100.0)
PLATELETS: 187 10*3/uL (ref 150–400)
RBC: 4.5 MIL/uL (ref 3.87–5.11)
RDW: 13 % (ref 11.5–15.5)
WBC: 10.4 10*3/uL (ref 4.0–10.5)

## 2016-03-23 LAB — I-STAT TROPONIN, ED: Troponin i, poc: 0.04 ng/mL (ref 0.00–0.08)

## 2016-03-23 LAB — URINALYSIS, ROUTINE W REFLEX MICROSCOPIC
Bilirubin Urine: NEGATIVE
GLUCOSE, UA: NEGATIVE mg/dL
Hgb urine dipstick: NEGATIVE
KETONES UR: NEGATIVE mg/dL
Nitrite: NEGATIVE
PH: 6 (ref 5.0–8.0)
Protein, ur: NEGATIVE mg/dL
Specific Gravity, Urine: 1.014 (ref 1.005–1.030)

## 2016-03-23 LAB — BASIC METABOLIC PANEL
BUN: 35 mg/dL — AB (ref 4–21)
Creatinine: 1.3 mg/dL — AB (ref <=1.1)
GLUCOSE: 99 mg/dL
POTASSIUM: 4 mmol/L (ref 3.4–5.3)
SODIUM: 134 mmol/L — AB (ref 137–147)

## 2016-03-23 LAB — CBG MONITORING, ED: GLUCOSE-CAPILLARY: 189 mg/dL — AB (ref 65–99)

## 2016-03-23 LAB — APTT: aPTT: 25 seconds (ref 24–36)

## 2016-03-23 LAB — PROTIME-INR
INR: 0.95
PROTHROMBIN TIME: 12.6 s (ref 11.4–15.2)

## 2016-03-23 MED ORDER — HEPARIN SODIUM (PORCINE) 5000 UNIT/ML IJ SOLN
5000.0000 [IU] | Freq: Three times a day (TID) | INTRAMUSCULAR | Status: DC
  Administered 2016-03-24 – 2016-03-26 (×8): 5000 [IU] via SUBCUTANEOUS
  Filled 2016-03-23 (×7): qty 1

## 2016-03-23 MED ORDER — BOOST PO LIQD
237.0000 mL | Freq: Every evening | ORAL | Status: DC
  Administered 2016-03-26: 237 mL via ORAL
  Filled 2016-03-23 (×4): qty 237

## 2016-03-23 MED ORDER — CALCIUM CARBONATE-VITAMIN D 500-200 MG-UNIT PO TABS
2.0000 | ORAL_TABLET | Freq: Two times a day (BID) | ORAL | Status: DC
  Administered 2016-03-24 – 2016-03-27 (×7): 2 via ORAL
  Filled 2016-03-23 (×7): qty 2

## 2016-03-23 MED ORDER — FAMOTIDINE IN NACL 20-0.9 MG/50ML-% IV SOLN
20.0000 mg | Freq: Two times a day (BID) | INTRAVENOUS | Status: DC
  Administered 2016-03-24 (×2): 20 mg via INTRAVENOUS
  Filled 2016-03-23 (×2): qty 50

## 2016-03-23 MED ORDER — SENNOSIDES-DOCUSATE SODIUM 8.6-50 MG PO TABS
1.0000 | ORAL_TABLET | Freq: Three times a day (TID) | ORAL | Status: DC
  Administered 2016-03-24 – 2016-03-27 (×8): 1 via ORAL
  Filled 2016-03-23 (×9): qty 1

## 2016-03-23 MED ORDER — LORATADINE 10 MG PO TABS
10.0000 mg | ORAL_TABLET | Freq: Every day | ORAL | Status: DC
  Administered 2016-03-24 – 2016-03-27 (×2): 10 mg via ORAL
  Filled 2016-03-23 (×3): qty 1

## 2016-03-23 MED ORDER — ASPIRIN-DIPYRIDAMOLE ER 25-200 MG PO CP12
1.0000 | ORAL_CAPSULE | Freq: Two times a day (BID) | ORAL | Status: DC
  Administered 2016-03-24: 1 via ORAL
  Filled 2016-03-23: qty 1

## 2016-03-23 MED ORDER — MIRABEGRON ER 25 MG PO TB24
25.0000 mg | ORAL_TABLET | Freq: Every day | ORAL | Status: DC
  Administered 2016-03-24: 25 mg via ORAL
  Filled 2016-03-23 (×3): qty 1

## 2016-03-23 MED ORDER — DILTIAZEM HCL 30 MG PO TABS
60.0000 mg | ORAL_TABLET | Freq: Two times a day (BID) | ORAL | Status: DC
  Administered 2016-03-24 – 2016-03-25 (×3): 60 mg via ORAL
  Filled 2016-03-23 (×2): qty 1
  Filled 2016-03-23: qty 2
  Filled 2016-03-23: qty 1
  Filled 2016-03-23 (×2): qty 2
  Filled 2016-03-23 (×2): qty 1
  Filled 2016-03-23: qty 2

## 2016-03-23 MED ORDER — ACETAMINOPHEN 650 MG RE SUPP: 650.0000 mg | RECTAL | Status: DC | PRN

## 2016-03-23 MED ORDER — ACETAMINOPHEN 160 MG/5ML PO SOLN
650.0000 mg | ORAL | Status: DC | PRN
  Administered 2016-03-24: 650 mg
  Filled 2016-03-23: qty 20.3

## 2016-03-23 MED ORDER — ACETAMINOPHEN 325 MG PO TABS
650.0000 mg | ORAL_TABLET | ORAL | Status: DC | PRN
  Administered 2016-03-25 – 2016-03-27 (×2): 650 mg via ORAL
  Filled 2016-03-23 (×2): qty 2

## 2016-03-23 MED ORDER — SODIUM CHLORIDE 0.9 % IV BOLUS (SEPSIS)
500.0000 mL | Freq: Once | INTRAVENOUS | Status: AC
  Administered 2016-03-23: 500 mL via INTRAVENOUS

## 2016-03-23 MED ORDER — MESALAMINE 1.2 G PO TBEC
2.4000 g | DELAYED_RELEASE_TABLET | Freq: Every day | ORAL | Status: DC
  Filled 2016-03-23 (×3): qty 2

## 2016-03-23 MED ORDER — CALCIUM 600+D PLUS MINERALS 600-400 MG-UNIT PO TABS: 2.0000 | ORAL_TABLET | Freq: Two times a day (BID) | ORAL | Status: DC

## 2016-03-23 MED ORDER — SODIUM CHLORIDE 0.9 % IV SOLN
INTRAVENOUS | Status: AC
  Administered 2016-03-24: via INTRAVENOUS

## 2016-03-23 MED ORDER — ATORVASTATIN CALCIUM 10 MG PO TABS
10.0000 mg | ORAL_TABLET | Freq: Every day | ORAL | Status: DC
  Administered 2016-03-24 – 2016-03-26 (×3): 10 mg via ORAL
  Filled 2016-03-23 (×4): qty 1

## 2016-03-23 MED ORDER — DEXTROSE 5 % IV SOLN
1.0000 g | INTRAVENOUS | Status: DC
  Administered 2016-03-24 – 2016-03-26 (×3): 1 g via INTRAVENOUS
  Filled 2016-03-23 (×4): qty 1

## 2016-03-23 MED ORDER — PREDNISONE 5 MG PO TABS: 10.0000 mg | ORAL_TABLET | Freq: Every day | ORAL | Status: DC

## 2016-03-23 MED ORDER — DEXTROSE 5 % IV SOLN
1.0000 g | Freq: Once | INTRAVENOUS | Status: AC
  Administered 2016-03-23: 1 g via INTRAVENOUS
  Filled 2016-03-23: qty 10

## 2016-03-23 MED ORDER — METOPROLOL TARTRATE 25 MG PO TABS: 25.0000 mg | ORAL_TABLET | Freq: Two times a day (BID) | ORAL | Status: DC

## 2016-03-23 NOTE — ED Notes (Signed)
Pt to MRI

## 2016-03-23 NOTE — ED Notes (Signed)
Patient transported to X-ray 

## 2016-03-23 NOTE — ED Provider Notes (Signed)
[facility] DEPT Provider Note   CSN: [id number] Arrival date & time: 03/23/16  1745   An emergency department physician performed an initial assessment on this suspected stroke patient at 1746.  History   Chief Complaint Chief Complaint  Patient presents with  . Code Stroke    HPI Tammy Beard is a 75 y.o. female.   Altered Mental Status   This is a new problem. The current episode started yesterday. The problem has been gradually worsening. Associated symptoms include confusion and weakness. Risk factors include a recent illness and a recent infection.    Past Medical History:  Diagnosis Date  . Colitis   . Ecchymosis 11/15/2015  . GERD (gastroesophageal reflux disease)   . Heart murmur   . Hyperlipidemia   . Hypertension   . Osteopenia    s/p 7 yrs Fosamax  . Polio   . Polymyalgia ([place])   . Rheumatic fever   . Sleep disorder   . Stroke ([place])   . Ulcerative colitis     Patient Active Problem List   Diagnosis Date Noted  . Acute left-sided weakness 03/23/2016  . Acute encephalopathy 03/23/2016  . Hyperkalemia 03/23/2016  . Anemia 03/20/2016  . Traumatic closed nondisplaced fracture of distal end of right radius 03/10/2016  . UTI (urinary tract infection) 02/22/2016  . Intracranial vascular stenosis 02/07/2016  . Dehydration 12/20/2015  . CVA, old, disturbances of vision 12/20/2015  . Abnormality of gait 11/27/2015  . Citrobacter infection   . GERD (gastroesophageal reflux disease) 11/22/2015  . Malaise 11/22/2015  . Hyponatremia 11/22/2015  . Ecchymosis 11/15/2015  . Palpitations 11/02/2015  . Atrial tachycardia ([place]) 09/25/2015  . Acute kidney injury ([place]) 09/21/2015  . History of CVA (cerebrovascular accident)   . Disequilibrium   . Dizzy 09/18/2015  . 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 Diastasis recti 01/23/2015  . Pre-diabetes 03/07/2014  . Heart murmur 03/07/2014  . Polymyalgia rheumatica ([place]) 12/08/2013  . Disturbance in sleep  behavior 06/28/2009  . Urinary frequency 06/28/2009  . Dyslipidemia 11/09/2008  . Essential hypertension 06/18/2007  . ALLERGIC RHINITIS 06/18/2007  . Ulcerative colitis ([place]) 06/08/2007  . OSTEOPENIA 06/08/2007  . MURMUR 06/08/2007    Past Surgical History:  Procedure Laterality Date  . BREAST LUMPECTOMY  1986   left; benign  . CATARACT EXTRACTION     Left eye   . COLONOSCOPY  2006  . Colonosopy  07/05/13   Dr. Deatra Ina  . DILATION AND CURETTAGE OF UTERUS  12/1999  . MELANOMA EXCISION  2007   left arm  . NECK SURGERY  1986   Benign growth on neck removed  . TONSILLECTOMY      OB History    No data available       Home Medications    Prior to Admission medications   Medication Sig Start Date End Date Taking? Authorizing Provider  acetaminophen (TYLENOL) 325 MG tablet Take 650 mg by mouth every 6 (six) hours as needed.   Yes Historical Provider, MD  ARTIFICIAL TEAR SOLUTION OP Apply 1 drop to eye daily as needed (dry eyes).    Yes Historical Provider, MD  atorvastatin (LIPITOR) 10 MG tablet Take 1 tablet (10 mg total) by mouth daily at 6 PM. 09/26/15  Yes Ripudeep K Rai, MD  benazepril-hydrochlorthiazide (LOTENSIN HCT) 20-12.5 MG tablet Take 0.5 tablets by mouth daily.   Yes Historical Provider, MD  Calcium Carbonate-Vit D-Min (CALCIUM 600+D PLUS MINERALS) 600-400 MG-UNIT TABS Take 2 tablets by mouth 2 (two)  times daily.    Yes Historical Provider, MD  Cranberry-Vitamin C-Inulin (UTI-STAT) LIQD Take 30 mLs by mouth 2 (two) times daily.   Yes Historical Provider, MD  diltiazem (CARDIZEM) 60 MG tablet Take 1 tablet (60 mg total) by mouth every 12 (twelve) hours. 12/03/15  Yes Mariel Aloe, MD  dimenhyDRINATE (DRAMAMINE) 50 MG tablet Take 1 tablet every 6 hours as needed for nausea or dizziness 09/18/15  Yes [name], MD  dipyridamole-aspirin (AGGRENOX) 200-25 MG 12hr capsule Take 1 capsule by mouth 2 (two) times daily. 12/20/15  Yes Carmen Dohmeier, MD  lactose free  nutrition (BOOST) LIQD Take 237 mLs by mouth every evening. Drink one can daily    Yes Historical Provider, MD  loratadine (CLARITIN) 10 MG tablet Take 10 mg by mouth. Take one tablet daily   Yes Historical Provider, MD  mesalamine (LIALDA) 1.2 g EC tablet take 2 tablets by mouth daily with BREAKFAST 04/26/15  Yes [name], MD  metoprolol (LOPRESSOR) 50 MG tablet Take 1 tablet (50 mg total) by mouth 2 (two) times daily. Patient taking differently: Take 25 mg by mouth 2 (two) times daily.  12/03/15  Yes Mariel Aloe, MD  mirabegron ER (MYRBETRIQ) 25 MG TB24 tablet Take 25 mg by mouth daily.   Yes Historical Provider, MD  Multiple Vitamin (MULTIVITAMIN) tablet Take 1 tablet by mouth daily.     Yes Historical Provider, MD  predniSONE (DELTASONE) 5 MG tablet TWO TABLETs DAILY TO TREAT POLYMYALGIA RHEUMATICA Patient taking differently: Take 10 mg by mouth daily with breakfast. TWO TABLETs DAILY TO TREAT POLYMYALGIA RHEUMATICA 08/28/15  Yes [name], MD  ranitidine (ZANTAC) 150 MG tablet Take 150 mg by mouth at bedtime. Take one tablet at bedtime    Yes Historical Provider, MD  sennosides-docusate sodium (SENOKOT-S) 8.6-50 MG tablet Take 1 tablet by mouth 3 (three) times daily. Take one tablet three times daily for constipation-hold for loose stools    Yes Historical Provider, MD  trimethoprim (TRIMPEX) 100 MG tablet Take 100 mg by mouth at bedtime.   Yes Historical Provider, MD  zinc oxide (RA ZINC OXIDE) 20 % ointment Apply 1 application topically. Apply as needed   Yes Historical Provider, MD    Family History Family History  Problem Relation Age of Onset  . Pancreatic cancer Other   . Other Mother     Coad/ Bronchiectosis  . Diabetes Paternal Grandmother   . Heart failure Paternal Grandmother   . Cancer Paternal Aunt     unaware of primary site  . Coronary artery disease Neg Hx   . Colon cancer Neg Hx   . Rectal cancer Neg Hx   . Stomach cancer Neg Hx   . Heart attack Neg Hx       Social History Social History  Substance Use Topics  . Smoking status: Never Smoker  . Smokeless tobacco: Never Used  . Alcohol use Yes     Comment: occasional once a year     Allergies   Patient has no known allergies.   Review of Systems Review of Systems  Neurological: Positive for weakness.  Psychiatric/Behavioral: Positive for confusion.  All other systems reviewed and are negative.    Physical Exam Updated Vital Signs BP 153/65   Pulse 88   Temp 98.5 F (36.9 C)   Resp 18   Ht 5\' 1"  (1.549 m)   Wt 119 lb (54 kg)   SpO2 99%   BMI 22.48 kg/m  Physical Exam  Constitutional: She appears well-developed and well-nourished.  HENT:  Head: Normocephalic and atraumatic.  Hard of hearing  Eyes: Conjunctivae and EOM are normal.  Neck: Normal range of motion.  Cardiovascular: Normal rate and regular rhythm.   No murmur heard. Pulmonary/Chest: Effort normal. No stridor. No respiratory distress.  Abdominal: Soft. She exhibits no distension.  Musculoskeletal: Normal range of motion. She exhibits no edema or deformity.  Neurological: She is alert.  Left facial droop Strabismus  Skin: Skin is warm and dry.  Nursing note and vitals reviewed.    ED Treatments / Results  Labs (all labs ordered are listed, but only abnormal results are displayed) Labs Reviewed  DIFFERENTIAL - Abnormal; Notable for the following:       Result Value   Neutro Abs 8.6 (*)    All other components within normal limits  COMPREHENSIVE METABOLIC PANEL - Abnormal; Notable for the following:    Sodium 131 (*)    Potassium 5.3 (*)    Chloride 92 (*)    Glucose, Bld 186 (*)    BUN 38 (*)    Creatinine, Ser 1.70 (*)    Calcium 10.8 (*)    GFR calc non Af Amer 28 (*)    GFR calc Af Amer 33 (*)    All other components within normal limits  URINALYSIS, ROUTINE W REFLEX MICROSCOPIC - Abnormal; Notable for the following:    APPearance HAZY (*)    Leukocytes, UA TRACE (*)    Bacteria,  UA MANY (*)    Squamous Epithelial / LPF 0-5 (*)    All other components within normal limits  CBG MONITORING, ED - Abnormal; Notable for the following:    Glucose-Capillary 189 (*)    All other components within normal limits  I-STAT CHEM 8, ED - Abnormal; Notable for the following:    Sodium 130 (*)    Potassium 5.4 (*)    Chloride 93 (*)    BUN 56 (*)    Creatinine, Ser 1.80 (*)    Glucose, Bld 187 (*)    All other components within normal limits  URINE CULTURE  PROTIME-INR  APTT  CBC  HEMOGLOBIN A1C  LIPID PANEL  TSH  AMMONIA  RPR  VITAMIN B12  HIV ANTIBODY (ROUTINE TESTING)  CREATININE, URINE, RANDOM  UREA NITROGEN, URINE  SODIUM, URINE, RANDOM  POTASSIUM  POTASSIUM  I-STAT TROPOININ, ED    EKG  EKG Interpretation None       Radiology Dg Chest 2 View  Result Date: 03/23/2016 CLINICAL DATA:  Evaluate for pneumonia EXAM: CHEST  2 VIEW COMPARISON:  11/26/2015 FINDINGS: Normal mediastinum and cardiac silhouette. Normal pulmonary vasculature. No evidence of effusion, infiltrate, or pneumothorax. No acute bony abnormality. IMPRESSION: No acute cardiopulmonary process. Electronically Signed   By: Suzy Bouchard M.D.   On: 03/23/2016 19:16   Ct Head Code Stroke W/o Cm  Result Date: 03/23/2016 CLINICAL DATA:  Code stroke. Right-sided weakness. Last seen normal 3 hours ago. EXAM: CT HEAD WITHOUT CONTRAST TECHNIQUE: Contiguous axial images were obtained from the base of the skull through the vertex without intravenous contrast. COMPARISON:  12/21/2015 and multiple previous.  MRI 09/21/2015. FINDINGS: Brain: There is old infarction in the right parietal cortical and subcortical brain. No sign of acute infarction, mass lesion, hemorrhage, hydrocephalus or extra-axial collection. Mild small vessel changes of the white matter. Vascular: There is atherosclerotic calcification of the major vessels at the base of the brain. Skull: Negative Sinuses/Orbits: Sinuses are  clear.  Small mastoid effusions. Orbits negative. Other: None ASPECTS ([place] Stroke Program Early CT Score) - Ganglionic level infarction (caudate, lentiform nuclei, internal capsule, insula, M1-M3 cortex): 6 - Supraganglionic infarction (M4-M6 cortex): 3 Total score (0-10 with 10 being normal): 9 IMPRESSION: 1. No acute infarction suspected by CT. Old infarction in the right posterior parietal cortical and subcortical brain. 2. ASPECTS is 9, though the abnormality in M 6 is likely chronic. These results were paged by telephone at the time of interpretation on 03/23/2016 at 6:04 pm to Dr. Cheral Marker, awaiting response. Electronically Signed   By: Nelson Chimes M.D.   On: 03/23/2016 18:06    Procedures Procedures (including critical care time)  Medications Ordered in ED Medications  mirabegron ER (MYRBETRIQ) tablet 25 mg (not administered)  lactose free nutrition (Boost) liquid 237 mL (not administered)  loratadine (CLARITIN) tablet 10 mg (not administered)  dipyridamole-aspirin (AGGRENOX) 200-25 MG per 12 hr capsule 1 capsule (not administered)  diltiazem (CARDIZEM) tablet 60 mg (not administered)  metoprolol tartrate (LOPRESSOR) tablet 25 mg (not administered)  CALCIUM 600+D PLUS MINERALS 600-400 MG-UNIT TABS 2 tablet (not administered)  famotidine (PEPCID) IVPB 20 mg premix (not administered)  sennosides-docusate sodium (SENOKOT-S) 8.6-50 MG tablet 1 tablet (not administered)  atorvastatin (LIPITOR) tablet 10 mg (not administered)  predniSONE (DELTASONE) tablet 10 mg (not administered)  mesalamine (LIALDA) EC tablet 2.4 g (not administered)  0.9 %  sodium chloride infusion (not administered)  acetaminophen (TYLENOL) tablet 650 mg (not administered)    Or  acetaminophen (TYLENOL) solution 650 mg (not administered)    Or  acetaminophen (TYLENOL) suppository 650 mg (not administered)  heparin injection 5,000 Units (not administered)  ceFEPIme (MAXIPIME) 1 g in dextrose 5 % 50 mL IVPB (not  administered)  cefTRIAXone (ROCEPHIN) 1 g in dextrose 5 % 50 mL IVPB (0 g Intravenous Stopped 03/23/16 2221)  sodium chloride 0.9 % bolus 500 mL (500 mLs Intravenous Transfusing/Transfer 03/23/16 2340)     Initial Impression / Assessment and Plan / ED Course  I have reviewed the triage vital signs and the nursing notes.  Pertinent labs & imaging results that were available during my care of the patient were reviewed by me and considered in my medical decision making (see chart for details).  Clinical Course     Here with more confusion/AMS/sleepiness with worsened L facial droop and strabismus. Ct ok. Workup c/w likely UTI. Rocephin given, will admit to medicine for continued workup and ensure improvement.   Final Clinical Impressions(s) / ED Diagnoses   Final diagnoses:  Acute left-sided weakness    New Prescriptions New Prescriptions   No medications on file     Merrily Pew, MD 03/23/16 2342
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
## 2016-03-23 NOTE — Progress Notes (Signed)
Pharmacy Antibiotic Note  Tammy Beard is a 75 y.o. female admitted on 03/23/2016 with UTI.  Pharmacy has been consulted for cefepime dosing.  Wt 54 kg, WBC 10.4, creat 1.8, temp 99.4.  She was given Rocephin 1 gm tonight at 2139. Creat cl ~ 23 ml/min.   Plan:  Cefepime 1 gm IV q24  Height: 5\' 1"  (154.9 cm) Weight: 119 lb (54 kg) IBW/kg (Calculated) : 47.8  Temp (24hrs), Avg:99.4 F (37.4 C), Min:99.4 F (37.4 C), Max:99.4 F (37.4 C)   Recent Labs Lab 03/23/16 1750 03/23/16 1800  WBC 10.4  --   CREATININE 1.70* 1.80*    Estimated Creatinine Clearance: 20.1 mL/min (by C-G formula based on SCr of 1.8 mg/dL (H)).    No Known Allergies  Eudelia Bunch, Pharm.D. [id number] 03/23/2016 10:43 PM

## 2016-03-23 NOTE — ED Notes (Signed)
ED Provider at bedside. 

## 2016-03-23 NOTE — H&P (Signed)
History and Physical    Tammy Beard [id number] DOB: 1940/05/03 DOA: 03/23/2016  PCP: Jeanmarie Hubert, MD   Patient coming from: Nursing home  Chief Complaint: Altered, left facial droop, left-sided weakness  HPI: Tammy Beard is a 75 y.o. female with medical history significant for history of CVA, ulcerative colitis, polymyalgia rheumatica, hypertension, and GERD who presents to the emergency department from her nursing home for evaluation of altered mental status and acute left-sided weakness. Patient has had a series of recent illnesses, including UTI treated at her nursing facility, and fracture of the right radius. She had, by report, otherwise been in her usual state until sometime yesterday afternoon when she was noted to be confused. Confusion was also noted by nursing staff when the patient woke this morning at approximately 8 AM. Later in the day, at approximately 3 PM, the patient was noted to have a left-sided facial droop and was not moving her left side. Patient's brother reports that she has had a very similar presentation in the past with acute illness. There's been no recent head trauma reported, patient has not been noted to have vomiting or diarrhea, and no fevers have been recorded. Patient is unable to provide much history at this time, which is therefore obtained through discussion with the ED personnel, nursing facility personnel, reported the patient's brother, and review of the EMR.  ED Course: Upon arrival to the ED, patient is found to be afebrile, saturating well on room air, and with vital signs otherwise stable. EKG features a sinus rhythm with PACs and chest x-rays negative for acute cardiopulmonary disease. Chemistry panels notable for hyponatremia to 131, mild hyperkalemia with potassium 5.3, BUN of 38, and a serum creatinine of 1.70, up from an apparent baseline of 1. CBC is unremarkable and urinalysis suggests possible infection. INR and troponin are both  within the normal limits. Head CT was obtained without contrast and is negative for acute intracranial abnormality, but notable for old infarction in the posterior right parietal lobe. Neurology was consulted by the ED physician, evaluated the patient in the emergency department, and suspects a toxic metabolic encephalopathy rather than acute CVA, but advises a medical admission for further evaluation and management of this. Patient has remained hemodynamically stable in the ED and in no apparent respiratory distress. She'll be observed on the telemetry unit for ongoing evaluation and management of acute encephalopathy with left facial droop and left-sided weakness, possibly secondary to acute CVA versus UTI/toxic metabolic encephalopathy.  Review of Systems:  All other systems reviewed and apart from HPI, are negative.  Past Medical History:  Diagnosis Date  . Colitis   . Ecchymosis 11/15/2015  . GERD (gastroesophageal reflux disease)   . Heart murmur   . Hyperlipidemia   . Hypertension   . Osteopenia    s/p 7 yrs Fosamax  . Polio   . Polymyalgia ([place])   . Rheumatic fever   . Sleep disorder   . Stroke ([place])   . Ulcerative colitis     Past Surgical History:  Procedure Laterality Date  . BREAST LUMPECTOMY  1986   left; benign  . CATARACT EXTRACTION     Left eye   . COLONOSCOPY  2006  . Colonosopy  07/05/13   Dr. Deatra Ina  . DILATION AND CURETTAGE OF UTERUS  12/1999  . MELANOMA EXCISION  2007   left arm  . NECK SURGERY  1986   Benign growth on neck removed  . TONSILLECTOMY  reports that she has never smoked. She has never used smokeless tobacco. She reports that she drinks alcohol. She reports that she does not use drugs.  No Known Allergies  Family History  Problem Relation Age of Onset  . Pancreatic cancer Other   . Other Mother     Coad/ Bronchiectosis  . Diabetes Paternal Grandmother   . Heart failure Paternal Grandmother   . Cancer Paternal Aunt     unaware of  primary site  . Coronary artery disease Neg Hx   . Colon cancer Neg Hx   . Rectal cancer Neg Hx   . Stomach cancer Neg Hx   . Heart attack Neg Hx      Prior to Admission medications   Medication Sig Start Date End Date Taking? Authorizing Provider  acetaminophen (TYLENOL) 325 MG tablet Take 650 mg by mouth every 6 (six) hours as needed.   Yes Historical Provider, MD  ARTIFICIAL TEAR SOLUTION OP Apply 1 drop to eye daily as needed (dry eyes).    Yes Historical Provider, MD  atorvastatin (LIPITOR) 10 MG tablet Take 1 tablet (10 mg total) by mouth daily at 6 PM. 09/26/15  Yes Ripudeep K Rai, MD  benazepril-hydrochlorthiazide (LOTENSIN HCT) 20-12.5 MG tablet Take 0.5 tablets by mouth daily.   Yes Historical Provider, MD  Calcium Carbonate-Vit D-Min (CALCIUM 600+D PLUS MINERALS) 600-400 MG-UNIT TABS Take 2 tablets by mouth 2 (two) times daily.    Yes Historical Provider, MD  Cranberry-Vitamin C-Inulin (UTI-STAT) LIQD Take 30 mLs by mouth 2 (two) times daily.   Yes Historical Provider, MD  diltiazem (CARDIZEM) 60 MG tablet Take 1 tablet (60 mg total) by mouth every 12 (twelve) hours. 12/03/15  Yes Mariel Aloe, MD  dimenhyDRINATE (DRAMAMINE) 50 MG tablet Take 1 tablet every 6 hours as needed for nausea or dizziness 09/18/15  Yes [name], MD  dipyridamole-aspirin (AGGRENOX) 200-25 MG 12hr capsule Take 1 capsule by mouth 2 (two) times daily. 12/20/15  Yes Carmen Dohmeier, MD  lactose free nutrition (BOOST) LIQD Take 237 mLs by mouth every evening. Drink one can daily    Yes Historical Provider, MD  loratadine (CLARITIN) 10 MG tablet Take 10 mg by mouth. Take one tablet daily   Yes Historical Provider, MD  mesalamine (LIALDA) 1.2 g EC tablet take 2 tablets by mouth daily with BREAKFAST 04/26/15  Yes [name], MD  metoprolol (LOPRESSOR) 50 MG tablet Take 1 tablet (50 mg total) by mouth 2 (two) times daily. Patient taking differently: Take 25 mg by mouth 2 (two) times daily.  12/03/15  Yes  Mariel Aloe, MD  mirabegron ER (MYRBETRIQ) 25 MG TB24 tablet Take 25 mg by mouth daily.   Yes Historical Provider, MD  Multiple Vitamin (MULTIVITAMIN) tablet Take 1 tablet by mouth daily.     Yes Historical Provider, MD  predniSONE (DELTASONE) 5 MG tablet TWO TABLETs DAILY TO TREAT POLYMYALGIA RHEUMATICA Patient taking differently: Take 10 mg by mouth daily with breakfast. TWO TABLETs DAILY TO TREAT POLYMYALGIA RHEUMATICA 08/28/15  Yes [name], MD  ranitidine (ZANTAC) 150 MG tablet Take 150 mg by mouth at bedtime. Take one tablet at bedtime    Yes Historical Provider, MD  sennosides-docusate sodium (SENOKOT-S) 8.6-50 MG tablet Take 1 tablet by mouth 3 (three) times daily. Take one tablet three times daily for constipation-hold for loose stools    Yes Historical Provider, MD  trimethoprim (TRIMPEX) 100 MG tablet Take 100 mg by mouth at bedtime.  Yes Historical Provider, MD  zinc oxide (RA ZINC OXIDE) 20 % ointment Apply 1 application topically. Apply as needed   Yes Historical Provider, MD    Physical Exam: Vitals:   03/23/16 1845 03/23/16 1932 03/23/16 2000 03/23/16 2030  BP: 169/72  128/69 165/92  Pulse: (!) 57  65 71  Resp: 15  16 17   Temp:  99.4 F (37.4 C)    SpO2: 98%  99% 98%  Weight:      Height:          Constitutional: Lethargic, calm, comfortable Eyes: PERTLA, lids and conjunctivae normal ENMT: Mucous membranes are dry. Posterior pharynx clear of any exudate or lesions.   Neck: normal, supple, no masses, no thyromegaly Respiratory: clear to auscultation bilaterally, no wheezing, no crackles. Normal respiratory effort.   Cardiovascular: S1 & S2 heard, regular rate and rhythm. No extremity edema. No significant JVD. Abdomen: No distension, no tenderness, no masses palpated. Bowel sounds normal.  Musculoskeletal: no clubbing / cyanosis. No joint deformity upper and lower extremities.    Skin: no significant rashes, lesions, ulcers. Warm, dry,  well-perfused. Neurologic: Mild left facial droop. Pt not following commands. She is moving both upper extremities. Babinski down-going bilaterally.  Psychiatric: Lethargic and non responding verbally.     Labs on Admission: I have personally reviewed following labs and imaging studies  CBC:  Recent Labs Lab 03/23/16 1750 03/23/16 1800  WBC 10.4  --   NEUTROABS 8.6*  --   HGB 14.1 14.3  HCT 42.0 42.0  MCV 93.3  --   PLT 187  --    Basic Metabolic Panel:  Recent Labs Lab 03/23/16 1750 03/23/16 1800  NA 131* 130*  K 5.3* 5.4*  CL 92* 93*  CO2 27  --   GLUCOSE 186* 187*  BUN 38* 56*  CREATININE 1.70* 1.80*  CALCIUM 10.8*  --    GFR: Estimated Creatinine Clearance: 20.1 mL/min (by C-G formula based on SCr of 1.8 mg/dL (H)). Liver Function Tests:  Recent Labs Lab 03/23/16 1750  AST 28  ALT 18  ALKPHOS 75  BILITOT 0.8  PROT 7.9  ALBUMIN 3.7   No results for input(s): LIPASE, AMYLASE in the last 168 hours. No results for input(s): AMMONIA in the last 168 hours. Coagulation Profile:  Recent Labs Lab 03/23/16 1750  INR 0.95   Cardiac Enzymes: No results for input(s): CKTOTAL, CKMB, CKMBINDEX, TROPONINI in the last 168 hours. BNP (last 3 results) No results for input(s): PROBNP in the last 8760 hours. HbA1C: No results for input(s): HGBA1C in the last 72 hours. CBG:  Recent Labs Lab 03/23/16 1750  GLUCAP 189*   Lipid Profile: No results for input(s): CHOL, HDL, LDLCALC, TRIG, CHOLHDL, LDLDIRECT in the last 72 hours. Thyroid Function Tests: No results for input(s): TSH, T4TOTAL, FREET4, T3FREE, THYROIDAB in the last 72 hours. Anemia Panel: No results for input(s): VITAMINB12, FOLATE, FERRITIN, TIBC, IRON, RETICCTPCT in the last 72 hours. Urine analysis:    Component Value Date/Time   COLORURINE YELLOW 03/23/2016 1931   APPEARANCEUR HAZY (A) 03/23/2016 1931   LABSPEC 1.014 03/23/2016 1931   PHURINE 6.0 03/23/2016 1931   GLUCOSEU NEGATIVE  03/23/2016 1931   HGBUR NEGATIVE 03/23/2016 1931   HGBUR negative 09/06/2009 1459   BILIRUBINUR NEGATIVE 03/23/2016 1931   BILIRUBINUR NEG 08/23/2010 [place] 03/23/2016 1931   PROTEINUR NEGATIVE 03/23/2016 1931   UROBILINOGEN 0.2 08/23/2010 1440   UROBILINOGEN 0.2 09/06/2009 1459   NITRITE NEGATIVE 03/23/2016  [place] (A) 03/23/2016 1931   Sepsis Labs: @LABRCNTIP (procalcitonin:4,lacticidven:4) )No results found for this or any previous visit (from the past 240 hour(s)).   Radiological Exams on Admission: Dg Chest 2 View  Result Date: 03/23/2016 CLINICAL DATA:  Evaluate for pneumonia EXAM: CHEST  2 VIEW COMPARISON:  11/26/2015 FINDINGS: Normal mediastinum and cardiac silhouette. Normal pulmonary vasculature. No evidence of effusion, infiltrate, or pneumothorax. No acute bony abnormality. IMPRESSION: No acute cardiopulmonary process. Electronically Signed   By: Suzy Bouchard M.D.   On: 03/23/2016 19:16   Ct Head Code Stroke W/o Cm  Result Date: 03/23/2016 CLINICAL DATA:  Code stroke. Right-sided weakness. Last seen normal 3 hours ago. EXAM: CT HEAD WITHOUT CONTRAST TECHNIQUE: Contiguous axial images were obtained from the base of the skull through the vertex without intravenous contrast. COMPARISON:  12/21/2015 and multiple previous.  MRI 09/21/2015. FINDINGS: Brain: There is old infarction in the right parietal cortical and subcortical brain. No sign of acute infarction, mass lesion, hemorrhage, hydrocephalus or extra-axial collection. Mild small vessel changes of the white matter. Vascular: There is atherosclerotic calcification of the major vessels at the base of the brain. Skull: Negative Sinuses/Orbits: Sinuses are clear. Small mastoid effusions. Orbits negative. Other: None ASPECTS ([employer] [facility] Stroke Program Early CT Score) - Ganglionic level infarction (caudate, lentiform nuclei, internal capsule, insula, M1-M3 cortex): 6 - Supraganglionic infarction  (M4-M6 cortex): 3 Total score (0-10 with 10 being normal): 9 IMPRESSION: 1. No acute infarction suspected by CT. Old infarction in the right posterior parietal cortical and subcortical brain. 2. ASPECTS is 9, though the abnormality in M 6 is likely chronic. These results were paged by telephone at the time of interpretation on 03/23/2016 at 6:04 pm to Dr. Cheral Marker, awaiting response. Electronically Signed   By: Nelson Chimes M.D.   On: 03/23/2016 18:06    EKG: Independently reviewed. Sinus rhythm, PAC  Assessment/Plan  1. Acute left-sided weakness; acute encephalopathy  - Pt was reportedly confused the day prior to presentation, then noted to have left facial droop and left-sided weakness ~3 pm on 03/23/16  - Head CT negative for acute intracranial abnormality, but notable for remote infarct in right parietal lobe  - Neurology consulting and much appreciated  - tPA not given as time of onset unclear and infectious or toxic-metabolic etiology appeared more likely  - MRI is motion-degraded, but notable for multiple acute vs subacute infarcts in right MCA territory suggestive of embolic etiology  - TTE, carotid dopplers pending, PT/OT/SLP evals requested  - Continue prophylactic Aggrenox, frequent neuro checks    2. UTI  - There is no fever or leukocytosis, pt unable to confirm or deny sxs, and UA suggests possible infection - Given the unclear etiology for the patient's presenting complaints, she started on Rocephin empirically in ED  - Urine culture is incubating; will continue with empiric cefepime for now    3. Acute kidney injury; hyperkalemia; hyponatremia - SCr is 1.70 on admission, up from apparent baseline of 1; there is associated mild hyperkalemia to 5.3 and hyponatremia to 131  - Suspect the AKI to be prerenal given her recent illnesses, confusion with poor oral intake  - Anticipate improvement in renal fxn and electrolytes with IVF hydration  - Plan to continue NS infusion, hold  benazepril and HCTZ, and repeat chem panel in am   4. Hypertension  - Lisinopril and HCTZ held on admission in light of AKI and hyponatremia - Lopressor held on admission given acute phase ischemic  CVA  - Monitor, treat SBP >220 or DBP >110 for now, resume home antihypertensives as appropriate  5. Ulcerative colitis - Stable per recent PCP notes  - Resume mesalamine once appropriate for a diet   6. Polymyalgia rheumatica  - Managed with prednisone 10 mg qD, held on admission as she is not appropriate for a diet  - There is hyperkalemia, hyponatremia, and hypercalcemia on admission; this could likely be explained by dehydration with AKI, but difficult to exclude adrenal insufficiency  - Will treat with IV hydrocortisone for now   7. Right radius fracture  - Immobilized in cast; neurovascularly intact distally  - PT/OT evals requested as above    DVT prophylaxis: sq heparin Code Status: DNR Family Communication: Discussed with patient Disposition Plan: Observe on telemetry Consults called: Neurology Admission status: Observation    Vianne Bulls, MD [facility] Pager [phone]  If 7PM-7AM, please contact night-coverage [email or web address] Password [email or web address]  03/23/2016, 10:07 PM

## 2016-03-23 NOTE — ED Notes (Signed)
Pt. Returned to the B 14 , Dr. Dolly Rias at the bedside.    Code Stroke was canceled.

## 2016-03-23 NOTE — Progress Notes (Signed)
PATIENT LEFT CT DEPARTMENT AFTER SCAN @1814 .

## 2016-03-23 NOTE — ED Notes (Signed)
Dr. Asencion Noble , neurologist arrived in CT to examine the pt.  The pt. Was alert and answering a  Few questions.   Dr. Asencion Noble requested to call pt.s family.  This RN called pt.s Brother Mr. Bleecker  He arrived to the ED and Dr. Asencion Noble requested the family member to be brought to CT scan.

## 2016-03-23 NOTE — ED Triage Notes (Signed)
Pt. Arrived from [facility] as a code stroke.  Upon arrival paramedics reports that the staff at the facility reported that when pt. Woke up she had AMS and lethargic.  At 15:00 the staff reported that the pt. Became to gaze to the rt. And not moving her  Lt. Side.  The staff reports that the pt. Is usually alert and oriented and able to move her wheel chair around.    Pt arrived blood drawn,  cbg completed.  Pt was seen by Dr. Rosey Bath and airway patent.  Pt. Transferred to Ct.

## 2016-03-23 NOTE — Progress Notes (Signed)
Code stroke called at 1732, patient arrived to [facility] ED via [facility] at 1745.  As per EMS patient was noted to be alterted this am at 0800 and then became worse and developed left side weakness, gaze and droop.  LSN unknown, patients brother at bedside and states she was confused yesterday afternoon.  NIHSS 11.  Cancelled at 1815

## 2016-03-24 ENCOUNTER — Inpatient Hospital Stay (HOSPITAL_COMMUNITY): Payer: Medicare Other

## 2016-03-24 DIAGNOSIS — K5190 Ulcerative colitis, unspecified, without complications: Secondary | ICD-10-CM | POA: Diagnosis present

## 2016-03-24 DIAGNOSIS — M6289 Other specified disorders of muscle: Secondary | ICD-10-CM | POA: Diagnosis not present

## 2016-03-24 DIAGNOSIS — Z7901 Long term (current) use of anticoagulants: Secondary | ICD-10-CM | POA: Diagnosis not present

## 2016-03-24 DIAGNOSIS — Z66 Do not resuscitate: Secondary | ICD-10-CM | POA: Diagnosis present

## 2016-03-24 DIAGNOSIS — K219 Gastro-esophageal reflux disease without esophagitis: Secondary | ICD-10-CM | POA: Diagnosis present

## 2016-03-24 DIAGNOSIS — R29810 Facial weakness: Secondary | ICD-10-CM | POA: Diagnosis present

## 2016-03-24 DIAGNOSIS — I639 Cerebral infarction, unspecified: Secondary | ICD-10-CM | POA: Diagnosis not present

## 2016-03-24 DIAGNOSIS — E860 Dehydration: Secondary | ICD-10-CM | POA: Diagnosis present

## 2016-03-24 DIAGNOSIS — I480 Paroxysmal atrial fibrillation: Secondary | ICD-10-CM | POA: Diagnosis present

## 2016-03-24 DIAGNOSIS — Z7982 Long term (current) use of aspirin: Secondary | ICD-10-CM | POA: Diagnosis not present

## 2016-03-24 DIAGNOSIS — R531 Weakness: Secondary | ICD-10-CM | POA: Diagnosis not present

## 2016-03-24 DIAGNOSIS — Z79899 Other long term (current) drug therapy: Secondary | ICD-10-CM | POA: Diagnosis not present

## 2016-03-24 DIAGNOSIS — N179 Acute kidney failure, unspecified: Secondary | ICD-10-CM | POA: Diagnosis present

## 2016-03-24 DIAGNOSIS — S52501A Unspecified fracture of the lower end of right radius, initial encounter for closed fracture: Secondary | ICD-10-CM | POA: Diagnosis present

## 2016-03-24 DIAGNOSIS — M8580 Other specified disorders of bone density and structure, unspecified site: Secondary | ICD-10-CM | POA: Diagnosis present

## 2016-03-24 DIAGNOSIS — I63511 Cerebral infarction due to unspecified occlusion or stenosis of right middle cerebral artery: Secondary | ICD-10-CM

## 2016-03-24 DIAGNOSIS — M353 Polymyalgia rheumatica: Secondary | ICD-10-CM | POA: Diagnosis present

## 2016-03-24 DIAGNOSIS — E871 Hypo-osmolality and hyponatremia: Secondary | ICD-10-CM | POA: Diagnosis present

## 2016-03-24 DIAGNOSIS — Z8673 Personal history of transient ischemic attack (TIA), and cerebral infarction without residual deficits: Secondary | ICD-10-CM | POA: Diagnosis not present

## 2016-03-24 DIAGNOSIS — I638 Other cerebral infarction: Secondary | ICD-10-CM | POA: Diagnosis not present

## 2016-03-24 DIAGNOSIS — B952 Enterococcus as the cause of diseases classified elsewhere: Secondary | ICD-10-CM | POA: Diagnosis present

## 2016-03-24 DIAGNOSIS — X58XXXA Exposure to other specified factors, initial encounter: Secondary | ICD-10-CM | POA: Diagnosis present

## 2016-03-24 DIAGNOSIS — R930 Abnormal findings on diagnostic imaging of skull and head, not elsewhere classified: Secondary | ICD-10-CM | POA: Diagnosis not present

## 2016-03-24 DIAGNOSIS — I517 Cardiomegaly: Secondary | ICD-10-CM | POA: Diagnosis not present

## 2016-03-24 DIAGNOSIS — R410 Disorientation, unspecified: Secondary | ICD-10-CM

## 2016-03-24 DIAGNOSIS — N390 Urinary tract infection, site not specified: Secondary | ICD-10-CM | POA: Diagnosis present

## 2016-03-24 DIAGNOSIS — I6601 Occlusion and stenosis of right middle cerebral artery: Secondary | ICD-10-CM | POA: Diagnosis not present

## 2016-03-24 DIAGNOSIS — G9340 Encephalopathy, unspecified: Secondary | ICD-10-CM

## 2016-03-24 DIAGNOSIS — I63411 Cerebral infarction due to embolism of right middle cerebral artery: Secondary | ICD-10-CM | POA: Diagnosis present

## 2016-03-24 DIAGNOSIS — I5042 Chronic combined systolic (congestive) and diastolic (congestive) heart failure: Secondary | ICD-10-CM | POA: Diagnosis not present

## 2016-03-24 DIAGNOSIS — E785 Hyperlipidemia, unspecified: Secondary | ICD-10-CM | POA: Diagnosis present

## 2016-03-24 DIAGNOSIS — E875 Hyperkalemia: Secondary | ICD-10-CM | POA: Diagnosis present

## 2016-03-24 DIAGNOSIS — I6340 Cerebral infarction due to embolism of unspecified cerebral artery: Secondary | ICD-10-CM | POA: Diagnosis present

## 2016-03-24 DIAGNOSIS — I10 Essential (primary) hypertension: Secondary | ICD-10-CM | POA: Diagnosis present

## 2016-03-24 DIAGNOSIS — Z7952 Long term (current) use of systemic steroids: Secondary | ICD-10-CM | POA: Diagnosis not present

## 2016-03-24 DIAGNOSIS — G8194 Hemiplegia, unspecified affecting left nondominant side: Secondary | ICD-10-CM | POA: Diagnosis present

## 2016-03-24 LAB — CBC
HCT: 38.4 % (ref 36.0–46.0)
HEMOGLOBIN: 12.8 g/dL (ref 12.0–15.0)
MCH: 31.1 pg (ref 26.0–34.0)
MCHC: 33.3 g/dL (ref 30.0–36.0)
MCV: 93.2 fL (ref 78.0–100.0)
Platelets: 238 10*3/uL (ref 150–400)
RBC: 4.12 MIL/uL (ref 3.87–5.11)
RDW: 12.8 % (ref 11.5–15.5)
WBC: 11.1 10*3/uL — AB (ref 4.0–10.5)

## 2016-03-24 LAB — CREATININE, URINE, RANDOM: CREATININE, URINE: 62.25 mg/dL

## 2016-03-24 LAB — BASIC METABOLIC PANEL
ANION GAP: 13 (ref 5–15)
BUN: 30 mg/dL — ABNORMAL HIGH (ref 6–20)
CALCIUM: 9 mg/dL (ref 8.9–10.3)
CHLORIDE: 96 mmol/L — AB (ref 101–111)
CO2: 24 mmol/L (ref 22–32)
CREATININE: 1.58 mg/dL — AB (ref 0.44–1.00)
GFR calc non Af Amer: 31 mL/min — ABNORMAL LOW (ref >60)
GFR, EST AFRICAN AMERICAN: 36 mL/min — AB (ref >60)
Glucose, Bld: 230 mg/dL — ABNORMAL HIGH (ref 65–99)
Potassium: 3.8 mmol/L (ref 3.5–5.1)
SODIUM: 133 mmol/L — AB (ref 135–145)

## 2016-03-24 LAB — LIPID PANEL
CHOL/HDL RATIO: 2.7 ratio
CHOLESTEROL: 175 mg/dL (ref 0–200)
HDL: 66 mg/dL (ref >40)
LDL Cholesterol: 78 mg/dL (ref 0–99)
Triglycerides: 154 mg/dL — ABNORMAL HIGH (ref <150)
VLDL: 31 mg/dL (ref 0–40)

## 2016-03-24 LAB — MRSA PCR SCREENING: MRSA by PCR: NEGATIVE

## 2016-03-24 LAB — AMMONIA: Ammonia: 42 umol/L — ABNORMAL HIGH (ref 9–35)

## 2016-03-24 LAB — SODIUM, URINE, RANDOM: Sodium, Ur: 107 mmol/L

## 2016-03-24 LAB — ECHOCARDIOGRAM COMPLETE
HEIGHTINCHES: 61 in
WEIGHTICAEL: 1889.6 [oz_av]

## 2016-03-24 LAB — HIV ANTIBODY (ROUTINE TESTING W REFLEX): HIV Screen 4th Generation wRfx: NONREACTIVE

## 2016-03-24 LAB — TSH: TSH: 1.573 u[IU]/mL (ref 0.350–4.500)

## 2016-03-24 LAB — POTASSIUM: Potassium: 4.4 mmol/L (ref 3.5–5.1)

## 2016-03-24 LAB — VITAMIN B12: Vitamin B-12: 651 pg/mL (ref 180–914)

## 2016-03-24 MED ORDER — HYDROCORTISONE NA SUCCINATE PF 100 MG IJ SOLR
50.0000 mg | Freq: Three times a day (TID) | INTRAMUSCULAR | Status: DC
  Administered 2016-03-24 – 2016-03-26 (×6): 50 mg via INTRAVENOUS
  Filled 2016-03-24 (×5): qty 2

## 2016-03-24 MED ORDER — HYDROCORTISONE NA SUCCINATE PF 100 MG IJ SOLR
100.0000 mg | Freq: Once | INTRAMUSCULAR | Status: AC
  Administered 2016-03-24: 100 mg via INTRAVENOUS
  Filled 2016-03-24: qty 2

## 2016-03-24 MED ORDER — ASPIRIN EC 325 MG PO TBEC
325.0000 mg | DELAYED_RELEASE_TABLET | Freq: Every day | ORAL | Status: DC
  Filled 2016-03-24: qty 1

## 2016-03-24 MED ORDER — CLOPIDOGREL BISULFATE 75 MG PO TABS
75.0000 mg | ORAL_TABLET | Freq: Every day | ORAL | Status: DC
  Filled 2016-03-24: qty 1

## 2016-03-24 MED ORDER — FAMOTIDINE IN NACL 20-0.9 MG/50ML-% IV SOLN
20.0000 mg | INTRAVENOUS | Status: DC
  Administered 2016-03-25: 20 mg via INTRAVENOUS
  Filled 2016-03-24 (×2): qty 50

## 2016-03-24 NOTE — Progress Notes (Signed)
*  PRELIMINARY RESULTS* Echocardiogram 2D Echocardiogram has been performed.  Leavy Cella 03/24/2016, 12:09 PM

## 2016-03-24 NOTE — Evaluation (Signed)
Occupational Therapy Evaluation Patient Details Name: Tammy Beard MRN: [id number] DOB: 07/08/1940 Today's Date: 03/24/2016    History of Present Illness Tammy Beard is a 76 y.o. female with medical history significant for history of CVA, ulcerative colitis, polymyalgia rheumatica, hypertension, and GERD who presents to the emergency department from her nursing home for evaluation of altered mental status and acute left-sided weakness. Patient has had a series of recent illnesses, including UTI treated at her nursing facility, and fracture of the right radius. MRI revealed acute R MCA CVA.   Clinical Impression   Since forearm fx; pt has required assist from staff at facility with all ADL and transfers. Currently pt requires mod assist +2 for basic transfers and max assist for ADL. Pt presenting with generalized weakness, poor balance, impaired cognition impacting her independence and safety with ADL and functional mobility. Recommending return to SNF for continued rehab upon d/c. Pt would benefit from continued skilled OT to address established goals.    Follow Up Recommendations  SNF;Supervision/Assistance - 24 hour    Equipment Recommendations  None recommended by OT    Recommendations for Other Services       Precautions / Restrictions Precautions Precautions: Fall Restrictions Weight Bearing Restrictions: Yes RUE Weight Bearing: Non weight bearing      Mobility Bed Mobility Overal bed mobility: Needs Assistance Bed Mobility: Supine to Sit     Supine to sit: Mod assist;+2 for physical assistance     General bed mobility comments: mod A to LE's and trunk for rise to sitting. Could not scoot out to EOB to position self. Insecure in sitting EOB.   Transfers Overall transfer level: Needs assistance Equipment used: None Transfers: Sit to/from [facility] Sit to Stand: Mod assist;+2 physical assistance Stand pivot transfers: Mod assist;+2  physical assistance       General transfer comment: pt stands with arm in arm assist from bed and from [facility]. Has difficulty stepping feet to pivot, especially the left foot. Performed transfer 3x.     Balance Overall balance assessment: Needs assistance Sitting-balance support: Feet supported Sitting balance-Leahy Scale: Poor Sitting balance - Comments: maintains balance with LUE    Standing balance support: Bilateral upper extremity supported Standing balance-Leahy Scale: Zero Standing balance comment: pt requires mod A +2 for standing and is very fearful of falling and thus very shaky while up                            ADL Overall ADL's : Needs assistance/impaired Eating/Feeding: Maximal assistance;Sitting   Grooming: Maximal assistance;Sitting   Upper Body Bathing: Maximal assistance;Sitting   Lower Body Bathing: Maximal assistance;Sit to/from stand;+2 for physical assistance   Upper Body Dressing : Moderate assistance;Sitting   Lower Body Dressing: Maximal assistance;Sit to/from stand;+2 for physical assistance   Toilet Transfer: Moderate assistance;+2 for physical assistance;Stand-pivot;BSC   Toileting- Clothing Manipulation and Hygiene: Total assistance;+2 for physical assistance;Sit to/from stand       Functional mobility during ADLs: Moderate assistance;Rolling walker;+2 for physical assistance       Vision Vision Assessment?: No apparent visual deficits   Perception     Praxis      Pertinent Vitals/Pain Pain Assessment: Faces Faces Pain Scale: Hurts little more Pain Location: RUE with transferring. Pt forgets to not use RUE and grabs things with it Pain Descriptors / Indicators: Guarding;Discomfort Pain Intervention(s): Limited activity within patient's tolerance;Monitored during session;Repositioned     Hand  Dominance Left   Extremity/Trunk Assessment Upper Extremity Assessment Upper Extremity Assessment: Generalized weakness;RUE  deficits/detail RUE Deficits / Details: R forearm fx. Casted and NWB. RUE: Unable to fully assess due to immobilization   Lower Extremity Assessment Lower Extremity Assessment: Defer to PT evaluation LLE Deficits / Details: pt generally weak, no significant strength difference noted R vs L today with testing, however, in standing, pt had more difficulty stepping left foot then right   Cervical / Trunk Assessment Cervical / Trunk Assessment: Kyphotic   Communication Communication Communication: HOH   Cognition Arousal/Alertness: Awake/alert Behavior During Therapy: WFL for tasks assessed/performed Overall Cognitive Status: Impaired/Different from baseline Area of Impairment: Memory;Problem solving     Memory: Decreased short-term memory       Problem Solving: Slow processing;Decreased initiation;Difficulty sequencing;Requires verbal cues General Comments: generally slow to process. Noted apraxia with functional mobility   General Comments       Exercises       Shoulder Instructions      Home Living Family/patient expects to be discharged to:: Skilled nursing facility                                 Additional Comments: from [facility], [place] section, and plan to return      Prior Functioning/Environment Level of Independence: Needs assistance  Gait / Transfers Assistance Needed: was able to ambulate with RW before radius fx. Has been transferring to w/c with asisst since then, past couple of weeks.  ADL's / Homemaking Assistance Needed: needs assistance for bathing and dressing Communication / Swallowing Assistance Needed: was self feeding PTA but sister reports that she has been feeding her while in hospital because has not been able to feed self          OT Problem List: Decreased strength;Decreased range of motion;Decreased activity tolerance;Impaired balance (sitting and/or standing);Decreased coordination;Decreased cognition;Decreased safety  awareness;Decreased knowledge of use of DME or AE;Decreased knowledge of precautions;Impaired UE functional use;Pain   OT Treatment/Interventions: Self-care/ADL training;Therapeutic exercise;Energy conservation;DME and/or AE instruction;Therapeutic activities;Patient/family education;Balance training    OT Goals(Current goals can be found in the care plan section) Acute Rehab OT Goals Patient Stated Goal: return to [facility]. Be able to walk OT Goal Formulation: With patient/family Time For Goal Achievement: 04/07/16 Potential to Achieve Goals: Fair ADL Goals Pt Will Perform Eating: with set-up;with adaptive utensils;sitting Pt Will Perform Grooming: with min assist;sitting Pt Will Transfer to Toilet: with min assist;ambulating;bedside commode  OT Frequency: Min 2X/week   Barriers to D/C:            Co-evaluation PT/OT/SLP Co-Evaluation/Treatment: Yes Reason for Co-Treatment: Complexity of the patient's impairments (multi-system involvement);For patient/therapist safety PT goals addressed during session: Mobility/safety with mobility;Balance OT goals addressed during session: ADL's and self-care      End of Session Equipment Utilized During Treatment: Gait belt Nurse Communication: Mobility status  Activity Tolerance: Patient tolerated treatment well Patient left: in chair;with call bell/phone within reach;with chair alarm set;with family/visitor present   Time: 1459-1520 OT Time Calculation (min): 21 min Charges:  OT General Charges $OT Visit: 1 Procedure OT Evaluation $OT Eval Moderate Complexity: 1 Procedure G-Codes:     Binnie Kand M.S., OTR/L Pager: [phone]  03/24/2016, 4:53 PM

## 2016-03-24 NOTE — Progress Notes (Signed)
Arrived from Ed. Alert and disoriented. Unable to state name. Pt speaks when wanting to. Responds to painful stimulus. Nodded "no" when asked if in pain. Bed in lowest position .

## 2016-03-24 NOTE — Evaluation (Signed)
Clinical/Bedside Swallow Evaluation Patient Details  Name: GERTURDE BLAKENSHIP MRN: [id number] Date of Birth: 10-13-1940  Today's Date: 03/24/2016 Time: SLP Start Time (ACUTE ONLY): 1225 SLP Stop Time (ACUTE ONLY): 1255 SLP Time Calculation (min) (ACUTE ONLY): 30 min  Past Medical History:  Past Medical History:  Diagnosis Date  . Colitis   . Ecchymosis 11/15/2015  . GERD (gastroesophageal reflux disease)   . Heart murmur   . Hyperlipidemia   . Hypertension   . Osteopenia    s/p 7 yrs Fosamax  . Polio   . Polymyalgia ([place])   . Rheumatic fever   . Sleep disorder   . Stroke ([employer])   . Ulcerative colitis    Past Surgical History:  Past Surgical History:  Procedure Laterality Date  . BREAST LUMPECTOMY  1986   left; benign  . CATARACT EXTRACTION     Left eye   . COLONOSCOPY  2006  . Colonosopy  07/05/13   Dr. Deatra Ina  . DILATION AND CURETTAGE OF UTERUS  12/1999  . MELANOMA EXCISION  2007   left arm  . NECK SURGERY  1986   Benign growth on neck removed  . TONSILLECTOMY     HPI:  76 year old female admitted 03/23/16 with left weakness, AMS, and left facial droop. PMH significant for CVA, ulcerative colitis, polymyalgia rheumatica, HTN, GERD, UTI, mental retardation. MRI reveals acute vs subacute multi-territorial infarcts - RMCA. CXR negative.   Assessment / Plan / Recommendation Clinical Impression  Pt presents with adequate oral motor strength and function. No overt s/s aspiration observed on any consistency tested. Pt's sister was present during this assessment, and reported pt usually tolerates chopped meats, soft foods, and uses adaptive utensils and portion plates due to low vision. Pt noted to be very talkative during exam, requiring cues to limit talking while eating. Voice quality is raspy and occasionally wet sounding, which sister reports is baseline, due to history of Polio. Will begin Dys 2 diet and thin liquids. Recommend crushed meds, 1:1 supervision during meals with  minimized distractions and encouragement to not talk. Precautions posted at [facility] and reviewed with pt/sister. RN aware of recommendations. ST will follow for assessment of diet tolerance and readiness to advance solids.     Aspiration Risk  Mild aspiration risk    Diet Recommendation Dysphagia 2 (Fine chop);Thin liquid   Liquid Administration via: Cup;Straw Medication Administration: Crushed with puree Supervision: Staff to assist with self feeding;Full supervision/cueing for compensatory strategies Compensations: Minimize environmental distractions;Small sips/bites;Slow rate Postural Changes: Seated upright at 90 degrees;Remain upright for at least 30 minutes after po intake    Other  Recommendations Oral Care Recommendations: Oral care QID;Staff/trained caregiver to provide oral care   Follow up Recommendations  24 hour supervision     Frequency and Duration min 1 x/week  2 weeks       Prognosis Prognosis for Safe Diet Advancement: Fair Barriers to Reach Goals: Cognitive deficits      Swallow Study   General Date of Onset: 03/23/16 HPI: 76 year old female admitted 03/23/16 with left weakness, AMS, and left facial droop. PMH significant for CVA, ulcerative colitis, polymyalgia rheumatica, HTN, GERD, UTI, mental retardation. MRI reveals acute vs subacute multi-territorial infarcts - RMCA. CXR negative. Type of Study: Bedside Swallow Evaluation Previous Swallow Assessment: July 2017 - BSE with recommendation for regular/thin Diet Prior to this Study: NPO Temperature Spikes Noted: No Respiratory Status: Room air History of Recent Intubation: No Behavior/Cognition: Alert;Cooperative;Pleasant mood;Requires cueing (very talkative)  Oral Cavity Assessment: Within Functional Limits Oral Care Completed by SLP: No Oral Cavity - Dentition: Adequate natural dentition Vision: Functional for self-feeding Self-Feeding Abilities: Able to feed self;Able to feed self with adaptive  devices;Needs assist (sister reports adapative utensils and portion plates due to low vision) Patient Positioning: Upright in bed (raspy voice is baseline per sister) Baseline Vocal Quality: Normal Volitional Cough: Strong Volitional Swallow: Able to elicit    Oral/Motor/Sensory Function Overall Oral Motor/Sensory Function: Within functional limits   Ice Chips Ice chips: Not tested   Thin Liquid Thin Liquid: Within functional limits Presentation: Straw;Cup    Nectar Thick Nectar Thick Liquid: Within functional limits Presentation: Cup   Honey Thick Honey Thick Liquid: Not tested   Puree Puree: Within functional limits Presentation: Spoon   Solid   GO   Solid: Within functional limits Presentation: [facility] [place], [place], [place]  Shonna Chock 03/24/2016,1:14 PM

## 2016-03-24 NOTE — Progress Notes (Signed)
*  PRELIMINARY RESULTS* Vascular Ultrasound Lower extremity venous duplex has been completed.  Preliminary findings: No evidence of DVT or baker's cyst. Carotid Duplex: Bilateral: No significant (1-39%) ICA stenosis. Antegrade vertebral flow.    Landry Mellow, RDMS, RVT   03/24/2016, 4:38 PM

## 2016-03-24 NOTE — Evaluation (Signed)
Physical Therapy Evaluation Patient Details Name: Tammy Beard MRN: [id number] DOB: November 05, 1940 Today's Date: 03/24/2016   History of Present Illness  Tammy Beard is a 76 y.o. female with medical history significant for history of CVA, ulcerative colitis, polymyalgia rheumatica, hypertension, and GERD who presents to the emergency department from her nursing home for evaluation of altered mental status and acute left-sided weakness. Patient has had a series of recent illnesses, including UTI treated at her nursing facility, and fracture of the right radius. MRI revealed acute R MCA CVA.  Clinical Impression  Pt admitted with above diagnosis. Pt currently with functional limitations due to the deficits listed below (see PT Problem List). Pt with noted generalized weakness and apraxia, LLE>RLE. Required +2 mod A for sit to stand and pivot to and from chair.  Pt will benefit from skilled PT to increase their independence and safety with mobility to allow discharge to the venue listed below.       Follow Up Recommendations SNF;Supervision/Assistance - 24 hour    Equipment Recommendations  None recommended by PT    Recommendations for Other Services       Precautions / Restrictions Precautions Precautions: Fall Restrictions Weight Bearing Restrictions: Yes RUE Weight Bearing: Non weight bearing      Mobility  Bed Mobility Overal bed mobility: Needs Assistance Bed Mobility: Supine to Sit     Supine to sit: Mod assist;+2 for physical assistance     General bed mobility comments: mod A to LE's and trunk for rise to sitting. Could not scoot out to EOB to position self. Insecure in sitting EOB.   Transfers Overall transfer level: Needs assistance Equipment used: None Transfers: Sit to/from [facility] Sit to Stand: Mod assist;+2 physical assistance Stand pivot transfers: Mod assist;+2 physical assistance       General transfer comment: pt stands with arm  in arm assist from bed and from [facility]. Has difficulty stepping feet to pivot, especially the left foot. Performed transfer 3x.   Ambulation/Gait             General Gait Details: unable with safety  Stairs            Wheelchair Mobility    Modified Rankin (Stroke Patients Only) Modified Rankin (Stroke Patients Only) Pre-Morbid Rankin Score: Severe disability Modified Rankin: Severe disability     Balance Overall balance assessment: Needs assistance Sitting-balance support: Feet supported Sitting balance-Leahy Scale: Poor Sitting balance - Comments: maintains balance with LUE    Standing balance support: Bilateral upper extremity supported Standing balance-Leahy Scale: Zero Standing balance comment: pt requires mod A +2 for standing and is very fearful of falling and thus very shaky while up                             Pertinent Vitals/Pain Pain Assessment: Faces Faces Pain Scale: Hurts little more Pain Location: RUE with transferring. Pt forgets to not use RUE and grabs things with it Pain Descriptors / Indicators: Guarding;Discomfort Pain Intervention(s): Limited activity within patient's tolerance;Monitored during session    Home Living Family/patient expects to be discharged to:: Skilled nursing facility                 Additional Comments: from [facility] [place] section, and plan to return    Prior Function Level of Independence: Needs assistance   Gait / Transfers Assistance Needed: was able to ambulate with RW before radius fx.  Has been transferring to w/c with asisst since then, past couple of weeks.   ADL's / Homemaking Assistance Needed: needs assistance for bathing and dressing        Hand Dominance   Dominant Hand: Left    Extremity/Trunk Assessment   Upper Extremity Assessment Upper Extremity Assessment: Defer to OT evaluation    Lower Extremity Assessment Lower Extremity Assessment: Generalized weakness;LLE  deficits/detail LLE Deficits / Details: pt generally weak, no significant strength difference noted R vs L today with testing, however, in standing, pt had more difficulty stepping left foot then right    Cervical / Trunk Assessment Cervical / Trunk Assessment: Kyphotic  Communication   Communication: HOH  Cognition Arousal/Alertness: Awake/alert Behavior During Therapy: WFL for tasks assessed/performed Overall Cognitive Status: Impaired/Different from baseline Area of Impairment: Memory;Problem solving     Memory: Decreased short-term memory       Problem Solving: Slow processing;Decreased initiation;Difficulty sequencing;Requires verbal cues General Comments: generally slow to process. Noted apraxia with functional mobility    General Comments General comments (skin integrity, edema, etc.): sister reports that a PFRW was mentioned after her R wrist fx but she has not received one so has been using w/c. Based on balance deficits, do not expect that she would be able to safely use the PFRW    Exercises     Assessment/Plan    PT Assessment Patient needs continued PT services  PT Problem List Decreased strength;Decreased activity tolerance;Decreased balance;Decreased mobility;Decreased coordination;Decreased cognition;Decreased knowledge of precautions;Pain          PT Treatment Interventions DME instruction;Gait training;Functional mobility training;Therapeutic activities;Therapeutic exercise;Balance training;Cognitive remediation;Patient/family education    PT Goals (Current goals can be found in the Care Plan section)  Acute Rehab PT Goals Patient Stated Goal: return to [facility]. Be able to walk PT Goal Formulation: With patient/family Time For Goal Achievement: 04/07/16 Potential to Achieve Goals: Fair    Frequency Min 3X/week   Barriers to discharge        Co-evaluation PT/OT/SLP Co-Evaluation/Treatment: Yes Reason for Co-Treatment: Complexity of the  patient's impairments (multi-system involvement);For patient/therapist safety PT goals addressed during session: Mobility/safety with mobility;Balance         End of Session Equipment Utilized During Treatment: Gait belt Activity Tolerance: Patient tolerated treatment well Patient left: in chair;with chair alarm set;with call bell/phone within reach;with family/visitor present Nurse Communication: Mobility status         Time: 1450-1533 PT Time Calculation (min) (ACUTE ONLY): 43 min   Charges:   PT Evaluation $PT Eval Moderate Complexity: 1 Procedure PT Treatments $Therapeutic Activity: 8-22 mins   PT G Codes:      Leighton Roach, PT  Acute Rehab Services  [place] 03/24/2016, 4:39 PM

## 2016-03-24 NOTE — Progress Notes (Signed)
STROKE TEAM PROGRESS NOTE   HISTORY OF PRESENT ILLNESS (per record) Tammy Beard is an 76 y.o. female with a PMHx of stroke who presents with AMS, dysphasia and left sided weakness. Symptoms began yesterday 03/22/2016, time unknown (LKW) with AMS that gradually worsened throughout the day and into this morning - awoke still confused and was also lethargic this morning. She acutely worsened at 3:00 PM with SNF staff noting left sided weakness and left facial droop. CBG at the scene per EMS was 222 and BP was 156/70. No seizure like activity per EMS and no hx of seizures. Sustained RUE fx 2 weeks ago and her left foot was run over by a wheelchair within the last few days per her brother, who is present for interview during CT scanning. She is DNR/DNI per report. Brother states that she has had two recent episodes of infection with symptoms resembling her current presentation. She takes Plavix at her facility and is not on a blood thinner. Patient was not administered IV t-PA secondary to infection or toxic/metabolic etiologies being significantly more likely than stroke for her presentation. Risks of tPA significantly outweigh potential benefits. She was admitted for further evaluation and treatment.   SUBJECTIVE (INTERVAL HISTORY) Patient sister Juliann Pulse is at the bedside. I also discussed with patient brother her over the phone. Patient is sitting in chair, mildly sleepy but able to follow all commands. She has right arm in cast due to recent fracture. She is on IV fluids for dehydration and IV antibiotics for UTI treatment. Blood pressure stable. No evidence of hypotension.   OBJECTIVE Temp:  [97.7 F (36.5 C)-99.4 F (37.4 C)] 97.7 F (36.5 C) (01/01 0000) Pulse Rate:  [57-88] 68 (01/01 0600) Cardiac Rhythm: Normal sinus rhythm (01/01 0700) Resp:  [13-18] 18 (01/01 0600) BP: (121-169)/(61-97) 151/68 (01/01 0600) SpO2:  [97 %-99 %] 98 % (01/01 0600) Weight:  [53.6 kg (118 lb 1.6 oz)-54 kg (119  lb)] 53.6 kg (118 lb 1.6 oz) (01/01 0000)  CBC:  Recent Labs Lab 03/23/16 1750 03/23/16 1800  WBC 10.4  --   NEUTROABS 8.6*  --   HGB 14.1 14.3  HCT 42.0 42.0  MCV 93.3  --   PLT 187  --     Basic Metabolic Panel:  Recent Labs Lab 03/23/16 1750 03/23/16 1800 03/24/16 0027  NA 131* 130*  --   K 5.3* 5.4* 4.4  CL 92* 93*  --   CO2 27  --   --   GLUCOSE 186* 187*  --   BUN 38* 56*  --   CREATININE 1.70* 1.80*  --   CALCIUM 10.8*  --   --     Lipid Panel:    Component Value Date/Time   CHOL 175 03/24/2016 0027   TRIG 154 (H) 03/24/2016 0027   TRIG 115 02/05/2006 1016   HDL 66 03/24/2016 0027   CHOLHDL 2.7 03/24/2016 0027   VLDL 31 03/24/2016 0027   LDLCALC 78 03/24/2016 0027   HgbA1c:  Lab Results  Component Value Date   HGBA1C 6.0 08/23/2015   Urine Drug Screen: No results found for: LABOPIA, COCAINSCRNUR, LABBENZ, AMPHETMU, THCU, LABBARB    IMAGING I have personally reviewed the radiological images below and agree with the radiology interpretations.  Dg Chest 2 View 03/23/2016 No acute cardiopulmonary process.   Ct Head Code Stroke W/o Cm 03/23/2016 1. No acute infarction suspected by CT. Old infarction in the right posterior parietal cortical and subcortical brain. 2. ASPECTS  is 9, though the abnormality in M 6 is likely chronic. These results were paged by telephone at the time of interpretation on 03/23/2016 at 6:04 pm to Dr. Cheral Marker, awaiting response.   Mr Brain Wo Contrast 03/23/2016 Moderate to severely motion degraded examination. Acute versus subacute multi territorial infarcts including RIGHT MCA territory, most consistent with embolic phenomena.   Mr Jodene Nam Head Wo Contrast 03/24/2016 Negative for emergent large vessel occlusion. Widespread anterior circulation atherosclerosis, with relative sparing of the posterior circulation. No significant change suspected in severe anterior circulation stenoses since [facility] on 09/24/2015 (please see also  that report): Severe stenoses of the left ICA supraclinoid segment and right MCA distal M1 segment.   2D Echocardiogram  Left ventricle: The cavity size was normal. Wall thickness was   increased in a pattern of moderate to severe LVH. There was mild   focal basal and mild asymmetric hypertrophy of the septum.   Systolic function was normal. The estimated ejection fraction was   in the range of 55% to 60%. There was dynamic obstruction at   rest, with a peak velocity of 120 cm/sec and a peak gradient of 6   mm Hg. Wall motion was normal; there were no regional wall motion   abnormalities. - Aortic valve: Moderately calcified annulus. Trileaflet; normal   thickness, mildly calcified leaflets. Valve area (VTI): 2.6 cm^2.   Valve area (Vmax): 2.47 cm^2. Valve area (Vmean): 2.47 cm^2. - Mitral valve: Moderately calcified annulus. There was systolic   anterior motion of the chordal structures.  Carotid Doppler   No significant (1-39%) ICA stenosis. Antegrade vertebral flow.  LE Venous Dopplers No evidence of DVT or baker's cyst. Carotid Duplex: Bilateral:     TTE  Left ventricle: The cavity size was normal. Wall thickness was   increased in a pattern of moderate to severe LVH. There was mild   focal basal and mild asymmetric hypertrophy of the septum.   Systolic function was normal. The estimated ejection fraction was   in the range of 55% to 60%. There was dynamic obstruction at   rest, with a peak velocity of 120 cm/sec and a peak gradient of 6   mm Hg. Wall motion was normal; there were no regional wall motion   abnormalities. - Aortic valve: Moderately calcified annulus. Trileaflet; normal   thickness, mildly calcified leaflets. Valve area (VTI): 2.6 cm^2.   Valve area (Vmax): 2.47 cm^2. Valve area (Vmean): 2.47 cm^2. - Mitral valve: Moderately calcified annulus. There was systolic   anterior motion of the chordal structures.  TEE pending    PHYSICAL EXAM  Temp:  [97.7 F  (36.5 C)-99.4 F (37.4 C)] 97.7 F (36.5 C) (01/01 0000) Pulse Rate:  [57-88] 68 (01/01 0600) Resp:  [13-18] 18 (01/01 0600) BP: (121-169)/(61-97) 151/68 (01/01 0600) SpO2:  [97 %-99 %] 98 % (01/01 0600) Weight:  [118 lb 1.6 oz (53.6 kg)-119 lb (54 kg)] 118 lb 1.6 oz (53.6 kg) (01/01 0000)  General - Well nourished, well developed, in no apparent distress, mildly sleepy.  Ophthalmologic - Fundi not visualized due to noncooperation.  Cardiovascular - Regular rate and rhythm.  Mental Status -  Level of arousal and orientation to place, and person were intact, however not orientated to time and age. Language including expression, naming, repetition, comprehension was assessed and found intact, however, seems to have left UE neglect.  Cranial Nerves II - XII - II - blinking to visual threat in both eyes. III, IV, VI -  Extraocular movements intact. V - Facial sensation intact bilaterally. VII - Facial movement intact bilaterally. VIII - Hearing & vestibular intact bilaterally. X - Palate elevates symmetrically. XI - Chin turning & shoulder shrug intact bilaterally. XII - Tongue protrusion intact.  Motor Strength - The patient's strength was normal in all extremities except LUE 4/5 and neglect of LUE.  Bulk was normal and fasciculations were absent.   Motor Tone - Muscle tone was assessed at the neck and appendages and was normal.  Reflexes - The patient's reflexes were 1+ in all extremities and she had no pathological reflexes.  Sensory - Light touch, temperature/pinprick were assessed and were symmetrical.    Coordination - The patient had normal movements in the right hand with no ataxia or dysmetria. Left UE not tested due to neglect. Tremor was absent.  Gait and Station - deferred.   ASSESSMENT/PLAN Ms. IZUMI CUADRADO is a 76 y.o. female with history of CVA, ulcerative colitis, polymyalgia rheumatica, hypertension, and GERD  Presenting from her SNF with L sided weakness  and altered mental status.    Stroke:  R > L anterior circulation infarcts embolic secondary to unknown source  Resultant  Left UE weakness with neglect  MRI  Acute punctate infarcts R occipital lobe periventricular white matter, RIGHT basal ganglia. Patchy RIGHT frontoparietal lobes infarct, and punctate LEFT parietal lobe infarct  MRA  Anterior circulation atherosclerosis, severe L ICA siphon and R M1 stenoses  Carotid Doppler  unremarkable  2D Echo  EF 55-60%  LE venous dopplers unremarkable  TEE to look for embolic source. Arranged with [place] for tomorrow. (I have made patient NPO after midnight tonight).  If TEE negative, a [place] electrophysiologist will consult and consider placement of an implantable loop recorder to evaluate for atrial fibrillation as etiology of stroke. This has been explained to patient/family by Dr. Erlinda Hong and they are agreeable.  LDL 78  HgbA1c pending  Heparin 5000 units sq tid for VTE prophylaxis  DIET DYS 2 Room service appropriate? Yes with Assist; Fluid consistency: Thin  dipyridamole SR 250 mg/aspirin 25 mg twice a day prior to admission, now on DAPT for stroke prevention.   Patient counseled to be compliant with her antithrombotic medications  Ongoing aggressive stroke risk factor management  Therapy recommendations:  pending   Disposition:  pending   AKI due to dehydration  Cre 1.80, elevated from baseline  On IVF   Close monitoring  UTI  UA WBC 6-30  Afebrile but mild leukocytosis   History of frequent UTI  On cefepime now  Urine culture pending  Hypertension  Stable  Permissive hypertension (OK if < 220/120) but gradually normalize in 5-7 days  Long-term BP goal 130-150 due to severe intracranial stenosis  Avoid hypotension  Hyperlipidemia  Home meds:  lipitor 10, resumed in hospital  LDL 78, goal < 70  Continue statin at discharge  Other Stroke Risk  Factors  Advanced age  ETOH use, advised to drink no more than 1 drink(s) a day  Hx stroke/TIA  11/2015 possible brainstem infarct with inability to follow horizontal or vertical gaze - no new stroke, frequent UTIs felt to be etiology. ED reports read as atrial fibrillation, but 30 d monitor only shows atrial tachycardia  09/2015 R ACA infact d/t intracranial stenosis, cannot rule out embolic source. Put on DAPT x 3 mo -> Aggrenox  Other Active Problems  Ulcerative colitis  PMR, on steroids  R arm  close undisplaced fracture - on cast  Hospital day # 0  Rosalin Hawking, MD PhD Stroke Neurology 03/24/2016 6:08 PM   To contact Stroke Continuity provider, please refer to [email or web address] After hours, contact General Neurology

## 2016-03-24 NOTE — Progress Notes (Signed)
  PT / OT Cancellation Note  Patient Details Name: Tammy Beard MRN: [id number] DOB: 04/06/1940   Cancelled Treatment:    Reason Eval/Treat Not Completed: Patient at procedure or test/unavailable. Will check back as time allows.   [place] 03/24/2016, 10:15 AM

## 2016-03-24 NOTE — Progress Notes (Signed)
PROGRESS NOTE    Tammy Beard  [id number] DOB: 1940/11/30 DOA: 03/23/2016 PCP: Jeanmarie Hubert, MD   Outpatient Specialists:    Brief Narrative:   Tammy Beard is a 76 y.o. female with medical history significant for history of CVA, ulcerative colitis, polymyalgia rheumatica, hypertension, and GERD who presents to the emergency department from her nursing home for evaluation of altered mental status and acute left-sided weakness. Patient has had a series of recent illnesses, including UTI treated at her nursing facility, and fracture of the right radius. She had, by report, otherwise been in her usual state until sometime yesterday afternoon when she was noted to be confused. Confusion was also noted by nursing staff when the patient woke this morning at approximately 8 AM. Later in the day, at approximately 3 PM, the patient was noted to have a left-sided facial droop and was not moving her left side. Patient's brother reports that she has had a very similar presentation in the past with acute illness. There's been no recent head trauma reported, patient has not been noted to have vomiting or diarrhea, and no fevers have been recorded. Patient is unable to provide much history at this time, which is therefore obtained through discussion with the ED personnel, nursing facility personnel, reported the patient's brother, and review of the EMR.  ED Course: Upon arrival to the ED, patient is found to be afebrile, saturating well on room air, and with vital signs otherwise stable. EKG features a sinus rhythm with PACs and chest x-rays negative for acute cardiopulmonary disease. Chemistry panels notable for hyponatremia to 131, mild hyperkalemia with potassium 5.3, BUN of 38, and a serum creatinine of 1.70, up from an apparent baseline of 1. CBC is unremarkable and urinalysis suggests possible infection. INR and troponin are both within the normal limits. Head CT was obtained without contrast and  is negative for acute intracranial abnormality, but notable for old infarction in the posterior right parietal lobe. Neurology was consulted by the ED physician, evaluated the patient in the emergency department, and suspects a toxic metabolic encephalopathy rather than acute CVA, but advises a medical admission for further evaluation and management of this. Patient has remained hemodynamically stable in the ED and in no apparent respiratory distress. She'll be observed on the telemetry unit for ongoing evaluation and management of acute encephalopathy with left facial droop and left-sided weakness, possibly secondary to acute CVA versus UTI/toxic metabolic encephalopathy.    Assessment & Plan:   Principal Problem:   Acute ischemic right MCA stroke (HCC) Active Problems:   Essential hypertension   Ulcerative colitis ([place])   Polymyalgia rheumatica (HCC)   History of CVA (cerebrovascular accident)   Acute kidney injury ([place])   GERD (gastroesophageal reflux disease)   Hyponatremia   UTI (urinary tract infection)   Traumatic closed nondisplaced fracture of distal end of right radius   Acute left-sided weakness   Acute encephalopathy   Hyperkalemia   CVA (cerebral vascular accident) ([place])    Acute left-sided weakness; acute encephalopathy  - Pt was reportedly confused the day prior to presentation, then noted to have left facial droop and left-sided weakness ~3 pm on 03/23/16  - Head CT negative for acute intracranial abnormality, but notable for remote infarct in right parietal lobe  - Neurology consulting and much appreciated  - tPA not given as time of onset unclear and infectious or toxic-metabolic etiology appeared more likely  - MRI is motion-degraded, but notable for multiple acute vs subacute  infarcts in right MCA territory suggestive of embolic etiology  - TTE, carotid dopplers pending, PT/OT/SLP evals requested  - Continue prophylactic Aggrenox, frequent neuro checks     UTI    - There is no fever or leukocytosis, pt unable to confirm or deny sxs, and UA suggests possible infection - Urine culture is incubating; will continue with empiric cefepime for now    Acute kidney injury; hyperkalemia; hyponatremia - SCr is 1.70 on admission, up from apparent baseline of 1; there is associated mild hyperkalemia to 5.3 and hyponatremia to 131  - Suspect the AKI to be prerenal given her recent illnesses, confusion with poor oral intake  - Anticipate improvement in renal fxn and electrolytes with IVF hydration  - Plan to continue NS infusion, hold benazepril and HCTZ -labs ordered   Hypertension  - Lisinopril and HCTZ held on admission in light of AKI and hyponatremia - Lopressor held on admission given acute phase ischemic CVA  - Monitor, treat SBP >220 or DBP >110 for now, resume home antihypertensives as appropriate  Ulcerative colitis - Stable per recent PCP notes  - Resume mesalamine once appropriate for a diet   Polymyalgia rheumatica  - Managed with prednisone 10 mg qD-- ? Adrenal insuff - no labs this AM-- will order - Will treat with IV hydrocortisone for now   Right radius fracture  - Immobilized in cast; neurovascularly intact distally  - PT/OT evals requested as above      Code Status: DNR   Family Communication: Dr. Erlinda Hong to update brother and discuss TEE/loop recorder  Disposition Plan:     Consultants:   neuro    Subjective: Left sided neglect  Objective: Vitals:   03/24/16 0000 03/24/16 0200 03/24/16 0400 03/24/16 0600  BP: (!) 121/97 (!) 142/61 131/63 (!) 151/68  Pulse: 76 70 67 68  Resp: 18 18 18 18   Temp: 97.7 F (36.5 C)     TempSrc: Oral     SpO2: 99% 98% 97% 98%  Weight: 53.6 kg (118 lb 1.6 oz)     Height:        Intake/Output Summary (Last 24 hours) at 03/24/16 1350 Last data filed at 03/23/16 1932  Gross per 24 hour  Intake                0 ml  Output              200 ml  Net             -200 ml   Filed  Weights   03/23/16 1830 03/24/16 0000  Weight: 54 kg (119 lb) 53.6 kg (118 lb 1.6 oz)    Examination:  General exam: chronically ill appearing Respiratory system: Clear to auscultation. Respiratory effort normal. Cardiovascular system: S1 & S2 heard, RRR. No JVD, murmurs, rubs, gallops or clicks. No pedal edema. Gastrointestinal system: Abdomen is nondistended, soft and nontender. No organomegaly or masses felt. Normal bowel sounds heard. Central nervous system: Alert     Data Reviewed: I have personally reviewed following labs and imaging studies  CBC:  Recent Labs Lab 03/23/16 1750 03/23/16 1800  WBC 10.4  --   NEUTROABS 8.6*  --   HGB 14.1 14.3  HCT 42.0 42.0  MCV 93.3  --   PLT 187  --    Basic Metabolic Panel:  Recent Labs Lab 03/23/16 1750 03/23/16 1800 03/24/16 0027  NA 131* 130*  --   K 5.3* 5.4* 4.4  CL 92*  93*  --   CO2 27  --   --   GLUCOSE 186* 187*  --   BUN 38* 56*  --   CREATININE 1.70* 1.80*  --   CALCIUM 10.8*  --   --    GFR: Estimated Creatinine Clearance: 20.1 mL/min (by C-G formula based on SCr of 1.8 mg/dL (H)). Liver Function Tests:  Recent Labs Lab 03/23/16 1750  AST 28  ALT 18  ALKPHOS 75  BILITOT 0.8  PROT 7.9  ALBUMIN 3.7   No results for input(s): LIPASE, AMYLASE in the last 168 hours.  Recent Labs Lab 03/24/16 0027  AMMONIA 42*   Coagulation Profile:  Recent Labs Lab 03/23/16 1750  INR 0.95   Cardiac Enzymes: No results for input(s): CKTOTAL, CKMB, CKMBINDEX, TROPONINI in the last 168 hours. BNP (last 3 results) No results for input(s): PROBNP in the last 8760 hours. HbA1C: No results for input(s): HGBA1C in the last 72 hours. CBG:  Recent Labs Lab 03/23/16 1750  GLUCAP 189*   Lipid Profile:  Recent Labs  03/24/16 0027  CHOL 175  HDL 66  LDLCALC 78  TRIG 154*  CHOLHDL 2.7   Thyroid Function Tests:  Recent Labs  03/24/16 0027  TSH 1.573   Anemia Panel:  Recent Labs  03/24/16 0027    VITAMINB12 651   Urine analysis:    Component Value Date/Time   COLORURINE YELLOW 03/23/2016 1931   APPEARANCEUR HAZY (A) 03/23/2016 1931   LABSPEC 1.014 03/23/2016 1931   PHURINE 6.0 03/23/2016 1931   GLUCOSEU NEGATIVE 03/23/2016 1931   HGBUR NEGATIVE 03/23/2016 1931   HGBUR negative 09/06/2009 1459   BILIRUBINUR NEGATIVE 03/23/2016 1931   BILIRUBINUR NEG 08/23/2010 1440   KETONESUR NEGATIVE 03/23/2016 1931   PROTEINUR NEGATIVE 03/23/2016 1931   UROBILINOGEN 0.2 08/23/2010 1440   UROBILINOGEN 0.2 09/06/2009 1459   NITRITE NEGATIVE 03/23/2016 1931   LEUKOCYTESUR TRACE (A) 03/23/2016 1931     ) Recent Results (from the past 240 hour(s))  MRSA PCR Screening     Status: None   Collection Time: 03/24/16  6:27 AM  Result Value Ref Range Status   MRSA by PCR NEGATIVE NEGATIVE Final    Comment:        The GeneXpert MRSA Assay (FDA approved for NASAL specimens only), is one component of a comprehensive MRSA colonization surveillance program. It is not intended to diagnose MRSA infection nor to guide or monitor treatment for MRSA infections.       Anti-infectives    Start     Dose/Rate Route Frequency Ordered Stop   03/24/16 0000  ceFEPIme (MAXIPIME) 1 g in dextrose 5 % 50 mL IVPB     1 g 100 mL/hr over 30 Minutes Intravenous Every 24 hours 03/23/16 2245     03/23/16 2115  cefTRIAXone (ROCEPHIN) 1 g in dextrose 5 % 50 mL IVPB     1 g 100 mL/hr over 30 Minutes Intravenous  Once 03/23/16 2112 03/23/16 2221       Radiology Studies: Dg Chest 2 View  Result Date: 03/23/2016 CLINICAL DATA:  Evaluate for pneumonia EXAM: CHEST  2 VIEW COMPARISON:  11/26/2015 FINDINGS: Normal mediastinum and cardiac silhouette. Normal pulmonary vasculature. No evidence of effusion, infiltrate, or pneumothorax. No acute bony abnormality. IMPRESSION: No acute cardiopulmonary process. Electronically Signed   By: Suzy Bouchard M.D.   On: 03/23/2016 19:16   Mr Virgel Paling [id number] Contrast  Result  Date: 03/24/2016 CLINICAL DATA:  76 year old female with acute  or subacute right MCA territory infarcts discovered on brain MRI performed for confusion and left side weakness. History of Severe intracranial stenoses. Initial encounter. EXAM: MRA HEAD WITHOUT CONTRAST TECHNIQUE: Angiographic images of the Circle of Willis were obtained using MRA technique without intravenous contrast. COMPARISON:  Brain MRI 03/23/2016. CTA head and neck 09/24/2015. Intracranial MRA 09/22/2015. FINDINGS: The previous MRA was motion degraded. The findings will be compared to the prior CTA. Antegrade flow in the posterior circulation with stable appearing distal vertebral arteries and basilar artery. No vertebrobasilar stenosis. PCA origins are within normal limits. Bilateral PCA branches are within normal limits. Preserved antegrade flow in both ICA siphons. No right siphon stenosis. Severe supraclinoid left ICA siphon stenosis appears stable from the prior CTA (series 406, image 6) and the left ICA terminus remains patent. Mild irregularity and stenosis at the left ACA origin appears stable. MCA and right ACA origins are stable and within normal limits. Visualized bilateral ACA branches appear stable, mild irregularity and stenosis on the left. Visualized left MCA branches appear stable with mild irregularity. The left MCA bifurcation remains patent. Severe distal right M1 stenosis appears stable (series 406, image 9). Right MCA bifurcation remains patent. Distal MCA flow signal appears fairly stable today, although the right MCA branches do appear somewhat more regular. IMPRESSION: Negative for emergent large vessel occlusion. Widespread anterior circulation atherosclerosis, with relative sparing of the posterior circulation. No significant change suspected in severe anterior circulation stenoses since The CTA on 09/24/2015 (please see also that report): Severe stenoses of the left ICA supraclinoid segment and right MCA distal M1  segment. Electronically Signed   By: Genevie Ann M.D.   On: 03/24/2016 10:03   Mr Brain Wo Contrast  Result Date: 03/23/2016 CLINICAL DATA:  Dysphasia, LEFT-sided weakness beginning yesterday, worsening today. Confusion and lethargy. Hypertensive. Recent infection. History of hypertension, hyperlipidemia, polio. EXAM: MRI HEAD WITHOUT CONTRAST TECHNIQUE: Multiplanar, multiecho pulse sequences of the brain and surrounding structures were obtained without intravenous contrast. COMPARISON:  CT HEAD March 23, 2016 at 6 p.m. and MRI of the head September 22, 2015 FINDINGS: Sequences are moderately or severely motion degraded. BRAIN: Subcentimeter reduced diffusion RIGHT occipital lobe periventricular white matter, RIGHT basal ganglia. Patchy reduced diffusion RIGHT frontoparietal lobes, punctate reduced diffusion LEFT parietal lobe. Due to motion, limited assessment on ADC map. No susceptibility artifact to suggest large hemorrhage, limited assessment due to patient motion. Moderate ventriculomegaly on the basis of global parenchymal brain volume loss. Patchy supratentorial white matter FLAIR T2 hyperintensities. No abnormal extra-axial fluid collections. VASCULAR: Major intracranial vascular flow voids present at skull base. SKULL AND UPPER CERVICAL SPINE: Probable partially empty sella. No suspicious calvarial bone marrow signal. Craniocervical junction maintained. SINUSES/ORBITS: Bilateral mastoid effusions. Status post bilateral ocular lens implants. OTHER: None. IMPRESSION: Moderate to severely motion degraded examination. Acute versus subacute multi territorial infarcts including RIGHT MCA territory, most consistent with embolic phenomena. Electronically Signed   By: Elon Alas M.D.   On: 03/23/2016 23:42   Ct Head Code Stroke W/o Cm  Result Date: 03/23/2016 CLINICAL DATA:  Code stroke. Right-sided weakness. Last seen normal 3 hours ago. EXAM: CT HEAD WITHOUT CONTRAST TECHNIQUE: Contiguous axial images  were obtained from the base of the skull through the vertex without intravenous contrast. COMPARISON:  12/21/2015 and multiple previous.  MRI 09/21/2015. FINDINGS: Brain: There is old infarction in the right parietal cortical and subcortical brain. No sign of acute infarction, mass lesion, hemorrhage, hydrocephalus or extra-axial collection. Mild small vessel changes  of the white matter. Vascular: There is atherosclerotic calcification of the major vessels at the base of the brain. Skull: Negative Sinuses/Orbits: Sinuses are clear. Small mastoid effusions. Orbits negative. Other: None ASPECTS ([place] Stroke Program Early CT Score) - Ganglionic level infarction (caudate, lentiform nuclei, internal capsule, insula, M1-M3 cortex): 6 - Supraganglionic infarction (M4-M6 cortex): 3 Total score (0-10 with 10 being normal): 9 IMPRESSION: 1. No acute infarction suspected by CT. Old infarction in the right posterior parietal cortical and subcortical brain. 2. ASPECTS is 9, though the abnormality in M 6 is likely chronic. These results were paged by telephone at the time of interpretation on 03/23/2016 at 6:04 pm to Dr. Cheral Marker, awaiting response. Electronically Signed   By: Nelson Chimes M.D.   On: 03/23/2016 18:06        Scheduled Meds: . atorvastatin  10 mg Oral q1800  . calcium-vitamin D  2 tablet Oral BID WC  . ceFEPime (MAXIPIME) IV  1 g Intravenous Q24H  . diltiazem  60 mg Oral Q12H  . dipyridamole-aspirin  1 capsule Oral BID  . [START ON 03/25/2016] famotidine (PEPCID) IV  20 mg Intravenous Q24H  . heparin  5,000 Units Subcutaneous Q8H  . hydrocortisone sod succinate (SOLU-CORTEF) inj  50 mg Intravenous Q8H  . lactose free nutrition  237 mL Oral QPM  . loratadine  10 mg Oral Daily  . mesalamine  2.4 g Oral Q breakfast  . mirabegron ER  25 mg Oral Daily  . senna-docusate  1 tablet Oral TID   Continuous Infusions:   LOS: 0 days    Time spent: 25 min    [place], DO [facility] Pager [phone]  If 7PM-7AM, please contact night-coverage [email or web address] Password [email or web address] 03/24/2016, 1:50 PM

## 2016-03-25 DIAGNOSIS — N3000 Acute cystitis without hematuria: Secondary | ICD-10-CM

## 2016-03-25 LAB — HEMOGLOBIN A1C
Hgb A1c MFr Bld: 6.3 % — ABNORMAL HIGH (ref 4.8–5.6)
Mean Plasma Glucose: 134 mg/dL

## 2016-03-25 LAB — GLUCOSE, CAPILLARY: Glucose-Capillary: 220 mg/dL — ABNORMAL HIGH (ref 65–99)

## 2016-03-25 LAB — BASIC METABOLIC PANEL
Anion gap: 9 (ref 5–15)
BUN: 26 mg/dL — ABNORMAL HIGH (ref 6–20)
CHLORIDE: 99 mmol/L — AB (ref 101–111)
CO2: 25 mmol/L (ref 22–32)
Calcium: 9.1 mg/dL (ref 8.9–10.3)
Creatinine, Ser: 1.26 mg/dL — ABNORMAL HIGH (ref 0.44–1.00)
GFR calc non Af Amer: 40 mL/min — ABNORMAL LOW (ref >60)
GFR, EST AFRICAN AMERICAN: 47 mL/min — AB (ref >60)
Glucose, Bld: 146 mg/dL — ABNORMAL HIGH (ref 65–99)
POTASSIUM: 4.1 mmol/L (ref 3.5–5.1)
SODIUM: 133 mmol/L — AB (ref 135–145)

## 2016-03-25 LAB — CBC
HEMATOCRIT: 35.6 % — AB (ref 36.0–46.0)
HEMOGLOBIN: 11.7 g/dL — AB (ref 12.0–15.0)
MCH: 30.4 pg (ref 26.0–34.0)
MCHC: 32.9 g/dL (ref 30.0–36.0)
MCV: 92.5 fL (ref 78.0–100.0)
Platelets: 165 10*3/uL (ref 150–400)
RBC: 3.85 MIL/uL — AB (ref 3.87–5.11)
RDW: 12.8 % (ref 11.5–15.5)
WBC: 6 10*3/uL (ref 4.0–10.5)

## 2016-03-25 LAB — VAS US CAROTID
LEFT ECA DIAS: -13 cm/s
LEFT VERTEBRAL DIAS: 15 cm/s
Left CCA dist dias: -13 cm/s
Left CCA dist sys: -68 cm/s
Left CCA prox dias: -2 cm/s
Left CCA prox sys: -238 cm/s
Left ICA dist dias: -13 cm/s
Left ICA dist sys: -51 cm/s
Left ICA prox dias: -13 cm/s
Left ICA prox sys: -49 cm/s
RIGHT ECA DIAS: -12 cm/s
RIGHT VERTEBRAL DIAS: 19 cm/s
Right CCA prox dias: -4 cm/s
Right CCA prox sys: -102 cm/s
Right cca dist sys: -50 cm/s

## 2016-03-25 LAB — UREA NITROGEN, URINE: Urea Nitrogen, Ur: 563 mg/dL

## 2016-03-25 MED ORDER — HYDRALAZINE HCL 20 MG/ML IJ SOLN
5.0000 mg | INTRAMUSCULAR | Status: DC | PRN
  Administered 2016-03-27: 5 mg via INTRAVENOUS
  Filled 2016-03-25: qty 1

## 2016-03-25 MED ORDER — INSULIN ASPART 100 UNIT/ML ~~LOC~~ SOLN
0.0000 [IU] | Freq: Three times a day (TID) | SUBCUTANEOUS | Status: DC
  Administered 2016-03-26: 3 [IU] via SUBCUTANEOUS
  Administered 2016-03-26 – 2016-03-27 (×2): 2 [IU] via SUBCUTANEOUS
  Administered 2016-03-27: 3 [IU] via SUBCUTANEOUS

## 2016-03-25 MED ORDER — INSULIN ASPART 100 UNIT/ML ~~LOC~~ SOLN
0.0000 [IU] | Freq: Every day | SUBCUTANEOUS | Status: DC
  Administered 2016-03-25: 2 [IU] via SUBCUTANEOUS
  Administered 2016-03-26: 3 [IU] via SUBCUTANEOUS

## 2016-03-25 MED ORDER — HYDRALAZINE HCL 20 MG/ML IJ SOLN: 5.0000 mg | INTRAMUSCULAR | Status: DC | PRN

## 2016-03-25 NOTE — Progress Notes (Signed)
STROKE TEAM PROGRESS NOTE   SUBJECTIVE (INTERVAL HISTORY) No family at bedside. Pt is sitting in chair. TEE and loop pending tomorrow. Resume diet today.    OBJECTIVE Temp:  [97.9 F (36.6 C)-98.4 F (36.9 C)] 98.1 F (36.7 C) (01/02 0931) Pulse Rate:  [67-97] 67 (01/02 0931) Cardiac Rhythm: Heart block (01/02 0700) Resp:  [16-18] 16 (01/02 0931) BP: (113-207)/(67-97) 113/93 (01/02 0931) SpO2:  [95 %-100 %] 100 % (01/02 0931)  CBC:  Recent Labs Lab 03/23/16 1750  03/24/16 1448 03/25/16 0508  WBC 10.4  --  11.1* 6.0  NEUTROABS 8.6*  --   --   --   HGB 14.1  < > 12.8 11.7*  HCT 42.0  < > 38.4 35.6*  MCV 93.3  --  93.2 92.5  PLT 187  --  238 165  < > = values in this interval not displayed.  Basic Metabolic Panel:   Recent Labs Lab 03/24/16 1448 03/25/16 0508  NA 133* 133*  K 3.8 4.1  CL 96* 99*  CO2 24 25  GLUCOSE 230* 146*  BUN 30* 26*  CREATININE 1.58* 1.26*  CALCIUM 9.0 9.1    Lipid Panel:     Component Value Date/Time   CHOL 175 03/24/2016 0027   TRIG 154 (H) 03/24/2016 0027   TRIG 115 02/05/2006 1016   HDL 66 03/24/2016 0027   CHOLHDL 2.7 03/24/2016 0027   VLDL 31 03/24/2016 0027   LDLCALC 78 03/24/2016 0027   HgbA1c:  Lab Results  Component Value Date   HGBA1C 6.3 (H) 03/24/2016   Urine Drug Screen: No results found for: LABOPIA, COCAINSCRNUR, LABBENZ, AMPHETMU, THCU, LABBARB    IMAGING I have personally reviewed the radiological images below and agree with the radiology interpretations.  Dg Chest 2 View 03/23/2016 No acute cardiopulmonary process.   Ct Head Code Stroke W/o Cm 03/23/2016 1. No acute infarction suspected by CT. Old infarction in the right posterior parietal cortical and subcortical brain. 2. ASPECTS is 9, though the abnormality in M 6 is likely chronic. These results were paged by telephone at the time of interpretation on 03/23/2016 at 6:04 pm to Dr. Cheral Marker, awaiting response.   Mr Brain Wo  Contrast 03/23/2016 Moderate to severely motion degraded examination. Acute versus subacute multi territorial infarcts including RIGHT MCA territory, most consistent with embolic phenomena.   Mr Jodene Nam Head Wo Contrast 03/24/2016 Negative for emergent large vessel occlusion. Widespread anterior circulation atherosclerosis, with relative sparing of the posterior circulation. No significant change suspected in severe anterior circulation stenoses since The CTA on 09/24/2015 (please see also that report): Severe stenoses of the left ICA supraclinoid segment and right MCA distal M1 segment.   2D Echocardiogram  - Left ventricle: The cavity size was normal. Wall thickness was increased in a pattern of moderate to severe LVH. There was mild focal basal and mild asymmetric hypertrophy of the septum. Systolic function was normal. The estimated ejection fraction was in the range of 55% to 60%. There was dynamic obstruction at rest, with a peak velocity of 120 cm/sec and a peak gradient of 6 mm Hg. Wall motion was normal; there were no regional wall motion abnormalities. - Aortic valve: Moderately calcified annulus. Trileaflet; normal thickness, mildly calcified leaflets. Valve area (VTI): 2.6 cm^2. Valve area (Vmax): 2.47 cm^2. Valve area (Vmean): 2.47 cm^2.  - Mitral valve: Moderately calcified annulus. There was systolic anterior motion of the chordal structures.  Carotid Doppler   No significant (1-39%) ICA stenosis. Antegrade vertebral  flow.  LE Venous Dopplers No evidence of DVT or baker's cyst.  TEE pending  PHYSICAL EXAM  Temp:  [97.9 F (36.6 C)-98.4 F (36.9 C)] 98.1 F (36.7 C) (01/02 0931) Pulse Rate:  [67-97] 67 (01/02 0931) Resp:  [16-18] 16 (01/02 0931) BP: (113-207)/(67-97) 113/93 (01/02 0931) SpO2:  [95 %-100 %] 100 % (01/02 0931)  General - Well nourished, well developed, in no apparent distress, mildly sleepy.  Ophthalmologic - Fundi not visualized due to  noncooperation.  Cardiovascular - Regular rate and rhythm.  Mental Status -  Level of arousal and orientation to place, and person were intact, however not orientated to time and age. Language including expression, naming, repetition, comprehension was assessed and found intact, however, seems to have left UE neglect.  Cranial Nerves II - XII - II - blinking to visual threat in both eyes. III, IV, VI - Extraocular movements intact. V - Facial sensation intact bilaterally. VII - Facial movement intact bilaterally. VIII - Hearing & vestibular intact bilaterally. X - Palate elevates symmetrically. XI - Chin turning & shoulder shrug intact bilaterally. XII - Tongue protrusion intact.  Motor Strength - The patient's strength was normal in all extremities except LUE 4/5 and neglect of LUE.  Bulk was normal and fasciculations were absent.   Motor Tone - Muscle tone was assessed at the neck and appendages and was normal.  Reflexes - The patient's reflexes were 1+ in all extremities and she had no pathological reflexes.  Sensory - Light touch, temperature/pinprick were assessed and were symmetrical.    Coordination - The patient had normal movements in the right hand with no ataxia or dysmetria. Left UE not tested due to neglect. Tremor was absent.  Gait and Station - deferred.   ASSESSMENT/PLAN Tammy Beard is a 76 y.o. female with history of CVA, ulcerative colitis, polymyalgia rheumatica, hypertension, and GERD  Presenting from her SNF with L sided weakness and altered mental status.    Stroke:  R > L anterior circulation infarcts embolic secondary to unknown source  Resultant  Left UE weakness with neglect  MRI  Acute punctate infarcts R occipital lobe periventricular white matter, RIGHT basal ganglia. Patchy RIGHT frontoparietal lobes infarct, and punctate LEFT parietal lobe infarct  MRA  Anterior circulation atherosclerosis, severe L ICA siphon and R M1 stenoses  Carotid  Doppler  unremarkable  2D Echo  EF 55-60%  LE venous dopplers unremarkable  TEE to look for embolic source. Arranged with [place] for Wed (schedule full for Tues). (NPO after midnight tonight).  If TEE negative, a [place] electrophysiologist will consult and consider placement of an implantable loop recorder to evaluate for atrial fibrillation as etiology of stroke. This has been explained to patient/family by Dr. Erlinda Hong and they are agreeable.  LDL 78  HgbA1c 6.3  Heparin 5000 units sq tid for VTE prophylaxis Diet NPO time specified DIET DYS 2 Room service appropriate? Yes; Fluid consistency: Thin  dipyridamole SR 250 mg/aspirin 25 mg twice a day prior to admission, now on DAPT for stroke prevention.   Patient counseled to be compliant with her antithrombotic medications  Ongoing aggressive stroke risk factor management  Therapy recommendations:  SNF  Disposition:  pending (from [place])  AKI due to dehydration  Cre 1.80 -1.58-1.26  On IVF   Close monitoring  UTI  UA WBC 6-30  Afebrile but mild leukocytosis   History of frequent UTI  On cefepime now  Urine culture >100k Enterococcus faecalis   Hypertension  Stable  Permissive hypertension (OK if < 220/120) but gradually normalize in 5-7 days  Long-term BP goal 130-150 due to severe intracranial stenosis  Avoid hypotension  Hyperlipidemia  Home meds:  lipitor 10, resumed in hospital  LDL 78, goal < 70  Continue statin at discharge  Other Stroke Risk Factors  Advanced age  ETOH use, advised to drink no more than 1 drink(s) a day  Hx stroke/TIA  11/2015 possible brainstem infarct with inability to follow horizontal or vertical gaze - no new stroke, frequent UTIs felt to be etiology. ED reports read as atrial fibrillation, but 30 d monitor only shows atrial tachycardia  09/2015 R ACA infact d/t intracranial stenosis, cannot rule out  embolic source. Put on DAPT x 3 mo -> Aggrenox  Other Active Problems  Ulcerative colitis  PMR, on steroids  R arm close undisplaced radius fracture - on cast  Hospital day # 1  Tammy Hawking, MD PhD Stroke Neurology 03/25/2016 5:26 PM   To contact Stroke Continuity provider, please refer to [email or web address] After hours, contact General Neurology

## 2016-03-25 NOTE — Progress Notes (Addendum)
    [facility] has been requested to perform a transesophageal echocardiogram on Tammy Beard for acute CVA.  After careful review of history and examination, the risks and benefits of transesophageal echocardiogram have been explained including risks of esophageal damage, perforation (1:10,000 risk), bleeding, pharyngeal hematoma as well as other potential complications associated with conscious sedation including aspiration, arrhythmia, respiratory failure and death. Alternatives to treatment were discussed with patient and daughter, questions were answered. Patient and daughter are willing to proceed.   Scheduled for 03/26/16 at 10:30 am.  Daune Perch, NP  03/25/2016 5:18 PM

## 2016-03-25 NOTE — Progress Notes (Signed)
Inpatient Diabetes Program Recommendations  AACE/ADA: New Consensus Statement on Inpatient Glycemic Control (2015)  Target Ranges:  Prepandial:   less than 140 mg/dL      Peak postprandial:   less than 180 mg/dL (1-2 hours)      Critically ill patients:  140 - 180 mg/dL  Results for ZHANAE, BARGERON (MRN [id number]) as of 03/25/2016 11:02  Ref. Range 03/24/2016 00:27 03/24/2016 14:48 03/25/2016 05:08  Glucose Latest Ref Range: 65 - 99 mg/dL  230 (H) 146 (H)  Hemoglobin A1C Latest Ref Range: 4.8 - 5.6 % 6.3 (H)      Review of Glycemic Control  Diabetes history: No Outpatient Diabetes medications: NA Current orders for Inpatient glycemic control: None  Inpatient Diabetes Program Recommendations: Correction (SSI): While inpatient and ordered steroids, please consider ordering CBGs with Novolog correction scale.  Thanks, Barnie Alderman, RN, MSN, CDE Diabetes Coordinator Inpatient Diabetes Program [phone] (Team Pager from 8am to 5pm)

## 2016-03-25 NOTE — Care Management Note (Signed)
Case Management Note  Patient Details  Name: Tammy Beard MRN: [id number] Date of Birth: May 19, 1940  Subjective/Objective:   Pt admitted with CVA. She is from [facility] [place].                  Action/Plan: Plan is for pt to have TEE today. PT/OT recommending SNF. CSW aware. CM following for d/c needs.   Expected Discharge Date:                  Expected Discharge Plan:  Skilled Nursing Facility  In-House Referral:  Clinical Social Work  Discharge planning Services     Post Acute Care Choice:    Choice offered to:     DME Arranged:    DME Agency:     HH Arranged:    Divide Agency:     Status of Service:  In process, will continue to follow  If discussed at Long Length of Stay Meetings, dates discussed:    Additional Comments:  Pollie Friar, RN 03/25/2016, 10:48 AM

## 2016-03-25 NOTE — Progress Notes (Signed)
Patient's BP was recorded as 207/73 at 0012.  Rechecked again at 0047 as 190/97.  3rd check at 0143 as 204/67.  Contacted on call [facility] who put in orders for PRN hydralazine for SBP greater than 220 or DBP greater than 120.  RN will continue to monitor.

## 2016-03-25 NOTE — Progress Notes (Signed)
PROGRESS NOTE    Tammy Beard  [id number] DOB: 06-Oct-1940 DOA: 03/23/2016 PCP: Jeanmarie Hubert, MD   Outpatient Specialists:    Brief Narrative:   Tammy Beard is a 76 y.o. female with medical history significant for history of CVA, ulcerative colitis, polymyalgia rheumatica, hypertension, and GERD who presents to the emergency department from her nursing home for evaluation of altered mental status and acute left-sided weakness. Patient has had a series of recent illnesses, including UTI treated at her nursing facility, and fracture of the right radius. She had, by report, otherwise been in her usual state until sometime yesterday afternoon when she was noted to be confused. Confusion was also noted by nursing staff when the patient woke this morning at approximately 8 AM. Later in the day, at approximately 3 PM, the patient was noted to have a left-sided facial droop and was not moving her left side. Patient's brother reports that she has had a very similar presentation in the past with acute illness. There's been no recent head trauma reported, patient has not been noted to have vomiting or diarrhea, and no fevers have been recorded. Patient is unable to provide much history at this time, which is therefore obtained through discussion with the ED personnel, nursing facility personnel, reported the patient's brother, and review of the EMR.  ED Course: Upon arrival to the ED, patient is found to be afebrile, saturating well on room air, and with vital signs otherwise stable. EKG features a sinus rhythm with PACs and chest x-rays negative for acute cardiopulmonary disease. Chemistry panels notable for hyponatremia to 131, mild hyperkalemia with potassium 5.3, BUN of 38, and a serum creatinine of 1.70, up from an apparent baseline of 1. CBC is unremarkable and urinalysis suggests possible infection. INR and troponin are both within the normal limits. Head CT was obtained without contrast and  is negative for acute intracranial abnormality, but notable for old infarction in the posterior right parietal lobe. Neurology was consulted by the ED physician, evaluated the patient in the emergency department, and suspects a toxic metabolic encephalopathy rather than acute CVA, but advises a medical admission for further evaluation and management of this. Patient has remained hemodynamically stable in the ED and in no apparent respiratory distress. She'll be observed on the telemetry unit for ongoing evaluation and management of acute encephalopathy with left facial droop and left-sided weakness, possibly secondary to acute CVA versus UTI/toxic metabolic encephalopathy.    Assessment & Plan:   Principal Problem:   Acute ischemic right MCA stroke (HCC) Active Problems:   Essential hypertension   Ulcerative colitis ([place])   Polymyalgia rheumatica (HCC)   History of CVA (cerebrovascular accident)   Acute kidney injury ([place])   GERD (gastroesophageal reflux disease)   Hyponatremia   UTI (urinary tract infection)   Traumatic closed nondisplaced fracture of distal end of right radius   Acute left-sided weakness   Acute encephalopathy   Hyperkalemia   CVA (cerebral vascular accident) ([place])    Acute left-sided weakness; acute encephalopathy  - Pt was reportedly confused the day prior to presentation, then noted to have left facial droop and left-sided weakness ~3 pm on 03/23/16  - Head CT negative for acute intracranial abnormality, but notable for remote infarct in right parietal lobe  - Neurology consulting and much appreciated  - tPA not given as time of onset unclear and infectious or toxic-metabolic etiology appeared more likely  - MRI is motion-degraded, but notable for multiple acute vs subacute  infarcts in right MCA territory suggestive of embolic etiology  -for TEE and loop recorder   UTI- entercoccus fecalis - There is no fever or leukocytosis, pt unable to confirm or deny sxs,  and UA suggests possible infection - Urine culture is incubating; will continue with empiric cefepime for now   -sent message to Dr. McDiarmid to notify patient's here at brother's request  Acute kidney injury; hyperkalemia; hyponatremia - SCr is 1.70 on admission, up from apparent baseline of 1; there is associated mild hyperkalemia to 5.3 and hyponatremia to 131  - Suspect the AKI to be prerenal given her recent illnesses, confusion with poor oral intake  - Anticipate improvement in renal fxn and electrolytes with IVF hydration  - Plan to continue NS infusion, hold benazepril and HCTZ -labs ordered   Hypertension  - Lisinopril and HCTZ held on admission in light of AKI and hyponatremia - Lopressor held on admission given acute phase ischemic CVA  - Monitor, treat SBP >220 or DBP >110 for now, resume home antihypertensives as appropriate  Ulcerative colitis - Stable per recent PCP notes  - Resume mesalamine once appropriate for a diet   Polymyalgia rheumatica  - Managed with prednisone 10 mg qD-- ? Adrenal insuff - no labs this AM-- will order - Will treat with IV hydrocortisone for now   Right radius fracture  - Immobilized in cast; neurovascularly intact distally  - PT/OT evals requested as above      Code Status: DNR   Family Communication: Called brother update as much as I could  Disposition Plan:     Consultants:   neuro    Subjective: Left sided neglect  Objective: Vitals:   03/25/16 0047 03/25/16 0140 03/25/16 0531 03/25/16 0931  BP: (!) 190/97 (!) 204/67 (!) 182/92 (!) 113/93  Pulse:   68 67  Resp:   18 16  Temp:   97.9 F (36.6 C) 98.1 F (36.7 C)  TempSrc:   Oral Oral  SpO2:   100% 100%  Weight:      Height:        Intake/Output Summary (Last 24 hours) at 03/25/16 1245 Last data filed at 03/25/16 0508  Gross per 24 hour  Intake              340 ml  Output                0 ml  Net              340 ml   Filed Weights   03/23/16  1830 03/24/16 0000  Weight: 54 kg (119 lb) 53.6 kg (118 lb 1.6 oz)    Examination:  General exam: chronically ill appearing Respiratory system: Clear to auscultation. Respiratory effort normal. Cardiovascular system: S1 & S2 heard, RRR. No JVD, murmurs, rubs, gallops or clicks. No pedal edema. Gastrointestinal system: Abdomen is nondistended, soft and nontender. No organomegaly or masses felt. Normal bowel sounds heard. Central nervous system: Alert     Data Reviewed: I have personally reviewed following labs and imaging studies  CBC:  Recent Labs Lab 03/23/16 1750 03/23/16 1800 03/24/16 1448 03/25/16 0508  WBC 10.4  --  11.1* 6.0  NEUTROABS 8.6*  --   --   --   HGB 14.1 14.3 12.8 11.7*  HCT 42.0 42.0 38.4 35.6*  MCV 93.3  --  93.2 92.5  PLT 187  --  238 [id number]   Basic Metabolic Panel:  Recent Labs Lab 03/23/16 1750 03/23/16  1800 03/24/16 0027 03/24/16 1448 03/25/16 0508  NA 131* 130*  --  133* 133*  K 5.3* 5.4* 4.4 3.8 4.1  CL 92* 93*  --  96* 99*  CO2 27  --   --  24 25  GLUCOSE 186* 187*  --  230* 146*  BUN 38* 56*  --  30* 26*  CREATININE 1.70* 1.80*  --  1.58* 1.26*  CALCIUM 10.8*  --   --  9.0 9.1   GFR: Estimated Creatinine Clearance: 28.7 mL/min (by C-G formula based on SCr of 1.26 mg/dL (H)). Liver Function Tests:  Recent Labs Lab 03/23/16 1750  AST 28  ALT 18  ALKPHOS 75  BILITOT 0.8  PROT 7.9  ALBUMIN 3.7   No results for input(s): LIPASE, AMYLASE in the last 168 hours.  Recent Labs Lab 03/24/16 0027  AMMONIA 42*   Coagulation Profile:  Recent Labs Lab 03/23/16 1750  INR 0.95   Cardiac Enzymes: No results for input(s): CKTOTAL, CKMB, CKMBINDEX, TROPONINI in the last 168 hours. BNP (last 3 results) No results for input(s): PROBNP in the last 8760 hours. HbA1C:  Recent Labs  03/24/16 0027  HGBA1C 6.3*   CBG:  Recent Labs Lab 03/23/16 1750  GLUCAP 189*   Lipid Profile:  Recent Labs  03/24/16 0027  CHOL 175  HDL  66  LDLCALC 78  TRIG 154*  CHOLHDL 2.7   Thyroid Function Tests:  Recent Labs  03/24/16 0027  TSH 1.573   Anemia Panel:  Recent Labs  03/24/16 0027  VITAMINB12 651   Urine analysis:    Component Value Date/Time   COLORURINE YELLOW 03/23/2016 1931   APPEARANCEUR HAZY (A) 03/23/2016 1931   LABSPEC 1.014 03/23/2016 1931   PHURINE 6.0 03/23/2016 1931   GLUCOSEU NEGATIVE 03/23/2016 1931   HGBUR NEGATIVE 03/23/2016 1931   HGBUR negative 09/06/2009 1459   BILIRUBINUR NEGATIVE 03/23/2016 1931   BILIRUBINUR NEG 08/23/2010 1440   KETONESUR NEGATIVE 03/23/2016 1931   PROTEINUR NEGATIVE 03/23/2016 1931   UROBILINOGEN 0.2 08/23/2010 1440   UROBILINOGEN 0.2 09/06/2009 1459   NITRITE NEGATIVE 03/23/2016 1931   LEUKOCYTESUR TRACE (A) 03/23/2016 1931     ) Recent Results (from the past 240 hour(s))  Urine culture     Status: Abnormal (Preliminary result)   Collection Time: 03/23/16  7:31 PM  Result Value Ref Range Status   Specimen Description URINE, CATHETERIZED  Final   Special Requests NONE  Final   Culture (A)  Final    >=100,000 COLONIES/mL ENTEROCOCCUS FAECALIS SUSCEPTIBILITIES TO FOLLOW    Report Status PENDING  Incomplete  MRSA PCR Screening     Status: None   Collection Time: 03/24/16  6:27 AM  Result Value Ref Range Status   MRSA by PCR NEGATIVE NEGATIVE Final    Comment:        The GeneXpert MRSA Assay (FDA approved for NASAL specimens only), is one component of a comprehensive MRSA colonization surveillance program. It is not intended to diagnose MRSA infection nor to guide or monitor treatment for MRSA infections.       Anti-infectives    Start     Dose/Rate Route Frequency Ordered Stop   03/24/16 0000  ceFEPIme (MAXIPIME) 1 g in dextrose 5 % 50 mL IVPB     1 g 100 mL/hr over 30 Minutes Intravenous Every 24 hours 03/23/16 2245     03/23/16 2115  cefTRIAXone (ROCEPHIN) 1 g in dextrose 5 % 50 mL IVPB  1 g 100 mL/hr over 30 Minutes Intravenous   Once 03/23/16 2112 03/23/16 2221       Radiology Studies: Dg Chest 2 View  Result Date: 03/23/2016 CLINICAL DATA:  Evaluate for pneumonia EXAM: CHEST  2 VIEW COMPARISON:  11/26/2015 FINDINGS: Normal mediastinum and cardiac silhouette. Normal pulmonary vasculature. No evidence of effusion, infiltrate, or pneumothorax. No acute bony abnormality. IMPRESSION: No acute cardiopulmonary process. Electronically Signed   By: Suzy Bouchard M.D.   On: 03/23/2016 19:16   Mr Virgel Paling [id number] Contrast  Result Date: 03/24/2016 CLINICAL DATA:  76 year old female with acute or subacute right MCA territory infarcts discovered on brain MRI performed for confusion and left side weakness. History of Severe intracranial stenoses. Initial encounter. EXAM: MRA HEAD WITHOUT CONTRAST TECHNIQUE: Angiographic images of the Circle of Willis were obtained using MRA technique without intravenous contrast. COMPARISON:  Brain MRI 03/23/2016. CTA head and neck 09/24/2015. Intracranial MRA 09/22/2015. FINDINGS: The previous MRA was motion degraded. The findings will be compared to the prior CTA. Antegrade flow in the posterior circulation with stable appearing distal vertebral arteries and basilar artery. No vertebrobasilar stenosis. PCA origins are within normal limits. Bilateral PCA branches are within normal limits. Preserved antegrade flow in both ICA siphons. No right siphon stenosis. Severe supraclinoid left ICA siphon stenosis appears stable from the prior CTA (series 406, image 6) and the left ICA terminus remains patent. Mild irregularity and stenosis at the left ACA origin appears stable. MCA and right ACA origins are stable and within normal limits. Visualized bilateral ACA branches appear stable, mild irregularity and stenosis on the left. Visualized left MCA branches appear stable with mild irregularity. The left MCA bifurcation remains patent. Severe distal right M1 stenosis appears stable (series 406, image 9). Right MCA  bifurcation remains patent. Distal MCA flow signal appears fairly stable today, although the right MCA branches do appear somewhat more regular. IMPRESSION: Negative for emergent large vessel occlusion. Widespread anterior circulation atherosclerosis, with relative sparing of the posterior circulation. No significant change suspected in severe anterior circulation stenoses since The CTA on 09/24/2015 (please see also that report): Severe stenoses of the left ICA supraclinoid segment and right MCA distal M1 segment. Electronically Signed   By: Genevie Ann M.D.   On: 03/24/2016 10:03   Mr Brain Wo Contrast  Result Date: 03/23/2016 CLINICAL DATA:  Dysphasia, LEFT-sided weakness beginning yesterday, worsening today. Confusion and lethargy. Hypertensive. Recent infection. History of hypertension, hyperlipidemia, polio. EXAM: MRI HEAD WITHOUT CONTRAST TECHNIQUE: Multiplanar, multiecho pulse sequences of the brain and surrounding structures were obtained without intravenous contrast. COMPARISON:  CT HEAD March 23, 2016 at 6 p.m. and MRI of the head September 22, 2015 FINDINGS: Sequences are moderately or severely motion degraded. BRAIN: Subcentimeter reduced diffusion RIGHT occipital lobe periventricular white matter, RIGHT basal ganglia. Patchy reduced diffusion RIGHT frontoparietal lobes, punctate reduced diffusion LEFT parietal lobe. Due to motion, limited assessment on ADC map. No susceptibility artifact to suggest large hemorrhage, limited assessment due to patient motion. Moderate ventriculomegaly on the basis of global parenchymal brain volume loss. Patchy supratentorial white matter FLAIR T2 hyperintensities. No abnormal extra-axial fluid collections. VASCULAR: Major intracranial vascular flow voids present at skull base. SKULL AND UPPER CERVICAL SPINE: Probable partially empty sella. No suspicious calvarial bone marrow signal. Craniocervical junction maintained. SINUSES/ORBITS: Bilateral mastoid effusions. Status  post bilateral ocular lens implants. OTHER: None. IMPRESSION: Moderate to severely motion degraded examination. Acute versus subacute multi territorial infarcts including RIGHT MCA territory, most consistent with embolic  phenomena. Electronically Signed   By: Elon Alas M.D.   On: 03/23/2016 23:42   Ct Head Code Stroke W/o Cm  Result Date: 03/23/2016 CLINICAL DATA:  Code stroke. Right-sided weakness. Last seen normal 3 hours ago. EXAM: CT HEAD WITHOUT CONTRAST TECHNIQUE: Contiguous axial images were obtained from the base of the skull through the vertex without intravenous contrast. COMPARISON:  12/21/2015 and multiple previous.  MRI 09/21/2015. FINDINGS: Brain: There is old infarction in the right parietal cortical and subcortical brain. No sign of acute infarction, mass lesion, hemorrhage, hydrocephalus or extra-axial collection. Mild small vessel changes of the white matter. Vascular: There is atherosclerotic calcification of the major vessels at the base of the brain. Skull: Negative Sinuses/Orbits: Sinuses are clear. Small mastoid effusions. Orbits negative. Other: None ASPECTS ([place] Stroke Program Early CT Score) - Ganglionic level infarction (caudate, lentiform nuclei, internal capsule, insula, M1-M3 cortex): 6 - Supraganglionic infarction (M4-M6 cortex): 3 Total score (0-10 with 10 being normal): 9 IMPRESSION: 1. No acute infarction suspected by CT. Old infarction in the right posterior parietal cortical and subcortical brain. 2. ASPECTS is 9, though the abnormality in M 6 is likely chronic. These results were paged by telephone at the time of interpretation on 03/23/2016 at 6:04 pm to Dr. Cheral Marker, awaiting response. Electronically Signed   By: Nelson Chimes M.D.   On: 03/23/2016 18:06        Scheduled Meds: . aspirin EC  325 mg Oral Daily  . atorvastatin  10 mg Oral q1800  . calcium-vitamin D  2 tablet Oral BID WC  . ceFEPime (MAXIPIME) IV  1 g Intravenous Q24H  . clopidogrel  75  mg Oral Daily  . diltiazem  60 mg Oral Q12H  . famotidine (PEPCID) IV  20 mg Intravenous Q24H  . heparin  5,000 Units Subcutaneous Q8H  . hydrocortisone sod succinate (SOLU-CORTEF) inj  50 mg Intravenous Q8H  . insulin aspart  0-15 Units Subcutaneous TID WC  . insulin aspart  0-5 Units Subcutaneous QHS  . lactose free nutrition  237 mL Oral QPM  . loratadine  10 mg Oral Daily  . mesalamine  2.4 g Oral Q breakfast  . mirabegron ER  25 mg Oral Daily  . senna-docusate  1 tablet Oral TID   Continuous Infusions:   LOS: 1 day    Time spent: 25 min    Brooks, DO [facility] Pager [phone]  If 7PM-7AM, please contact night-coverage [email or web address] Password [email or web address] 03/25/2016, 12:45 PM

## 2016-03-26 ENCOUNTER — Inpatient Hospital Stay (HOSPITAL_COMMUNITY): Payer: Medicare Other

## 2016-03-26 ENCOUNTER — Encounter (HOSPITAL_COMMUNITY)
Admission: EM | Disposition: A | Discharge status: Skilled Nursing Facility | Payer: Self-pay | Source: Home / Self Care | Attending: Internal Medicine

## 2016-03-26 ENCOUNTER — Encounter (HOSPITAL_COMMUNITY): Payer: Self-pay

## 2016-03-26 DIAGNOSIS — I480 Paroxysmal atrial fibrillation: Secondary | ICD-10-CM

## 2016-03-26 DIAGNOSIS — I5042 Chronic combined systolic (congestive) and diastolic (congestive) heart failure: Secondary | ICD-10-CM

## 2016-03-26 LAB — BASIC METABOLIC PANEL
ANION GAP: 12 (ref 5–15)
BUN: 18 mg/dL (ref 6–20)
CHLORIDE: 94 mmol/L — AB (ref 101–111)
CO2: 27 mmol/L (ref 22–32)
Calcium: 9.4 mg/dL (ref 8.9–10.3)
Creatinine, Ser: 1.11 mg/dL — ABNORMAL HIGH (ref 0.44–1.00)
GFR, EST AFRICAN AMERICAN: 54 mL/min — AB (ref >60)
GFR, EST NON AFRICAN AMERICAN: 47 mL/min — AB (ref >60)
Glucose, Bld: 171 mg/dL — ABNORMAL HIGH (ref 65–99)
POTASSIUM: 3.6 mmol/L (ref 3.5–5.1)
SODIUM: 133 mmol/L — AB (ref 135–145)

## 2016-03-26 LAB — CBC
HCT: 38.3 % (ref 36.0–46.0)
HEMOGLOBIN: 13 g/dL (ref 12.0–15.0)
MCH: 31 pg (ref 26.0–34.0)
MCHC: 33.9 g/dL (ref 30.0–36.0)
MCV: 91.4 fL (ref 78.0–100.0)
PLATELETS: 182 10*3/uL (ref 150–400)
RBC: 4.19 MIL/uL (ref 3.87–5.11)
RDW: 12.6 % (ref 11.5–15.5)
WBC: 6.3 10*3/uL (ref 4.0–10.5)

## 2016-03-26 LAB — URINE CULTURE: Culture: >=100000 — AB

## 2016-03-26 LAB — GLUCOSE, CAPILLARY
GLUCOSE-CAPILLARY: 256 mg/dL — AB (ref 65–99)
Glucose-Capillary: 157 mg/dL — ABNORMAL HIGH (ref 65–99)
Glucose-Capillary: 145 mg/dL — ABNORMAL HIGH (ref 65–99)
Glucose-Capillary: 113 mg/dL — ABNORMAL HIGH (ref 65–99)

## 2016-03-26 LAB — RPR: RPR: NONREACTIVE

## 2016-03-26 SURGERY — CANCELLED PROCEDURE

## 2016-03-26 MED ORDER — SODIUM CHLORIDE 0.9 % IV SOLN: INTRAVENOUS | Status: DC

## 2016-03-26 MED ORDER — AMOXICILLIN 500 MG PO CAPS
500.0000 mg | ORAL_CAPSULE | Freq: Three times a day (TID) | ORAL | Status: DC
  Administered 2016-03-26 – 2016-03-27 (×4): 500 mg via ORAL
  Filled 2016-03-26 (×7): qty 1

## 2016-03-26 MED ORDER — MESALAMINE 1.2 G PO TBEC
2.4000 g | DELAYED_RELEASE_TABLET | Freq: Every day | ORAL | Status: DC
  Administered 2016-03-26 – 2016-03-27 (×2): 2.4 g via ORAL
  Filled 2016-03-26 (×3): qty 2

## 2016-03-26 MED ORDER — PREDNISONE 5 MG PO TABS
5.0000 mg | ORAL_TABLET | Freq: Two times a day (BID) | ORAL | Status: DC
Start: 2016-03-26 — End: 2016-03-27
  Administered 2016-03-26 – 2016-03-27 (×3): 5 mg via ORAL
  Filled 2016-03-26 (×3): qty 1

## 2016-03-26 MED ORDER — METOPROLOL TARTRATE 5 MG/5ML IV SOLN
5.0000 mg | Freq: Once | INTRAVENOUS | Status: AC
  Administered 2016-03-26: 5 mg via INTRAVENOUS
  Filled 2016-03-26: qty 5

## 2016-03-26 MED ORDER — ASPIRIN EC 81 MG PO TBEC
81.0000 mg | DELAYED_RELEASE_TABLET | Freq: Every day | ORAL | Status: DC
  Administered 2016-03-26 – 2016-03-27 (×2): 81 mg via ORAL
  Filled 2016-03-26 (×2): qty 1

## 2016-03-26 MED ORDER — ASPIRIN EC 81 MG PO TBEC: 81.0000 mg | DELAYED_RELEASE_TABLET | Freq: Every day | ORAL | Status: DC

## 2016-03-26 MED ORDER — DILTIAZEM HCL 30 MG PO TABS
60.0000 mg | ORAL_TABLET | Freq: Once | ORAL | Status: AC
  Administered 2016-03-26: 60 mg via ORAL

## 2016-03-26 MED ORDER — DILTIAZEM HCL 30 MG PO TABS
90.0000 mg | ORAL_TABLET | Freq: Two times a day (BID) | ORAL | Status: DC
  Administered 2016-03-26 – 2016-03-27 (×2): 90 mg via ORAL
  Filled 2016-03-26 (×2): qty 3

## 2016-03-26 MED ORDER — APIXABAN 5 MG PO TABS: 5.0000 mg | ORAL_TABLET | Freq: Two times a day (BID) | ORAL | Status: DC

## 2016-03-26 MED ORDER — APIXABAN 5 MG PO TABS
5.0000 mg | ORAL_TABLET | Freq: Two times a day (BID) | ORAL | Status: DC
  Administered 2016-03-26 – 2016-03-27 (×3): 5 mg via ORAL
  Filled 2016-03-26 (×3): qty 1

## 2016-03-26 MED ORDER — DILTIAZEM HCL ER 60 MG PO CP12
60.0000 mg | ORAL_CAPSULE | Freq: Once | ORAL | Status: DC
  Filled 2016-03-26: qty 1

## 2016-03-26 MED ORDER — CLOPIDOGREL BISULFATE 75 MG PO TABS
75.0000 mg | ORAL_TABLET | Freq: Every day | ORAL | Status: DC
  Administered 2016-03-26: 75 mg via ORAL

## 2016-03-26 MED ORDER — FAMOTIDINE 20 MG PO TABS
20.0000 mg | ORAL_TABLET | Freq: Every day | ORAL | Status: DC
  Administered 2016-03-26 – 2016-03-27 (×2): 20 mg via ORAL
  Filled 2016-03-26 (×2): qty 1

## 2016-03-26 MED ORDER — MIRABEGRON ER 25 MG PO TB24
25.0000 mg | ORAL_TABLET | Freq: Every day | ORAL | Status: DC
  Administered 2016-03-27: 25 mg via ORAL
  Filled 2016-03-26 (×2): qty 1

## 2016-03-26 NOTE — Progress Notes (Signed)
STROKE TEAM PROGRESS NOTE   SUBJECTIVE (INTERVAL HISTORY) Brother at bedside. TEE cancelled due to afib confirmed by cardiology on EKG 11/2015. Will initiate eliquis this time. Pt is sitting in bed for lunch and no complains. On Abx for UTI. Cre improved.    OBJECTIVE Temp:  [97.5 F (36.4 C)-98.3 F (36.8 C)] 97.9 F (36.6 C) (01/03 1013) Pulse Rate:  [60-79] 70 (01/03 1013) Cardiac Rhythm: Heart block (01/02 1940) Resp:  [14-20] 16 (01/03 1013) BP: (149-233)/(82-97) 152/92 (01/03 1013) SpO2:  [94 %-99 %] 99 % (01/03 1013)  CBC:  Recent Labs Lab 03/23/16 1750  03/25/16 0508 03/26/16 0443  WBC 10.4  < > 6.0 6.3  NEUTROABS 8.6*  --   --   --   HGB 14.1  < > 11.7* 13.0  HCT 42.0  < > 35.6* 38.3  MCV 93.3  < > 92.5 91.4  PLT 187  < > 165 182  < > = values in this interval not displayed.  Basic Metabolic Panel:   Recent Labs Lab 03/25/16 0508 03/26/16 0443  NA 133* 133*  K 4.1 3.6  CL 99* 94*  CO2 25 27  GLUCOSE 146* 171*  BUN 26* 18  CREATININE 1.26* 1.11*  CALCIUM 9.1 9.4    Lipid Panel:     Component Value Date/Time   CHOL 175 03/24/2016 0027   TRIG 154 (H) 03/24/2016 0027   TRIG 115 02/05/2006 1016   HDL 66 03/24/2016 0027   CHOLHDL 2.7 03/24/2016 0027   VLDL 31 03/24/2016 0027   LDLCALC 78 03/24/2016 0027   HgbA1c:  Lab Results  Component Value Date   HGBA1C 6.3 (H) 03/24/2016   Urine Drug Screen: No results found for: LABOPIA, COCAINSCRNUR, LABBENZ, AMPHETMU, THCU, LABBARB    IMAGING I have personally reviewed the radiological images below and agree with the radiology interpretations.  Dg Chest 2 View 03/23/2016 No acute cardiopulmonary process.   Ct Head Code Stroke W/o Cm 03/23/2016 1. No acute infarction suspected by CT. Old infarction in the right posterior parietal cortical and subcortical brain. 2. ASPECTS is 9, though the abnormality in M 6 is likely chronic. These results were paged by telephone at the time of interpretation on  03/23/2016 at 6:04 pm to Dr. Cheral Marker, awaiting response.   Mr Brain Wo Contrast 03/23/2016 Moderate to severely motion degraded examination. Acute versus subacute multi territorial infarcts including RIGHT MCA territory, most consistent with embolic phenomena.   Mr Jodene Nam Head Wo Contrast 03/24/2016 Negative for emergent large vessel occlusion. Widespread anterior circulation atherosclerosis, with relative sparing of the posterior circulation. No significant change suspected in severe anterior circulation stenoses since [facility] on 09/24/2015 (please see also that report): Severe stenoses of the left ICA supraclinoid segment and right MCA distal M1 segment.   2D Echocardiogram  - Left ventricle: The cavity size was normal. Wall thickness was increased in a pattern of moderate to severe LVH. There was mild focal basal and mild asymmetric hypertrophy of the septum. Systolic function was normal. The estimated ejection fraction was in the range of 55% to 60%. There was dynamic obstruction at rest, with a peak velocity of 120 cm/sec and a peak gradient of 6 mm Hg. Wall motion was normal; there were no regional wall motion abnormalities. - Aortic valve: Moderately calcified annulus. Trileaflet; normal thickness, mildly calcified leaflets. Valve area (VTI): 2.6 cm^2. Valve area (Vmax): 2.47 cm^2. Valve area (Vmean): 2.47 cm^2.  - Mitral valve: Moderately calcified annulus. There was systolic  anterior motion of the chordal structures.  Carotid Doppler   No significant (1-39%) ICA stenosis. Antegrade vertebral flow.  LE Venous Dopplers No evidence of DVT or baker's cyst.   PHYSICAL EXAM  Temp:  [97.5 F (36.4 C)-98.3 F (36.8 C)] 97.9 F (36.6 C) (01/03 1013) Pulse Rate:  [60-79] 70 (01/03 1013) Resp:  [14-20] 16 (01/03 1013) BP: (149-233)/(82-97) 152/92 (01/03 1013) SpO2:  [94 %-99 %] 99 % (01/03 1013)  General - Well nourished, well developed, in no apparent distress, mildly  sleepy.  Ophthalmologic - Fundi not visualized due to noncooperation.  Cardiovascular - Regular rate and rhythm.  Mental Status -  Level of arousal and orientation to place, and person were intact, however not orientated to time and age. Language including expression, naming, repetition, comprehension was assessed and found intact  Cranial Nerves II - XII - II - blinking to visual threat in both eyes. III, IV, VI - Extraocular movements intact. V - Facial sensation intact bilaterally. VII - Facial movement intact bilaterally. VIII - Hearing & vestibular intact bilaterally. X - Palate elevates symmetrically. XI - Chin turning & shoulder shrug intact bilaterally. XII - Tongue protrusion intact.  Motor Strength - The patient's strength was normal in all extremities except LUE 4/5 and neglect of LUE.  Bulk was normal and fasciculations were absent.   Motor Tone - Muscle tone was assessed at the neck and appendages and was normal.  Reflexes - The patient's reflexes were 1+ in all extremities and she had no pathological reflexes.  Sensory - Light touch, temperature/pinprick were assessed and were symmetrical.    Coordination - The patient had normal movements in the right hand with no ataxia or dysmetria. Left UE not tested due to neglect. Tremor was absent.  Gait and Station - deferred.   ASSESSMENT/PLAN Tammy Beard is a 76 y.o. female with history of CVA, ulcerative colitis, polymyalgia rheumatica, hypertension, and GERD  Presenting from her SNF with L sided weakness and altered mental status.    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 Patchy RIGHT frontoparietal lobes infarct, and punctate LEFT parietal lobe infarct  MRA  Anterior circulation atherosclerosis, severe L ICA siphon  and R M1 stenoses  Carotid Doppler  unremarkable  2D Echo  EF 55-60%  LE venous dopplers unremarkable  LDL 78  HgbA1c 6.3  EKG from September positive for atrial fibrillation. atrial fibrillation reviewed and confirmed by cardiology.   Eliquis for VTE prophylaxis DIET DYS 2 Room service appropriate? Yes; Fluid consistency: Thin  dipyridamole SR 250 mg/aspirin 25 mg twice a day prior to admission, given new diagnosis of paroxysmal atrial fibrillation, will change to eliquis for stroke prevention. However, will keep ASA 81mg  due to intracranial stenosis   Patient counseled to be compliant with her antithrombotic medications  Ongoing aggressive stroke risk factor management  Therapy recommendations:  SNF  Disposition:  pending (from [facility])  pAfib  Confirmed on EKG in 11/2015 by cardiology  On cardizem and metoprolol  Rate controlled  On eliquis now.   AKI due to dehydration  Cre 1.80 -1.58-1.26-1.11  On IVF   Close monitoring  UTI  UA WBC 6-30  Afebrile but mild leukocytosis   History of frequent UTI  On cefepime now  Urine culture >100k Enterococcus faecalis  Hypertension  Stable  Permissive hypertension (OK if < 220/120) but gradually normalize in 5-7 days  Long-term BP goal 130-150 due to severe intracranial stenosis  Avoid hypotension  Hyperlipidemia  Home meds:  lipitor 10, resumed in hospital  LDL 78, goal < 70  Continue statin at discharge  Other Stroke Risk Factors  Advanced age  ETOH use, advised to drink no more than 1 drink(s) a day  Hx stroke/TIA  11/2015 possible brainstem infarct with inability to follow horizontal or vertical gaze - no new stroke, frequent UTIs felt to be etiology. ED reports read as atrial fibrillation, but 30 d monitor only shows atrial tachycardia  09/2015 R ACA infact d/t intracranial stenosis, cannot rule out embolic source. Put on DAPT x 3 mo -> Aggrenox   Other Active  Problems  Ulcerative colitis  PMR, on steroids  R arm close undisplaced radius fracture - on cast  Hospital day # 2  Neurology will sign off. Please call with questions. Pt will follow up with Dr. Erlinda Hong at [facility] on 06/09/16. Thanks for the consult.  Rosalin Hawking, MD PhD Stroke Neurology 03/27/2016 12:03 AM   To contact Stroke Continuity provider, please refer to [email or web address] After hours, contact General Neurology

## 2016-03-26 NOTE — Progress Notes (Signed)
Patient up to chair with 2 assist using stedy. Chair alarm on. Will continue to monitor.

## 2016-03-26 NOTE — Progress Notes (Signed)
ANTICOAGULATION CONSULT NOTE - Initial Consult  Pharmacy Consult for Apixaban Indication:  H/o Nonvalvular Atrial Fibrillation  No Known Allergies  Patient Measurements: Height: 5\' 1"  (154.9 cm) Weight: 118 lb 1.6 oz (53.6 kg) IBW/kg (Calculated) : 47.8 Heparin Dosing Weight:  Vital Signs: Temp: 97.9 F (36.6 C) (01/03 1013) Temp Source: Oral (01/03 1013) BP: 152/92 (01/03 1013) Pulse Rate: 70 (01/03 1013)  Labs:  Recent Labs  03/23/16 1750  03/24/16 1448 03/25/16 0508 03/26/16 0443  HGB 14.1  < > 12.8 11.7* 13.0  HCT 42.0  < > 38.4 35.6* 38.3  PLT 187  --  238 165 182  APTT 25  --   --   --   --   LABPROT 12.6  --   --   --   --   INR 0.95  --   --   --   --   CREATININE 1.70*  < > 1.58* 1.26* 1.11*  < > = values in this interval not displayed.  Estimated Creatinine Clearance: 32.5 mL/min (by C-G formula based on SCr of 1.11 mg/dL (H)).   Medical History: Past Medical History:  Diagnosis Date  . Colitis   . Ecchymosis 11/15/2015  . GERD (gastroesophageal reflux disease)   . Heart murmur   . Hyperlipidemia   . Hypertension   . Osteopenia    s/p 7 yrs Fosamax  . Polio   . Polymyalgia ([place])   . Rheumatic fever   . Sleep disorder   . Stroke ([place])   . Ulcerative colitis     Medications:  Prescriptions Prior to Admission  Medication Sig Dispense Refill Last Dose  . acetaminophen (TYLENOL) 325 MG tablet Take 650 mg by mouth every 6 (six) hours as needed.   prn  . ARTIFICIAL TEAR SOLUTION OP Apply 1 drop to eye daily as needed (dry eyes).    03/22/2016 at Unknown time  . atorvastatin (LIPITOR) 10 MG tablet Take 1 tablet (10 mg total) by mouth daily at 6 PM.   03/22/2016 at Unknown time  . benazepril-hydrochlorthiazide (LOTENSIN HCT) 20-12.5 MG tablet Take 0.5 tablets by mouth daily.   03/22/2016 at Unknown time  . Calcium Carbonate-Vit D-Min (CALCIUM 600+D PLUS MINERALS) 600-400 MG-UNIT TABS Take 2 tablets by mouth 2 (two) times daily.    03/22/2016 at  Unknown time  . Cranberry-Vitamin C-Inulin (UTI-STAT) LIQD Take 30 mLs by mouth 2 (two) times daily.   03/22/2016 at Unknown time  . diltiazem (CARDIZEM) 60 MG tablet Take 1 tablet (60 mg total) by mouth every 12 (twelve) hours.   03/22/2016 at Unknown time  . dimenhyDRINATE (DRAMAMINE) 50 MG tablet Take 1 tablet every 6 hours as needed for nausea or dizziness 24 tablet 0 prn  . dipyridamole-aspirin (AGGRENOX) 200-25 MG 12hr capsule Take 1 capsule by mouth 2 (two) times daily. 60 capsule 1 03/22/2016 at Unknown time  . lactose free nutrition (BOOST) LIQD Take 237 mLs by mouth every evening. Drink one can daily    03/22/2016 at Unknown time  . loratadine (CLARITIN) 10 MG tablet Take 10 mg by mouth. Take one tablet daily   03/22/2016 at Unknown time  . mesalamine (LIALDA) 1.2 g EC tablet take 2 tablets by mouth daily with BREAKFAST 60 tablet 6 03/22/2016 at Unknown time  . metoprolol (LOPRESSOR) 50 MG tablet Take 1 tablet (50 mg total) by mouth 2 (two) times daily. (Patient taking differently: Take 25 mg by mouth 2 (two) times daily. )   03/22/2016 at 2100  .  mirabegron ER (MYRBETRIQ) 25 MG TB24 tablet Take 25 mg by mouth daily.   03/22/2016 at Unknown time  . Multiple Vitamin (MULTIVITAMIN) tablet Take 1 tablet by mouth daily.     03/22/2016 at Unknown time  . predniSONE (DELTASONE) 5 MG tablet TWO TABLETs DAILY TO TREAT POLYMYALGIA RHEUMATICA (Patient taking differently: Take 10 mg by mouth daily with breakfast. TWO TABLETs DAILY TO TREAT POLYMYALGIA RHEUMATICA) 180 tablet 2 03/22/2016 at Unknown time  . ranitidine (ZANTAC) 150 MG tablet Take 150 mg by mouth at bedtime. Take one tablet at bedtime    03/22/2016 at Unknown time  . sennosides-docusate sodium (SENOKOT-S) 8.6-50 MG tablet Take 1 tablet by mouth 3 (three) times daily. Take one tablet three times daily for constipation-hold for loose stools    03/22/2016 at Unknown time  . trimethoprim (TRIMPEX) 100 MG tablet Take 100 mg by mouth at bedtime.    03/22/2016 at Unknown time  . zinc oxide (RA ZINC OXIDE) 20 % ointment Apply 1 application topically. Apply as needed   03/22/2016 at Unknown time   Scheduled:  . amoxicillin  500 mg Oral Q8H  . [START ON 03/27/2016] aspirin EC  81 mg Oral Daily  . atorvastatin  10 mg Oral q1800  . calcium-vitamin D  2 tablet Oral BID WC  . diltiazem  60 mg Oral Q12H  . famotidine  20 mg Oral Daily  . heparin  5,000 Units Subcutaneous Q8H  . insulin aspart  0-15 Units Subcutaneous TID WC  . insulin aspart  0-5 Units Subcutaneous QHS  . lactose free nutrition  237 mL Oral QPM  . loratadine  10 mg Oral Daily  . mesalamine  2.4 g Oral Q breakfast  . mirabegron ER  25 mg Oral Daily  . predniSONE  5 mg Oral BID WC  . senna-docusate  1 tablet Oral TID    Assessment: 53 y,o female with CVA: R > L anterior circulation infarcts embolic secondary to unknown source. The patient was on dipyridamole SR 250 mg/aspirin 25 mg twice a day prior to admission, now on DAPT for stroke prevention. Given hx of atrial fibrillation, neurologist has changed to anticoagulation; Pharmacy consulted 03/26/16 to begin Apixaban (Eliquis) for h/o  Nonvalvular Atrial Fibrillation and to continue the ASA 81 mg daily  due to severe cerebral atherosclerosis.  Last received  SQ heparin VTE ppx dose this AM @ 05:19.- will discontinue this.  Age <80, weight <60 kg (=53.6 kg), and SCr <1.5 (=1.11), thus appropriate Apixaban dose is 5 mg po BID. The patient had AKI due to dehydration on admission with SCr = 1.8>1.58>1.26> now down to 1.11.  CBC is within normal.   Goal of Therapy:  Monitor platelets by anticoagulation protocol: Yes   Plan:  Discontinue SQ heparin VTE ppx Start Apixaban 5 mg po BID To continue ASA 81mg  daily as ordered by neurology due to severe cerebral atherosclerosis.  Monitor for signs and symptoms of bleeding. Monitor for renal insufficiency.   Thank you for allowing pharmacy to be part of this patients care team. Nicole Cella, [place] Clinical Pharmacist Pager: [phone] 03/26/2016,12:26 PM

## 2016-03-26 NOTE — Progress Notes (Signed)
PROGRESS NOTE    Tammy Beard  [id number] DOB: 1940/06/17 DOA: 03/23/2016 PCP: Jeanmarie Hubert, MD   Outpatient Specialists:    Brief Narrative:   Tammy Beard is a 76 y.o. female with medical history significant for history of CVA, ulcerative colitis, polymyalgia rheumatica, hypertension, and GERD who presents to the emergency department from her nursing home for evaluation of altered mental status and acute left-sided weakness. Patient has had a series of recent illnesses, including UTI treated at her nursing facility, and fracture of the right radius. She had, by report, otherwise been in her usual state until sometime yesterday afternoon when she was noted to be confused. Confusion was also noted by nursing staff when the patient woke this morning at approximately 8 AM. Later in the day, at approximately 3 PM, the patient was noted to have a left-sided facial droop and was not moving her left side. Patient's brother reports that she has had a very similar presentation in the past with acute illness. There's been no recent head trauma reported, patient has not been noted to have vomiting or diarrhea, and no fevers have been recorded. Patient is unable to provide much history at this time, which is therefore obtained through discussion with the ED personnel, nursing facility personnel, reported the patient's brother, and review of the EMR. 1/3 found to have a fib so TEE cancelled.  Patient started on eliquis.     Assessment & Plan:   Principal Problem:   Acute ischemic right MCA stroke (HCC) Active Problems:   Essential hypertension   Ulcerative colitis ([place])   Polymyalgia rheumatica (HCC)   History of CVA (cerebrovascular accident)   Acute kidney injury ([place])   GERD (gastroesophageal reflux disease)   Hyponatremia   UTI (urinary tract infection)   Traumatic closed nondisplaced fracture of distal end of right radius   Acute left-sided weakness   Acute encephalopathy  Hyperkalemia   CVA (cerebral vascular accident) ([place])    Acute left-sided weakness; acute encephalopathy   - Neurology consulting and much appreciated  - tPA not given as time of onset unclear and infectious or toxic-metabolic etiology appeared more likely  - MRI is motion-degraded, but notable for multiple acute vs subacute infarcts in right MCA territory suggestive of embolic etiology  -found on tele to have a fib so TEE cancelled-- eliquis started   UTI- entercoccus fecalis -change to PO abx -wears depends at SNF -sent message to Dr. McDiarmid to notify patient's here at brother's request  Acute kidney injury; hyperkalemia; hyponatremia - Suspect the AKI to be prerenal given her recent illnesses, confusion with poor oral intake  -improving   Hypertension  - resume home meds  Ulcerative colitis - Stable per recent PCP notes  - Resume mesalamine   Polymyalgia rheumatica  - Managed with prednisone 10 mg qD-- ? Adrenal insuff - no labs this AM-- will order - Will treat with IV hydrocortisone for now   Right radius fracture  - Immobilized in cast; neurovascularly intact distally  - PT/OT evals requested as above      Code Status: DNR   Family Communication: Called brother updated as much as I could 1/2  Disposition Plan:  SNF in AM?   Consultants:   neuro    Subjective: Very interactive No complaints today  Objective: Vitals:   03/26/16 0527 03/26/16 0937 03/26/16 1013 03/26/16 1300  BP: (!) 149/88 (!) 233/97 (!) 152/92 126/67  Pulse: 69 60 70 77  Resp: 20 14 16  16  Temp: 98.3 F (36.8 C) 97.8 F (36.6 C) 97.9 F (36.6 C) 97.4 F (36.3 C)  TempSrc: Oral Oral Oral Oral  SpO2: 98% 99% 99% 98%  Weight:      Height:        Intake/Output Summary (Last 24 hours) at 03/26/16 1441 Last data filed at 03/26/16 1246  Gross per 24 hour  Intake              340 ml  Output                0 ml  Net              340 ml   Filed Weights   03/23/16  1830 03/24/16 0000  Weight: 54 kg (119 lb) 53.6 kg (118 lb 1.6 oz)    Examination:  General exam: chronically ill appearing Respiratory system: Clear to auscultation. Respiratory effort normal. Cardiovascular system: irregular but rate controlled. Gastrointestinal system: Abdomen is nondistended, soft and nontender. No organomegaly or masses felt. Normal bowel sounds heard. Central nervous system: Alert     Data Reviewed: I have personally reviewed following labs and imaging studies  CBC:  Recent Labs Lab 03/23/16 1750 03/23/16 1800 03/24/16 1448 03/25/16 0508 03/26/16 0443  WBC 10.4  --  11.1* 6.0 6.3  NEUTROABS 8.6*  --   --   --   --   HGB 14.1 14.3 12.8 11.7* 13.0  HCT 42.0 42.0 38.4 35.6* 38.3  MCV 93.3  --  93.2 92.5 91.4  PLT 187  --  238 165 [id number]   Basic Metabolic Panel:  Recent Labs Lab 03/23/16 1750 03/23/16 1800 03/24/16 0027 03/24/16 1448 03/25/16 0508 03/26/16 0443  NA 131* 130*  --  133* 133* 133*  K 5.3* 5.4* 4.4 3.8 4.1 3.6  CL 92* 93*  --  96* 99* 94*  CO2 27  --   --  24 25 27   GLUCOSE 186* 187*  --  230* 146* 171*  BUN 38* 56*  --  30* 26* 18  CREATININE 1.70* 1.80*  --  1.58* 1.26* 1.11*  CALCIUM 10.8*  --   --  9.0 9.1 9.4   GFR: Estimated Creatinine Clearance: 32.5 mL/min (by C-G formula based on SCr of 1.11 mg/dL (H)). Liver Function Tests:  Recent Labs Lab 03/23/16 1750  AST 28  ALT 18  ALKPHOS 75  BILITOT 0.8  PROT 7.9  ALBUMIN 3.7   No results for input(s): LIPASE, AMYLASE in the last 168 hours.  Recent Labs Lab 03/24/16 0027  AMMONIA 42*   Coagulation Profile:  Recent Labs Lab 03/23/16 1750  INR 0.95   Cardiac Enzymes: No results for input(s): CKTOTAL, CKMB, CKMBINDEX, TROPONINI in the last 168 hours. BNP (last 3 results) No results for input(s): PROBNP in the last 8760 hours. HbA1C:  Recent Labs  03/24/16 0027  HGBA1C 6.3*   CBG:  Recent Labs Lab 03/23/16 1750 03/25/16 2129 03/26/16 0612  03/26/16 1131  GLUCAP 189* 220* 157* 145*   Lipid Profile:  Recent Labs  03/24/16 0027  CHOL 175  HDL 66  LDLCALC 78  TRIG 154*  CHOLHDL 2.7   Thyroid Function Tests:  Recent Labs  03/24/16 0027  TSH 1.573   Anemia Panel:  Recent Labs  03/24/16 0027  VITAMINB12 651   Urine analysis:    Component Value Date/Time   COLORURINE YELLOW 03/23/2016 1931   APPEARANCEUR HAZY (A) 03/23/2016 1931   LABSPEC 1.014 03/23/2016 1931  PHURINE 6.0 03/23/2016 1931   GLUCOSEU NEGATIVE 03/23/2016 1931   HGBUR NEGATIVE 03/23/2016 1931   HGBUR negative 09/06/2009 1459   BILIRUBINUR NEGATIVE 03/23/2016 1931   BILIRUBINUR NEG 08/23/2010 1440   KETONESUR NEGATIVE 03/23/2016 1931   PROTEINUR NEGATIVE 03/23/2016 1931   UROBILINOGEN 0.2 08/23/2010 1440   UROBILINOGEN 0.2 09/06/2009 1459   NITRITE NEGATIVE 03/23/2016 1931   LEUKOCYTESUR TRACE (A) 03/23/2016 1931     ) Recent Results (from the past 240 hour(s))  Urine culture     Status: Abnormal   Collection Time: 03/23/16  7:31 PM  Result Value Ref Range Status   Specimen Description URINE, CATHETERIZED  Final   Special Requests NONE  Final   Culture >=100,000 COLONIES/mL ENTEROCOCCUS FAECALIS (A)  Final   Report Status 03/26/2016 FINAL  Final   Organism ID, Bacteria ENTEROCOCCUS FAECALIS (A)  Final      Susceptibility   Enterococcus faecalis - MIC*    AMPICILLIN <=2 SENSITIVE Sensitive     LEVOFLOXACIN >=8 RESISTANT Resistant     NITROFURANTOIN <=16 SENSITIVE Sensitive     VANCOMYCIN 1 SENSITIVE Sensitive     * >=100,000 COLONIES/mL ENTEROCOCCUS FAECALIS  MRSA PCR Screening     Status: None   Collection Time: 03/24/16  6:27 AM  Result Value Ref Range Status   MRSA by PCR NEGATIVE NEGATIVE Final    Comment:        The GeneXpert MRSA Assay (FDA approved for NASAL specimens only), is one component of a comprehensive MRSA colonization surveillance program. It is not intended to diagnose MRSA infection nor to guide  or monitor treatment for MRSA infections.       Anti-infectives    Start     Dose/Rate Route Frequency Ordered Stop   03/26/16 0930  amoxicillin (AMOXIL) capsule 500 mg     500 mg Oral Every 8 hours 03/26/16 0905     03/24/16 0000  ceFEPIme (MAXIPIME) 1 g in dextrose 5 % 50 mL IVPB  Status:  Discontinued     1 g 100 mL/hr over 30 Minutes Intravenous Every 24 hours 03/23/16 2245 03/26/16 0905   03/23/16 2115  cefTRIAXone (ROCEPHIN) 1 g in dextrose 5 % 50 mL IVPB     1 g 100 mL/hr over 30 Minutes Intravenous  Once 03/23/16 2112 03/23/16 2221       Radiology Studies: No results found.      Scheduled Meds: . amoxicillin  500 mg Oral Q8H  . apixaban  5 mg Oral BID  . aspirin EC  81 mg Oral Daily  . atorvastatin  10 mg Oral q1800  . calcium-vitamin D  2 tablet Oral BID WC  . diltiazem  60 mg Oral Q12H  . famotidine  20 mg Oral Daily  . insulin aspart  0-15 Units Subcutaneous TID WC  . insulin aspart  0-5 Units Subcutaneous QHS  . lactose free nutrition  237 mL Oral QPM  . loratadine  10 mg Oral Daily  . mesalamine  2.4 g Oral Q breakfast  . mirabegron ER  25 mg Oral Daily  . predniSONE  5 mg Oral BID WC  . senna-docusate  1 tablet Oral TID   Continuous Infusions:   LOS: 2 days    Time spent: 25 min    [place], DO [facility] Pager [phone]  If 7PM-7AM, please contact night-coverage [email or web address] Password [email or web address] 03/26/2016, 2:41 PM

## 2016-03-26 NOTE — Progress Notes (Signed)
Physical Therapy Treatment Patient Details Name: Tammy Beard MRN: [id number] DOB: 1940-08-04 Today's Date: 03/26/2016    History of Present Illness Tammy Beard is a 76 y.o. female with medical history significant for history of CVA, ulcerative colitis, polymyalgia rheumatica, hypertension, and GERD who presents to the emergency department from her nursing home for evaluation of altered mental status and acute left-sided weakness. Patient has had a series of recent illnesses, including UTI treated at her nursing facility, and fracture of the right radius. MRI revealed acute R MCA CVA.    PT Comments    Pt continues to have decreased awareness of where she is in space.  Next session, she would benefit from mirror in sitting to see if some visual input would help her awareness.  Pt continues to be appropriate for SNF level rehab at discharge.    Follow Up Recommendations  SNF;Supervision/Assistance - 24 hour     Equipment Recommendations  None recommended by PT    Recommendations for Other Services   NA     Precautions / Restrictions Precautions Precautions: Fall Precaution Comments: watch BP ( question orthostatic) Restrictions RUE Weight Bearing: Non weight bearing    Mobility  Bed Mobility Overal bed mobility: Needs Assistance Bed Mobility: Rolling;Supine to Sit Rolling: Max assist   Supine to sit: Max assist;+2 for physical assistance     General bed mobility comments: Pt reaching with R UE but requires (A) to elevate trunk from surface and verbalizes I dont know if i can.  Assist provided at LEs, trunk and pelvis to scoot forward. Pt fearful of falling with transitions.   Transfers Overall transfer level: Needs assistance Equipment used: 2 person hand held assist Transfers: Sit to/from [facility] Sit to Stand: +2 physical assistance;Max assist Stand pivot transfers: +2 physical assistance;Max assist       General transfer comment: pt with  posterior lean with transfer. pt reports fear of falling during transfer. Pt requires (A) to anterior weight shift to transfer safely, assist provided at gait belt and with bed pad (initally) to help anteriorly weight shift pelvis .   Ambulation/Gait             General Gait Details: unable at this time.        Modified Rankin (Stroke Patients Only) Modified Rankin (Stroke Patients Only) Pre-Morbid Rankin Score: Severe disability Modified Rankin: Severe disability     Balance Overall balance assessment: Needs assistance Sitting-balance support: Bilateral upper extremity supported;Single extremity supported Sitting balance-Leahy Scale: Zero Sitting balance - Comments: posterior R lean. pt holding with R UE and verbalizes "i am falling L" Mod to max assist EOB with immediate posterior right lean when assist lessened at trunk.    Standing balance support: Bilateral upper extremity supported Standing balance-Leahy Scale: Zero Standing balance comment: Pt feels like she is foing to fall during transition to chair.  Even once seated she reports she feels like she is going to fall out of the front of the chair.  Pt reporting "I don't feel right"  BP significantly lower than when supine in bed.  (109/75 after transfer).  Pt reclined and felt better.                      Cognition Arousal/Alertness: Awake/alert Behavior During Therapy: WFL for tasks assessed/performed Overall Cognitive Status: Impaired/Different from baseline Area of Impairment: Memory;Problem solving;Orientation;Awareness;Safety/judgement;Following commands Orientation Level: Time   Memory: Decreased recall of precautions;Decreased short-term memory Following Commands: Follows one  step commands inconsistently;Follows one step commands with increased time Safety/Judgement: Decreased awareness of safety;Decreased awareness of deficits Awareness: Intellectual Problem Solving: Decreased initiation;Difficulty  sequencing;Requires verbal cues;Requires tactile cues General Comments: Pt asked to move EOB, unable to initiate movement despite verbal agreeance to move EOB.  Manual and tactile cues needed to initiate, when asked which way she is leaning she reports "left" and she is leaning posteriorly and right.            Pertinent Vitals/Pain Pain Assessment: No/denies pain Faces Pain Scale: No hurt    Home Living Family/patient expects to be discharged to:: Skilled nursing facility                        PT Goals (current goals can now be found in the care plan section) Acute Rehab PT Goals Patient Stated Goal: return to [facility]. Be able to walk Progress towards PT goals: Progressing toward goals    Frequency    Min 3X/week      PT Plan Current plan remains appropriate    Co-evaluation PT/OT/SLP Co-Evaluation/Treatment: Yes Reason for Co-Treatment: Complexity of the patient's impairments (multi-system involvement);Necessary to address cognition/behavior during functional activity;For patient/therapist safety PT goals addressed during session: Mobility/safety with mobility;Balance OT goals addressed during session: ADL's and self-care;Strengthening/ROM     End of Session Equipment Utilized During Treatment: Gait belt Activity Tolerance: Other (comment) (limited by fear ) Patient left: in chair;with call bell/phone within reach;with chair alarm set     Time: [id number] PT Time Calculation (min) (ACUTE ONLY): 20 min  Charges:  $Therapeutic Activity: 8-22 mins                      Tammy Beard B. [place], [place], DPT [phone]   03/26/2016, 3:21 PM

## 2016-03-26 NOTE — Progress Notes (Signed)
CCMD notified RN that patient's HR increases to 140s intermittently. Notified it happened 11x in 1 hr.  Cardizem had not been given d/t held medications.  MD notified. Cardizem given.

## 2016-03-26 NOTE — Discharge Instructions (Signed)
Information on my medicine - ELIQUIS (apixaban)  TWhy was Eliquis prescribed for you? Eliquis was prescribed for you to reduce the risk of a blood clot forming that can cause a stroke if you have a medical condition called atrial fibrillation (a type of irregular heartbeat).  What do You need to know about Eliquis ? Take your Eliquis TWICE DAILY - one tablet in the morning and one tablet in the evening with or without food. If you have difficulty swallowing the tablet whole please discuss with your pharmacist how to take the medication safely.  Take Eliquis exactly as prescribed by your doctor and DO NOT stop taking Eliquis without talking to the doctor who prescribed the medication.  Stopping may increase your risk of developing a stroke.  Refill your prescription before you run out.  After discharge, you should have regular check-up appointments with your healthcare provider that is prescribing your Eliquis.  In the future your dose may need to be changed if your kidney function or weight changes by a significant amount or as you get older.  What do you do if you miss a dose? If you miss a dose, take it as soon as you remember on the same day and resume taking twice daily.  Do not take more than one dose of ELIQUIS at the same time to make up a missed dose.  Important Safety Information A possible side effect of Eliquis is bleeding. You should call your healthcare provider right away if you experience any of the following: ? Bleeding from an injury or your nose that does not stop. ? Unusual colored urine (red or dark brown) or unusual colored stools (red or black). ? Unusual bruising for unknown reasons. ? A serious fall or if you hit your head (even if there is no bleeding).  Some medicines may interact with Eliquis and might increase your risk of bleeding or clotting while on Eliquis. To help avoid this, consult your healthcare provider or pharmacist prior to using any new  prescription or non-prescription medications, including herbals, vitamins, non-steroidal anti-inflammatory drugs (NSAIDs) and supplements.  This website has more information on Eliquis (apixaban): http://www.eliquis.com/eliquis/home 

## 2016-03-26 NOTE — Progress Notes (Addendum)
CCMD notified patient's HR still intermittently in 140s. Patient currently sitting up in chair eating. BP 202/84. MD notified.  Will recheck bp and hr after administration. CCMD was notified to notify of tachy hr   1851- patient's bp 188/64, pulse 74. Patient sleeping in chair, arouses easily. MD notified of follow up bp and pulse.

## 2016-03-26 NOTE — Progress Notes (Signed)
Occupational Therapy Treatment Patient Details Name: Tammy Beard MRN: [id number] DOB: 12/26/1940 Today's Date: 03/26/2016    History of present illness Tammy Beard is a 76 y.o. female with medical history significant for history of CVA, ulcerative colitis, polymyalgia rheumatica, hypertension, and GERD who presents to the emergency department from her nursing home for evaluation of altered mental status and acute left-sided weakness. Patient has had a series of recent illnesses, including UTI treated at her nursing facility, and fracture of the right radius. MRI revealed acute R MCA CVA.   OT comments  Pt supine BP 154/54 and once in chair BP 109/75 question orthostatic with mobility. Pt noted to have nystagmus supine on arrival. Pt with poor awareness to body position in space during transfer. Pt reports falling L when actually leaning toward the R .   Follow Up Recommendations  SNF;Supervision/Assistance - 24 hour    Equipment Recommendations  None recommended by OT    Recommendations for Other Services      Precautions / Restrictions Precautions Precautions: Fall Precaution Comments: watch BP ( question orthostatic) Restrictions RUE Weight Bearing: Non weight bearing       Mobility Bed Mobility Overal bed mobility: Needs Assistance Bed Mobility: Rolling;Supine to Sit Rolling: Max assist   Supine to sit: Max assist;+2 for physical assistance     General bed mobility comments: Pt reaching with R UE but requires (A) to elevate trunk from surface and verbalizes I dont know if i can  Transfers Overall transfer level: Needs assistance Equipment used: 2 person hand held assist Transfers: Sit to/from [facility] Sit to Stand: +2 physical assistance;Max assist Stand pivot transfers: +2 physical assistance;Max assist       General transfer comment: pt with posterior lean with transfer. pt reports fear of falling during transfer. Pt requires (A) to  anterior weight shift to transfer safely    Balance Overall balance assessment: Needs assistance Sitting-balance support: Bilateral upper extremity supported;Feet supported Sitting balance-Leahy Scale: Zero Sitting balance - Comments: posterior R lean. pt holding with R UE and verbalizes "i am falling L"    Standing balance support: Bilateral upper extremity supported Standing balance-Leahy Scale: Zero Standing balance comment: pt reports "oozy" and noted to have BP 109/75 once in chair                    ADL Overall ADL's : Needs assistance/impaired     Grooming: Wash/dry face;Set up;Sitting Grooming Details (indicate cue type and reason): wiping mouth with R UE in cast Upper Body Bathing: Maximal assistance   Lower Body Bathing: Maximal assistance                         General ADL Comments: Pt incontinent of bladder on arrival. pt demonstrates nystagmus supine in bed attempting to track R to L. pt with R gaze preference      Vision                     Perception     Praxis      Cognition   Behavior During Therapy: [facility] for tasks assessed/performed Overall Cognitive Status: Impaired/Different from baseline Area of Impairment: Memory;Problem solving;Orientation;Awareness;Safety/judgement;Following commands Orientation Level: Time   Memory: Decreased recall of precautions;Decreased short-term memory  Following Commands: Follows one step commands inconsistently;Follows one step commands with increased time Safety/Judgement: Decreased awareness of safety;Decreased awareness of deficits Awareness: Intellectual Problem Solving: Decreased initiation;Difficulty sequencing;Requires verbal cues;Requires  tactile cues General Comments: pt reports "i broke my arm last week. " OT ask "what was the date?" pt states "this month" OT ask "what month is this?" Pt states "i dont know .Marland Kitchen... pause .... February" Pt not oriented to time or place    Extremity/Trunk  Assessment               Exercises     Shoulder Instructions       General Comments      Pertinent Vitals/ Pain       Pain Assessment: No/denies pain  Home Living Family/patient expects to be discharged to:: Skilled nursing facility                                        Prior Functioning/Environment              Frequency  Min 2X/week        Progress Toward Goals  OT Goals(current goals can now be found in the care plan section)  Progress towards OT goals: Progressing toward goals  Acute Rehab OT Goals Patient Stated Goal: return to [facility]. Be able to walk OT Goal Formulation: With patient/family Time For Goal Achievement: 04/07/16 Potential to Achieve Goals: Fair ADL Goals Pt Will Perform Eating: with set-up;with adaptive utensils;sitting Pt Will Perform Grooming: with min assist;sitting Pt Will Transfer to Toilet: with min assist;ambulating;bedside commode  Plan Discharge plan remains appropriate    Co-evaluation    PT/OT/SLP Co-Evaluation/Treatment: Yes Reason for Co-Treatment: Complexity of the patient's impairments (multi-system involvement);For patient/therapist safety;To address functional/ADL transfers PT goals addressed during session: Mobility/safety with mobility;Balance OT goals addressed during session: ADL's and self-care;Strengthening/ROM      End of Session Equipment Utilized During Treatment: Gait belt   Activity Tolerance Patient tolerated treatment well   Patient Left in chair;with call bell/phone within reach;with chair alarm set;with family/visitor present   Nurse Communication Mobility status;Precautions        Time: [id number] OT Time Calculation (min): 18 min  Charges: OT General Charges $OT Visit: 1 Procedure OT Treatments $Therapeutic Activity: 8-22 mins  [name] 03/26/2016, 1:59 PM    Jeri Modena   OTR/L Pager: [phone] Office: [phone] .

## 2016-03-26 NOTE — Progress Notes (Signed)
Speech Language Pathology Treatment: Dysphagia  Patient Details Name: Tammy Beard MRN: [id number] DOB: 10/31/1940 Today's Date: 03/26/2016 Time: 1429-1445 SLP Time Calculation (min) (ACUTE ONLY): 16 min  Assessment / Plan / Recommendation Clinical Impression  Pt known to this SLP from outside the hospital setting. She has always been verbose and was unable to self regulate given frequent moderate cues to not verbalize after swallow. She has awareness and states the strategy but has been unable to demonstrate. Baseline intermittent wet vocal quality prior and during session. Pt states she eats a "chopped" texture at [facility]. Masticated Dys 3 texture without difficulty however recommend continue Dys 2 and thin. Pt is close to or at baseline swallow status. Will discharge at this time.    HPI HPI: 76 year old female admitted 03/23/16 with left weakness, AMS, and left facial droop. PMH significant for CVA, ulcerative colitis, polymyalgia rheumatica, HTN, GERD, UTI, mental retardation. MRI reveals acute vs subacute multi-territorial infarcts - RMCA. CXR negative.      SLP Plan  All goals met;Discharge SLP treatment due to (comment)     Recommendations  Diet recommendations: Dysphagia 2 (fine chop);Thin liquid Liquids provided via: Cup;Straw Medication Administration: Crushed with puree Supervision: Patient able to self feed;Full supervision/cueing for compensatory strategies Compensations: Minimize environmental distractions;Small sips/bites;Slow rate Postural Changes and/or Swallow Maneuvers: Seated upright 90 degrees                Oral Care Recommendations: Oral care BID Follow up Recommendations: None Plan: All goals met;Discharge SLP treatment due to (comment)       GO                ,  Willis 03/26/2016, 3:06 PM   Willis  M.Ed CCC-SLP Pager [phone]     

## 2016-03-26 NOTE — Progress Notes (Signed)
Visited w/ pt, providing spiritual/emotional support and prayer, which pt appreciated. When noticed as we talked that pt's face was bleeding slightly and there was a little fresh blood on two of her fingers, let pt know I'd call nurse, who immediately came to check, said pt had likely been picking at her face. Chaplain available for f/u.    03/26/16 1600  Clinical Encounter Type  Visited With Patient  Visit Type Initial;Psychological support;Spiritual support;Social support  Referral From Chaplain  Spiritual Encounters  Spiritual Needs Prayer;Emotional  Stress Factors  Patient Stress Factors Health changes;Loss of control   Gerrit Heck, Chaplain

## 2016-03-26 NOTE — Progress Notes (Signed)
Patient with documented hx of atrial fibrillation on EKD from 11/30/2015. Will cancel TEE and loop. Ok to feed pt. Dr. Erlinda Hong will discuss with her on rounds.   Standard [place] for Pager information 03/26/2016 9:46 AM

## 2016-03-26 NOTE — Progress Notes (Signed)
RN and charge RN attempted to call patient's brother, who is patient's POA to obtain informed consent. Patient's brother did not answer with two phone numbers available

## 2016-03-26 NOTE — Progress Notes (Signed)
Patient admitted to Endo unit. Pre-procedure work up complete. Received call from [facility], NP to cancel TEE due to patients prior hx of a fib. Patient returned to unit.

## 2016-03-27 ENCOUNTER — Encounter (HOSPITAL_COMMUNITY): Payer: Self-pay | Admitting: General Practice

## 2016-03-27 ENCOUNTER — Encounter (HOSPITAL_COMMUNITY): Payer: Self-pay | Admitting: Emergency Medicine

## 2016-03-27 ENCOUNTER — Emergency Department (HOSPITAL_COMMUNITY): Payer: Medicare Other

## 2016-03-27 ENCOUNTER — Emergency Department (HOSPITAL_COMMUNITY)
Admission: EM | Admit: 2016-03-27 | Discharge: 2016-03-28 | Disposition: A | Discharge status: Home / Self Care | Payer: Medicare Other | Attending: Emergency Medicine | Admitting: Emergency Medicine

## 2016-03-27 DIAGNOSIS — I480 Paroxysmal atrial fibrillation: Secondary | ICD-10-CM

## 2016-03-27 DIAGNOSIS — I10 Essential (primary) hypertension: Secondary | ICD-10-CM | POA: Diagnosis not present

## 2016-03-27 DIAGNOSIS — I639 Cerebral infarction, unspecified: Secondary | ICD-10-CM | POA: Diagnosis not present

## 2016-03-27 DIAGNOSIS — Z79899 Other long term (current) drug therapy: Secondary | ICD-10-CM | POA: Insufficient documentation

## 2016-03-27 DIAGNOSIS — Z8673 Personal history of transient ischemic attack (TIA), and cerebral infarction without residual deficits: Secondary | ICD-10-CM | POA: Insufficient documentation

## 2016-03-27 DIAGNOSIS — Z7901 Long term (current) use of anticoagulants: Secondary | ICD-10-CM | POA: Insufficient documentation

## 2016-03-27 DIAGNOSIS — R930 Abnormal findings on diagnostic imaging of skull and head, not elsewhere classified: Secondary | ICD-10-CM | POA: Insufficient documentation

## 2016-03-27 DIAGNOSIS — Z7982 Long term (current) use of aspirin: Secondary | ICD-10-CM | POA: Insufficient documentation

## 2016-03-27 DIAGNOSIS — I517 Cardiomegaly: Secondary | ICD-10-CM | POA: Diagnosis not present

## 2016-03-27 LAB — CBC WITH DIFFERENTIAL/PLATELET
BASOS ABS: 0 10*3/uL (ref 0.0–0.1)
BASOS PCT: 0 %
EOS PCT: 0 %
Eosinophils Absolute: 0 10*3/uL (ref 0.0–0.7)
HCT: 40.1 % (ref 36.0–46.0)
Hemoglobin: 13.9 g/dL (ref 12.0–15.0)
Lymphocytes Relative: 12 %
Lymphs Abs: 1.1 10*3/uL (ref 0.7–4.0)
MCH: 31.3 pg (ref 26.0–34.0)
MCHC: 34.7 g/dL (ref 30.0–36.0)
MCV: 90.3 fL (ref 78.0–100.0)
MONO ABS: 1 10*3/uL (ref 0.1–1.0)
Monocytes Relative: 11 %
Neutro Abs: 7.1 10*3/uL (ref 1.7–7.7)
Neutrophils Relative %: 77 %
PLATELETS: 206 10*3/uL (ref 150–400)
RBC: 4.44 MIL/uL (ref 3.87–5.11)
RDW: 12.4 % (ref 11.5–15.5)
WBC: 9.1 10*3/uL (ref 4.0–10.5)

## 2016-03-27 LAB — BASIC METABOLIC PANEL
ANION GAP: 13 (ref 5–15)
BUN: 19 mg/dL (ref 6–20)
CALCIUM: 11.3 mg/dL — AB (ref 8.9–10.3)
CO2: 28 mmol/L (ref 22–32)
Chloride: 89 mmol/L — ABNORMAL LOW (ref 101–111)
Creatinine, Ser: 1.09 mg/dL — ABNORMAL HIGH (ref 0.44–1.00)
GFR, EST AFRICAN AMERICAN: 56 mL/min — AB (ref >60)
GFR, EST NON AFRICAN AMERICAN: 48 mL/min — AB (ref >60)
Glucose, Bld: 154 mg/dL — ABNORMAL HIGH (ref 65–99)
Potassium: 3.8 mmol/L (ref 3.5–5.1)
SODIUM: 130 mmol/L — AB (ref 135–145)

## 2016-03-27 LAB — GLUCOSE, CAPILLARY
GLUCOSE-CAPILLARY: 169 mg/dL — AB (ref 65–99)
GLUCOSE-CAPILLARY: 182 mg/dL — AB (ref 65–99)
Glucose-Capillary: 128 mg/dL — ABNORMAL HIGH (ref 65–99)

## 2016-03-27 MED ORDER — SODIUM CHLORIDE 0.9 % IV BOLUS (SEPSIS)
500.0000 mL | Freq: Once | INTRAVENOUS | Status: AC
  Administered 2016-03-27: 500 mL via INTRAVENOUS

## 2016-03-27 MED ORDER — DILTIAZEM HCL 90 MG PO TABS
90.0000 mg | ORAL_TABLET | Freq: Once | ORAL | Status: AC
  Administered 2016-03-28: 90 mg via ORAL
  Filled 2016-03-27: qty 1

## 2016-03-27 MED ORDER — APIXABAN 5 MG PO TABS: 5.0000 mg | ORAL_TABLET | Freq: Two times a day (BID) | ORAL | Status: DC

## 2016-03-27 MED ORDER — DILTIAZEM HCL 90 MG PO TABS: 90.0000 mg | ORAL_TABLET | Freq: Two times a day (BID) | ORAL | Status: DC

## 2016-03-27 MED ORDER — AMOXICILLIN 500 MG PO CAPS: 500.0000 mg | ORAL_CAPSULE | Freq: Three times a day (TID) | ORAL | Status: DC

## 2016-03-27 MED ORDER — PREDNISONE 5 MG PO TABS: 10.0000 mg | ORAL_TABLET | Freq: Every day | ORAL | Status: DC

## 2016-03-27 MED ORDER — ASPIRIN 81 MG PO TBEC: 81.0000 mg | DELAYED_RELEASE_TABLET | Freq: Every day | ORAL | Status: DC

## 2016-03-27 MED ORDER — DILTIAZEM HCL 30 MG PO TABS: 120.0000 mg | ORAL_TABLET | Freq: Two times a day (BID) | ORAL | Status: DC

## 2016-03-27 MED ORDER — METOPROLOL TARTRATE 25 MG PO TABS
25.0000 mg | ORAL_TABLET | Freq: Once | ORAL | Status: AC
  Administered 2016-03-28: 25 mg via ORAL
  Filled 2016-03-27: qty 1

## 2016-03-27 MED ORDER — METOPROLOL TARTRATE 50 MG PO TABS: 25.0000 mg | ORAL_TABLET | Freq: Two times a day (BID) | ORAL | Status: DC

## 2016-03-27 NOTE — Clinical Social Work Note (Signed)
Clinical Social Work Assessment  Patient Details  Name: Tammy Beard MRN: [id number] Date of Birth: 03-03-1941  Date of referral:  03/25/16               Reason for consult:  Facility Placement                Permission sought to share information with:  Family Supports Permission granted to share information::  Yes, Verbal Permission Granted  Name::     [place]::     Relationship::  Environmental manager Information:  [phone] (mobile), [phone] (home)  Housing/Transportation Living arrangements for the past 2 months:  [facility] ([place] care facility) Source of Information:  Patient, Other (Comment Required) (Brother Laurey Arrow and [place] admissions SW) Patient Interpreter Needed:  None Criminal Activity/Legal Involvement Pertinent to Current Situation/Hospitalization:  No - Comment as needed Significant Relationships:  Siblings Lives with:  Facility Resident ([facility] skilled facility and on [place] complex in an apartment) Do you feel safe going back to the place where you live?  No (Brother and patient agreeable to her returning to rehab before going home) Need for family participation in patient care:  Yes (Comment)  Care giving concerns:  Brother and patient in agreement with patient returning to [place] facility before going back to her apartment, for safety and strengthening.  Social Worker assessment / plan: CSW talked with patient's brother Griselle Killoren by phone and patient at the bedside regarding discharge disposition. Both in agreement with patient returning to [facility] skilled facility to continue rehab before returning to her apartment on [place] complex. Brother and patient agreeable to ambulance transport back to facility.  Employment status:  Retired Forensic scientist:  Medicare PT Recommendations:  [place] / Referral to community resources:     Patient/Family's Response to care:   No concerns expressed regarding patient's care during hospitalization.  Patient/Family's Understanding of and Emotional Response to Diagnosis, Current Treatment, and Prognosis:  Not discussed.  Emotional Assessment Appearance:  Appears stated age Attitude/Demeanor/Rapport:  Other (Appropriate) Affect (typically observed):  Appropriate Orientation:  Oriented to Self, Oriented to Place Alcohol / Substance use:  Never Used Psych involvement (Current and /or in the community):  No (Comment)  Discharge Needs  Concerns to be addressed:  Discharge Planning Concerns Readmission within the last 30 days:  No Current discharge risk:  None Barriers to Discharge:  No Barriers Identified   Sable Feil, LCSW 03/27/2016, 3:39 PM

## 2016-03-27 NOTE — Care Management Note (Signed)
Case Management Note  Patient Details  Name: LIBBIE KNODLE MRN: [id number] Date of Birth: 03-22-1941  Subjective/Objective:                    Action/Plan: Pt discharging to [facility]. No further needs per CM.   Expected Discharge Date:                  Expected Discharge Plan:  Skilled Nursing Facility  In-House Referral:  Clinical Social Work  Discharge planning Services     Post Acute Care Choice:    Choice offered to:     DME Arranged:    DME Agency:     HH Arranged:    [place] Agency:     Status of Service:  Completed, signed off  If discussed at [employer], dates discussed:    Additional Comments:  Pollie Friar, RN 03/27/2016, 4:04 PM

## 2016-03-27 NOTE — ED Provider Notes (Signed)
[facility] DEPT Provider Note   CSN: [id number] Arrival date & time: 03/27/16  2112     History   Chief Complaint Chief Complaint  Patient presents with  . Hypertension    HPI Tammy Beard is a 76 y.o. female.  HPI  76 year old female who presents with elevated BP. 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 Upon arrival to skilled nursing facility, SBP > 200 and patient was brought back to ED by EMS. Per patient's brother, patient today has had difficulty getting BP controlled prior to discharge, but was able to get SBP < 200 and discharged. With residual left sided weakness and neglect. Brother states that it takes her a little longer for her process things today than the day before, and he was concerned that she may be dehydrated. No fever or chills, chest pain, sob, cough, abd pain, n/v/d. Getting treated for UTI with amoxicillin on discharge. Did complain of headache earlier today.  Past Medical History:  Diagnosis Date  . Colitis   . Ecchymosis 11/15/2015  . Fracture of right radius 02/2016  . GERD (gastroesophageal reflux disease)   . Heart murmur   . Hyperlipidemia   . Hypertension   . Osteopenia    s/p 7 yrs Fosamax  . Polio   . Polymyalgia ([place])   . Rheumatic fever   . Sleep disorder   . Stroke ([place])   . Ulcerative colitis     Patient Active Problem List   Diagnosis Date Noted  . Paroxysmal atrial fibrillation (HCC)   . Acute ischemic right MCA stroke ([place]) 03/24/2016  . CVA (cerebral vascular accident) ([place]) 03/24/2016  . Acute left-sided weakness 03/23/2016  . Acute encephalopathy 03/23/2016  . Hyperkalemia 03/23/2016  . Anemia 03/20/2016  . Traumatic closed nondisplaced fracture of distal end of right radius 03/10/2016  . UTI (urinary tract infection) 02/22/2016  . Intracranial vascular stenosis 02/07/2016  . Dehydration 12/20/2015  . CVA, old, disturbances of vision 12/20/2015  . Abnormality of gait 11/27/2015  .  Citrobacter infection   . GERD (gastroesophageal reflux disease) 11/22/2015  . Malaise 11/22/2015  . Hyponatremia 11/22/2015  . Ecchymosis 11/15/2015  . Palpitations 11/02/2015  . Atrial tachycardia ([place]) 09/25/2015  . Acute kidney injury ([place]) 09/21/2015  . History of CVA (cerebrovascular accident)   . Disequilibrium   . Dizzy 09/18/2015  . 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 Diastasis recti 01/23/2015  . Pre-diabetes 03/07/2014  . Heart murmur 03/07/2014  . Polymyalgia rheumatica ([place]) 12/08/2013  . Disturbance in sleep behavior 06/28/2009  . Urinary frequency 06/28/2009  . Dyslipidemia 11/09/2008  . Essential hypertension 06/18/2007  . ALLERGIC RHINITIS 06/18/2007  . Ulcerative colitis ([place]) 06/08/2007  . OSTEOPENIA 06/08/2007  . MURMUR 06/08/2007    Past Surgical History:  Procedure Laterality Date  . BREAST LUMPECTOMY  1986   left; benign  . CATARACT EXTRACTION     Left eye   . COLONOSCOPY  2006  . Colonosopy  07/05/13   Dr. Deatra Ina  . DILATION AND CURETTAGE OF UTERUS  12/1999  . MELANOMA EXCISION  2007   left arm  . NECK SURGERY  1986   Benign growth on neck removed  . TONSILLECTOMY      OB History    No data available       Home Medications    Prior to Admission medications   Medication Sig Start Date End Date Taking? Authorizing Provider  acetaminophen (TYLENOL) 325 MG tablet Take 650 mg  by mouth every 6 (six) hours as needed.    Historical Provider, MD  amoxicillin (AMOXIL) 500 MG capsule Take 1 capsule (500 mg total) by mouth every 8 (eight) hours. 03/27/16   Geradine Girt, DO  apixaban (ELIQUIS) 5 MG TABS tablet Take 1 tablet (5 mg total) by mouth 2 (two) times daily. 03/27/16   Geradine Girt, DO  ARTIFICIAL TEAR SOLUTION OP Apply 1 drop to eye daily as needed (dry eyes).     Historical Provider, MD  aspirin EC 81 MG EC tablet Take 1 tablet (81 mg total) by mouth daily. 03/28/16   Geradine Girt, DO  atorvastatin (LIPITOR) 10 MG tablet Take 1 tablet  (10 mg total) by mouth daily at 6 PM. 09/26/15   Ripudeep Krystal Eaton, MD  Calcium Carbonate-Vit D-Min (CALCIUM 600+D PLUS MINERALS) 600-400 MG-UNIT TABS Take 2 tablets by mouth 2 (two) times daily.     Historical Provider, MD  Cranberry-Vitamin C-Inulin (UTI-STAT) LIQD Take 30 mLs by mouth 2 (two) times daily.    Historical Provider, MD  diltiazem (CARDIZEM) 90 MG tablet Take 1 tablet (90 mg total) by mouth every 12 (twelve) hours. 03/27/16   Geradine Girt, DO  lactose free nutrition (BOOST) LIQD Take 237 mLs by mouth every evening. Drink one can daily     Historical Provider, MD  loratadine (CLARITIN) 10 MG tablet Take 10 mg by mouth. Take one tablet daily    Historical Provider, MD  mesalamine (LIALDA) 1.2 g EC tablet take 2 tablets by mouth daily with BREAKFAST 04/26/15   [name], MD  metoprolol (LOPRESSOR) 50 MG tablet Take 0.5 tablets (25 mg total) by mouth 2 (two) times daily. 03/27/16   Geradine Girt, DO  mirabegron ER (MYRBETRIQ) 25 MG TB24 tablet Take 25 mg by mouth daily.    Historical Provider, MD  Multiple Vitamin (MULTIVITAMIN) tablet Take 1 tablet by mouth daily.      Historical Provider, MD  predniSONE (DELTASONE) 5 MG tablet Take 2 tablets (10 mg total) by mouth daily with breakfast. TWO TABLETs DAILY TO TREAT POLYMYALGIA RHEUMATICA 03/27/16   Geradine Girt, DO  ranitidine (ZANTAC) 150 MG tablet Take 150 mg by mouth at bedtime. Take one tablet at bedtime     Historical Provider, MD  sennosides-docusate sodium (SENOKOT-S) 8.6-50 MG tablet Take 1 tablet by mouth 3 (three) times daily. Take one tablet three times daily for constipation-hold for loose stools     Historical Provider, MD  zinc oxide (RA ZINC OXIDE) 20 % ointment Apply 1 application topically. Apply as needed    Historical Provider, MD    Family History Family History  Problem Relation Age of Onset  . Pancreatic cancer Other   . Other Mother     Coad/ Bronchiectosis  . Diabetes Paternal Grandmother   . Heart failure  Paternal Grandmother   . Cancer Paternal Aunt     unaware of primary site  . Coronary artery disease Neg Hx   . Colon cancer Neg Hx   . Rectal cancer Neg Hx   . Stomach cancer Neg Hx   . Heart attack Neg Hx     Social History Social History  Substance Use Topics  . Smoking status: Never Smoker  . Smokeless tobacco: Never Used  . Alcohol use Yes     Comment: occasional once a year     Allergies   Patient has no known allergies.   Review of Systems Review of Systems  10/14 systems reviewed and are negative other than those stated in the HPI   Physical Exam Updated Vital Signs BP 129/75 (BP Location: Left Arm)   Pulse 69   Resp 18   Ht 5\' 1"  (1.549 m)   Wt 110 lb (49.9 kg)   SpO2 99%   BMI 20.78 kg/m   Physical Exam Physical Exam  Nursing note and vitals reviewed. Constitutional: Well developed, well nourished, non-toxic, and in no acute distress Head: Normocephalic and atraumatic.  Mouth/Throat: Oropharynx is clear and moist.  Neck: Normal range of motion. Neck supple.  Cardiovascular: Normal rate and regular rhythm.   Pulmonary/Chest: Effort normal and breath sounds normal.  Abdominal: Soft. There is no tenderness. There is no rebound and no guarding.  Musculoskeletal: Normal range of motion.  Neurological: Alert, no facial droop, slowed speech, answering questions appropriately, left sided weakness Skin: Skin is warm and dry.  Psychiatric: Cooperative   ED Treatments / Results  Labs (all labs ordered are listed, but only abnormal results are displayed) Labs Reviewed  BASIC METABOLIC PANEL - Abnormal; Notable for the following:       Result Value   Sodium 130 (*)    Chloride 89 (*)    Glucose, Bld 154 (*)    Creatinine, Ser 1.09 (*)    Calcium 11.3 (*)    GFR calc non Af Amer 48 (*)    GFR calc Af Amer 56 (*)    All other components within normal limits  URINALYSIS, ROUTINE W REFLEX MICROSCOPIC - Abnormal; Notable for the following:    APPearance  CLOUDY (*)    Glucose, UA 50 (*)    Protein, ur 30 (*)    Bacteria, UA RARE (*)    Squamous Epithelial / LPF 0-5 (*)    All other components within normal limits  CBC WITH DIFFERENTIAL/PLATELET    EKG  EKG Interpretation None       Radiology Dg Chest 2 View  Result Date: 03/27/2016 CLINICAL DATA:  Decreased level of alertness today.  Recent stroke. EXAM: CHEST  2 VIEW COMPARISON:  03/23/2016 FINDINGS: Moderate hyperinflation and mild cardiomegaly, unchanged. The lungs are clear. The pulmonary vasculature is normal. There is no pleural effusion. Hilar, mediastinal and cardiac contours are unremarkable and unchanged. IMPRESSION: Hyperinflation and cardiomegaly.  No acute findings. Electronically Signed   By: Andreas Newport M.D.   On: 03/27/2016 23:01   Ct Head Wo Contrast  Result Date: 03/27/2016 CLINICAL DATA:  Recent stroke, less alert today since discharge EXAM: CT HEAD WITHOUT CONTRAST TECHNIQUE: Contiguous axial images were obtained from the base of the skull through the vertex without intravenous contrast. COMPARISON:  MRI 03/24/2016, 03/23/2016, CT 03/23/2016 FINDINGS: Brain: No hemorrhage is visualized. Hypodensity in the right frontal parietal cortex and subcortical white matter again visualized, and likely corresponds to previously noted diffusion abnormality. Previously noted subcentimeter restricted diffusion in the right occipital lobe white matter, right basal ganglia and left parietal lobe are not well appreciated by CT. There is no focal mass or midline shift. There is atrophy present as well as cerebellar atrophy. The ventricles are similar in size and morphology. Mild periventricular white matter hypodensity consistent with small vessel disease. Vascular: No hyperdense vessels.  Carotid artery calcifications. Skull: Fluid in the mastoid air cells.  No skull fracture Sinuses/Orbits: Mucosal thickening in the ethmoid sinuses. No acute orbital abnormality. Other: None IMPRESSION:  1. Hypodensity in the right frontal parietal cortex and subcortical white matter is felt to correspond to  known ischemic changes on recent MRI of the brain. The additional ischemic foci noted on the prior MRI are not well appreciated on today's CT. There is no hemorrhage identified. 2. Small mastoid effusions. Electronically Signed   By: Donavan Foil M.D.   On: 03/27/2016 23:26    Procedures Procedures (including critical care time)  Medications Ordered in ED Medications  sodium chloride 0.9 % bolus 500 mL (0 mLs Intravenous Stopped 03/28/16 0041)  metoprolol tartrate (LOPRESSOR) tablet 25 mg (25 mg Oral Given 03/28/16 0003)  diltiazem (CARDIZEM) tablet 90 mg (90 mg Oral Given 03/28/16 0007)  sodium chloride 0.9 % bolus 500 mL (500 mLs Intravenous New Bag/Given 03/28/16 0044)     Initial Impression / Assessment and Plan / ED Course  I have reviewed the triage vital signs and the nursing notes.  Pertinent labs & imaging results that were available during my care of the patient were reviewed by me and considered in my medical decision making (see chart for details).  Clinical Course    76 year old female with recent right MCA CVA who presents with hypertension. She is hypertensive with systolic blood pressures in the 170s to 190s on presentation. This is appropriate for her given her recent stroke, and per neurology notes blood pressures under 220/120 okay and especially within the first week of her stroke. Long-term blood pressure goals noted to be around [id number] to [id number] systolic. She also did not receive evening BP medications, and they were administered here w/ SbP 129-145 subsequently. Given headaches, CT head was performed, showing no hemorrhagic conversion or other acute processes. Basic blood work near her baseline. She did receive IV fluids. Brother thinks her mentation baseline now. Will discharge back to nursing facility.   Final Clinical Impressions(s) / ED Diagnoses   Final diagnoses:    Hypertension, unspecified type    New Prescriptions New Prescriptions   No medications on file     Forde Dandy, MD 03/28/16 0116

## 2016-03-27 NOTE — Consult Note (Signed)
   [facility] CM Inpatient Consult   03/27/2016  Tammy Beard [birth year] [id number]   Patient has had 3 hospitalizations in the past 6 months, listed in the [facility] ACO Registry.  Chart review reveals the patient, Tammy Beard is a 76 y.o. female with medical history significant for history of CVA, ulcerative colitis, polymyalgia rheumatica, hypertension, and GERD who presents to the emergency department from her nursing home for evaluation of altered mental status and acute left-sided weakness. Patient has had a series of recent illnesses, including UTI treated at her nursing facility, and fracture of the right radius. MRI revealed acute R MCA CVA.  Chart review reveals the patient is to return to a skilled facility, patient is noted to be from [facility] prior to admission per inpatient care management notes. Patient will have needs for nursing, social work, and medications at the facility.  No community care management needs noted at this time for [name] to follow.  For questions, please contact:  Natividad Brood, RN BSN [facility] Liaison  [phone] business mobile phone Toll free office [phone]

## 2016-03-27 NOTE — Progress Notes (Signed)
Pt discharge education and care plan completed. Report called off to nurse Debbie at [facility]. Pt IV and telemetry removed; [facility] with [facility] to transport to disposition. Pt waiting of [facility] to transport to disposition. Will closely monitor pt till pick up. P. Angelica Pou RN

## 2016-03-27 NOTE — ED Notes (Signed)
On way to CT 

## 2016-03-27 NOTE — Discharge Summary (Signed)
Physician Discharge Summary  LIVIANA RATHORE [id number] DOB: [birth year] DOA: 03/23/2016  PCP: Jeanmarie Hubert, MD  Admit date: 03/23/2016 Discharge date: 03/27/2016   Recommendations for Outpatient Follow-Up:   1. Would avoid depends as contributing to UTIs- good hygiene 2. Needs close follow up with Dr. Bobbye Charleston for UTI prevention- 1 week 3. Amoxicillin until 1/9- then resume trimethoprim 4. Continue eliquis for stroke prevention 5. Please encourage patient to eat and drink  BP goal 130-150 due to severe intracranial stenosis  Avoid hypotension  Monitor blood sugars- HgbA1C good but with recurrent UTIs, may need closer monitoring    Discharge Diagnosis:   Principal Problem:   Acute ischemic right MCA stroke ([place]) Active Problems:   Essential hypertension   Ulcerative colitis ([place])   Polymyalgia rheumatica (HCC)   History of CVA (cerebrovascular accident)   Acute kidney injury ([place])   GERD (gastroesophageal reflux disease)   Hyponatremia   UTI (urinary tract infection)   Traumatic closed nondisplaced fracture of distal end of right radius   Acute left-sided weakness   Acute encephalopathy   Hyperkalemia   CVA (cerebral vascular accident) ([place])   Paroxysmal atrial fibrillation ([place])   Discharge disposition:  SNF:  Discharge Condition: stable  Diet recommendation: DYS 2 diet  Wound care: None.   History of Present Illness:   Tammy Beard is a 76 y.o. female with medical history significant for history of CVA, ulcerative colitis, polymyalgia rheumatica, hypertension, and GERD who presents to the emergency department from her nursing home for evaluation of altered mental status and acute left-sided weakness. Patient has had a series of recent illnesses, including UTI treated at her nursing facility, and fracture of the right radius. She had, by report, otherwise been in her usual state until sometime yesterday afternoon when she was noted to be confused.  Confusion was also noted by nursing staff when the patient woke this morning at approximately 8 AM. Later in the day, at approximately 3 PM, the patient was noted to have a left-sided facial droop and was not moving her left side. Patient's brother reports that she has had a very similar presentation in the past with acute illness. There's been no recent head trauma reported, patient has not been noted to have vomiting or diarrhea, and no fevers have been recorded. Patient is unable to provide much history at this time, which is therefore obtained through discussion with the ED personnel, nursing facility personnel, reported the patient's brother, and review of the EMR.   Hospital Course by Problem:   Acute left-sided weakness; acute encephalopathy   - Neurology consulting and much appreciated  - tPA not given as time of onset unclear and infectious or toxic-metabolic etiology appeared more likely  - MRI is motion-degraded, but notable for multiple acute vs subacute infarcts in right MCA territory suggestive of embolic etiology  -found on tele to have a fib so TEE cancelled-- eliquis started will keep ASA 81mg  due to intracranial stenosis  -outpatient follow up with Dr. Erlinda Hong arranged   UTI- entercoccus fecalis -change to PO abx -wears depends at SNF -sent message to Dr. McDiarmid to notify patient's here at brother's request-- will ask nurse to schedule follow up ASAP  Acute kidney injury; hyperkalemia; hyponatremia - Suspect the AKI to be prerenal given her recent illnesses, confusion with poor oral intake  -improving-- needs to be encouraged to eat at SNF  Hypertension  - resume home meds BP goal 130-150 due to severe intracranial stenosis Avoid hypotension  Ulcerative colitis - Stable per recent PCP notes  - Resume mesalamine   Polymyalgia rheumatica  - Managed with prednisone 10 mg qD  Right radius fracture  - Immobilized in cast; neurovascularly intact distally  - per  previous    Medical Consultants:    Neuro   Discharge Exam:   Vitals:   03/27/16 0700 03/27/16 0955  BP: (!) 187/84 (!) 172/78  Pulse:  64  Resp:  19  Temp:  97.6 F (36.4 C)   Vitals:   03/27/16 0628 03/27/16 0646 03/27/16 0700 03/27/16 0955  BP: (!) 226/90  (!) 187/84 (!) 172/78  Pulse: 70   64  Resp: 20   19  Temp:  97.4 F (36.3 C)  97.6 F (36.4 C)  TempSrc:  Axillary  Axillary  SpO2: 99%   98%  Weight:  52.3 kg (115 lb 3.2 oz)    Height:        Gen:  NAD    The results of significant diagnostics from this hospitalization (including imaging, microbiology, ancillary and laboratory) are listed below for reference.     Procedures and Diagnostic Studies:   Dg Chest 2 View  Result Date: 03/23/2016 CLINICAL DATA:  Evaluate for pneumonia EXAM: CHEST  2 VIEW COMPARISON:  11/26/2015 FINDINGS: Normal mediastinum and cardiac silhouette. Normal pulmonary vasculature. No evidence of effusion, infiltrate, or pneumothorax. No acute bony abnormality. IMPRESSION: No acute cardiopulmonary process. Electronically Signed   By: Suzy Bouchard M.D.   On: 03/23/2016 19:16   Mr Virgel Paling [id number] Contrast  Result Date: 03/24/2016 CLINICAL DATA:  76 year old female with acute or subacute right MCA territory infarcts discovered on brain MRI performed for confusion and left side weakness. History of Severe intracranial stenoses. Initial encounter. EXAM: MRA HEAD WITHOUT CONTRAST TECHNIQUE: Angiographic images of the Circle of Willis were obtained using MRA technique without intravenous contrast. COMPARISON:  Brain MRI 03/23/2016. CTA head and neck 09/24/2015. Intracranial MRA 09/22/2015. FINDINGS: The previous MRA was motion degraded. The findings will be compared to the prior CTA. Antegrade flow in the posterior circulation with stable appearing distal vertebral arteries and basilar artery. No vertebrobasilar stenosis. PCA origins are within normal limits. Bilateral PCA branches are within  normal limits. Preserved antegrade flow in both ICA siphons. No right siphon stenosis. Severe supraclinoid left ICA siphon stenosis appears stable from the prior CTA (series 406, image 6) and the left ICA terminus remains patent. Mild irregularity and stenosis at the left ACA origin appears stable. MCA and right ACA origins are stable and within normal limits. Visualized bilateral ACA branches appear stable, mild irregularity and stenosis on the left. Visualized left MCA branches appear stable with mild irregularity. The left MCA bifurcation remains patent. Severe distal right M1 stenosis appears stable (series 406, image 9). Right MCA bifurcation remains patent. Distal MCA flow signal appears fairly stable today, although the right MCA branches do appear somewhat more regular. IMPRESSION: Negative for emergent large vessel occlusion. Widespread anterior circulation atherosclerosis, with relative sparing of the posterior circulation. No significant change suspected in severe anterior circulation stenoses since The CTA on 09/24/2015 (please see also that report): Severe stenoses of the left ICA supraclinoid segment and right MCA distal M1 segment. Electronically Signed   By: Genevie Ann M.D.   On: 03/24/2016 10:03   Mr Brain Wo Contrast  Result Date: 03/23/2016 CLINICAL DATA:  Dysphasia, LEFT-sided weakness beginning yesterday, worsening today. Confusion and lethargy. Hypertensive. Recent infection. History of hypertension, hyperlipidemia, polio. EXAM: MRI HEAD WITHOUT  CONTRAST TECHNIQUE: Multiplanar, multiecho pulse sequences of the brain and surrounding structures were obtained without intravenous contrast. COMPARISON:  CT HEAD March 23, 2016 at 6 p.m. and MRI of the head September 22, 2015 FINDINGS: Sequences are moderately or severely motion degraded. BRAIN: Subcentimeter reduced diffusion RIGHT occipital lobe periventricular white matter, RIGHT basal ganglia. Patchy reduced diffusion RIGHT frontoparietal lobes,  punctate reduced diffusion LEFT parietal lobe. Due to motion, limited assessment on ADC map. No susceptibility artifact to suggest large hemorrhage, limited assessment due to patient motion. Moderate ventriculomegaly on the basis of global parenchymal brain volume loss. Patchy supratentorial white matter FLAIR T2 hyperintensities. No abnormal extra-axial fluid collections. VASCULAR: Major intracranial vascular flow voids present at skull base. SKULL AND UPPER CERVICAL SPINE: Probable partially empty sella. No suspicious calvarial bone marrow signal. Craniocervical junction maintained. SINUSES/ORBITS: Bilateral mastoid effusions. Status post bilateral ocular lens implants. OTHER: None. IMPRESSION: Moderate to severely motion degraded examination. Acute versus subacute multi territorial infarcts including RIGHT MCA territory, most consistent with embolic phenomena. Electronically Signed   By: Elon Alas M.D.   On: 03/23/2016 23:42   Ct Head Code Stroke W/o Cm  Result Date: 03/23/2016 CLINICAL DATA:  Code stroke. Right-sided weakness. Last seen normal 3 hours ago. EXAM: CT HEAD WITHOUT CONTRAST TECHNIQUE: Contiguous axial images were obtained from the base of the skull through the vertex without intravenous contrast. COMPARISON:  12/21/2015 and multiple previous.  MRI 09/21/2015. FINDINGS: Brain: There is old infarction in the right parietal cortical and subcortical brain. No sign of acute infarction, mass lesion, hemorrhage, hydrocephalus or extra-axial collection. Mild small vessel changes of the white matter. Vascular: There is atherosclerotic calcification of the major vessels at the base of the brain. Skull: Negative Sinuses/Orbits: Sinuses are clear. Small mastoid effusions. Orbits negative. Other: None ASPECTS ([place] Stroke Program Early CT Score) - Ganglionic level infarction (caudate, lentiform nuclei, internal capsule, insula, M1-M3 cortex): 6 - Supraganglionic infarction (M4-M6 cortex): 3 Total  score (0-10 with 10 being normal): 9 IMPRESSION: 1. No acute infarction suspected by CT. Old infarction in the right posterior parietal cortical and subcortical brain. 2. ASPECTS is 9, though the abnormality in M 6 is likely chronic. These results were paged by telephone at the time of interpretation on 03/23/2016 at 6:04 pm to Dr. Cheral Marker, awaiting response. Electronically Signed   By: Nelson Chimes M.D.   On: 03/23/2016 18:06     Labs:   Basic Metabolic Panel:  Recent Labs Lab 03/23/16 1750 03/23/16 1800  03/24/16 1448 03/25/16 0508 03/26/16 0443  NA 131* 130*  --  133* 133* 133*  K 5.3* 5.4*  < > 3.8 4.1 3.6  CL 92* 93*  --  96* 99* 94*  CO2 27  --   --  24 25 27   GLUCOSE 186* 187*  --  230* 146* 171*  BUN 38* 56*  --  30* 26* 18  CREATININE 1.70* 1.80*  --  1.58* 1.26* 1.11*  CALCIUM 10.8*  --   --  9.0 9.1 9.4  < > = values in this interval not displayed. GFR Estimated Creatinine Clearance: 32.5 mL/min (by C-G formula based on SCr of 1.11 mg/dL (H)). Liver Function Tests:  Recent Labs Lab 03/23/16 1750  AST 28  ALT 18  ALKPHOS 75  BILITOT 0.8  PROT 7.9  ALBUMIN 3.7   No results for input(s): LIPASE, AMYLASE in the last 168 hours.  Recent Labs Lab 03/24/16 0027  AMMONIA 42*   Coagulation profile  Recent  Labs Lab 03/23/16 1750  INR 0.95    CBC:  Recent Labs Lab 03/23/16 1750 03/23/16 1800 03/24/16 1448 03/25/16 0508 03/26/16 0443  WBC 10.4  --  11.1* 6.0 6.3  NEUTROABS 8.6*  --   --   --   --   HGB 14.1 14.3 12.8 11.7* 13.0  HCT 42.0 42.0 38.4 35.6* 38.3  MCV 93.3  --  93.2 92.5 91.4  PLT 187  --  238 165 182   Cardiac Enzymes: No results for input(s): CKTOTAL, CKMB, CKMBINDEX, TROPONINI in the last 168 hours. BNP: Invalid input(s): POCBNP CBG:  Recent Labs Lab 03/26/16 0612 03/26/16 1131 03/26/16 1714 03/26/16 2143 03/27/16 0644  GLUCAP 157* 145* 113* 256* 128*   D-Dimer No results for input(s): DDIMER in the last 72 hours. Hgb  A1c No results for input(s): HGBA1C in the last 72 hours. Lipid Profile No results for input(s): CHOL, HDL, LDLCALC, TRIG, CHOLHDL, LDLDIRECT in the last 72 hours. Thyroid function studies No results for input(s): TSH, T4TOTAL, T3FREE, THYROIDAB in the last 72 hours.  Invalid input(s): FREET3 Anemia work up No results for input(s): VITAMINB12, FOLATE, FERRITIN, TIBC, IRON, RETICCTPCT in the last 72 hours. Microbiology Recent Results (from the past 240 hour(s))  Urine culture     Status: Abnormal   Collection Time: 03/23/16  7:31 PM  Result Value Ref Range Status   Specimen Description URINE, CATHETERIZED  Final   Special Requests NONE  Final   Culture >=100,000 COLONIES/mL ENTEROCOCCUS FAECALIS (A)  Final   Report Status 03/26/2016 FINAL  Final   Organism ID, Bacteria ENTEROCOCCUS FAECALIS (A)  Final      Susceptibility   Enterococcus faecalis - MIC*    AMPICILLIN <=2 SENSITIVE Sensitive     LEVOFLOXACIN >=8 RESISTANT Resistant     NITROFURANTOIN <=16 SENSITIVE Sensitive     VANCOMYCIN 1 SENSITIVE Sensitive     * >=100,000 COLONIES/mL ENTEROCOCCUS FAECALIS  MRSA PCR Screening     Status: None   Collection Time: 03/24/16  6:27 AM  Result Value Ref Range Status   MRSA by PCR NEGATIVE NEGATIVE Final    Comment:        The GeneXpert MRSA Assay (FDA approved for NASAL specimens only), is one component of a comprehensive MRSA colonization surveillance program. It is not intended to diagnose MRSA infection nor to guide or monitor treatment for MRSA infections.      Discharge Instructions:   Discharge Instructions    Discharge instructions    Complete by:  As directed    Would avoid depends as contributing to UTIs- good hygiene Needs close follow up with Dr. Bobbye Charleston for UTI prevention- 1 week Amoxicillin until 1/9- then resume trimethoprim Continue eliquis for stroke prevention   Increase activity slowly    Complete by:  As directed      Allergies as of 03/27/2016     No Known Allergies     Medication List    STOP taking these medications   benazepril-hydrochlorthiazide 20-12.5 MG tablet Commonly known as:  LOTENSIN HCT   dimenhyDRINATE 50 MG tablet Commonly known as:  DRAMAMINE   dipyridamole-aspirin 200-25 MG 12hr capsule Commonly known as:  AGGRENOX   trimethoprim 100 MG tablet Commonly known as:  TRIMPEX     TAKE these medications   acetaminophen 325 MG tablet Commonly known as:  TYLENOL Take 650 mg by mouth every 6 (six) hours as needed.   amoxicillin 500 MG capsule Commonly known as:  AMOXIL Take 1  capsule (500 mg total) by mouth every 8 (eight) hours.   apixaban 5 MG Tabs tablet Commonly known as:  ELIQUIS Take 1 tablet (5 mg total) by mouth 2 (two) times daily.   ARTIFICIAL TEAR SOLUTION OP Apply 1 drop to eye daily as needed (dry eyes).   aspirin 81 MG EC tablet Take 1 tablet (81 mg total) by mouth daily. Start taking on:  03/28/2016   atorvastatin 10 MG tablet Commonly known as:  LIPITOR Take 1 tablet (10 mg total) by mouth daily at 6 PM.   CALCIUM 600+D PLUS MINERALS 600-400 MG-UNIT Tabs Take 2 tablets by mouth 2 (two) times daily.   diltiazem 90 MG tablet Commonly known as:  CARDIZEM Take 1 tablet (90 mg total) by mouth every 12 (twelve) hours. What changed:  medication strength  how much to take   lactose free nutrition Liqd Take 237 mLs by mouth every evening. Drink one can daily   loratadine 10 MG tablet Commonly known as:  CLARITIN Take 10 mg by mouth. Take one tablet daily   mesalamine 1.2 g EC tablet Commonly known as:  LIALDA take 2 tablets by mouth daily with BREAKFAST   metoprolol 50 MG tablet Commonly known as:  LOPRESSOR Take 0.5 tablets (25 mg total) by mouth 2 (two) times daily.   multivitamin tablet Take 1 tablet by mouth daily.   MYRBETRIQ 25 MG Tb24 tablet Generic drug:  mirabegron ER Take 25 mg by mouth daily.   predniSONE 5 MG tablet Commonly known as:  DELTASONE Take 2  tablets (10 mg total) by mouth daily with breakfast. TWO TABLETs DAILY TO TREAT POLYMYALGIA RHEUMATICA What changed:  how much to take  how to take this  when to take this   RA ZINC OXIDE 20 % ointment Generic drug:  zinc oxide Apply 1 application topically. Apply as needed   ranitidine 150 MG tablet Commonly known as:  ZANTAC Take 150 mg by mouth at bedtime. Take one tablet at bedtime   sennosides-docusate sodium 8.6-50 MG tablet Commonly known as:  SENOKOT-S Take 1 tablet by mouth 3 (three) times daily. Take one tablet three times daily for constipation-hold for loose stools   UTI-STAT Liqd Take 30 mLs by mouth 2 (two) times daily.      Follow-up Information    Xu,Jindong, MD. Go on 06/09/2016.   Specialty:  Neurology Contact information: [address] [place] [address] [phone]        MACDIARMID,SCOTT A, MD Follow up in 1 week(s).   Specialty:  Urology Contact information: [place] [address] [phone]            Time coordinating discharge: 35 min  Signed:  Sharnetta Gielow U Cyniah Gossard   [facility] 03/27/2016, 10:58 AM

## 2016-03-27 NOTE — ED Triage Notes (Signed)
Per EMS, patient was discharged from hospital with diagnosis of CVA.  Hx of hypertension and hospital staff got blood pressure down within protocol limits. Once patient arrived back at nursing home, patient's blood pressure increased and was sent back to hospital.  BP 210/100, 78 HR irregular, RR 16, and CBG 161.

## 2016-03-27 NOTE — Progress Notes (Signed)
Pt picked up by [facility] to be transported off unit to disposition. Pt transported off unit via stretcher along with her both hearing aids in with batteries; eye glasses, clothing and watch. Delia Heady RN

## 2016-03-27 NOTE — Clinical Social Work Note (Signed)
Patient medically stable for discharge back to [facility] facility to continue rehab. Talked with patient and her brother Abbygale Ternes and both agreeable to patient returning to healthcare facility to continue rehab. Discharge clinicals transmitted to facility and Ms. Leggio will be transported by ambulance to facility.  Jaeden [place]bay Givens, MSW, LCSW Licensed Clinical Social Worker [place] [phone]

## 2016-03-27 NOTE — NC FL2 (Signed)
[place] LEVEL OF CARE SCREENING TOOL     IDENTIFICATION  Patient Name: Tammy Beard Birthdate: [birth year] Sex: female Admission Date (Current Location): 03/23/2016  [facility] and [place] Number:  Herbalist and Address:  [facility], [place] [address] [place], [place] [address]      Provider Number: [id number]  Attending Physician Name and Address:  Geradine Girt, DO  Relative Name and Phone Number:  Orengo,William (Pete)-Brother; [phone] (h), [phone] (mobile); Corprew,Kathy-Sister, [phone] (h)      Current Level of Care: Hospital Recommended Level of Care: [place] Prior Approval Number:    Date Approved/Denied:   PASRR Number: [id number] (Eff. 09/23/15)  Discharge Plan: SNF - [facility] [place]    Current Diagnoses: Patient Active Problem List   Diagnosis Date Noted  . Paroxysmal atrial fibrillation (HCC)   . Acute ischemic right MCA stroke ([place]) 03/24/2016  . CVA (cerebral vascular accident) ([place]) 03/24/2016  . Acute left-sided weakness 03/23/2016  . Acute encephalopathy 03/23/2016  . Hyperkalemia 03/23/2016  . Anemia 03/20/2016  . Traumatic closed nondisplaced fracture of distal end of right radius 03/10/2016  . UTI (urinary tract infection) 02/22/2016  . Intracranial vascular stenosis 02/07/2016  . Dehydration 12/20/2015  . CVA, old, disturbances of vision 12/20/2015  . Abnormality of gait 11/27/2015  . Citrobacter infection   . GERD (gastroesophageal reflux disease) 11/22/2015  . Malaise 11/22/2015  . Hyponatremia 11/22/2015  . Ecchymosis 11/15/2015  . Palpitations 11/02/2015  . Atrial tachycardia ([place]) 09/25/2015  . Acute kidney injury ([place]) 09/21/2015  . History of CVA (cerebrovascular accident)   . Disequilibrium   . Dizzy 09/18/2015  . 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 Diastasis recti 01/23/2015  . Pre-diabetes 03/07/2014  . Heart murmur 03/07/2014  . Polymyalgia rheumatica  ([place]) 12/08/2013  . Disturbance in sleep behavior 06/28/2009  . Urinary frequency 06/28/2009  . Dyslipidemia 11/09/2008  . Essential hypertension 06/18/2007  . ALLERGIC RHINITIS 06/18/2007  . Ulcerative colitis ([place]) 06/08/2007  . OSTEOPENIA 06/08/2007  . MURMUR 06/08/2007    Orientation RESPIRATION BLADDER Height & Weight     Self, Place  Normal Continent Weight: 115 lb 3.2 oz (52.3 kg) Height:  5\' 1"  (154.9 cm)  BEHAVIORAL SYMPTOMS/MOOD NEUROLOGICAL BOWEL NUTRITION STATUS      Continent Diet (DYS 2)  AMBULATORY STATUS COMMUNICATION OF NEEDS Skin   Total Care (Unable to ambulate with safety per PT) Verbally Skin abrasions (Arm)                       Personal Care Assistance Level of Assistance  Bathing, Feeding, Dressing Bathing Assistance: Maximum assistance Feeding assistance: Maximum assistance Dressing Assistance: Maximum assistance     Functional Limitations Info  Sight, Hearing, Speech Sight Info: Impaired Hearing Info: Impaired Speech Info: Adequate    SPECIAL CARE FACTORS FREQUENCY  PT (By licensed PT), OT (By licensed OT), Speech therapy     PT Frequency: Evaluated 1/1 and a minimum of 3X per week therapy recommended OT Frequency: Evaluated 1/1 and a minimum of 2X per week therapy recommended     Speech Therapy Frequency: Evaluated 1/1 and DYS 2 recommended      Contractures Contractures Info: Not present    Additional Factors Info  Code Status, Allergies, Insulin Sliding Scale Code Status Info: DNR Allergies Info: No known allergies   Insulin Sliding Scale Info: 0-5 Units daily at bedtime; 0-15 Units 3X daily with meals  Current Medications (03/27/2016):  This is the current hospital active medication list Current Facility-Administered Medications  Medication Dose Route Frequency Provider Last Rate Last Dose  . acetaminophen (TYLENOL) tablet 650 mg  650 mg Oral Q4H PRN Vianne Bulls, MD   650 mg at 03/27/16 [id number]   Or  . acetaminophen  (TYLENOL) solution 650 mg  650 mg Per Tube Q4H PRN Vianne Bulls, MD   650 mg at 03/24/16 1826   Or  . acetaminophen (TYLENOL) suppository 650 mg  650 mg Rectal Q4H PRN Vianne Bulls, MD      . amoxicillin (AMOXIL) capsule 500 mg  500 mg Oral Q8H Jessica U Vann, DO   500 mg at 03/27/16 0620  . apixaban (ELIQUIS) tablet 5 mg  5 mg Oral BID Donzetta Starch, NP   5 mg at 03/27/16 0903  . aspirin EC tablet 81 mg  81 mg Oral Daily Geradine Girt, DO   81 mg at 03/27/16 [id number]  . atorvastatin (LIPITOR) tablet 10 mg  10 mg Oral q1800 Vianne Bulls, MD   10 mg at 03/26/16 1711  . calcium-vitamin D (OSCAL WITH D) 500-200 MG-UNIT per tablet 2 tablet  2 tablet Oral BID WC Vianne Bulls, MD   2 tablet at 03/27/16 0855  . diltiazem (CARDIZEM) tablet 120 mg  120 mg Oral Q12H Jessica U Vann, DO      . famotidine (PEPCID) tablet 20 mg  20 mg Oral Daily Geradine Girt, DO   20 mg at 03/27/16 0903  . hydrALAZINE (APRESOLINE) injection 5 mg  5 mg Intravenous Q4H PRN Gardiner Barefoot, NP   5 mg at 03/27/16 [id number]  . insulin aspart (novoLOG) injection 0-15 Units  0-15 Units Subcutaneous TID WC Geradine Girt, DO   2 Units at 03/27/16 0725  . insulin aspart (novoLOG) injection 0-5 Units  0-5 Units Subcutaneous QHS Geradine Girt, DO   3 Units at 03/26/16 2233  . lactose free nutrition (Boost) liquid 237 mL  237 mL Oral QPM Ilene Qua Opyd, MD   237 mL at 03/26/16 1715  . loratadine (CLARITIN) tablet 10 mg  10 mg Oral Daily Vianne Bulls, MD   10 mg at 03/27/16 0903  . mesalamine (LIALDA) EC tablet 2.4 g  2.4 g Oral Q breakfast Geradine Girt, DO   2.4 g at 03/27/16 0854  . mirabegron ER (MYRBETRIQ) tablet 25 mg  25 mg Oral Daily Geradine Girt, DO   25 mg at 03/27/16 0904  . predniSONE (DELTASONE) tablet 5 mg  5 mg Oral BID WC Geradine Girt, DO   5 mg at 03/27/16 0854  . senna-docusate (Senokot-S) tablet 1 tablet  1 tablet Oral TID Vianne Bulls, MD   1 tablet at 03/27/16 0905     Discharge Medications: Please  see discharge summary for a list of discharge medications.  Relevant Imaging Results:  Relevant Lab Results:   Additional Information [phone].  Sable Feil, LCSW

## 2016-03-28 ENCOUNTER — Encounter: Payer: Self-pay | Admitting: Nurse Practitioner

## 2016-03-28 ENCOUNTER — Non-Acute Institutional Stay (SKILLED_NURSING_FACILITY): Payer: Medicare Other | Admitting: Nurse Practitioner

## 2016-03-28 ENCOUNTER — Telehealth: Payer: Self-pay

## 2016-03-28 DIAGNOSIS — R350 Frequency of micturition: Secondary | ICD-10-CM | POA: Diagnosis not present

## 2016-03-28 DIAGNOSIS — N3946 Mixed incontinence: Secondary | ICD-10-CM | POA: Diagnosis not present

## 2016-03-28 DIAGNOSIS — M353 Polymyalgia rheumatica: Secondary | ICD-10-CM

## 2016-03-28 DIAGNOSIS — M25511 Pain in right shoulder: Secondary | ICD-10-CM | POA: Diagnosis not present

## 2016-03-28 DIAGNOSIS — A498 Other bacterial infections of unspecified site: Secondary | ICD-10-CM | POA: Diagnosis not present

## 2016-03-28 DIAGNOSIS — R2681 Unsteadiness on feet: Secondary | ICD-10-CM | POA: Diagnosis not present

## 2016-03-28 DIAGNOSIS — R7303 Prediabetes: Secondary | ICD-10-CM | POA: Diagnosis not present

## 2016-03-28 DIAGNOSIS — G8194 Hemiplegia, unspecified affecting left nondominant side: Secondary | ICD-10-CM | POA: Diagnosis not present

## 2016-03-28 DIAGNOSIS — R1312 Dysphagia, oropharyngeal phase: Secondary | ICD-10-CM | POA: Diagnosis not present

## 2016-03-28 DIAGNOSIS — Z7901 Long term (current) use of anticoagulants: Secondary | ICD-10-CM | POA: Diagnosis not present

## 2016-03-28 DIAGNOSIS — M6281 Muscle weakness (generalized): Secondary | ICD-10-CM | POA: Diagnosis not present

## 2016-03-28 DIAGNOSIS — I679 Cerebrovascular disease, unspecified: Secondary | ICD-10-CM | POA: Diagnosis not present

## 2016-03-28 DIAGNOSIS — I69398 Other sequelae of cerebral infarction: Secondary | ICD-10-CM | POA: Diagnosis not present

## 2016-03-28 DIAGNOSIS — R41841 Cognitive communication deficit: Secondary | ICD-10-CM | POA: Diagnosis not present

## 2016-03-28 DIAGNOSIS — K219 Gastro-esophageal reflux disease without esophagitis: Secondary | ICD-10-CM

## 2016-03-28 DIAGNOSIS — Z8673 Personal history of transient ischemic attack (TIA), and cerebral infarction without residual deficits: Secondary | ICD-10-CM

## 2016-03-28 DIAGNOSIS — R531 Weakness: Secondary | ICD-10-CM | POA: Diagnosis not present

## 2016-03-28 DIAGNOSIS — N3000 Acute cystitis without hematuria: Secondary | ICD-10-CM | POA: Diagnosis not present

## 2016-03-28 DIAGNOSIS — R1319 Other dysphagia: Secondary | ICD-10-CM | POA: Diagnosis not present

## 2016-03-28 DIAGNOSIS — I69891 Dysphagia following other cerebrovascular disease: Secondary | ICD-10-CM | POA: Diagnosis not present

## 2016-03-28 DIAGNOSIS — R269 Unspecified abnormalities of gait and mobility: Secondary | ICD-10-CM | POA: Diagnosis not present

## 2016-03-28 DIAGNOSIS — R55 Syncope and collapse: Secondary | ICD-10-CM | POA: Diagnosis not present

## 2016-03-28 DIAGNOSIS — S52501D Unspecified fracture of the lower end of right radius, subsequent encounter for closed fracture with routine healing: Secondary | ICD-10-CM | POA: Diagnosis not present

## 2016-03-28 DIAGNOSIS — I480 Paroxysmal atrial fibrillation: Secondary | ICD-10-CM

## 2016-03-28 DIAGNOSIS — I69391 Dysphagia following cerebral infarction: Secondary | ICD-10-CM | POA: Diagnosis not present

## 2016-03-28 DIAGNOSIS — Z79899 Other long term (current) drug therapy: Secondary | ICD-10-CM | POA: Diagnosis not present

## 2016-03-28 DIAGNOSIS — I471 Supraventricular tachycardia: Secondary | ICD-10-CM | POA: Diagnosis not present

## 2016-03-28 DIAGNOSIS — R278 Other lack of coordination: Secondary | ICD-10-CM | POA: Diagnosis not present

## 2016-03-28 DIAGNOSIS — I63511 Cerebral infarction due to unspecified occlusion or stenosis of right middle cerebral artery: Secondary | ICD-10-CM

## 2016-03-28 DIAGNOSIS — F29 Unspecified psychosis not due to a substance or known physiological condition: Secondary | ICD-10-CM | POA: Diagnosis not present

## 2016-03-28 DIAGNOSIS — N179 Acute kidney failure, unspecified: Secondary | ICD-10-CM | POA: Diagnosis not present

## 2016-03-28 DIAGNOSIS — I10 Essential (primary) hypertension: Secondary | ICD-10-CM

## 2016-03-28 DIAGNOSIS — I639 Cerebral infarction, unspecified: Secondary | ICD-10-CM | POA: Diagnosis not present

## 2016-03-28 DIAGNOSIS — K5100 Ulcerative (chronic) pancolitis without complications: Secondary | ICD-10-CM

## 2016-03-28 DIAGNOSIS — I63411 Cerebral infarction due to embolism of right middle cerebral artery: Secondary | ICD-10-CM | POA: Diagnosis not present

## 2016-03-28 DIAGNOSIS — R293 Abnormal posture: Secondary | ICD-10-CM | POA: Diagnosis not present

## 2016-03-28 DIAGNOSIS — H539 Unspecified visual disturbance: Secondary | ICD-10-CM

## 2016-03-28 DIAGNOSIS — R1314 Dysphagia, pharyngoesophageal phase: Secondary | ICD-10-CM | POA: Diagnosis not present

## 2016-03-28 DIAGNOSIS — E785 Hyperlipidemia, unspecified: Secondary | ICD-10-CM | POA: Diagnosis not present

## 2016-03-28 DIAGNOSIS — N390 Urinary tract infection, site not specified: Secondary | ICD-10-CM | POA: Diagnosis not present

## 2016-03-28 DIAGNOSIS — R42 Dizziness and giddiness: Secondary | ICD-10-CM | POA: Diagnosis not present

## 2016-03-28 DIAGNOSIS — R262 Difficulty in walking, not elsewhere classified: Secondary | ICD-10-CM | POA: Diagnosis not present

## 2016-03-28 DIAGNOSIS — Z09 Encounter for follow-up examination after completed treatment for conditions other than malignant neoplasm: Secondary | ICD-10-CM | POA: Diagnosis not present

## 2016-03-28 LAB — URINALYSIS, ROUTINE W REFLEX MICROSCOPIC
Bilirubin Urine: NEGATIVE
Glucose, UA: 50 mg/dL — AB
HGB URINE DIPSTICK: NEGATIVE
Ketones, ur: NEGATIVE mg/dL
Leukocytes, UA: NEGATIVE
Nitrite: NEGATIVE
PH: 7 (ref 5.0–8.0)
Protein, ur: 30 mg/dL — AB
SPECIFIC GRAVITY, URINE: 1.009 (ref 1.005–1.030)

## 2016-03-28 MED ORDER — SODIUM CHLORIDE 0.9 % IV BOLUS (SEPSIS)
500.0000 mL | Freq: Once | INTRAVENOUS | Status: AC
  Administered 2016-03-28: 500 mL via INTRAVENOUS

## 2016-03-28 NOTE — Assessment & Plan Note (Addendum)
Elevated, 180/96 today, ED eval for elevated BP last night, Continue Metoprolol 50mg , Diltiazem 90mg  bid, staff stated the patient pocketing pills, will add Clonidine patch, hold Metoprolol and Diltiazem if Bp<130/90

## 2016-03-28 NOTE — Assessment & Plan Note (Signed)
MRI; notable for multiple acute vs subacute infarcts in right MCA territory suggestive of embolic etiology  -found on tele to have a fib so TEE cancelled-- eliquis started will keep ASA 81mg  due to intracranial stenosis

## 2016-03-28 NOTE — Telephone Encounter (Signed)
Possible re-admission to facility. This is a patient you were seeing at [facility] F/U is needed if patient was re-admitted to facility upon discharge. Hospital discharge from [facility] on 03/28/2016.

## 2016-03-28 NOTE — Assessment & Plan Note (Signed)
Managed with Prednisone 5mg daily 

## 2016-03-28 NOTE — Assessment & Plan Note (Signed)
Left-sided colitis, in remission, on lialda, diagnosed 2006 Continue Lialda.

## 2016-03-28 NOTE — ED Notes (Addendum)
Pt brother took hearing aids home. Will bring to facility tomorrow

## 2016-03-28 NOTE — Discharge Instructions (Signed)
Continue to take medications as prescribed. Blood pressure is reassuring in ED and improved after receiving night time blood pressure medications.  It is okay for blood pressure to be <220/120 after stroke for the first week. This is permissive hypertension in setting of stroke. It will eventually normalize, and er goal blood pressures should be 130-150 SBP afterwards. AVOID LOW BLOOD PRESSURES for her because it can affect her brain perfusion.  Return for worsening symptoms, including confusion, intractable vomiting, chest pain, or any other symptoms concerning to you.

## 2016-03-28 NOTE — Progress Notes (Signed)
Location:  [place] Room Number: 73 Place of Service:  SNF (31) Provider:  Mast, Manxie  NP  Jeanmarie Hubert, MD  Patient Care Team: [name], MD as PCP - General (Internal Medicine) [name], MD as Consulting Physician (Cardiology) [name], MD as Consulting Physician (Ophthalmology) [name], MD as Consulting Physician (Gastroenterology) [name], MD as Consulting Physician (Dermatology)  Extended Emergency Contact Information Primary Emergency Contact: [name]) Address: [place], [place] [address] [name] of [place] [employer] Phone: [phone] Mobile Phone: [phone] Relation: Brother Secondary Emergency Contact: [name] [place] of [place] Phone: [phone] Relation: Sister  Code Status:  DNR Goals of care: Advanced Directive information Advanced Directives 03/28/2016  Does Patient Have a Medical Advance Directive? Yes  Type of Advance Directive [place]  Does patient want to make changes to medical advance directive? No - Patient declined  Copy of [place] in Chart? Yes  Pre-existing out of facility DNR order (yellow form or pink MOST form) -     Chief Complaint  Patient presents with  . Acute Visit    Elevated BP      HPI:  Pt is a 76 y.o. female seen today for an acute visit for elevated BP >180/90, ED eval 03/27/16 Metoprolol 25mg  Diltiazem 90mg  x1. CT head ischemic changes.  CXR showed no acute findings.    Hospitalized 03/23/16 to 03/27/16 UTI, will complete Amoxicillin 04/02/15 then resume Trimethoprim, f/u Urology. MRI; notable for multiple acute vs subacute infarcts in right MCA territory suggestive of embolic etiology  -found on tele to have a fib so TEE cancelled-- eliquis started will keep ASA 81mg  due to intracranial stenosis     Recent Hx of the right distal radius fx, saw Ortho 03/10/16, short arm  cast x 4 weeks, observed swelling fingers, but the patient denied pain, tingling, or numbness in the fingers, she is able to wiggle her fingers.     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 On ASA Eliquis.   Hx pf HTN, on Metoprolol 50mg , Diltiazem 90mg  bid. Urinary frequency, no efficacy since [name] 25mg  daily. Stable ulcerative colitis on Lialda.   Past Medical History:  Diagnosis Date  . Colitis   . Ecchymosis 11/15/2015  . Fracture of right radius 02/2016  . GERD (gastroesophageal reflux disease)   . Heart murmur   . Hyperlipidemia   . Hypertension   . Osteopenia    s/p 7 yrs Fosamax  . Polio   . Polymyalgia ([place])   . Rheumatic fever   . Sleep disorder   . Stroke ([place])   . Ulcerative colitis    Past Surgical History:  Procedure Laterality Date  . BREAST LUMPECTOMY  1986   left; benign  . CATARACT EXTRACTION     Left eye   . COLONOSCOPY  2006  . Colonosopy  07/05/13   Dr. Deatra Ina  . DILATION AND CURETTAGE OF UTERUS  12/1999  . MELANOMA EXCISION  2007   left arm  . NECK SURGERY  1986   Benign growth on neck removed  . TONSILLECTOMY      No Known Allergies  Allergies as of 03/28/2016   No Known Allergies     Medication List       Accurate as of 03/28/16  3:15 PM. Always use your most recent med list.  acetaminophen 325 MG tablet Commonly known as:  TYLENOL Take 650 mg by mouth every 6 (six) hours as needed.   amoxicillin 500 MG capsule Commonly known as:  AMOXIL Take 1 capsule (500 mg total) by mouth every 8 (eight) hours.   apixaban 5 MG Tabs tablet Commonly known as:  ELIQUIS Take 1 tablet (5 mg total) by mouth 2 (two) times daily.   ARTIFICIAL TEAR SOLUTION OP Apply 1 drop to eye daily as needed (dry eyes).   aspirin 81 MG EC tablet Take 1 tablet (81 mg total) by mouth daily.   atorvastatin 10 MG tablet Commonly known as:  LIPITOR Take 1 tablet (10 mg total) by mouth daily at 6 PM.   CALCIUM 600+D PLUS MINERALS 600-400 MG-UNIT Tabs Take  2 tablets by mouth 2 (two) times daily.   diltiazem 90 MG tablet Commonly known as:  CARDIZEM Take 1 tablet (90 mg total) by mouth every 12 (twelve) hours.   lactose free nutrition Liqd Take 237 mLs by mouth every evening. Drink one can daily   loratadine 10 MG tablet Commonly known as:  CLARITIN Take 10 mg by mouth. Take one tablet daily   mesalamine 1.2 g EC tablet Commonly known as:  LIALDA take 2 tablets by mouth daily with BREAKFAST   metoprolol 50 MG tablet Commonly known as:  LOPRESSOR Take 0.5 tablets (25 mg total) by mouth 2 (two) times daily.   multivitamin tablet Take 1 tablet by mouth daily.   MYRBETRIQ 25 MG Tb24 tablet Generic drug:  mirabegron ER Take 25 mg by mouth daily.   predniSONE 5 MG tablet Commonly known as:  DELTASONE Take 2 tablets (10 mg total) by mouth daily with breakfast. TWO TABLETs DAILY TO TREAT POLYMYALGIA RHEUMATICA   RA ZINC OXIDE 20 % ointment Generic drug:  zinc oxide Apply 1 application topically. Apply as needed   ranitidine 150 MG tablet Commonly known as:  ZANTAC Take 150 mg by mouth at bedtime. Take one tablet at bedtime   sennosides-docusate sodium 8.6-50 MG tablet Commonly known as:  SENOKOT-S Take 1 tablet by mouth 3 (three) times daily. Take one tablet three times daily for constipation-hold for loose stools   UTI-STAT Liqd Take 30 mLs by mouth 2 (two) times daily.       Review of Systems  Constitutional: Negative for activity change, appetite change, chills, diaphoresis, fatigue, fever and unexpected weight change.  HENT: Negative for congestion, ear discharge, ear pain, hearing loss, postnasal drip, rhinorrhea, sore throat, tinnitus, trouble swallowing and voice change.   Eyes: Positive for visual disturbance (corrective lenses). Negative for pain, redness and itching.       Old CVA  Respiratory: Negative for cough, choking, shortness of breath and wheezing.   Cardiovascular: Negative for chest pain, palpitations  and leg swelling.  Gastrointestinal: Positive for diarrhea, nausea and vomiting. Negative for abdominal distention, abdominal pain and constipation.       Hx ulcerative colitis; on Lialda.  Endocrine: Negative for cold intolerance, heat intolerance, polydipsia, polyphagia and polyuria.       History of elevated glucose.  Genitourinary: Negative for difficulty urinating, dysuria, flank pain, frequency, hematuria, pelvic pain, urgency and vaginal discharge.  Musculoskeletal: Positive for back pain and gait problem. Negative for arthralgias, myalgias, neck pain and neck stiffness.       Hx PMR and chronic use of prednisone. History of osteopenia and kyphosis  Skin: Negative for color change, pallor and rash.       The  right 2nd finger skin cut.   Allergic/Immunologic: Negative.   Neurological: Positive for dizziness, weakness and light-headedness. Negative for tremors, seizures, syncope, numbness and headaches.       History of poliomyelitis at age 38. History of CVA.09/21/15.  Hematological: Negative for adenopathy. Does not bruise/bleed easily.  Psychiatric/Behavioral: Negative for agitation, behavioral problems, confusion, dysphoric mood, hallucinations, sleep disturbance and suicidal ideas. The patient is not nervous/anxious and is not hyperactive.     Immunization History  Administered Date(s) Administered  . Hep A / Hep B 06/08/2013, 06/15/2013, 06/29/2013, 06/12/2014  . Influenza Split 01/01/2014  . Influenza Whole 01/17/2009, 12/02/2010  . Influenza-Unspecified 12/22/2013, 12/21/2014, 01/04/2016  . Pneumococcal Conjugate-13 01/27/2015  . Pneumococcal Polysaccharide-23 07/25/2009  . Td 07/25/2009  . Tdap 12/22/2011   Pertinent  Health Maintenance Due  Topic Date Due  . MAMMOGRAM  07/18/2015  . INFLUENZA VACCINE  Completed  . DEXA SCAN  Completed  . PNA vac Low Risk Adult  Completed   Fall Risk  02/05/2016 11/02/2015 05/01/2015 01/23/2015 07/20/2014  Falls in the past year? Yes No  No No No  Number falls in past yr: 1 - - - -  Injury with Fall? No - - - -   Functional Status Survey:    Vitals:   03/28/16 1226  BP: (!) 186/96  Pulse: 88  Resp: 16  Temp: 98.6 F (37 C)  Height: 5\' 1"  (1.549 m)  WT: 120lb 3.2 oz (54.52)    Body mass index is 20.78 kg/m. Physical Exam  Constitutional: She is oriented to person, place, and time. She appears well-developed and well-nourished. No distress.  HENT:  Right Ear: External ear normal.  Left Ear: External ear normal.  Nose: Nose normal.  Mouth/Throat: Oropharynx is clear and moist. No oropharyngeal exudate.  Wearing hearing aids bilaterally. EAC and TM normal bilaterally.  Eyes: Conjunctivae and EOM are normal. Pupils are equal, round, and reactive to light. No scleral icterus.  Prescription lenses  Neck: No JVD present. No tracheal deviation present. No thyromegaly present.  Cardiovascular: Normal rate, regular rhythm and intact distal pulses.  Exam reveals no gallop and no friction rub.   Murmur heard. Pulmonary/Chest: Effort normal. No respiratory distress. She has no wheezes. She has no rales. She exhibits no tenderness.  Abdominal: Soft. Bowel sounds are normal. She exhibits no distension and no mass. There is no tenderness.  Large diastasis recti  Musculoskeletal: Normal range of motion. She exhibits edema and tenderness.  Kyphosis. Miild gait instability. Right wrist fx, short arm cast, swelling the right fingers. Right arm in a short cast  Lymphadenopathy:    She has no cervical adenopathy.  Neurological: She is alert and oriented to person, place, and time. No cranial nerve deficit. She exhibits abnormal muscle tone. Coordination abnormal.  Mild tremor of extended arms. Left sided weakness  Skin: No rash noted. She is not diaphoretic. No erythema. No pallor.  The right 2nd finger skin cut.   Psychiatric: She has a normal mood and affect. Her behavior is normal. Judgment and thought content normal.     Labs reviewed:  Recent Labs  11/28/15 0849  11/30/15 1941 12/01/15 0224  03/25/16 0508 03/26/16 0443 03/27/16 2230  NA 131*  < > 127* 132*  < > 133* 133* 130*  K 3.3*  < > 3.3* 3.0*  < > 4.1 3.6 3.8  CL 93*  < > 95* 98*  < > 99* 94* 89*  CO2 27  < > 23 27  < >  25 27 28   GLUCOSE 171*  < > 229* 102*  < > 146* 171* 154*  BUN 14  < > 14 12  < > 26* 18 19  CREATININE 0.93  < > 0.97 0.95  < > 1.26* 1.11* 1.09*  CALCIUM 8.4*  < > 8.1* 8.2*  < > 9.1 9.4 11.3*  MG 2.6*  --  1.8 1.8  --   --   --   --   < > = values in this interval not displayed.  Recent Labs  11/26/15 1013 11/27/15 0243 12/12/15 03/13/16 03/23/16 1750  AST 21 18 13 14 28   ALT 20 16 14 14 18   ALKPHOS 64 57 68 60 75  BILITOT 0.8 0.9  --   --  0.8  PROT 7.3 6.1*  --   --  7.9  ALBUMIN 4.0 3.4*  --   --  3.7    Recent Labs  11/26/15 1013  03/23/16 1750  03/25/16 0508 03/26/16 0443 03/27/16 2230  WBC 13.3*  < > 10.4  < > 6.0 6.3 9.1  NEUTROABS 11.3*  --  8.6*  --   --   --  7.1  HGB 13.4  < > 14.1  < > 11.7* 13.0 13.9  HCT 39.1  < > 42.0  < > 35.6* 38.3 40.1  MCV 88.1  < > 93.3  < > 92.5 91.4 90.3  PLT 209  < > 187  < > 165 182 206  < > = values in this interval not displayed. Lab Results  Component Value Date   TSH 1.573 03/24/2016   Lab Results  Component Value Date   HGBA1C 6.3 (H) 03/24/2016   Lab Results  Component Value Date   CHOL 175 03/24/2016   HDL 66 03/24/2016   LDLCALC 78 03/24/2016   TRIG 154 (H) 03/24/2016   CHOLHDL 2.7 03/24/2016    Significant Diagnostic Results in last 30 days:  Dg Chest 2 View  Result Date: 03/27/2016 CLINICAL DATA:  Decreased level of alertness today.  Recent stroke. EXAM: CHEST  2 VIEW COMPARISON:  03/23/2016 FINDINGS: Moderate hyperinflation and mild cardiomegaly, unchanged. The lungs are clear. The pulmonary vasculature is normal. There is no pleural effusion. Hilar, mediastinal and cardiac contours are unremarkable and unchanged. IMPRESSION:  Hyperinflation and cardiomegaly.  No acute findings. Electronically Signed   By: Andreas Newport M.D.   On: 03/27/2016 23:01   Dg Chest 2 View  Result Date: 03/23/2016 CLINICAL DATA:  Evaluate for pneumonia EXAM: CHEST  2 VIEW COMPARISON:  11/26/2015 FINDINGS: Normal mediastinum and cardiac silhouette. Normal pulmonary vasculature. No evidence of effusion, infiltrate, or pneumothorax. No acute bony abnormality. IMPRESSION: No acute cardiopulmonary process. Electronically Signed   By: Suzy Bouchard M.D.   On: 03/23/2016 19:16   Ct Head Wo Contrast  Result Date: 03/27/2016 CLINICAL DATA:  Recent stroke, less alert today since discharge EXAM: CT HEAD WITHOUT CONTRAST TECHNIQUE: Contiguous axial images were obtained from the base of the skull through the vertex without intravenous contrast. COMPARISON:  MRI 03/24/2016, 03/23/2016, CT 03/23/2016 FINDINGS: Brain: No hemorrhage is visualized. Hypodensity in the right frontal parietal cortex and subcortical white matter again visualized, and likely corresponds to previously noted diffusion abnormality. Previously noted subcentimeter restricted diffusion in the right occipital lobe white matter, right basal ganglia and left parietal lobe are not well appreciated by CT. There is no focal mass or midline shift. There is atrophy present as well as cerebellar atrophy. The ventricles are  similar in size and morphology. Mild periventricular white matter hypodensity consistent with small vessel disease. Vascular: No hyperdense vessels.  Carotid artery calcifications. Skull: Fluid in the mastoid air cells.  No skull fracture Sinuses/Orbits: Mucosal thickening in the ethmoid sinuses. No acute orbital abnormality. Other: None IMPRESSION: 1. Hypodensity in the right frontal parietal cortex and subcortical white matter is felt to correspond to known ischemic changes on recent MRI of the brain. The additional ischemic foci noted on the prior MRI are not well appreciated on  today's CT. There is no hemorrhage identified. 2. Small mastoid effusions. Electronically Signed   By: Donavan Foil M.D.   On: 03/27/2016 23:26   Mr Virgel Paling [id number] Contrast  Result Date: 03/24/2016 CLINICAL DATA:  76 year old female with acute or subacute right MCA territory infarcts discovered on brain MRI performed for confusion and left side weakness. History of Severe intracranial stenoses. Initial encounter. EXAM: MRA HEAD WITHOUT CONTRAST TECHNIQUE: Angiographic images of the Circle of Willis were obtained using MRA technique without intravenous contrast. COMPARISON:  Brain MRI 03/23/2016. CTA head and neck 09/24/2015. Intracranial MRA 09/22/2015. FINDINGS: The previous MRA was motion degraded. The findings will be compared to the prior CTA. Antegrade flow in the posterior circulation with stable appearing distal vertebral arteries and basilar artery. No vertebrobasilar stenosis. PCA origins are within normal limits. Bilateral PCA branches are within normal limits. Preserved antegrade flow in both ICA siphons. No right siphon stenosis. Severe supraclinoid left ICA siphon stenosis appears stable from the prior CTA (series 406, image 6) and the left ICA terminus remains patent. Mild irregularity and stenosis at the left ACA origin appears stable. MCA and right ACA origins are stable and within normal limits. Visualized bilateral ACA branches appear stable, mild irregularity and stenosis on the left. Visualized left MCA branches appear stable with mild irregularity. The left MCA bifurcation remains patent. Severe distal right M1 stenosis appears stable (series 406, image 9). Right MCA bifurcation remains patent. Distal MCA flow signal appears fairly stable today, although the right MCA branches do appear somewhat more regular. IMPRESSION: Negative for emergent large vessel occlusion. Widespread anterior circulation atherosclerosis, with relative sparing of the posterior circulation. No significant change  suspected in severe anterior circulation stenoses since The CTA on 09/24/2015 (please see also that report): Severe stenoses of the left ICA supraclinoid segment and right MCA distal M1 segment. Electronically Signed   By: Genevie Ann M.D.   On: 03/24/2016 10:03   Mr Brain Wo Contrast  Result Date: 03/23/2016 CLINICAL DATA:  Dysphasia, LEFT-sided weakness beginning yesterday, worsening today. Confusion and lethargy. Hypertensive. Recent infection. History of hypertension, hyperlipidemia, polio. EXAM: MRI HEAD WITHOUT CONTRAST TECHNIQUE: Multiplanar, multiecho pulse sequences of the brain and surrounding structures were obtained without intravenous contrast. COMPARISON:  CT HEAD March 23, 2016 at 6 p.m. and MRI of the head September 22, 2015 FINDINGS: Sequences are moderately or severely motion degraded. BRAIN: Subcentimeter reduced diffusion RIGHT occipital lobe periventricular white matter, RIGHT basal ganglia. Patchy reduced diffusion RIGHT frontoparietal lobes, punctate reduced diffusion LEFT parietal lobe. Due to motion, limited assessment on ADC map. No susceptibility artifact to suggest large hemorrhage, limited assessment due to patient motion. Moderate ventriculomegaly on the basis of global parenchymal brain volume loss. Patchy supratentorial white matter FLAIR T2 hyperintensities. No abnormal extra-axial fluid collections. VASCULAR: Major intracranial vascular flow voids present at skull base. SKULL AND UPPER CERVICAL SPINE: Probable partially empty sella. No suspicious calvarial bone marrow signal. Craniocervical junction maintained. SINUSES/ORBITS: Bilateral mastoid effusions.  Status post bilateral ocular lens implants. OTHER: None. IMPRESSION: Moderate to severely motion degraded examination. Acute versus subacute multi territorial infarcts including RIGHT MCA territory, most consistent with embolic phenomena. Electronically Signed   By: Elon Alas M.D.   On: 03/23/2016 23:42   Ct Head Code  Stroke W/o Cm  Result Date: 03/23/2016 CLINICAL DATA:  Code stroke. Right-sided weakness. Last seen normal 3 hours ago. EXAM: CT HEAD WITHOUT CONTRAST TECHNIQUE: Contiguous axial images were obtained from the base of the skull through the vertex without intravenous contrast. COMPARISON:  12/21/2015 and multiple previous.  MRI 09/21/2015. FINDINGS: Brain: There is old infarction in the right parietal cortical and subcortical brain. No sign of acute infarction, mass lesion, hemorrhage, hydrocephalus or extra-axial collection. Mild small vessel changes of the white matter. Vascular: There is atherosclerotic calcification of the major vessels at the base of the brain. Skull: Negative Sinuses/Orbits: Sinuses are clear. Small mastoid effusions. Orbits negative. Other: None ASPECTS ([place] Stroke Program Early CT Score) - Ganglionic level infarction (caudate, lentiform nuclei, internal capsule, insula, M1-M3 cortex): 6 - Supraganglionic infarction (M4-M6 cortex): 3 Total score (0-10 with 10 being normal): 9 IMPRESSION: 1. No acute infarction suspected by CT. Old infarction in the right posterior parietal cortical and subcortical brain. 2. ASPECTS is 9, though the abnormality in M 6 is likely chronic. These results were paged by telephone at the time of interpretation on 03/23/2016 at 6:04 pm to Dr. Cheral Marker, awaiting response. Electronically Signed   By: Nelson Chimes M.D.   On: 03/23/2016 18:06    Assessment/Plan Essential hypertension Elevated, 180/96 today, ED eval for elevated BP last night, Continue Metoprolol 50mg , Diltiazem 90mg  bid, staff stated the patient pocketing pills, will add Clonidine patch, hold Metoprolol and Diltiazem if Bp<130/90  Paroxysmal atrial fibrillation (HCC) Heart rate is in controlled, continue Metoprolol and Diltiazem.     Ulcerative colitis ([place]) Left-sided colitis, in remission, on lialda, diagnosed 2006 Continue Lialda.    GERD (gastroesophageal reflux disease) Stable,  continue Ranitidine.    Polymyalgia rheumatica (HCC) Managed with Prednisone 5mg  daily     CVA, old, disturbances of vision Hx of CVA 08/2015. Vision disturbance, HA, left sided weakness. Fall 12/30/15, takes ASA and Eliquis.   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

## 2016-03-28 NOTE — Assessment & Plan Note (Signed)
Heart rate is in controlled, continue Metoprolol and Diltiazem.

## 2016-03-28 NOTE — ED Notes (Signed)
Paged PTAR   

## 2016-03-28 NOTE — Assessment & Plan Note (Signed)
Stable, continue Ranitidine.  

## 2016-03-28 NOTE — Assessment & Plan Note (Signed)
Hx of CVA 08/2015. Vision disturbance, HA, left sided weakness. Fall 12/30/15, takes ASA and Eliquis.

## 2016-04-01 ENCOUNTER — Encounter: Payer: Self-pay | Admitting: Nurse Practitioner

## 2016-04-01 ENCOUNTER — Non-Acute Institutional Stay (SKILLED_NURSING_FACILITY): Payer: Medicare Other | Admitting: Nurse Practitioner

## 2016-04-01 DIAGNOSIS — K219 Gastro-esophageal reflux disease without esophagitis: Secondary | ICD-10-CM

## 2016-04-01 DIAGNOSIS — I10 Essential (primary) hypertension: Secondary | ICD-10-CM

## 2016-04-01 DIAGNOSIS — I679 Cerebrovascular disease, unspecified: Secondary | ICD-10-CM

## 2016-04-01 DIAGNOSIS — K5100 Ulcerative (chronic) pancolitis without complications: Secondary | ICD-10-CM | POA: Diagnosis not present

## 2016-04-01 DIAGNOSIS — M353 Polymyalgia rheumatica: Secondary | ICD-10-CM | POA: Diagnosis not present

## 2016-04-01 DIAGNOSIS — R1314 Dysphagia, pharyngoesophageal phase: Secondary | ICD-10-CM | POA: Diagnosis not present

## 2016-04-01 DIAGNOSIS — F29 Unspecified psychosis not due to a substance or known physiological condition: Secondary | ICD-10-CM

## 2016-04-01 DIAGNOSIS — I471 Supraventricular tachycardia: Secondary | ICD-10-CM

## 2016-04-01 DIAGNOSIS — I4719 Other supraventricular tachycardia: Secondary | ICD-10-CM

## 2016-04-01 DIAGNOSIS — R55 Syncope and collapse: Secondary | ICD-10-CM | POA: Diagnosis not present

## 2016-04-01 DIAGNOSIS — S52501D Unspecified fracture of the lower end of right radius, subsequent encounter for closed fracture with routine healing: Secondary | ICD-10-CM | POA: Diagnosis not present

## 2016-04-01 DIAGNOSIS — I63511 Cerebral infarction due to unspecified occlusion or stenosis of right middle cerebral artery: Secondary | ICD-10-CM | POA: Diagnosis not present

## 2016-04-01 NOTE — Assessment & Plan Note (Signed)
Managed with Prednisone 5mg daily 

## 2016-04-01 NOTE — Assessment & Plan Note (Signed)
Continue ASA, Bp goal is SBP 130-150

## 2016-04-01 NOTE — Assessment & Plan Note (Signed)
Reported delusion and hallucination, new onset, ? Related Clonidine-will dc it, will continue to observe symptoms, update CBC CMP UA C/S

## 2016-04-01 NOTE — Progress Notes (Signed)
Location:  [facility] Room Number: 54 Place of Service:  SNF (31) Provider:  Mast, Manxie  NP  Jeanmarie Hubert, MD  Patient Care Team: [name], MD as PCP - General (Internal Medicine) [name], MD as Consulting Physician (Cardiology) [name], MD as Consulting Physician (Ophthalmology) [name], MD as Consulting Physician (Gastroenterology) [name], MD as Consulting Physician (Dermatology)  Extended Emergency Contact Information Primary Emergency Contact: [name]) Address: [place], [place] [address] [name] of [place] Phone: [phone] Mobile Phone: [phone] Relation: Brother Secondary Emergency Contact: [name] [place] of [place] Phone: [phone] Relation: Sister  Code Status:  DNR Goals of care: Advanced Directive information Advanced Directives 04/01/2016  Does Patient Have a Medical Advance Directive? Yes  Type of Paramedic of [place];Out of facility DNR (pink MOST or yellow form)  Does patient want to make changes to medical advance directive? No - Patient declined  Copy of [place] in Chart? Yes  Pre-existing out of facility DNR order (yellow form or pink MOST form) Yellow form placed in chart (order not valid for inpatient use)     Chief Complaint  Patient presents with  . Acute Visit    syncope,    HPI:  Pt is a 76 y.o. female seen today for an acute visit for syncope, increased confusion, generalized weakness. Reported new onset of delusion and hallucination w/o new focal neurological deficits.   ED eval 03/27/16 Metoprolol 25mg  Diltiazem 90mg  x1. CT head ischemic changes.  CXR showed no acute findings.               Hospitalized 03/23/16 to 03/27/16 UTI, will complete Amoxicillin 04/02/15 then resume Trimethoprim, f/u Urology. MRI; notable for multiple acute vs subacute infarcts in right MCA  territory suggestive of embolic etiology  -found on tele to have a fib so TEE cancelled-- eliquis started, will keep ASA 81mg  due to intracranial stenosis     Past Medical History:  Diagnosis Date  . Colitis   . Ecchymosis 11/15/2015  . Fracture of right radius 02/2016  . GERD (gastroesophageal reflux disease)   . Heart murmur   . Hyperlipidemia   . Hypertension   . Osteopenia    s/p 7 yrs Fosamax  . Polio   . Polymyalgia ([place])   . Rheumatic fever   . Sleep disorder   . Stroke ([place])   . Ulcerative colitis    Past Surgical History:  Procedure Laterality Date  . BREAST LUMPECTOMY  1986   left; benign  . CATARACT EXTRACTION     Left eye   . COLONOSCOPY  2006  . Colonosopy  07/05/13   Dr. Deatra Ina  . DILATION AND CURETTAGE OF UTERUS  12/1999  . MELANOMA EXCISION  2007   left arm  . NECK SURGERY  1986   Benign growth on neck removed  . TONSILLECTOMY      No Known Allergies  Allergies as of 04/01/2016   No Known Allergies     Medication List       Accurate as of 04/01/16  1:29 PM. Always use your most recent med list.          acetaminophen 325 MG tablet Commonly known as:  TYLENOL Take 650 mg by mouth every 6 (six) hours as needed.   apixaban 5 MG Tabs tablet Commonly known as:  ELIQUIS Take  1 tablet (5 mg total) by mouth 2 (two) times daily.   aspirin 81 MG EC tablet Take 1 tablet (81 mg total) by mouth daily.   atorvastatin 10 MG tablet Commonly known as:  LIPITOR Take 1 tablet (10 mg total) by mouth daily at 6 PM.   cloNIDine 0.1 mg/24hr patch Commonly known as:  CATAPRES - Dosed in mg/24 hr Place 0.1 mg onto the skin once a week.   lactose free nutrition Liqd Take 237 mLs by mouth every evening. Drink one can daily   loratadine 10 MG tablet Commonly known as:  CLARITIN Take 10 mg by mouth. Take one tablet daily   mesalamine 1.2 g EC tablet Commonly known as:  LIALDA take 2 tablets by mouth daily with BREAKFAST   metoprolol 50 MG  tablet Commonly known as:  LOPRESSOR Take 0.5 tablets (25 mg total) by mouth 2 (two) times daily.   multivitamin tablet Take 1 tablet by mouth daily.   MYRBETRIQ 25 MG Tb24 tablet Generic drug:  mirabegron ER Take 25 mg by mouth daily.   predniSONE 5 MG tablet Commonly known as:  DELTASONE Take 2 tablets (10 mg total) by mouth daily with breakfast. TWO TABLETs DAILY TO TREAT POLYMYALGIA RHEUMATICA   RA ZINC OXIDE 20 % ointment Generic drug:  zinc oxide Apply 1 application topically. Apply as needed   ranitidine 150 MG tablet Commonly known as:  ZANTAC Take 150 mg by mouth at bedtime. Take one tablet at bedtime   sennosides-docusate sodium 8.6-50 MG tablet Commonly known as:  SENOKOT-S Take 1 tablet by mouth 3 (three) times daily. Take one tablet three times daily for constipation-hold for loose stools   UTI-STAT Liqd Take 30 mLs by mouth 2 (two) times daily.       Review of Systems  Constitutional: Positive for appetite change, fatigue and unexpected weight change. Negative for chills, diaphoresis and fever.  HENT: Negative for congestion, ear discharge, ear pain, hearing loss, postnasal drip, rhinorrhea, sore throat, tinnitus, trouble swallowing and voice change.   Eyes: Positive for visual disturbance (corrective lenses). Negative for pain, redness and itching.       Old CVA  Respiratory: Negative for cough, choking, shortness of breath and wheezing.   Cardiovascular: Negative for chest pain, palpitations and leg swelling.  Gastrointestinal: Positive for diarrhea, nausea and vomiting. Negative for abdominal distention, abdominal pain and constipation.       Hx ulcerative colitis; on Lialda.  Endocrine: Negative for cold intolerance, heat intolerance, polydipsia, polyphagia and polyuria.       History of elevated glucose.  Genitourinary: Negative for difficulty urinating, dysuria, flank pain, frequency, hematuria, pelvic pain, urgency and vaginal discharge.   Musculoskeletal: Positive for back pain and gait problem. Negative for arthralgias, myalgias, neck pain and neck stiffness.       Hx PMR and chronic use of prednisone. History of osteopenia and kyphosis  Skin: Negative for color change, pallor and rash.       The right 2nd finger skin cut.   Allergic/Immunologic: Negative.   Neurological: Positive for dizziness, weakness and light-headedness. Negative for tremors, seizures, syncope, numbness and headaches.       History of poliomyelitis at age 56. History of CVA.09/21/15.  Hematological: Negative for adenopathy. Does not bruise/bleed easily.  Psychiatric/Behavioral: Positive for confusion and decreased concentration. Negative for agitation, behavioral problems, dysphoric mood, hallucinations, sleep disturbance and suicidal ideas. The patient is not nervous/anxious and is not hyperactive.     Immunization History  Administered Date(s) Administered  . Hep A / Hep B 06/08/2013, 06/15/2013, 06/29/2013, 06/12/2014  . Influenza Split 01/01/2014  . Influenza Whole 01/17/2009, 12/02/2010  . Influenza-Unspecified 12/22/2013, 12/21/2014, 01/04/2016  . Pneumococcal Conjugate-13 01/27/2015  . Pneumococcal Polysaccharide-23 07/25/2009  . Td 07/25/2009  . Tdap 12/22/2011   Pertinent  Health Maintenance Due  Topic Date Due  . MAMMOGRAM  07/18/2015  . INFLUENZA VACCINE  Completed  . DEXA SCAN  Completed  . PNA vac Low Risk Adult  Completed   Fall Risk  02/05/2016 11/02/2015 05/01/2015 01/23/2015 07/20/2014  Falls in the past year? Yes No No No No  Number falls in past yr: 1 - - - -  Injury with Fall? No - - - -   Functional Status Survey:    Vitals:   04/01/16 1122  BP: 130/66  Pulse: 62  Resp: 16  Temp: 98.2 F (36.8 C)  SpO2: 97%  Weight: 120 lb 3.2 oz (54.5 kg)  Height: 5\' 1"  (1.549 m)   Body mass index is 22.71 kg/m. Physical Exam  Constitutional: She is oriented to person, place, and time. She appears well-developed and  well-nourished. No distress.  HENT:  Right Ear: External ear normal.  Left Ear: External ear normal.  Nose: Nose normal.  Mouth/Throat: Oropharynx is clear and moist. No oropharyngeal exudate.  Wearing hearing aids bilaterally. EAC and TM normal bilaterally.  Eyes: Conjunctivae and EOM are normal. Pupils are equal, round, and reactive to light. No scleral icterus.  Prescription lenses  Neck: No JVD present. No tracheal deviation present. No thyromegaly present.  Cardiovascular: Normal rate, regular rhythm and intact distal pulses.  Exam reveals no gallop and no friction rub.   Murmur heard. Pulmonary/Chest: Effort normal. No respiratory distress. She has no wheezes. She has no rales. She exhibits no tenderness.  Abdominal: Soft. Bowel sounds are normal. She exhibits no distension and no mass. There is no tenderness.  Large diastasis recti  Musculoskeletal: Normal range of motion. She exhibits edema and tenderness.  Kyphosis. Miild gait instability. Right wrist fx, short arm cast, swelling the right fingers. Right arm in a short cast  Lymphadenopathy:    She has no cervical adenopathy.  Neurological: She is alert and oriented to person, place, and time. No cranial nerve deficit. She exhibits abnormal muscle tone. Coordination abnormal.  Mild tremor of extended arms. Left sided weakness  Skin: No rash noted. She is not diaphoretic. No erythema. No pallor.  The right 2nd finger skin cut.   Psychiatric: She has a normal mood and affect. Her behavior is normal. Judgment and thought content normal.    Labs reviewed:  Recent Labs  11/28/15 0849  11/30/15 1941 12/01/15 0224  03/25/16 0508 03/26/16 0443 03/27/16 2230  NA 131*  < > 127* 132*  < > 133* 133* 130*  K 3.3*  < > 3.3* 3.0*  < > 4.1 3.6 3.8  CL 93*  < > 95* 98*  < > 99* 94* 89*  CO2 27  < > 23 27  < > 25 27 28   GLUCOSE 171*  < > 229* 102*  < > 146* 171* 154*  BUN 14  < > 14 12  < > 26* 18 19  CREATININE 0.93  < > 0.97 0.95   < > 1.26* 1.11* 1.09*  CALCIUM 8.4*  < > 8.1* 8.2*  < > 9.1 9.4 11.3*  MG 2.6*  --  1.8 1.8  --   --   --   --   < > =  values in this interval not displayed.  Recent Labs  11/26/15 1013 11/27/15 0243 12/12/15 03/13/16 03/23/16 1750  AST 21 18 13 14 28   ALT 20 16 14 14 18   ALKPHOS 64 57 68 60 75  BILITOT 0.8 0.9  --   --  0.8  PROT 7.3 6.1*  --   --  7.9  ALBUMIN 4.0 3.4*  --   --  3.7    Recent Labs  11/26/15 1013  03/23/16 1750  03/25/16 0508 03/26/16 0443 03/27/16 2230  WBC 13.3*  < > 10.4  < > 6.0 6.3 9.1  NEUTROABS 11.3*  --  8.6*  --   --   --  7.1  HGB 13.4  < > 14.1  < > 11.7* 13.0 13.9  HCT 39.1  < > 42.0  < > 35.6* 38.3 40.1  MCV 88.1  < > 93.3  < > 92.5 91.4 90.3  PLT 209  < > 187  < > 165 182 206  < > = values in this interval not displayed. Lab Results  Component Value Date   TSH 1.573 03/24/2016   Lab Results  Component Value Date   HGBA1C 6.3 (H) 03/24/2016   Lab Results  Component Value Date   CHOL 175 03/24/2016   HDL 66 03/24/2016   LDLCALC 78 03/24/2016   TRIG 154 (H) 03/24/2016   CHOLHDL 2.7 03/24/2016    Significant Diagnostic Results in last 30 days:  Dg Chest 2 View  Result Date: 03/27/2016 CLINICAL DATA:  Decreased level of alertness today.  Recent stroke. EXAM: CHEST  2 VIEW COMPARISON:  03/23/2016 FINDINGS: Moderate hyperinflation and mild cardiomegaly, unchanged. The lungs are clear. The pulmonary vasculature is normal. There is no pleural effusion. Hilar, mediastinal and cardiac contours are unremarkable and unchanged. IMPRESSION: Hyperinflation and cardiomegaly.  No acute findings. Electronically Signed   By: Andreas Newport M.D.   On: 03/27/2016 23:01   Dg Chest 2 View  Result Date: 03/23/2016 CLINICAL DATA:  Evaluate for pneumonia EXAM: CHEST  2 VIEW COMPARISON:  11/26/2015 FINDINGS: Normal mediastinum and cardiac silhouette. Normal pulmonary vasculature. No evidence of effusion, infiltrate, or pneumothorax. No acute bony  abnormality. IMPRESSION: No acute cardiopulmonary process. Electronically Signed   By: Suzy Bouchard M.D.   On: 03/23/2016 19:16   Ct Head Wo Contrast  Result Date: 03/27/2016 CLINICAL DATA:  Recent stroke, less alert today since discharge EXAM: CT HEAD WITHOUT CONTRAST TECHNIQUE: Contiguous axial images were obtained from the base of the skull through the vertex without intravenous contrast. COMPARISON:  MRI 03/24/2016, 03/23/2016, CT 03/23/2016 FINDINGS: Brain: No hemorrhage is visualized. Hypodensity in the right frontal parietal cortex and subcortical white matter again visualized, and likely corresponds to previously noted diffusion abnormality. Previously noted subcentimeter restricted diffusion in the right occipital lobe white matter, right basal ganglia and left parietal lobe are not well appreciated by CT. There is no focal mass or midline shift. There is atrophy present as well as cerebellar atrophy. The ventricles are similar in size and morphology. Mild periventricular white matter hypodensity consistent with small vessel disease. Vascular: No hyperdense vessels.  Carotid artery calcifications. Skull: Fluid in the mastoid air cells.  No skull fracture Sinuses/Orbits: Mucosal thickening in the ethmoid sinuses. No acute orbital abnormality. Other: None IMPRESSION: 1. Hypodensity in the right frontal parietal cortex and subcortical white matter is felt to correspond to known ischemic changes on recent MRI of the brain. The additional ischemic foci noted on the prior MRI  are not well appreciated on today's CT. There is no hemorrhage identified. 2. Small mastoid effusions. Electronically Signed   By: Donavan Foil M.D.   On: 03/27/2016 23:26   Mr Virgel Paling [id number] Contrast  Result Date: 03/24/2016 CLINICAL DATA:  76 year old female with acute or subacute right MCA territory infarcts discovered on brain MRI performed for confusion and left side weakness. History of Severe intracranial stenoses. Initial  encounter. EXAM: MRA HEAD WITHOUT CONTRAST TECHNIQUE: Angiographic images of the Circle of Willis were obtained using MRA technique without intravenous contrast. COMPARISON:  Brain MRI 03/23/2016. CTA head and neck 09/24/2015. Intracranial MRA 09/22/2015. FINDINGS: The previous MRA was motion degraded. The findings will be compared to the prior CTA. Antegrade flow in the posterior circulation with stable appearing distal vertebral arteries and basilar artery. No vertebrobasilar stenosis. PCA origins are within normal limits. Bilateral PCA branches are within normal limits. Preserved antegrade flow in both ICA siphons. No right siphon stenosis. Severe supraclinoid left ICA siphon stenosis appears stable from the prior CTA (series 406, image 6) and the left ICA terminus remains patent. Mild irregularity and stenosis at the left ACA origin appears stable. MCA and right ACA origins are stable and within normal limits. Visualized bilateral ACA branches appear stable, mild irregularity and stenosis on the left. Visualized left MCA branches appear stable with mild irregularity. The left MCA bifurcation remains patent. Severe distal right M1 stenosis appears stable (series 406, image 9). Right MCA bifurcation remains patent. Distal MCA flow signal appears fairly stable today, although the right MCA branches do appear somewhat more regular. IMPRESSION: Negative for emergent large vessel occlusion. Widespread anterior circulation atherosclerosis, with relative sparing of the posterior circulation. No significant change suspected in severe anterior circulation stenoses since The CTA on 09/24/2015 (please see also that report): Severe stenoses of the left ICA supraclinoid segment and right MCA distal M1 segment. Electronically Signed   By: Genevie Ann M.D.   On: 03/24/2016 10:03   Mr Brain Wo Contrast  Result Date: 03/23/2016 CLINICAL DATA:  Dysphasia, LEFT-sided weakness beginning yesterday, worsening today. Confusion and  lethargy. Hypertensive. Recent infection. History of hypertension, hyperlipidemia, polio. EXAM: MRI HEAD WITHOUT CONTRAST TECHNIQUE: Multiplanar, multiecho pulse sequences of the brain and surrounding structures were obtained without intravenous contrast. COMPARISON:  CT HEAD March 23, 2016 at 6 p.m. and MRI of the head September 22, 2015 FINDINGS: Sequences are moderately or severely motion degraded. BRAIN: Subcentimeter reduced diffusion RIGHT occipital lobe periventricular white matter, RIGHT basal ganglia. Patchy reduced diffusion RIGHT frontoparietal lobes, punctate reduced diffusion LEFT parietal lobe. Due to motion, limited assessment on ADC map. No susceptibility artifact to suggest large hemorrhage, limited assessment due to patient motion. Moderate ventriculomegaly on the basis of global parenchymal brain volume loss. Patchy supratentorial white matter FLAIR T2 hyperintensities. No abnormal extra-axial fluid collections. VASCULAR: Major intracranial vascular flow voids present at skull base. SKULL AND UPPER CERVICAL SPINE: Probable partially empty sella. No suspicious calvarial bone marrow signal. Craniocervical junction maintained. SINUSES/ORBITS: Bilateral mastoid effusions. Status post bilateral ocular lens implants. OTHER: None. IMPRESSION: Moderate to severely motion degraded examination. Acute versus subacute multi territorial infarcts including RIGHT MCA territory, most consistent with embolic phenomena. Electronically Signed   By: Elon Alas M.D.   On: 03/23/2016 23:42   Ct Head Code Stroke W/o Cm  Result Date: 03/23/2016 CLINICAL DATA:  Code stroke. Right-sided weakness. Last seen normal 3 hours ago. EXAM: CT HEAD WITHOUT CONTRAST TECHNIQUE: Contiguous axial images were obtained from the base  of the skull through the vertex without intravenous contrast. COMPARISON:  12/21/2015 and multiple previous.  MRI 09/21/2015. FINDINGS: Brain: There is old infarction in the right parietal cortical  and subcortical brain. No sign of acute infarction, mass lesion, hemorrhage, hydrocephalus or extra-axial collection. Mild small vessel changes of the white matter. Vascular: There is atherosclerotic calcification of the major vessels at the base of the brain. Skull: Negative Sinuses/Orbits: Sinuses are clear. Small mastoid effusions. Orbits negative. Other: None ASPECTS ([place] Stroke Program Early CT Score) - Ganglionic level infarction (caudate, lentiform nuclei, internal capsule, insula, M1-M3 cortex): 6 - Supraganglionic infarction (M4-M6 cortex): 3 Total score (0-10 with 10 being normal): 9 IMPRESSION: 1. No acute infarction suspected by CT. Old infarction in the right posterior parietal cortical and subcortical brain. 2. ASPECTS is 9, though the abnormality in M 6 is likely chronic. These results were paged by telephone at the time of interpretation on 03/23/2016 at 6:04 pm to Dr. Cheral Marker, awaiting response. Electronically Signed   By: Nelson Chimes M.D.   On: 03/23/2016 18:06    Assessment/Plan Essential hypertension Continue Metoprolol 50mg , Diltiazem 90mg  bid, dc Clonidine patch, Cardiology evaluation. Update CBC CMP UA C/S  Atrial tachycardia (HCC) Heart rate is in controlled, continue Metoprolol and Diltiazem.     Intracranial vascular stenosis Continue ASA, Bp goal is SBP 130-150  Acute ischemic right MCA stroke (HCC) Hx of CVA 08/2015. Vision disturbance, HA, left sided weakness. Fall 12/30/15, takes ASA and Eliquis.    Ulcerative colitis ([place]) Left-sided colitis, in remission, on lialda, diagnosed 2006 Continue Lialda.    GERD (gastroesophageal reflux disease) Stable, continue Ranitidine.    Traumatic closed nondisplaced fracture of distal end of right radius 03/10/16 Ortho, x-ray report showed a non-displaced distal radius fracture. I was not able to review the x-ray images itself. Short arm cast for 4 weeks, platform weightbearing follow-up in 4 weeks with 2 view x-rays of  the right wrist out of the cast. Swelling right fingers but denied tingling, numbness, or pain  Polymyalgia rheumatica ([place]) Managed with Prednisone 5mg  daily     Syncope x1 episode, resolved w/o intervention, the patient has no recollection of the event, denied chest pain, SOB, palpitation, no O2 desaturation, she is afebrile, no noted vomiting or diarrhea or new focal weakness, will update CBC CMP UA C/S MMSE, Cardiology for evaluation of BP and syncope episode.   Psychosis Reported delusion and hallucination, new onset, ? Related Clonidine-will dc it, will continue to observe symptoms, update CBC CMP UA C/S  Dysphagia, pharyngoesophageal phase Related to Hx of CVA, ST to eval and tx as indicated.      Family/ staff Communication: Cardiology evaluation.   Labs/tests ordered:  CBC CMP UA C/S

## 2016-04-01 NOTE — Assessment & Plan Note (Signed)
Hx of CVA 08/2015. Vision disturbance, HA, left sided weakness. Fall 12/30/15, takes ASA and Eliquis.

## 2016-04-01 NOTE — Assessment & Plan Note (Signed)
Stable, continue Ranitidine.  

## 2016-04-01 NOTE — Assessment & Plan Note (Signed)
03/10/16 Ortho, x-ray report showed a non-displaced distal radius fracture. I was not able to review the x-ray images itself. Short arm cast for 4 weeks, platform weightbearing follow-up in 4 weeks with 2 view x-rays of the right wrist out of the cast. Swelling right fingers but denied tingling, numbness, or pain

## 2016-04-01 NOTE — Assessment & Plan Note (Signed)
Heart rate is in controlled, continue Metoprolol and Diltiazem.

## 2016-04-01 NOTE — Assessment & Plan Note (Signed)
Continue Metoprolol 50mg , Diltiazem 90mg  bid, dc Clonidine patch, Cardiology evaluation. Update CBC CMP UA C/S

## 2016-04-01 NOTE — Assessment & Plan Note (Signed)
Left-sided colitis, in remission, on lialda, diagnosed 2006 Continue Lialda.

## 2016-04-01 NOTE — Assessment & Plan Note (Signed)
x1 episode, resolved w/o intervention, the patient has no recollection of the event, denied chest pain, SOB, palpitation, no O2 desaturation, she is afebrile, no noted vomiting or diarrhea or new focal weakness, will update CBC CMP UA C/S MMSE, Cardiology for evaluation of BP and syncope episode.

## 2016-04-01 NOTE — Assessment & Plan Note (Signed)
Related to Hx of CVA, ST to eval and tx as indicated.

## 2016-04-03 ENCOUNTER — Encounter: Payer: Self-pay | Admitting: Internal Medicine

## 2016-04-03 ENCOUNTER — Non-Acute Institutional Stay (SKILLED_NURSING_FACILITY): Payer: Medicare Other | Admitting: Internal Medicine

## 2016-04-03 ENCOUNTER — Telehealth: Payer: Self-pay

## 2016-04-03 DIAGNOSIS — I63411 Cerebral infarction due to embolism of right middle cerebral artery: Secondary | ICD-10-CM

## 2016-04-03 DIAGNOSIS — M353 Polymyalgia rheumatica: Secondary | ICD-10-CM

## 2016-04-03 DIAGNOSIS — I63511 Cerebral infarction due to unspecified occlusion or stenosis of right middle cerebral artery: Secondary | ICD-10-CM

## 2016-04-03 DIAGNOSIS — K219 Gastro-esophageal reflux disease without esophagitis: Secondary | ICD-10-CM | POA: Diagnosis not present

## 2016-04-03 DIAGNOSIS — S52501D Unspecified fracture of the lower end of right radius, subsequent encounter for closed fracture with routine healing: Secondary | ICD-10-CM | POA: Diagnosis not present

## 2016-04-03 DIAGNOSIS — G8194 Hemiplegia, unspecified affecting left nondominant side: Secondary | ICD-10-CM

## 2016-04-03 DIAGNOSIS — E785 Hyperlipidemia, unspecified: Secondary | ICD-10-CM

## 2016-04-03 DIAGNOSIS — K5100 Ulcerative (chronic) pancolitis without complications: Secondary | ICD-10-CM | POA: Diagnosis not present

## 2016-04-03 DIAGNOSIS — N3000 Acute cystitis without hematuria: Secondary | ICD-10-CM

## 2016-04-03 DIAGNOSIS — I10 Essential (primary) hypertension: Secondary | ICD-10-CM

## 2016-04-03 DIAGNOSIS — R7303 Prediabetes: Secondary | ICD-10-CM

## 2016-04-03 DIAGNOSIS — R269 Unspecified abnormalities of gait and mobility: Secondary | ICD-10-CM

## 2016-04-03 NOTE — Telephone Encounter (Signed)
Message left on clinical intake voicemail: Tammy Beard is returning Anita's call.  I called Tammy Beard back and left message for her to return call when available. I was unable to locate a conversation/phone note between Tammy Beard and Rodena Piety (Clinical Intake assistant).

## 2016-04-03 NOTE — Telephone Encounter (Signed)
Juliann Pulse had sent a form over requesting ManXie's signature on a form. I called Ivin Booty and Ivin Booty had already completed this and stated to disregard.

## 2016-04-03 NOTE — Progress Notes (Signed)
History and Physical       Location:  [facility] Room Number: [id number] Place of Service:  SNF (31)  PCP: Jeanmarie Hubert, MD Patient Care Team: [name], MD as PCP - General (Internal Medicine) [name], MD as Consulting Physician (Cardiology) [name], MD as Consulting Physician (Ophthalmology) [name], MD as Consulting Physician (Gastroenterology) [name], MD as Consulting Physician (Dermatology)  Extended Emergency Contact Information Primary Emergency Contact: [name]) Address: [place], [place] [address] [name] of [place] Phone: [phone] Mobile Phone: [phone] Relation: Brother Secondary Emergency Contact: [name] [place] of [place] Phone: [phone] Relation: Sister  Code Status: DNR Goals of Care: Advanced Directive information Advanced Directives 04/03/2016  Does Patient Have a Medical Advance Directive? Yes  Type of Paramedic of [place];Living will;Out of facility DNR (pink MOST or yellow form)  Does patient want to make changes to medical advance directive? -  Copy of [place] in Chart? Yes  Pre-existing out of facility DNR order (yellow form or pink MOST form) Yellow form placed in chart (order not valid for inpatient use);Pink MOST form placed in chart (order not valid for inpatient use)      Chief Complaint  Patient presents with  . Readmit To SNF    following hospitalization 03/23/16 to 03/27/16 acute ischemic right MCA stroke    HPI: Patient is a 76 y.o. female seen today for readmission on 03/27/16 to [facility] SNF following hospitalization from 03/23/16 to 03/27/16. Patient was sent to the ER due to new onset of left sided weakness and altered mental status. Evaluation resulted in diagnosis of new right MCA territory CVA. Brain scan showed multiple embolic ares. She also had AF during the  hospitalization. She has been started on Eliquis.  She had left sided weakness on admission to the hospital. She continues to show evidence of weakness in the left leg, but the grip and strength in the left arm have improved.  Urine appeared to be infected with enterococcus faecalis. This was treated with amoxicillin. She has had multiple UTI with various organisms oeer the last 6 months.  Patient continues with PT and OT for strengthening and improvement in gait. Our hope is that she will ultimately improve enough to consider a move to [place].    There are multiple other diagnoses:  PMR controlled with prednisone  Encephalopathy during hospitalization that seems to have cleared  Ulcerative colitis that is in remission  Recent right radius fracture for which she has a right short arm cast  HTN that is controlled  HLD  GERD  Hyperglycemia    Past Medical History:  Diagnosis Date  . Colitis   . Ecchymosis 11/15/2015  . Fracture of right radius 02/2016  . GERD (gastroesophageal reflux disease)   . Heart murmur   . Hyperlipidemia   . Hypertension   . Osteopenia    s/p 7 yrs Fosamax  . Polio   . Polymyalgia ([place])   . Rheumatic fever   . Sleep disorder   . Stroke ([place])   . Ulcerative colitis    Past Surgical History:  Procedure Laterality Date  . BREAST LUMPECTOMY  1986   left; benign  . CATARACT EXTRACTION     Left eye   . COLONOSCOPY  2006  . Colonosopy  07/05/13   Dr. Deatra Ina  . DILATION AND CURETTAGE  OF UTERUS  12/1999  . MELANOMA EXCISION  2007   left arm  . NECK SURGERY  1986   Benign growth on neck removed  . TONSILLECTOMY      reports that she has never smoked. She has never used smokeless tobacco. She reports that she drinks alcohol. She reports that she does not use drugs. Social History   Social History  . Marital status: Single    Spouse name: N/A  . Number of children: N/A  . Years of education: N/A   Occupational History  . retired    Social History  Main Topics  . Smoking status: Never Smoker  . Smokeless tobacco: Never Used  . Alcohol use Yes     Comment: occasional once a year  . Drug use: No  . Sexual activity: No   Other Topics Concern  . Not on file   Social History Narrative   Daily Caffeine use- soda (diet)      Do you drink/ eat things with caffeine? 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    No                             Living Will, POA, DNR, MOST      Family History  Problem Relation Age of Onset  . Pancreatic cancer Other   . Other Mother     Coad/ Bronchiectosis  . Diabetes Paternal Grandmother   . Heart failure Paternal Grandmother   . Cancer Paternal Aunt     unaware of primary site  . Coronary artery disease Neg Hx   . Colon cancer Neg Hx   . Rectal cancer Neg Hx   . Stomach cancer Neg Hx   . Heart attack Neg Hx     Health Maintenance  Topic Date Due  . ZOSTAVAX  11/19/1999  . MAMMOGRAM  07/18/2015  . TETANUS/TDAP  12/21/2021  . INFLUENZA VACCINE  Completed  . DEXA SCAN  Completed  . PNA vac Low Risk Adult  Completed    No Known Allergies  Allergies as of 04/03/2016   No Known Allergies     Medication List       Accurate as of 04/03/16  4:17 PM. Always use your most recent med list.          acetaminophen 325 MG tablet Commonly known as:  TYLENOL Take 650 mg by mouth every 6 (six) hours as needed.   apixaban 5 MG Tabs tablet Commonly known as:  ELIQUIS Take 1 tablet (5 mg total) by mouth 2 (two) times daily.   aspirin 81 MG EC tablet Take 1 tablet (81 mg total) by mouth daily.   atorvastatin 10 MG tablet Commonly known as:  LIPITOR Take 1 tablet (10 mg total) by mouth daily at 6 PM.   diltiazem 90 MG tablet Commonly  known as:  CARDIZEM Take 90 mg by mouth. Take one tablet every 12 hours. Hold if BP is  less than 130/90   lactose free nutrition Liqd Take 237 mLs by mouth every evening. Drink one can daily   loratadine 10 MG tablet Commonly known as:  CLARITIN Take 10 mg by mouth. Take one tablet daily   mesalamine 1.2 g EC tablet Commonly known as:  LIALDA take 2 tablets by mouth daily with BREAKFAST   metoprolol 50 MG tablet Commonly known as:  LOPRESSOR Take 0.5 tablets (25 mg total) by mouth 2 (two) times daily.   multivitamin tablet Take 1 tablet by mouth daily.   MYRBETRIQ 25 MG Tb24 tablet Generic drug:  mirabegron ER Take 25 mg by mouth daily.   predniSONE 5 MG tablet Commonly known as:  DELTASONE Take 2 tablets (10 mg total) by mouth daily with breakfast. TWO TABLETs DAILY TO TREAT POLYMYALGIA RHEUMATICA   RA ZINC OXIDE 20 % ointment Generic drug:  zinc oxide Apply 1 application topically. Apply as needed   ranitidine 150 MG tablet Commonly known as:  ZANTAC Take 150 mg by mouth at bedtime. Take one tablet at bedtime   UTI-STAT Liqd Take 30 mLs by mouth 2 (two) times daily.       Review of Systems  Constitutional: Negative for activity change, appetite change, chills, diaphoresis, fatigue, fever and unexpected weight change.  HENT: Negative for congestion, ear discharge, ear pain, hearing loss, postnasal drip, rhinorrhea, sore throat, tinnitus, trouble swallowing and voice change.   Eyes: Positive for visual disturbance (corrective lenses). Negative for pain, redness and itching.       Old CVA  Respiratory: Negative for cough, choking, shortness of breath and wheezing.   Cardiovascular: Negative for chest pain, palpitations and leg swelling.  Gastrointestinal: Positive for diarrhea, nausea and vomiting. Negative for abdominal distention, abdominal pain and constipation.       Hx ulcerative colitis; on Lialda. Hx GERD, currently asymptomatic.  Endocrine: Negative for  cold intolerance, heat intolerance, polydipsia, polyphagia and polyuria.       History of elevated glucose.  Genitourinary: Positive for frequency. Negative for difficulty urinating, dysuria, flank pain, hematuria, pelvic pain, urgency and vaginal discharge.       Nocturia. Recurrent UTI.  Musculoskeletal: Positive for back pain and gait problem. Negative for arthralgias, myalgias, neck pain and neck stiffness.       Hx PMR and chronic use of prednisone. History of osteopenia and kyphosis. 03/10/16- fracture of the distal right radius. In forearm cast. Complains of weakness and discomfort in the low back and increased problems getting out of her recliner chair.  Skin: Negative for color change, pallor and rash.       The right 2nd finger skin cut.   Allergic/Immunologic: Negative.   Neurological: Positive for weakness (left hemiparesis affecting the left leg mainly) and light-headedness. Negative for dizziness, tremors, seizures, syncope, numbness and headaches.       History of poliomyelitis at age 20. History of CVA.09/21/15, 03/23/16. Encephalopathy during last hospital stay .  Hematological: Negative for adenopathy. Does not bruise/bleed easily.  Psychiatric/Behavioral: Positive for confusion, decreased concentration and dysphoric mood. Negative for agitation, behavioral problems, hallucinations, sleep disturbance and suicidal ideas. The patient is nervous/anxious. The patient is not hyperactive.     Vitals:   04/03/16 1606  BP: (!) 200/96  Pulse: 83  Resp: 20  Temp: 98.5 F (36.9 C)  SpO2: 99%  Weight: 117 lb (53.1 kg)  Height: 5\' 1"  (1.549 m)   Body mass index is 22.11 kg/m. Physical Exam  Constitutional: She appears well-developed and  well-nourished. No distress.  HENT:  Right Ear: External ear normal.  Left Ear: External ear normal.  Nose: Nose normal.  Mouth/Throat: Oropharynx is clear and moist. No oropharyngeal exudate.  Wearing hearing aids bilaterally. EAC and TM  normal bilaterally.  Eyes: Conjunctivae and EOM are normal. Pupils are equal, round, and reactive to light. No scleral icterus.  Prescription lenses  Neck: No JVD present. No tracheal deviation present. No thyromegaly present.  Cardiovascular: Normal rate, regular rhythm and intact distal pulses.  Exam reveals no gallop and no friction rub.   Murmur heard. Pulmonary/Chest: Effort normal. No respiratory distress. She has no wheezes. She has no rales. She exhibits no tenderness.  Abdominal: Soft. Bowel sounds are normal. She exhibits no distension and no mass. There is no tenderness.  Large diastasis recti  Musculoskeletal: Normal range of motion. She exhibits edema and tenderness.  Kyphosis. Miild gait instability. Right wrist fx, short arm cast, swelling the right fingers. Right arm in a short cast  Lymphadenopathy:    She has no cervical adenopathy.  Neurological: She is alert. No cranial nerve deficit. She exhibits abnormal muscle tone. Coordination abnormal.  Mild tremor of extended arms. Left sided weakness with main effect on the left leg. Confused.  Skin: No rash noted. She is not diaphoretic. No erythema. No pallor.  Psychiatric: She has a normal mood and affect. Her behavior is normal. Judgment and thought content normal.    Labs reviewed: Basic Metabolic Panel:  Recent Labs  11/28/15 0849  11/30/15 1941 12/01/15 0224  03/25/16 0508 03/26/16 0443 03/27/16 2230  NA 131*  < > 127* 132*  < > 133* 133* 130*  K 3.3*  < > 3.3* 3.0*  < > 4.1 3.6 3.8  CL 93*  < > 95* 98*  < > 99* 94* 89*  CO2 27  < > 23 27  < > 25 27 28   GLUCOSE 171*  < > 229* 102*  < > 146* 171* 154*  BUN 14  < > 14 12  < > 26* 18 19  CREATININE 0.93  < > 0.97 0.95  < > 1.26* 1.11* 1.09*  CALCIUM 8.4*  < > 8.1* 8.2*  < > 9.1 9.4 11.3*  MG 2.6*  --  1.8 1.8  --   --   --   --   < > = values in this interval not displayed. Liver Function Tests:  Recent Labs  11/26/15 1013 11/27/15 0243 12/12/15 03/13/16  03/23/16 1750  AST 21 18 13 14 28   ALT 20 16 14 14 18   ALKPHOS 64 57 68 60 75  BILITOT 0.8 0.9  --   --  0.8  PROT 7.3 6.1*  --   --  7.9  ALBUMIN 4.0 3.4*  --   --  3.7   No results for input(s): LIPASE, AMYLASE in the last 8760 hours.  Recent Labs  03/24/16 0027  AMMONIA 42*   CBC:  Recent Labs  11/26/15 1013  03/23/16 1750  03/25/16 0508 03/26/16 0443 03/27/16 2230  WBC 13.3*  < > 10.4  < > 6.0 6.3 9.1  NEUTROABS 11.3*  --  8.6*  --   --   --  7.1  HGB 13.4  < > 14.1  < > 11.7* 13.0 13.9  HCT 39.1  < > 42.0  < > 35.6* 38.3 40.1  MCV 88.1  < > 93.3  < > 92.5 91.4 90.3  PLT 209  < > 187  < >  165 182 206  < > = values in this interval not displayed. Cardiac Enzymes:  Recent Labs  11/26/15 1013 11/26/15 1640 11/26/15 2300  TROPONINI 0.06* 0.07* 0.07*   BNP: Invalid input(s): POCBNP Lab Results  Component Value Date   HGBA1C 6.3 (H) 03/24/2016   Lab Results  Component Value Date   TSH 1.573 03/24/2016   Lab Results  Component Value Date   [id number] 03/24/2016   No results found for: FOLATE Lab Results  Component Value Date   IRON 43 12/07/2014    Imaging and Procedures obtained prior to SNF admission: Dg Chest 2 View  Result Date: 03/27/2016 CLINICAL DATA:  Decreased level of alertness today.  Recent stroke. EXAM: CHEST  2 VIEW COMPARISON:  03/23/2016 FINDINGS: Moderate hyperinflation and mild cardiomegaly, unchanged. The lungs are clear. The pulmonary vasculature is normal. There is no pleural effusion. Hilar, mediastinal and cardiac contours are unremarkable and unchanged. IMPRESSION: Hyperinflation and cardiomegaly.  No acute findings. Electronically Signed   By: Andreas Newport M.D.   On: 03/27/2016 23:01   Ct Head Wo Contrast  Result Date: 03/27/2016 CLINICAL DATA:  Recent stroke, less alert today since discharge EXAM: CT HEAD WITHOUT CONTRAST TECHNIQUE: Contiguous axial images were obtained from the base of the skull through the vertex  without intravenous contrast. COMPARISON:  MRI 03/24/2016, 03/23/2016, CT 03/23/2016 FINDINGS: Brain: No hemorrhage is visualized. Hypodensity in the right frontal parietal cortex and subcortical white matter again visualized, and likely corresponds to previously noted diffusion abnormality. Previously noted subcentimeter restricted diffusion in the right occipital lobe white matter, right basal ganglia and left parietal lobe are not well appreciated by CT. There is no focal mass or midline shift. There is atrophy present as well as cerebellar atrophy. The ventricles are similar in size and morphology. Mild periventricular white matter hypodensity consistent with small vessel disease. Vascular: No hyperdense vessels.  Carotid artery calcifications. Skull: Fluid in the mastoid air cells.  No skull fracture Sinuses/Orbits: Mucosal thickening in the ethmoid sinuses. No acute orbital abnormality. Other: None IMPRESSION: 1. Hypodensity in the right frontal parietal cortex and subcortical white matter is felt to correspond to known ischemic changes on recent MRI of the brain. The additional ischemic foci noted on the prior MRI are not well appreciated on today's CT. There is no hemorrhage identified. 2. Small mastoid effusions. Electronically Signed   By: Donavan Foil M.D.   On: 03/27/2016 23:26    Assessment/Plan 1. Cerebrovascular accident (CVA) due to embolism of right middle cerebral artery ([place]) recovering  2. Left hemiparesis ([place]) Continue with rehab--PT and OT  3. Acute cystitis without hematuria Resolved. History of recurrent infections. High rish for relapse. Seeing Dr. Matilde Sprang.  4. Abnormality of gait PT  5. Pre-diabetes monitor glucose  6. Polymyalgia rheumatica (H[facility]) continue prednisone  7. Ulcerative pancolitis without complication ([place]) In remission  8. Closed traumatic nondisplaced fracture of distal end of right radius with routine healing, subsequent encounter Continue right  arm cast and orthopedic appointments  9. Essential hypertension Most BP in acceptable range, but it is high today. She does not appear to be in any distress this morning. Will continue to monitor.  10. Dyslipidemia Recheck lab in future  11. Gastroesophageal reflux disease without esophagitis asymptomatic

## 2016-04-07 ENCOUNTER — Telehealth: Payer: Self-pay | Admitting: *Deleted

## 2016-04-07 NOTE — Telephone Encounter (Signed)
Patient's brother, Ruthell Ostrow, [place] called and stated that he would like to speak with Dr. Nyoka Cowden Concerning patient's condition. He stated she is unable to understand or convey her Condition. He would like for you to call him with an update. Please call # [phone]

## 2016-04-08 NOTE — Telephone Encounter (Signed)
I returned call and discussed issues of the AF and embolic stroke as well as medications to try to prevent this from occurring again.

## 2016-04-10 ENCOUNTER — Ambulatory Visit (INDEPENDENT_AMBULATORY_CARE_PROVIDER_SITE_OTHER): Payer: Medicare Other | Admitting: Orthopaedic Surgery

## 2016-04-15 ENCOUNTER — Ambulatory Visit: Payer: Medicare Other | Admitting: Nurse Practitioner

## 2016-04-15 ENCOUNTER — Ambulatory Visit (INDEPENDENT_AMBULATORY_CARE_PROVIDER_SITE_OTHER): Payer: Medicare Other | Admitting: Orthopaedic Surgery

## 2016-04-18 ENCOUNTER — Encounter: Payer: Self-pay | Admitting: Nurse Practitioner

## 2016-04-18 ENCOUNTER — Encounter (INDEPENDENT_AMBULATORY_CARE_PROVIDER_SITE_OTHER): Payer: Self-pay | Admitting: Orthopaedic Surgery

## 2016-04-18 ENCOUNTER — Non-Acute Institutional Stay (SKILLED_NURSING_FACILITY): Payer: Medicare Other | Admitting: Nurse Practitioner

## 2016-04-18 ENCOUNTER — Ambulatory Visit (INDEPENDENT_AMBULATORY_CARE_PROVIDER_SITE_OTHER): Payer: No Typology Code available for payment source | Admitting: Orthopaedic Surgery

## 2016-04-18 ENCOUNTER — Ambulatory Visit (INDEPENDENT_AMBULATORY_CARE_PROVIDER_SITE_OTHER): Payer: No Typology Code available for payment source

## 2016-04-18 DIAGNOSIS — M353 Polymyalgia rheumatica: Secondary | ICD-10-CM | POA: Diagnosis not present

## 2016-04-18 DIAGNOSIS — I10 Essential (primary) hypertension: Secondary | ICD-10-CM

## 2016-04-18 DIAGNOSIS — R350 Frequency of micturition: Secondary | ICD-10-CM | POA: Diagnosis not present

## 2016-04-18 DIAGNOSIS — K5100 Ulcerative (chronic) pancolitis without complications: Secondary | ICD-10-CM

## 2016-04-18 DIAGNOSIS — S52501D Unspecified fracture of the lower end of right radius, subsequent encounter for closed fracture with routine healing: Secondary | ICD-10-CM

## 2016-04-18 DIAGNOSIS — I63411 Cerebral infarction due to embolism of right middle cerebral artery: Secondary | ICD-10-CM

## 2016-04-18 DIAGNOSIS — K219 Gastro-esophageal reflux disease without esophagitis: Secondary | ICD-10-CM

## 2016-04-18 DIAGNOSIS — I480 Paroxysmal atrial fibrillation: Secondary | ICD-10-CM | POA: Diagnosis not present

## 2016-04-18 NOTE — Assessment & Plan Note (Signed)
Heart rate is in controlled, continue Metoprolol and Diltiazem. Continue Eliquis.

## 2016-04-18 NOTE — Assessment & Plan Note (Signed)
MRI; notable for multiple acute vs subacute infarcts in right MCA territory suggestive of embolic etiology  -found on tele to have a fib so TEE cancelled-- eliquis started -keep ASA 81mg  due to intracranial stenosis

## 2016-04-18 NOTE — Progress Notes (Signed)
Patient comes back today for follow-up of her right distal radius fracture. She is overall doing well and not complaining of pain. X-ray show healed distal radius fracture. At this point we will give her a Velcro wrist brace for support which she can try to start weaning out of. I do want her to do therapy to increase her range of motion and strength. I'll see her back as needed.

## 2016-04-18 NOTE — Progress Notes (Signed)
Location:  [facility] Room Number: 8 Place of Service:  SNF (31) Provider:  Mast, Manxie  NP  Jeanmarie Hubert, MD  Patient Care Team: [name], MD as PCP - General (Internal Medicine) [name], MD as Consulting Physician (Cardiology) [name], MD as Consulting Physician (Ophthalmology) [name], MD as Consulting Physician (Gastroenterology) [name], MD as Consulting Physician (Dermatology)  Extended Emergency Contact Information Primary Emergency Contact: [name]) Address: [place], [place] [address] [name] of [place] Phone: [phone] Mobile Phone: [phone] Relation: Brother Secondary Emergency Contact: [name] [place] of [place] Phone: [phone] Relation: Sister  Code Status:  DNR Goals of care: Advanced Directive information Advanced Directives 04/18/2016  Does Patient Have a Medical Advance Directive? Yes  Type of Paramedic of [place];Living will;Out of facility DNR (pink MOST or yellow form)  Does patient want to make changes to medical advance directive? No - Patient declined  Copy of [place] in Chart? Yes  Pre-existing out of facility DNR order (yellow form or pink MOST form) -     Chief Complaint  Patient presents with  . Acute Visit    medication list to be discussed with family    HPI:  Pt is a 76 y.o. female seen today for an acute visit for    Past Medical History:  Diagnosis Date  . Colitis   . Ecchymosis 11/15/2015  . Fracture of right radius 02/2016  . GERD (gastroesophageal reflux disease)   . Heart murmur   . Hyperlipidemia   . Hypertension   . Osteopenia    s/p 7 yrs Fosamax  . Polio   . Polymyalgia ([place])   . Rheumatic fever   . Sleep disorder   . Stroke ([place])   . Ulcerative colitis    Past Surgical History:  Procedure Laterality Date  . BREAST LUMPECTOMY   1986   left; benign  . CATARACT EXTRACTION     Left eye   . COLONOSCOPY  2006  . Colonosopy  07/05/13   Dr. Deatra Ina  . DILATION AND CURETTAGE OF UTERUS  12/1999  . MELANOMA EXCISION  2007   left arm  . NECK SURGERY  1986   Benign growth on neck removed  . TONSILLECTOMY      No Known Allergies  Allergies as of 04/18/2016   No Known Allergies     Medication List       Accurate as of 04/18/16  1:44 PM. Always use your most recent med list.          acetaminophen 325 MG tablet Commonly known as:  TYLENOL Take 650 mg by mouth every 6 (six) hours as needed.   apixaban 5 MG Tabs tablet Commonly known as:  ELIQUIS Take 1 tablet (5 mg total) by mouth 2 (two) times daily.   aspirin 81 MG EC tablet Take 1 tablet (81 mg total) by mouth daily.   atorvastatin 10 MG tablet Commonly known as:  LIPITOR Take 1 tablet (10 mg total) by mouth daily at 6 PM.   diltiazem 90 MG tablet Commonly known as:  CARDIZEM Take 90 mg by mouth. Take one tablet every 12 hours. Hold if BP is less than 130/90   lactose free nutrition Liqd Take 237 mLs by mouth every evening. Drink one can daily   loratadine 10 MG tablet Commonly known as:  CLARITIN Take 10 mg by mouth. Take one tablet daily   mesalamine 1.2 g EC tablet Commonly known as:  LIALDA take 2 tablets by mouth daily with BREAKFAST   metoprolol 50 MG tablet Commonly known as:  LOPRESSOR Take 0.5 tablets (25 mg total) by mouth 2 (two) times daily.   multivitamin tablet Take 1 tablet by mouth daily.   MYRBETRIQ 25 MG Tb24 tablet Generic drug:  mirabegron ER Take 25 mg by mouth daily.   predniSONE 5 MG tablet Commonly known as:  DELTASONE Take 2 tablets (10 mg total) by mouth daily with breakfast. TWO TABLETs DAILY TO TREAT POLYMYALGIA RHEUMATICA   RA ZINC OXIDE 20 % ointment Generic drug:  zinc oxide Apply 1 application topically. Apply as needed   ranitidine 150 MG tablet Commonly known as:  ZANTAC Take 150 mg by mouth at  bedtime. Take one tablet at bedtime   UTI-STAT Liqd Take 30 mLs by mouth 2 (two) times daily.       Review of Systems  Immunization History  Administered Date(s) Administered  . Hep A / Hep B 06/08/2013, 06/15/2013, 06/29/2013, 06/12/2014  . Influenza Split 01/01/2014  . Influenza Whole 01/17/2009, 12/02/2010  . Influenza-Unspecified 12/22/2013, 12/21/2014, 01/04/2016  . Pneumococcal Conjugate-13 01/27/2015  . Pneumococcal Polysaccharide-23 07/25/2009  . Td 07/25/2009  . Tdap 12/22/2011   Pertinent  Health Maintenance Due  Topic Date Due  . MAMMOGRAM  07/18/2015  . INFLUENZA VACCINE  Completed  . DEXA SCAN  Completed  . PNA vac Low Risk Adult  Completed   Fall Risk  02/05/2016 11/02/2015 05/01/2015 01/23/2015 07/20/2014  Falls in the past year? Yes No No No No  Number falls in past yr: 1 - - - -  Injury with Fall? No - - - -   Functional Status Survey:    Vitals:   04/18/16 1334  BP: 124/88  Pulse: 65  Resp: 18  Temp: 97.5 F (36.4 C)  Weight: 116 lb (52.6 kg)  Height: 5\' 1"  (1.549 m)   Body mass index is 21.92 kg/m. Physical Exam  Labs reviewed:  Recent Labs  11/28/15 0849  11/30/15 1941 12/01/15 0224  03/25/16 0508 03/26/16 0443 03/27/16 2230  NA 131*  < > 127* 132*  < > 133* 133* 130*  K 3.3*  < > 3.3* 3.0*  < > 4.1 3.6 3.8  CL 93*  < > 95* 98*  < > 99* 94* 89*  CO2 27  < > 23 27  < > 25 27 28   GLUCOSE 171*  < > 229* 102*  < > 146* 171* 154*  BUN 14  < > 14 12  < > 26* 18 19  CREATININE 0.93  < > 0.97 0.95  < > 1.26* 1.11* 1.09*  CALCIUM 8.4*  < > 8.1* 8.2*  < > 9.1 9.4 11.3*  MG 2.6*  --  1.8 1.8  --   --   --   --   < > = values in this interval not displayed.  Recent Labs  11/26/15 1013 11/27/15 0243 12/12/15 03/13/16 03/23/16 1750  AST 21 18 13 14 28   ALT 20 16 14 14 18   ALKPHOS 64 57 68 60 75  BILITOT 0.8 0.9  --   --  0.8  PROT 7.3 6.1*  --   --  7.9  ALBUMIN 4.0 3.4*  --   --  3.7    Recent Labs  11/26/15 1013  03/23/16 1750   03/25/16  [id number] 03/26/16 0443 03/27/16 2230  WBC 13.3*  < > 10.4  < > 6.0 6.3 9.1  NEUTROABS 11.3*  --  8.6*  --   --   --  7.1  HGB 13.4  < > 14.1  < > 11.7* 13.0 13.9  HCT 39.1  < > 42.0  < > 35.6* 38.3 40.1  MCV 88.1  < > 93.3  < > 92.5 91.4 90.3  PLT 209  < > 187  < > 165 182 206  < > = values in this interval not displayed. Lab Results  Component Value Date   TSH 1.573 03/24/2016   Lab Results  Component Value Date   HGBA1C 6.3 (H) 03/24/2016   Lab Results  Component Value Date   CHOL 175 03/24/2016   HDL 66 03/24/2016   LDLCALC 78 03/24/2016   TRIG 154 (H) 03/24/2016   CHOLHDL 2.7 03/24/2016    Significant Diagnostic Results in last 30 days:  Dg Chest 2 View  Result Date: 03/27/2016 CLINICAL DATA:  Decreased level of alertness today.  Recent stroke. EXAM: CHEST  2 VIEW COMPARISON:  03/23/2016 FINDINGS: Moderate hyperinflation and mild cardiomegaly, unchanged. The lungs are clear. The pulmonary vasculature is normal. There is no pleural effusion. Hilar, mediastinal and cardiac contours are unremarkable and unchanged. IMPRESSION: Hyperinflation and cardiomegaly.  No acute findings. Electronically Signed   By: Andreas Newport M.D.   On: 03/27/2016 23:01   Dg Chest 2 View  Result Date: 03/23/2016 CLINICAL DATA:  Evaluate for pneumonia EXAM: CHEST  2 VIEW COMPARISON:  11/26/2015 FINDINGS: Normal mediastinum and cardiac silhouette. Normal pulmonary vasculature. No evidence of effusion, infiltrate, or pneumothorax. No acute bony abnormality. IMPRESSION: No acute cardiopulmonary process. Electronically Signed   By: Suzy Bouchard M.D.   On: 03/23/2016 19:16   Ct Head Wo Contrast  Result Date: 03/27/2016 CLINICAL DATA:  Recent stroke, less alert today since discharge EXAM: CT HEAD WITHOUT CONTRAST TECHNIQUE: Contiguous axial images were obtained from the base of the skull through the vertex without intravenous contrast. COMPARISON:  MRI 03/24/2016, 03/23/2016, CT 03/23/2016  FINDINGS: Brain: No hemorrhage is visualized. Hypodensity in the right frontal parietal cortex and subcortical white matter again visualized, and likely corresponds to previously noted diffusion abnormality. Previously noted subcentimeter restricted diffusion in the right occipital lobe white matter, right basal ganglia and left parietal lobe are not well appreciated by CT. There is no focal mass or midline shift. There is atrophy present as well as cerebellar atrophy. The ventricles are similar in size and morphology. Mild periventricular white matter hypodensity consistent with small vessel disease. Vascular: No hyperdense vessels.  Carotid artery calcifications. Skull: Fluid in the mastoid air cells.  No skull fracture Sinuses/Orbits: Mucosal thickening in the ethmoid sinuses. No acute orbital abnormality. Other: None IMPRESSION: 1. Hypodensity in the right frontal parietal cortex and subcortical white matter is felt to correspond to known ischemic changes on recent MRI of the brain. The additional ischemic foci noted on the prior MRI are not well appreciated on today's CT. There is no hemorrhage identified. 2. Small mastoid effusions. Electronically Signed   By: Donavan Foil M.D.   On: 03/27/2016 23:26   Mr Tammy Beard [id number] Contrast  Result Date: 03/24/2016 CLINICAL DATA:  76 year old female with acute or subacute right MCA territory infarcts discovered on brain MRI performed for confusion and left side weakness. History of Severe intracranial stenoses. Initial encounter. EXAM: MRA HEAD WITHOUT CONTRAST TECHNIQUE: Angiographic images of the Circle of Willis  were obtained using MRA technique without intravenous contrast. COMPARISON:  Brain MRI 03/23/2016. CTA head and neck 09/24/2015. Intracranial MRA 09/22/2015. FINDINGS: The previous MRA was motion degraded. The findings will be compared to the prior CTA. Antegrade flow in the posterior circulation with stable appearing distal vertebral arteries and basilar  artery. No vertebrobasilar stenosis. PCA origins are within normal limits. Bilateral PCA branches are within normal limits. Preserved antegrade flow in both ICA siphons. No right siphon stenosis. Severe supraclinoid left ICA siphon stenosis appears stable from the prior CTA (series 406, image 6) and the left ICA terminus remains patent. Mild irregularity and stenosis at the left ACA origin appears stable. MCA and right ACA origins are stable and within normal limits. Visualized bilateral ACA branches appear stable, mild irregularity and stenosis on the left. Visualized left MCA branches appear stable with mild irregularity. The left MCA bifurcation remains patent. Severe distal right M1 stenosis appears stable (series 406, image 9). Right MCA bifurcation remains patent. Distal MCA flow signal appears fairly stable today, although the right MCA branches do appear somewhat more regular. IMPRESSION: Negative for emergent large vessel occlusion. Widespread anterior circulation atherosclerosis, with relative sparing of the posterior circulation. No significant change suspected in severe anterior circulation stenoses since The CTA on 09/24/2015 (please see also that report): Severe stenoses of the left ICA supraclinoid segment and right MCA distal M1 segment. Electronically Signed   By: Genevie Ann M.D.   On: 03/24/2016 10:03   Mr Brain Wo Contrast  Result Date: 03/23/2016 CLINICAL DATA:  Dysphasia, LEFT-sided weakness beginning yesterday, worsening today. Confusion and lethargy. Hypertensive. Recent infection. History of hypertension, hyperlipidemia, polio. EXAM: MRI HEAD WITHOUT CONTRAST TECHNIQUE: Multiplanar, multiecho pulse sequences of the brain and surrounding structures were obtained without intravenous contrast. COMPARISON:  CT HEAD March 23, 2016 at 6 p.m. and MRI of the head September 22, 2015 FINDINGS: Sequences are moderately or severely motion degraded. BRAIN: Subcentimeter reduced diffusion RIGHT occipital  lobe periventricular white matter, RIGHT basal ganglia. Patchy reduced diffusion RIGHT frontoparietal lobes, punctate reduced diffusion LEFT parietal lobe. Due to motion, limited assessment on ADC map. No susceptibility artifact to suggest large hemorrhage, limited assessment due to patient motion. Moderate ventriculomegaly on the basis of global parenchymal brain volume loss. Patchy supratentorial white matter FLAIR T2 hyperintensities. No abnormal extra-axial fluid collections. VASCULAR: Major intracranial vascular flow voids present at skull base. SKULL AND UPPER CERVICAL SPINE: Probable partially empty sella. No suspicious calvarial bone marrow signal. Craniocervical junction maintained. SINUSES/ORBITS: Bilateral mastoid effusions. Status post bilateral ocular lens implants. OTHER: None. IMPRESSION: Moderate to severely motion degraded examination. Acute versus subacute multi territorial infarcts including RIGHT MCA territory, most consistent with embolic phenomena. Electronically Signed   By: Elon Alas M.D.   On: 03/23/2016 23:42   Ct Head Code Stroke W/o Cm  Result Date: 03/23/2016 CLINICAL DATA:  Code stroke. Right-sided weakness. Last seen normal 3 hours ago. EXAM: CT HEAD WITHOUT CONTRAST TECHNIQUE: Contiguous axial images were obtained from the base of the skull through the vertex without intravenous contrast. COMPARISON:  12/21/2015 and multiple previous.  MRI 09/21/2015. FINDINGS: Brain: There is old infarction in the right parietal cortical and subcortical brain. No sign of acute infarction, mass lesion, hemorrhage, hydrocephalus or extra-axial collection. Mild small vessel changes of the white matter. Vascular: There is atherosclerotic calcification of the major vessels at the base of the brain. Skull: Negative Sinuses/Orbits: Sinuses are clear. Small mastoid effusions. Orbits negative. Other: None ASPECTS [facility] Early  CT Score) - Ganglionic level infarction (caudate,  lentiform nuclei, internal capsule, insula, M1-M3 cortex): 6 - Supraganglionic infarction (M4-M6 cortex): 3 Total score (0-10 with 10 being normal): 9 IMPRESSION: 1. No acute infarction suspected by CT. Old infarction in the right posterior parietal cortical and subcortical brain. 2. ASPECTS is 9, though the abnormality in M 6 is likely chronic. These results were paged by telephone at the time of interpretation on 03/23/2016 at 6:04 pm to Dr. Cheral Marker, awaiting response. Electronically Signed   By: Nelson Chimes M.D.   On: 03/23/2016 18:06    Assessment/Plan There are no diagnoses linked to this encounter.   Family/ staff Communication:   Labs/tests ordered:

## 2016-04-18 NOTE — Assessment & Plan Note (Signed)
Persists, [name] 25mg daily. Observe the patient.  

## 2016-04-18 NOTE — Assessment & Plan Note (Signed)
Managed with Prednisone 5mg daily 

## 2016-04-18 NOTE — Assessment & Plan Note (Signed)
Left-sided colitis, in remission, on lialda, diagnosed 2006 Continue Lialda.

## 2016-04-18 NOTE — Progress Notes (Signed)
Location:  [facility] Room Number: 29 Place of Service:  SNF (31) Provider:  Chidera Dearcos, [place]  NP  Jeanmarie Hubert, MD  Patient Care Team: [name], MD as PCP - General (Internal Medicine) [name], MD as Consulting Physician (Cardiology) [name], MD as Consulting Physician (Ophthalmology) [name], MD as Consulting Physician (Gastroenterology) [name], MD as Consulting Physician (Dermatology)  Extended Emergency Contact Information Primary Emergency Contact: [name]) Address: [place], [place] [address] [name] of [place] Phone: [phone] Mobile Phone: [phone] Relation: Brother Secondary Emergency Contact: [name] [place] of [place] Phone: [phone] Relation: Sister  Code Status:  DNR Goals of care: Advanced Directive information Advanced Directives 04/18/2016  Does Patient Have a Medical Advance Directive? Yes  Type of Paramedic of [place];Living will;Out of facility DNR (pink MOST or yellow form)  Does patient want to make changes to medical advance directive? No - Patient declined  Copy of [place] in Chart? Yes  Pre-existing out of facility DNR order (yellow form or pink MOST form) -     Chief Complaint  Patient presents with  . Acute Visit    medication list to be discussed with family    HPI:  Pt is a 76 y.o. female seen today for an acute visit for evaluation of medications.   ED eval 03/27/16 CT head ischemic changes.  CXR showed no acute findings.               Hospitalized 03/23/16 to 03/27/16 UTI, will complete Amoxicillin 04/02/15 then resume Trimethoprim, f/u Urology. MRI; notable for multiple acute vs subacute infarcts in right MCA territory suggestive of embolic etiology   Found on tele to have a fib so TEE cancelled-- eliquis started, will keep ASA 81mg  due to intracranial stenosis   Hx  of Afib, takign Meotprolol, Cardizem,  Eliquis, urinary frequency, taking Myrbetriq, Ulcerative colitis, stable on Mesalamine Past Medical History:  Diagnosis Date  . Colitis   . Ecchymosis 11/15/2015  . Fracture of right radius 02/2016  . GERD (gastroesophageal reflux disease)   . Heart murmur   . Hyperlipidemia   . Hypertension   . Osteopenia    s/p 7 yrs Fosamax  . Polio   . Polymyalgia ([place])   . Rheumatic fever   . Sleep disorder   . Stroke ([place])   . Ulcerative colitis    Past Surgical History:  Procedure Laterality Date  . BREAST LUMPECTOMY  1986   left; benign  . CATARACT EXTRACTION     Left eye   . COLONOSCOPY  2006  . Colonosopy  07/05/13   Dr. Deatra Ina  . DILATION AND CURETTAGE OF UTERUS  12/1999  . MELANOMA EXCISION  2007   left arm  . NECK SURGERY  1986   Benign growth on neck removed  . TONSILLECTOMY      No Known Allergies  Allergies as of 04/18/2016   No Known Allergies     Medication List       Accurate as of 04/18/16  2:07 PM. Always use your most recent med list.          acetaminophen 325 MG tablet Commonly known as:  TYLENOL Take 650 mg by mouth every 6 (six) hours as needed.   apixaban 5 MG Tabs tablet Commonly known as:  ELIQUIS Take 1 tablet (5 mg total) by  mouth 2 (two) times daily.   aspirin 81 MG EC tablet Take 1 tablet (81 mg total) by mouth daily.   atorvastatin 10 MG tablet Commonly known as:  LIPITOR Take 1 tablet (10 mg total) by mouth daily at 6 PM.   diltiazem 90 MG tablet Commonly known as:  CARDIZEM Take 90 mg by mouth. Take one tablet every 12 hours. Hold if BP is less than 130/90   lactose free nutrition Liqd Take 237 mLs by mouth every evening. Drink one can daily   loratadine 10 MG tablet Commonly known as:  CLARITIN Take 10 mg by mouth. Take one tablet daily   mesalamine 1.2 g EC tablet Commonly known as:  LIALDA take 2 tablets by mouth daily with BREAKFAST   metoprolol 50 MG tablet Commonly known as:   LOPRESSOR Take 0.5 tablets (25 mg total) by mouth 2 (two) times daily.   multivitamin tablet Take 1 tablet by mouth daily.   MYRBETRIQ 25 MG Tb24 tablet Generic drug:  mirabegron ER Take 25 mg by mouth daily.   predniSONE 5 MG tablet Commonly known as:  DELTASONE Take 2 tablets (10 mg total) by mouth daily with breakfast. TWO TABLETs DAILY TO TREAT POLYMYALGIA RHEUMATICA   RA ZINC OXIDE 20 % ointment Generic drug:  zinc oxide Apply 1 application topically. Apply as needed   ranitidine 150 MG tablet Commonly known as:  ZANTAC Take 150 mg by mouth at bedtime. Take one tablet at bedtime   UTI-STAT Liqd Take 30 mLs by mouth 2 (two) times daily.       Review of Systems  Constitutional: Positive for appetite change, fatigue and unexpected weight change. Negative for chills, diaphoresis and fever.  HENT: Negative for congestion, ear discharge, ear pain, hearing loss, postnasal drip, rhinorrhea, sore throat, tinnitus, trouble swallowing and voice change.   Eyes: Positive for visual disturbance (corrective lenses). Negative for pain, redness and itching.       Old CVA  Respiratory: Negative for cough, choking, shortness of breath and wheezing.   Cardiovascular: Negative for chest pain, palpitations and leg swelling.  Gastrointestinal: Positive for diarrhea, nausea and vomiting. Negative for abdominal distention, abdominal pain and constipation.       Hx ulcerative colitis; on Lialda.  Endocrine: Negative for cold intolerance, heat intolerance, polydipsia, polyphagia and polyuria.       History of elevated glucose.  Genitourinary: Negative for difficulty urinating, dysuria, flank pain, frequency, hematuria, pelvic pain, urgency and vaginal discharge.  Musculoskeletal: Positive for back pain and gait problem. Negative for arthralgias, myalgias, neck pain and neck stiffness.       Hx PMR and chronic use of prednisone. History of osteopenia and kyphosis  Skin: Negative for color change,  pallor and rash.       The right 2nd finger skin cut.   Allergic/Immunologic: Negative.   Neurological: Positive for dizziness, weakness and light-headedness. Negative for tremors, seizures, syncope, numbness and headaches.       History of poliomyelitis at age 68. History of CVA.09/21/15.  Hematological: Negative for adenopathy. Does not bruise/bleed easily.  Psychiatric/Behavioral: Positive for confusion and decreased concentration. Negative for agitation, behavioral problems, dysphoric mood, hallucinations, sleep disturbance and suicidal ideas. The patient is not nervous/anxious and is not hyperactive.     Immunization History  Administered Date(s) Administered  . Hep A / Hep B 06/08/2013, 06/15/2013, 06/29/2013, 06/12/2014  . Influenza Split 01/01/2014  . Influenza Whole 01/17/2009, 12/02/2010  . Influenza-Unspecified 12/22/2013, 12/21/2014, 01/04/2016  . Pneumococcal  Conjugate-13 01/27/2015  . Pneumococcal Polysaccharide-23 07/25/2009  . Td 07/25/2009  . Tdap 12/22/2011   Pertinent  Health Maintenance Due  Topic Date Due  . MAMMOGRAM  07/18/2015  . INFLUENZA VACCINE  Completed  . DEXA SCAN  Completed  . PNA vac Low Risk Adult  Completed   Fall Risk  02/05/2016 11/02/2015 05/01/2015 01/23/2015 07/20/2014  Falls in the past year? Yes No No No No  Number falls in past yr: 1 - - - -  Injury with Fall? No - - - -   Functional Status Survey:    Vitals:   04/18/16 1334  BP: 124/88  Pulse: 65  Resp: 18  Temp: 97.5 F (36.4 C)  Weight: 116 lb (52.6 kg)  Height: 5\' 1"  (1.549 m)   Body mass index is 21.92 kg/m. Physical Exam  Constitutional: She is oriented to person, place, and time. She appears well-developed and well-nourished. No distress.  HENT:  Right Ear: External ear normal.  Left Ear: External ear normal.  Nose: Nose normal.  Mouth/Throat: Oropharynx is clear and moist. No oropharyngeal exudate.  Wearing hearing aids bilaterally. EAC and TM normal bilaterally.    Eyes: Conjunctivae and EOM are normal. Pupils are equal, round, and reactive to light. No scleral icterus.  Prescription lenses  Neck: No JVD present. No tracheal deviation present. No thyromegaly present.  Cardiovascular: Normal rate, regular rhythm and intact distal pulses.  Exam reveals no gallop and no friction rub.   Murmur heard. Pulmonary/Chest: Effort normal. No respiratory distress. She has no wheezes. She has no rales. She exhibits no tenderness.  Abdominal: Soft. Bowel sounds are normal. She exhibits no distension and no mass. There is no tenderness.  Large diastasis recti  Musculoskeletal: Normal range of motion. She exhibits edema and tenderness.  Kyphosis. Miild gait instability. Right wrist fx, short arm cast, swelling the right fingers. Right arm in a short cast  Lymphadenopathy:    She has no cervical adenopathy.  Neurological: She is alert and oriented to person, place, and time. No cranial nerve deficit. She exhibits abnormal muscle tone. Coordination abnormal.  Mild tremor of extended arms. Left sided weakness  Skin: No rash noted. She is not diaphoretic. No erythema. No pallor.  The right 2nd finger skin cut.   Psychiatric: She has a normal mood and affect. Her behavior is normal. Judgment and thought content normal.    Labs reviewed:  Recent Labs  11/28/15 0849  11/30/15 1941 12/01/15 0224  03/25/16 0508 03/26/16 0443 03/27/16 2230  NA 131*  < > 127* 132*  < > 133* 133* 130*  K 3.3*  < > 3.3* 3.0*  < > 4.1 3.6 3.8  CL 93*  < > 95* 98*  < > 99* 94* 89*  CO2 27  < > 23 27  < > 25 27 28   GLUCOSE 171*  < > 229* 102*  < > 146* 171* 154*  BUN 14  < > 14 12  < > 26* 18 19  CREATININE 0.93  < > 0.97 0.95  < > 1.26* 1.11* 1.09*  CALCIUM 8.4*  < > 8.1* 8.2*  < > 9.1 9.4 11.3*  MG 2.6*  --  1.8 1.8  --   --   --   --   < > = values in this interval not displayed.  Recent Labs  11/26/15 1013 11/27/15 0243 12/12/15 03/13/16 03/23/16 1750  AST 21 18 13 14 28    ALT 20 16 14  14  18  ALKPHOS 64 57 68 60 75  BILITOT 0.8 0.9  --   --  0.8  PROT 7.3 6.1*  --   --  7.9  ALBUMIN 4.0 3.4*  --   --  3.7    Recent Labs  11/26/15 1013  03/23/16 1750  03/25/16 0508 03/26/16 0443 03/27/16 2230  WBC 13.3*  < > 10.4  < > 6.0 6.3 9.1  NEUTROABS 11.3*  --  8.6*  --   --   --  7.1  HGB 13.4  < > 14.1  < > 11.7* 13.0 13.9  HCT 39.1  < > 42.0  < > 35.6* 38.3 40.1  MCV 88.1  < > 93.3  < > 92.5 91.4 90.3  PLT 209  < > 187  < > 165 182 206  < > = values in this interval not displayed. Lab Results  Component Value Date   TSH 1.573 03/24/2016   Lab Results  Component Value Date   HGBA1C 6.3 (H) 03/24/2016   Lab Results  Component Value Date   CHOL 175 03/24/2016   HDL 66 03/24/2016   LDLCALC 78 03/24/2016   TRIG 154 (H) 03/24/2016   CHOLHDL 2.7 03/24/2016    Significant Diagnostic Results in last 30 days:  Dg Chest 2 View  Result Date: 03/27/2016 CLINICAL DATA:  Decreased level of alertness today.  Recent stroke. EXAM: CHEST  2 VIEW COMPARISON:  03/23/2016 FINDINGS: Moderate hyperinflation and mild cardiomegaly, unchanged. The lungs are clear. The pulmonary vasculature is normal. There is no pleural effusion. Hilar, mediastinal and cardiac contours are unremarkable and unchanged. IMPRESSION: Hyperinflation and cardiomegaly.  No acute findings. Electronically Signed   By: Andreas Newport M.D.   On: 03/27/2016 23:01   Dg Chest 2 View  Result Date: 03/23/2016 CLINICAL DATA:  Evaluate for pneumonia EXAM: CHEST  2 VIEW COMPARISON:  11/26/2015 FINDINGS: Normal mediastinum and cardiac silhouette. Normal pulmonary vasculature. No evidence of effusion, infiltrate, or pneumothorax. No acute bony abnormality. IMPRESSION: No acute cardiopulmonary process. Electronically Signed   By: Suzy Bouchard M.D.   On: 03/23/2016 19:16   Ct Head Wo Contrast  Result Date: 03/27/2016 CLINICAL DATA:  Recent stroke, less alert today since discharge EXAM: CT HEAD WITHOUT  CONTRAST TECHNIQUE: Contiguous axial images were obtained from the base of the skull through the vertex without intravenous contrast. COMPARISON:  MRI 03/24/2016, 03/23/2016, CT 03/23/2016 FINDINGS: Brain: No hemorrhage is visualized. Hypodensity in the right frontal parietal cortex and subcortical white matter again visualized, and likely corresponds to previously noted diffusion abnormality. Previously noted subcentimeter restricted diffusion in the right occipital lobe white matter, right basal ganglia and left parietal lobe are not well appreciated by CT. There is no focal mass or midline shift. There is atrophy present as well as cerebellar atrophy. The ventricles are similar in size and morphology. Mild periventricular white matter hypodensity consistent with small vessel disease. Vascular: No hyperdense vessels.  Carotid artery calcifications. Skull: Fluid in the mastoid air cells.  No skull fracture Sinuses/Orbits: Mucosal thickening in the ethmoid sinuses. No acute orbital abnormality. Other: None IMPRESSION: 1. Hypodensity in the right frontal parietal cortex and subcortical white matter is felt to correspond to known ischemic changes on recent MRI of the brain. The additional ischemic foci noted on the prior MRI are not well appreciated on today's CT. There is no hemorrhage identified. 2. Small mastoid effusions. Electronically Signed   By: Donavan Foil M.D.   On: 03/27/2016 23:26  Mr Virgel Paling [id number] Contrast  Result Date: 03/24/2016 CLINICAL DATA:  76 year old female with acute or subacute right MCA territory infarcts discovered on brain MRI performed for confusion and left side weakness. History of Severe intracranial stenoses. Initial encounter. EXAM: MRA HEAD WITHOUT CONTRAST TECHNIQUE: Angiographic images of the Circle of Willis were obtained using MRA technique without intravenous contrast. COMPARISON:  Brain MRI 03/23/2016. CTA head and neck 09/24/2015. Intracranial MRA 09/22/2015. FINDINGS: The  previous MRA was motion degraded. The findings will be compared to the prior CTA. Antegrade flow in the posterior circulation with stable appearing distal vertebral arteries and basilar artery. No vertebrobasilar stenosis. PCA origins are within normal limits. Bilateral PCA branches are within normal limits. Preserved antegrade flow in both ICA siphons. No right siphon stenosis. Severe supraclinoid left ICA siphon stenosis appears stable from the prior CTA (series 406, image 6) and the left ICA terminus remains patent. Mild irregularity and stenosis at the left ACA origin appears stable. MCA and right ACA origins are stable and within normal limits. Visualized bilateral ACA branches appear stable, mild irregularity and stenosis on the left. Visualized left MCA branches appear stable with mild irregularity. The left MCA bifurcation remains patent. Severe distal right M1 stenosis appears stable (series 406, image 9). Right MCA bifurcation remains patent. Distal MCA flow signal appears fairly stable today, although the right MCA branches do appear somewhat more regular. IMPRESSION: Negative for emergent large vessel occlusion. Widespread anterior circulation atherosclerosis, with relative sparing of the posterior circulation. No significant change suspected in severe anterior circulation stenoses since The CTA on 09/24/2015 (please see also that report): Severe stenoses of the left ICA supraclinoid segment and right MCA distal M1 segment. Electronically Signed   By: Genevie Ann M.D.   On: 03/24/2016 10:03   Mr Brain Wo Contrast  Result Date: 03/23/2016 CLINICAL DATA:  Dysphasia, LEFT-sided weakness beginning yesterday, worsening today. Confusion and lethargy. Hypertensive. Recent infection. History of hypertension, hyperlipidemia, polio. EXAM: MRI HEAD WITHOUT CONTRAST TECHNIQUE: Multiplanar, multiecho pulse sequences of the brain and surrounding structures were obtained without intravenous contrast. COMPARISON:  CT  HEAD March 23, 2016 at 6 p.m. and MRI of the head September 22, 2015 FINDINGS: Sequences are moderately or severely motion degraded. BRAIN: Subcentimeter reduced diffusion RIGHT occipital lobe periventricular white matter, RIGHT basal ganglia. Patchy reduced diffusion RIGHT frontoparietal lobes, punctate reduced diffusion LEFT parietal lobe. Due to motion, limited assessment on ADC map. No susceptibility artifact to suggest large hemorrhage, limited assessment due to patient motion. Moderate ventriculomegaly on the basis of global parenchymal brain volume loss. Patchy supratentorial white matter FLAIR T2 hyperintensities. No abnormal extra-axial fluid collections. VASCULAR: Major intracranial vascular flow voids present at skull base. SKULL AND UPPER CERVICAL SPINE: Probable partially empty sella. No suspicious calvarial bone marrow signal. Craniocervical junction maintained. SINUSES/ORBITS: Bilateral mastoid effusions. Status post bilateral ocular lens implants. OTHER: None. IMPRESSION: Moderate to severely motion degraded examination. Acute versus subacute multi territorial infarcts including RIGHT MCA territory, most consistent with embolic phenomena. Electronically Signed   By: Elon Alas M.D.   On: 03/23/2016 23:42   Ct Head Code Stroke W/o Cm  Result Date: 03/23/2016 CLINICAL DATA:  Code stroke. Right-sided weakness. Last seen normal 3 hours ago. EXAM: CT HEAD WITHOUT CONTRAST TECHNIQUE: Contiguous axial images were obtained from the base of the skull through the vertex without intravenous contrast. COMPARISON:  12/21/2015 and multiple previous.  MRI 09/21/2015. FINDINGS: Brain: There is old infarction in the right parietal cortical and subcortical brain.  No sign of acute infarction, mass lesion, hemorrhage, hydrocephalus or extra-axial collection. Mild small vessel changes of the white matter. Vascular: There is atherosclerotic calcification of the major vessels at the base of the brain. Skull:  Negative Sinuses/Orbits: Sinuses are clear. Small mastoid effusions. Orbits negative. Other: None ASPECTS ([place] Stroke Program Early CT Score) - Ganglionic level infarction (caudate, lentiform nuclei, internal capsule, insula, M1-M3 cortex): 6 - Supraganglionic infarction (M4-M6 cortex): 3 Total score (0-10 with 10 being normal): 9 IMPRESSION: 1. No acute infarction suspected by CT. Old infarction in the right posterior parietal cortical and subcortical brain. 2. ASPECTS is 9, though the abnormality in M 6 is likely chronic. These results were paged by telephone at the time of interpretation on 03/23/2016 at 6:04 pm to Dr. Cheral Marker, awaiting response. Electronically Signed   By: Nelson Chimes M.D.   On: 03/23/2016 18:06    Assessment/Plan Paroxysmal atrial fibrillation (HCC) Heart rate is in controlled, continue Metoprolol and Diltiazem. Continue Eliquis.     Essential hypertension Continue Metoprolol 50mg , Diltiazem 90mg  bid  CVA (cerebral vascular accident) ([place]) MRI; notable for multiple acute vs subacute infarcts in right MCA territory suggestive of embolic etiology  -found on tele to have a fib so TEE cancelled-- eliquis started -keep ASA 81mg  due to intracranial stenosis     Ulcerative colitis ([place]) Left-sided colitis, in remission, on lialda, diagnosed 2006 Continue Lialda.     GERD (gastroesophageal reflux disease) Stable, continue Ranitidine.     Urinary frequency Persists, [name] 25mg  daily. Observe the patient.     Polymyalgia rheumatica (HCC) Managed with Prednisone 5mg  daily     Family/ staff Communication: SNF, dc'd Atorvastatin, Claritin, MVI, simplify meds per POA's request.   Labs/tests ordered:  none

## 2016-04-18 NOTE — Assessment & Plan Note (Signed)
Continue Metoprolol 50mg , Diltiazem 90mg  bid

## 2016-04-18 NOTE — Assessment & Plan Note (Signed)
Stable, continue Ranitidine.  

## 2016-04-22 NOTE — Progress Notes (Signed)
CARDIOLOGY OFFICE NOTE  Date:  04/23/2016    Tammy Beard Date of Birth: [birth year] Medical Record [id number]  PCP:  Jeanmarie Hubert, MD  Cardiologist:  [facility]  Chief Complaint  Patient presents with  . Atrial Fibrillation    Post hospital visit - seen for Dr. Curt Bears    History of Present Illness: Tammy Beard is a 76 y.o. female who presents today for a post hospital visit. Seen for Dr. Curt Bears.   She has a history of CVA, ulcerative colitis, polymyalgia rheumatica, hypertension, and GERD.   Taken earlier this month to the ER from the nursing home for evaluation of altered mental status and acute left-sided weakness. Patient had had a series of recent illnesses, including UTI treated at her nursing facility, and fracture of the right radius. MRI notable for multiple acute vs subacute infarcts in the right MCA territory - suggestive of embolic etiology. Found to have AF while admitted - so eliquis was started. Treated for a UTI.   Back in the ER after that discharge (same day) with HTN. She had not received her medicines. Given some IV fluids and sent back to the SNF.   Comes in today. Here with her brother Tammy Beard. Her father is 83 and actually helps with her care also along with another sibling.  Never married. No children. She is in a wheelchair. Very hard of hearing. She is at [facility]. Brother notes she sleeps a lot. Trying to start some rehab. Basically wheelchair bound. She is not walking. No real complaints. No palpitations. No chest pain Breathing ok. Not exerting herself.   Past Medical History:  Diagnosis Date  . Colitis   . Ecchymosis 11/15/2015  . Fracture of right radius 02/2016  . GERD (gastroesophageal reflux disease)   . Heart murmur   . Hyperlipidemia   . Hypertension   . Osteopenia    s/p 7 yrs Fosamax  . Polio   . Polymyalgia ([place])   . Rheumatic fever   . Sleep disorder   . Stroke ([place])   . Ulcerative colitis     Past Surgical  History:  Procedure Laterality Date  . BREAST LUMPECTOMY  1986   left; benign  . CATARACT EXTRACTION     Left eye   . COLONOSCOPY  2006  . Colonosopy  07/05/13   Dr. Deatra Ina  . DILATION AND CURETTAGE OF UTERUS  12/1999  . MELANOMA EXCISION  2007   left arm  . NECK SURGERY  1986   Benign growth on neck removed  . TONSILLECTOMY       Medications: Current Outpatient Prescriptions  Medication Sig Dispense Refill  . acetaminophen (TYLENOL) 325 MG tablet Take 650 mg by mouth every 6 (six) hours as needed.    Marland Kitchen apixaban (ELIQUIS) 5 MG TABS tablet Take 1 tablet (5 mg total) by mouth 2 (two) times daily. 60 tablet   . aspirin EC 81 MG EC tablet Take 1 tablet (81 mg total) by mouth daily.    . Cranberry-Vitamin C-Inulin (UTI-STAT) LIQD Take 30 mLs by mouth 2 (two) times daily.    Marland Kitchen diltiazem (CARDIZEM) 90 MG tablet Take 90 mg by mouth. Take one tablet every 12 hours. Hold if BP is less than 130/90    . lactose free nutrition (BOOST) LIQD Take 237 mLs by mouth every evening. Drink one can daily     . mesalamine (LIALDA) 1.2 g EC tablet take 2 tablets by mouth daily with BREAKFAST  60 tablet 6  . metoprolol (LOPRESSOR) 50 MG tablet Take 0.5 tablets (25 mg total) by mouth 2 (two) times daily.    . mirabegron ER (MYRBETRIQ) 25 MG TB24 tablet Take 25 mg by mouth daily.    . predniSONE (DELTASONE) 5 MG tablet Take 2 tablets (10 mg total) by mouth daily with breakfast. TWO TABLETs DAILY TO TREAT POLYMYALGIA RHEUMATICA    . ranitidine (ZANTAC) 150 MG tablet Take 150 mg by mouth at bedtime. Take one tablet at bedtime     . senna (SENOKOT) 8.6 MG tablet Take 1 tablet by mouth 3 (three) times daily.    Marland Kitchen zinc oxide (RA ZINC OXIDE) 20 % ointment Apply 1 application topically. Apply as needed     No current facility-administered medications for this visit.     Allergies: No Known Allergies  Social History: The patient  reports that she has never smoked. She has never used smokeless tobacco. She  reports that she drinks alcohol. She reports that she does not use drugs.   Family History: The patient's family history includes Cancer in her paternal aunt; Diabetes in her paternal grandmother; Heart failure in her paternal grandmother; Other in her mother; Pancreatic cancer in her other.   Review of Systems: Please see the history of present illness.   Otherwise, the review of systems is positive for none.   All other systems are reviewed and negative.   Physical Exam: VS:  BP 110/70   Pulse (!) 118   Ht 5\' 1"  (1.549 m)   Wt 107 lb (48.5 kg)   BMI 20.22 kg/m  .  BMI Body mass index is 20.22 kg/m.  Wt Readings from Last 3 Encounters:  04/23/16 107 lb (48.5 kg)  04/18/16 116 lb (52.6 kg)  04/03/16 117 lb (53.1 kg)    General: Elderly female. Chronically ill. Very hard of hearing. In a wheelchair. She was able to stand.  HEENT: Normal.  Neck: Supple, no JVD, carotid bruits, or masses noted.  Cardiac: Irregular irregular rhythm. Rate a little fast today. No edema.  Respiratory:  Lungs are clear to auscultation bilaterally with normal work of breathing.  GI: Soft and nontender.  MS: No deformity or atrophy. Gait not tested. Splint on the right arm.  Skin: Warm and dry. Color is normal.  Neuro:  Strength and sensation are intact and no gross focal deficits noted.  Psych: Alert, appropriate and with normal affect.   LABORATORY DATA:  EKG:  EKG is not ordered today.  Lab Results  Component Value Date   WBC 9.1 03/27/2016   HGB 13.9 03/27/2016   HCT 40.1 03/27/2016   PLT 206 03/27/2016   GLUCOSE 154 (H) 03/27/2016   CHOL 175 03/24/2016   TRIG 154 (H) 03/24/2016   HDL 66 03/24/2016   LDLCALC 78 03/24/2016   ALT 18 03/23/2016   AST 28 03/23/2016   NA 130 (L) 03/27/2016   K 3.8 03/27/2016   CL 89 (L) 03/27/2016   CREATININE 1.09 (H) 03/27/2016   BUN 19 03/27/2016   CO2 28 03/27/2016   TSH 1.573 03/24/2016   INR 0.95 03/23/2016   HGBA1C 6.3 (H) 03/24/2016    BNP  (last 3 results) No results for input(s): BNP in the last 8760 hours.  ProBNP (last 3 results) No results for input(s): PROBNP in the last 8760 hours.   Other Studies Reviewed Today:  Echo Study Conclusions 03/2016  - Left ventricle: The cavity size was normal. Wall thickness was  increased in a pattern of moderate to severe LVH. There was mild   focal basal and mild asymmetric hypertrophy of the septum.   Systolic function was normal. The estimated ejection fraction was   in the range of 55% to 60%. There was dynamic obstruction at   rest, with a peak velocity of 120 cm/sec and a peak gradient of 6   mm Hg. Wall motion was normal; there were no regional wall motion   abnormalities. - Aortic valve: Moderately calcified annulus. Trileaflet; normal   thickness, mildly calcified leaflets. Valve area (VTI): 2.6 cm^2.   Valve area (Vmax): 2.47 cm^2. Valve area (Vmean): 2.47 cm^2. - Mitral valve: Moderately calcified annulus. There was systolic   anterior motion of the chordal structures.  Assessment/Plan:  1. Multiple acute vs subacute infarcts noted on MRI/CVA - on Eliquis and aspirin. Recheck labs today. Getting rehab. Will be slow course.   2. PAF - to manage with rate control and anticoagulation - labs today. Would like her HR monitored. She may need more beta blocker. I suspect she has not had all of her medicines today. Will see back to recheck.   3. Chronic anticoagulation - no falls since discharge but her safety going forward will be tenuous.   4. UTI  5. Hypertension - BP ok on current regimen.   6. Ulcerative colitis  7. Polymyalgia rheumatica - on chronic steroid therapy.   Current medicines are reviewed with the patient today.  The patient does not have concerns regarding medicines other than what has been noted above.  The following changes have been made:  See above.  Labs/ tests ordered today include:    Orders Placed This Encounter  Procedures  . Basic  metabolic panel  . CBC     Disposition:   FU with me in about 6 weeks.   Patient is agreeable to this plan and will call if any problems develop in the interim.   SignedTruitt Merle, NP  04/23/2016 11:13 AM  [facility] [place] [address] [place], [place]  [address] Phone: [phone] Fax: [phone]

## 2016-04-23 ENCOUNTER — Encounter: Payer: Self-pay | Admitting: Nurse Practitioner

## 2016-04-23 ENCOUNTER — Ambulatory Visit (INDEPENDENT_AMBULATORY_CARE_PROVIDER_SITE_OTHER): Payer: Medicare Other | Admitting: Nurse Practitioner

## 2016-04-23 VITALS — BP 110/70 | HR 118 | Ht 61.0 in | Wt 107.0 lb

## 2016-04-23 DIAGNOSIS — Z7901 Long term (current) use of anticoagulants: Secondary | ICD-10-CM

## 2016-04-23 DIAGNOSIS — I480 Paroxysmal atrial fibrillation: Secondary | ICD-10-CM

## 2016-04-23 DIAGNOSIS — I639 Cerebral infarction, unspecified: Secondary | ICD-10-CM

## 2016-04-23 DIAGNOSIS — Z79899 Other long term (current) drug therapy: Secondary | ICD-10-CM

## 2016-04-23 DIAGNOSIS — Z09 Encounter for follow-up examination after completed treatment for conditions other than malignant neoplasm: Secondary | ICD-10-CM | POA: Diagnosis not present

## 2016-04-23 DIAGNOSIS — I63511 Cerebral infarction due to unspecified occlusion or stenosis of right middle cerebral artery: Secondary | ICD-10-CM | POA: Diagnosis not present

## 2016-04-23 NOTE — Patient Instructions (Addendum)
We will be checking the following labs today - BMET and CBC   Medication Instructions:    Continue with your current medicines.     Testing/Procedures To Be Arranged:  N/A  Follow-Up:   See me in about 6 weeks.     Other Special Instructions:   N/A    If you need a refill on your cardiac medications before your next appointment, please call your pharmacy.   Call the [place] office at [phone] if you have any questions, problems or concerns.

## 2016-04-24 ENCOUNTER — Telehealth: Payer: Self-pay | Admitting: *Deleted

## 2016-04-24 LAB — CBC
Hematocrit: 34.4 % (ref 34.0–46.6)
Hemoglobin: 11.5 g/dL (ref 11.1–15.9)
MCH: 31.5 pg (ref 26.6–33.0)
MCHC: 33.4 g/dL (ref 31.5–35.7)
MCV: 94 fL (ref 79–97)
Platelets: 218 10*3/uL (ref 150–379)
RBC: 3.65 x10E6/uL — ABNORMAL LOW (ref 3.77–5.28)
RDW: 13.5 % (ref 12.3–15.4)
WBC: 15.8 10*3/uL — ABNORMAL HIGH (ref 3.4–10.8)

## 2016-04-24 LAB — BASIC METABOLIC PANEL
BUN/Creatinine Ratio: 28 (ref 12–28)
BUN: 28 mg/dL — ABNORMAL HIGH (ref 8–27)
CO2: 29 mmol/L (ref 18–29)
Calcium: 9.4 mg/dL (ref 8.7–10.3)
Chloride: 84 mmol/L — ABNORMAL LOW (ref 96–106)
Creatinine, Ser: 1 mg/dL (ref 0.57–1.00)
GFR calc Af Amer: 63 mL/min/{1.73_m2} (ref >59)
GFR calc non Af Amer: 55 mL/min/{1.73_m2} — ABNORMAL LOW (ref >59)
Glucose: 226 mg/dL — ABNORMAL HIGH (ref 65–99)
Potassium: 3.8 mmol/L (ref 3.5–5.2)
Sodium: 132 mmol/L — ABNORMAL LOW (ref 134–144)

## 2016-04-24 NOTE — Telephone Encounter (Signed)
S/w Christy at [facility] gave number for skilled nursing to fax @ [phone] pt's lab results.

## 2016-05-06 ENCOUNTER — Other Ambulatory Visit (HOSPITAL_COMMUNITY): Payer: Self-pay | Admitting: Internal Medicine

## 2016-05-06 DIAGNOSIS — R1319 Other dysphagia: Secondary | ICD-10-CM

## 2016-05-08 ENCOUNTER — Ambulatory Visit (HOSPITAL_COMMUNITY)
Admission: RE | Admit: 2016-05-08 | Discharge: 2016-05-08 | Disposition: A | Discharge status: Home / Self Care | Payer: No Typology Code available for payment source | Source: Ambulatory Visit | Attending: Internal Medicine | Admitting: Internal Medicine

## 2016-05-08 DIAGNOSIS — R1319 Other dysphagia: Secondary | ICD-10-CM | POA: Diagnosis not present

## 2016-05-08 DIAGNOSIS — I69391 Dysphagia following cerebral infarction: Secondary | ICD-10-CM | POA: Diagnosis not present

## 2016-05-16 DIAGNOSIS — N390 Urinary tract infection, site not specified: Secondary | ICD-10-CM | POA: Diagnosis not present

## 2016-05-16 DIAGNOSIS — N3946 Mixed incontinence: Secondary | ICD-10-CM | POA: Diagnosis not present

## 2016-05-20 ENCOUNTER — Non-Acute Institutional Stay (SKILLED_NURSING_FACILITY): Payer: Medicare Other | Admitting: Nurse Practitioner

## 2016-05-20 ENCOUNTER — Encounter: Payer: Self-pay | Admitting: Nurse Practitioner

## 2016-05-20 DIAGNOSIS — R350 Frequency of micturition: Secondary | ICD-10-CM | POA: Diagnosis not present

## 2016-05-20 DIAGNOSIS — K219 Gastro-esophageal reflux disease without esophagitis: Secondary | ICD-10-CM | POA: Diagnosis not present

## 2016-05-20 DIAGNOSIS — I10 Essential (primary) hypertension: Secondary | ICD-10-CM | POA: Diagnosis not present

## 2016-05-20 DIAGNOSIS — E871 Hypo-osmolality and hyponatremia: Secondary | ICD-10-CM | POA: Diagnosis not present

## 2016-05-20 DIAGNOSIS — D649 Anemia, unspecified: Secondary | ICD-10-CM | POA: Diagnosis not present

## 2016-05-20 DIAGNOSIS — I480 Paroxysmal atrial fibrillation: Secondary | ICD-10-CM

## 2016-05-20 DIAGNOSIS — N3000 Acute cystitis without hematuria: Secondary | ICD-10-CM | POA: Diagnosis not present

## 2016-05-20 DIAGNOSIS — M353 Polymyalgia rheumatica: Secondary | ICD-10-CM | POA: Diagnosis not present

## 2016-05-20 NOTE — Assessment & Plan Note (Signed)
05/19/16 urology resume TMP while completing Nitrofurantoin 100mg  bid x 7 days.

## 2016-05-20 NOTE — Assessment & Plan Note (Signed)
Continue Metoprolol 50mg  bid, Diltiazem 90mg  bid

## 2016-05-20 NOTE — Assessment & Plan Note (Signed)
Persists, [name] 25mg daily. Observe the patient.  

## 2016-05-20 NOTE — Assessment & Plan Note (Signed)
Stable, continue Ranitidine.  

## 2016-05-20 NOTE — Assessment & Plan Note (Signed)
Stable, 04/23/16 Na 132

## 2016-05-20 NOTE — Progress Notes (Signed)
[facility] Room Number: 69 Place of Service:  SNF (31) Provider:  Sadia Belfiore, Manxie  NP  Jeanmarie Hubert, MD  Patient Care Team: [name], MD as PCP - General (Internal Medicine) [name], MD as Consulting Physician (Cardiology) [name], MD as Consulting Physician (Ophthalmology) [name], MD as Consulting Physician (Gastroenterology) [name], MD as Consulting Physician (Dermatology)  Extended Emergency Contact Information Primary Emergency Contact: [name]) Address: [place],  [address] [name] of [place] Phone: [phone] Mobile Phone: [phone] Relation: Brother Secondary Emergency Contact: [name] Phone: [phone] Relation: Sister  Code Status:  DNR Goals of care: Advanced Directive information Advanced Directives 05/20/2016  Does Patient Have a Medical Advance Directive? Yes  Type of Paramedic of [place];Living will;Out of facility DNR (pink MOST or yellow form)  Does patient want to make changes to medical advance directive? No - Patient declined  Copy of [place] in Chart? Yes  Pre-existing out of facility DNR order (yellow form or pink MOST form) Yellow form placed in chart (order not valid for inpatient use)     Chief Complaint  Patient presents with  . Medical Management of Chronic Issues    HPI:  Pt is a 76 y.o. female seen today for medical management of chronic diseases.    Hx of Afib, takign Meotprolol, Cardizem,  Eliquis, urinary frequency, taking Myrbetriq, Ulcerative colitis, stable, off Mesalamine. Polymyalgia rheumatica is stable, taking Prednisone 10mg  daily. Taking MTP for UTI suppression therapy. Taking Senokot tid for constipation. Taking Ranitidine for GERD   Past Medical History:  Diagnosis Date  . Colitis   . Ecchymosis 11/15/2015  .  Fracture of right radius 02/2016  . GERD (gastroesophageal reflux disease)   . Heart murmur   . Hyperlipidemia   . Hypertension   . Osteopenia    s/p 7 yrs Fosamax  . Polio   . Polymyalgia ([place])   . Rheumatic fever   . Sleep disorder   . Stroke ([place])   . Ulcerative colitis    Past Surgical History:  Procedure Laterality Date  . BREAST LUMPECTOMY  1986   left; benign  . CATARACT EXTRACTION     Left eye   . COLONOSCOPY  2006  . Colonosopy  07/05/13   Dr. Deatra Ina  . DILATION AND CURETTAGE OF UTERUS  12/1999  . MELANOMA EXCISION  2007   left arm  . NECK SURGERY  1986   Benign growth on neck removed  . TONSILLECTOMY      No Known Allergies  Allergies as of 05/20/2016   No Known Allergies     Medication List       Accurate as of 05/20/16  2:07 PM. Always use your most recent med list.          acetaminophen 325 MG tablet Commonly known as:  TYLENOL Take 650 mg by mouth every 6 (six) hours as needed.   apixaban 5 MG Tabs tablet Commonly known as:  ELIQUIS Take 1 tablet (5 mg total) by mouth 2 (two) times daily.   aspirin 81 MG EC tablet Take 1 tablet (81 mg total) by mouth daily.   diltiazem 90 MG tablet Commonly known as:  CARDIZEM Take 90 mg by mouth. Take one tablet every 12 hours. Hold if BP is less than 130/90   lactose free nutrition  Liqd Take 237 mLs by mouth every evening. Drink one can daily   metoprolol 50 MG tablet Commonly known as:  LOPRESSOR Take 0.5 tablets (25 mg total) by mouth 2 (two) times daily.   MYRBETRIQ 25 MG Tb24 tablet Generic drug:  mirabegron ER Take 25 mg by mouth daily.   nitrofurantoin 100 MG capsule Commonly known as:  MACRODANTIN Take 100 mg by mouth 2 (two) times daily.   predniSONE 5 MG tablet Commonly known as:  DELTASONE Take 2 tablets (10 mg total) by mouth daily with breakfast. TWO TABLETs DAILY TO TREAT POLYMYALGIA RHEUMATICA   RA ZINC OXIDE 20 % ointment Generic drug:  zinc oxide Apply 1 application  topically. Apply as needed   ranitidine 150 MG tablet Commonly known as:  ZANTAC Take 150 mg by mouth at bedtime. Take one tablet at bedtime   senna 8.6 MG tablet Commonly known as:  SENOKOT Take 1 tablet by mouth 3 (three) times daily.   UTI-STAT Liqd Take 30 mLs by mouth 2 (two) times daily.       Review of Systems  Constitutional: Negative for activity change, appetite change, chills, diaphoresis, fatigue, fever and unexpected weight change.  HENT: Negative for congestion, ear discharge, ear pain, hearing loss, postnasal drip, rhinorrhea, sore throat, tinnitus, trouble swallowing and voice change.   Eyes: Positive for visual disturbance (corrective lenses). Negative for pain, redness and itching.       Old CVA  Respiratory: Negative for cough, choking, shortness of breath and wheezing.   Cardiovascular: Negative for chest pain, palpitations and leg swelling.  Gastrointestinal: Positive for diarrhea, nausea and vomiting. Negative for abdominal distention, abdominal pain and constipation.       Hx ulcerative colitis; on Lialda. Hx GERD, currently asymptomatic.  Endocrine: Negative for cold intolerance, heat intolerance, polydipsia, polyphagia and polyuria.       History of elevated glucose.  Genitourinary: Positive for frequency. Negative for difficulty urinating, dysuria, flank pain, hematuria, pelvic pain, urgency and vaginal discharge.       Nocturia. Recurrent UTI.  Musculoskeletal: Positive for back pain and gait problem. Negative for arthralgias, myalgias, neck pain and neck stiffness.       Hx PMR and chronic use of prednisone. History of osteopenia and kyphosis. 03/10/16- fracture of the distal right radius. In forearm cast. Complains of weakness and discomfort in the low back and increased problems getting out of her recliner chair.  Skin: Negative for color change, pallor and rash.       The right 2nd finger skin cut.   Allergic/Immunologic: Negative.   Neurological:  Positive for weakness (left hemiparesis affecting the left leg mainly) and light-headedness. Negative for dizziness, tremors, seizures, syncope, numbness and headaches.       History of poliomyelitis at age 45. History of CVA.09/21/15, 03/23/16. Encephalopathy during last hospital stay .  Hematological: Negative for adenopathy. Does not bruise/bleed easily.  Psychiatric/Behavioral: Positive for confusion, decreased concentration and dysphoric mood. Negative for agitation, behavioral problems, hallucinations, sleep disturbance and suicidal ideas. The patient is nervous/anxious. The patient is not hyperactive.     Immunization History  Administered Date(s) Administered  . Hep A / Hep B 06/08/2013, 06/15/2013, 06/29/2013, 06/12/2014  . Influenza Split 01/01/2014  . Influenza Whole 01/17/2009, 12/02/2010  . Influenza-Unspecified 12/22/2013, 12/21/2014, 01/04/2016  . Pneumococcal Conjugate-13 01/27/2015  . Pneumococcal Polysaccharide-23 07/25/2009  . Td 07/25/2009  . Tdap 12/22/2011   Pertinent  Health Maintenance Due  Topic Date Due  . MAMMOGRAM  07/18/2015  .  INFLUENZA VACCINE  Completed  . DEXA SCAN  Completed  . PNA vac Low Risk Adult  Completed   Fall Risk  02/05/2016 11/02/2015 05/01/2015 01/23/2015 07/20/2014  Falls in the past year? Yes No No No No  Number falls in past yr: 1 - - - -  Injury with Fall? No - - - -   Functional Status Survey:    Vitals:   05/20/16 1125  BP: (!) 138/94  Pulse: 80  Resp: 20  Temp: 97.6 F (36.4 C)  SpO2: 98%  Weight: 114 lb 12.8 oz (52.1 kg)  Height: 5\' 1"  (1.549 m)   Body mass index is 21.69 kg/m. Physical Exam  Labs reviewed:  Recent Labs  11/28/15 0849  11/30/15 1941 12/01/15 0224  03/26/16 0443 03/27/16 2230 04/23/16 1127  NA 131*  < > 127* 132*  < > 133* 130* 132*  K 3.3*  < > 3.3* 3.0*  < > 3.6 3.8 3.8  CL 93*  < > 95* 98*  < > 94* 89* 84*  CO2 27  < > 23 27  < > 27 28 29   GLUCOSE 171*  < > 229* 102*  < > 171* 154* 226*    BUN 14  < > 14 12  < > 18 19 28*  CREATININE 0.93  < > 0.97 0.95  < > 1.11* 1.09* 1.00  CALCIUM 8.4*  < > 8.1* 8.2*  < > 9.4 11.3* 9.4  MG 2.6*  --  1.8 1.8  --   --   --   --   < > = values in this interval not displayed.  Recent Labs  11/26/15 1013 11/27/15 0243 12/12/15 03/13/16 03/23/16 1750  AST 21 18 13 14 28   ALT 20 16 14 14 18   ALKPHOS 64 57 68 60 75  BILITOT 0.8 0.9  --   --  0.8  PROT 7.3 6.1*  --   --  7.9  ALBUMIN 4.0 3.4*  --   --  3.7    Recent Labs  11/26/15 1013  03/23/16 1750  03/25/16 0508 03/26/16 0443 03/27/16 2230 04/23/16 1127  WBC 13.3*  < > 10.4  < > 6.0 6.3 9.1 15.8*  NEUTROABS 11.3*  --  8.6*  --   --   --  7.1  --   HGB 13.4  < > 14.1  < > 11.7* 13.0 13.9  --   HCT 39.1  < > 42.0  < > 35.6* 38.3 40.1 34.4  MCV 88.1  < > 93.3  < > 92.5 91.4 90.3 94  PLT 209  < > 187  < > 165 182 206 218  < > = values in this interval not displayed. Lab Results  Component Value Date   TSH 1.573 03/24/2016   Lab Results  Component Value Date   HGBA1C 6.3 (H) 03/24/2016   Lab Results  Component Value Date   CHOL 175 03/24/2016   HDL 66 03/24/2016   LDLCALC 78 03/24/2016   TRIG 154 (H) 03/24/2016   CHOLHDL 2.7 03/24/2016    Significant Diagnostic Results in last 30 days:  Dg Op Swallowing Func-medicare/speech Path  Result Date: 05/08/2016 Objective Swallowing Evaluation: Type of Study: MBS-Modified Barium Swallow Study Patient Details Name: KATTLEYA GEESLIN MRN: [id number] Date of Birth: Jan 23, 1941 Today's Date: 05/08/2016 Time: SLP Start Time (ACUTE ONLY): 1115-SLP Stop Time (ACUTE ONLY): 1200 SLP Time Calculation (min) (ACUTE ONLY): 45 min Past Medical History: Past Medical History: Diagnosis  Date . Colitis  . Ecchymosis 11/15/2015 . Fracture of right radius 02/2016 . GERD (gastroesophageal reflux disease)  . Heart murmur  . Hyperlipidemia  . Hypertension  . Osteopenia   s/p 7 yrs Fosamax . Polio  . Polymyalgia ([place])  . Rheumatic fever  . Sleep disorder  .  Stroke ([place])  . Ulcerative colitis  Past Surgical History: Past Surgical History: Procedure Laterality Date . BREAST LUMPECTOMY  1986  left; benign . CATARACT EXTRACTION    Left eye  . COLONOSCOPY  2006 . Colonosopy  07/05/13  Dr. Deatra Ina . DILATION AND CURETTAGE OF UTERUS  12/1999 . MELANOMA EXCISION  2007  left arm . NECK SURGERY  1986  Benign growth on neck removed . TONSILLECTOMY   HPI: Pt is a 76 year old female arriving for an OP MBS. She has a history of CVA, ulcerative colitis, polymyalgia rheumatica, hypertension, and GERD. Patient hadhad a series of recent illnesses, including UTI treated at her nursing facility, and fracture of the right radius. MRI notable for multiple acute vs subacute infarcts in the right MCA territory - suggestive of embolic etiology.Pt seen by SLP in 12/17 during hosptial admit, no over s/s of aspiration, but noted that pt excessively talkative while eating, Voice quality was raspy and occasionally wet sounding, which sister reported as baseline, due to history of Polio.  Subjective: Pt resting in bed, very talkative. Sister present. Assessment / Plan / Recommendation CHL IP CLINICAL IMPRESSIONS 05/08/2016 Therapy Diagnosis Severe cervical esophageal phase dysphagia;Severe pharyngeal phase dysphagia Clinical Impression Pt demonstrates a  multifactorial dysphagia with sensorimotor and structural deficits that result in severe pharyngeal residuals and difficulty initiating the swallow. Multiple thin bolus were trialed but as study progressed to nectar thick and puree, pt began orally holding and couldnot initiate oral transit of bolus to base of tongue for swallow trigger despite max cueing and pt verbalization of understanding of directions. Chewable solids were not trialed. Oropharyngeal function characterized by severe pooling of liquids above the CP in somewhat distended pyriform sinuses. This residue frequently penetrated the vestibule and coasted below the cords post swallow  without sensation. Cued throat clears and second swallows were beneficial, but did not fully clear residue. Attempted a chin tuck without result, but a head turn left/right did aid in clearance, but only on one side of the pharynx, the side opposite of the turn retained severe residue. Cause of residuals appear multifactorial with possible neuromuscular weakness in pharyngeal constricotrs and hyoid excursion/UES relaxation from prior CVAs. However, primary source appears to be a chronic cervical esophageal dysphagia, related to bony protrusions at the UES, as well as a prominent CP and small Zenker's diverticulum. Hyoid excursion with most swallows is reduced, either due to weakness or resistance from CP. Thickened texures will not reduce residue and seem to increase pts struggle to initiate swallow. would not expect significant improvement in this impairment with exercise or modified textures. Pt likely to be at persistent risk of aspiration with all PO intake, but denies any recent pna. May benefit from ENT consult.  Impact on safety and function --   CHL IP TREATMENT RECOMMENDATION 05/08/2016 Treatment Recommendations Defer treatment plan to f/u with SLP   Prognosis 03/24/2016 Prognosis for Safe Diet Advancement Fair Barriers to Reach Goals Cognitive deficits Barriers/Prognosis Comment -- CHL IP DIET RECOMMENDATION 05/08/2016 SLP Diet Recommendations Thin liquid Liquid Administration via Cup;Straw Medication Administration Crushed with puree Compensations Clear throat after each swallow;Multiple dry swallows after each bite/sip Postural Changes Seated upright  at 90 degrees;Remain semi-upright after after feeds/meals (Comment)   CHL IP OTHER RECOMMENDATIONS 05/08/2016 Recommended Consults Consider ENT evaluation Oral Care Recommendations -- Other Recommendations --   CHL IP FOLLOW UP RECOMMENDATIONS 03/26/2016 Follow up Recommendations None   CHL IP FREQUENCY AND DURATION 03/24/2016 Speech Therapy Frequency (ACUTE ONLY)  min 1 x/week Treatment Duration 2 weeks      CHL IP ORAL PHASE 05/08/2016 Oral Phase Impaired Oral - Pudding Teaspoon -- Oral - Pudding Cup -- Oral - Honey Teaspoon -- Oral - Honey Cup -- Oral - Nectar Teaspoon -- Oral - Nectar Cup Holding of bolus Oral - Nectar Straw -- Oral - Thin Teaspoon -- Oral - Thin Cup Delayed oral transit;Holding of bolus Oral - Thin Straw Holding of bolus;Delayed oral transit Oral - Puree Holding of bolus Oral - Mech Soft -- Oral - Regular -- Oral - Multi-Consistency -- Oral - Pill -- Oral Phase - Comment --  CHL IP PHARYNGEAL PHASE 05/08/2016 Pharyngeal Phase Impaired Pharyngeal- Pudding Teaspoon -- Pharyngeal -- Pharyngeal- Pudding Cup -- Pharyngeal -- Pharyngeal- Honey Teaspoon -- Pharyngeal -- Pharyngeal- Honey Cup -- Pharyngeal -- Pharyngeal- Nectar Teaspoon -- Pharyngeal -- Pharyngeal- Nectar Cup Pharyngeal residue - pyriform;Penetration/Apiration after swallow;Trace aspiration;Pharyngeal residue - cp segment;Reduced anterior laryngeal mobility Pharyngeal Material enters airway, passes BELOW cords without attempt by patient to eject out (silent aspiration);Material enters airway, CONTACTS cords and not ejected out Pharyngeal- Nectar Straw -- Pharyngeal -- Pharyngeal- Thin Teaspoon -- Pharyngeal -- Pharyngeal- Thin Cup Pharyngeal residue - pyriform;Penetration/Apiration after swallow;Trace aspiration;Pharyngeal residue - cp segment;Reduced anterior laryngeal mobility;Reduced pharyngeal peristalsis Pharyngeal Material enters airway, passes BELOW cords without attempt by patient to eject out (silent aspiration);Material enters airway, CONTACTS cords and not ejected out Pharyngeal- Thin Straw -- Pharyngeal -- Pharyngeal- Puree NT Pharyngeal -- Pharyngeal- Mechanical Soft -- Pharyngeal -- Pharyngeal- Regular -- Pharyngeal -- Pharyngeal- Multi-consistency -- Pharyngeal -- Pharyngeal- Pill -- Pharyngeal -- Pharyngeal Comment --  CHL IP CERVICAL ESOPHAGEAL PHASE 05/08/2016 Cervical Esophageal  Phase Impaired Pudding Teaspoon -- Pudding Cup -- Honey Teaspoon -- Honey Cup -- Nectar Teaspoon -- Nectar Cup -- Nectar Straw -- Thin Teaspoon -- Thin Cup Prominent cricopharyngeal segment;Reduced cricopharyngeal relaxation Thin Straw -- Puree -- Mechanical Soft -- Regular -- Multi-consistency -- Pill -- Cervical Esophageal Comment -- CHL IP GO 05/08/2016 Functional Assessment Tool Used (No Data) Functional Limitations Swallowing Swallow Current Status [id number]) CL Swallow Goal Status [id number]) CL Swallow Discharge Status [id number]) CL Motor Speech Current Status [id number]) (None) Motor Speech Goal Status [id number]) (None) Motor Speech Goal Status [id number]) (None) Spoken Language Comprehension Current Status [id number]) (None) Spoken Language Comprehension Goal Status [id number]) (None) Spoken Language Comprehension Discharge Status [id number]) (None) Spoken Language Expression Current Status [id number]) (None) Spoken Language Expression Goal Status [id number]) (None) Spoken Language Expression Discharge Status [phone]) (None) Attention Current Status [id number]) (None) Attention Goal Status [id number]) (None) Attention Discharge Status [id number]) (None) Memory Current Status [id number]) (None) Memory Goal Status [id number]) (None) Memory Discharge Status [id number]) (None) Voice Current Status [id number]) (None) Voice Goal Status [id number]) (None) Voice Discharge Status [id number]) (None) Other Speech-Language Pathology Functional Limitation [phone]) (None) Other Speech-Language Pathology Functional Limitation Goal Status [id number]) (None) Other Speech-Language Pathology Functional Limitation Discharge Status [phone]) (None) DeBlois, Katherene Ponto 05/08/2016, 4:16 PM            CLINICAL DATA:  76 year old female status post prior infarct with dysphagia. Initial encounter. EXAM: MODIFIED BARIUM SWALLOW TECHNIQUE: Different consistencies of barium were administered orally to the patient by the Speech Pathologist. Imaging  of the pharynx was performed in the lateral projection. FLUOROSCOPY  TIME:  Fluoroscopy Time:  5 minutes 1 second Radiation Exposure Index (if provided by the fluoroscopic device): Number of Acquired Spot Images: 0 COMPARISON:  Brain MRI 03/23/2016 FINDINGS: Thin liquid- premature spill and prominent retention in the piriform sinuses. Frequent penetration, which occasionally progressed to aspiration which was sometimes silent. No improvement with chin-tuck maneuvers. Superimposed poor relaxation of the cricopharyngeus muscle. There might be a developing small Zenker diverticulum at the C5 level directed posteriorly. Nectar thick liquid- delayed oral transit and swallow trigger. Otherwise similar findings to thin liquid. Honey- not administered Pure- delayed oral transit, and otherwise similar findings to thin liquid. Cracker-not administered Pure with cracker- not administered Barium tablet -  not administered IMPRESSION: Delayed oral transit, delayed swallow trigger, premature spill, and prominent retention in the piriform sinuses. Frequent penetration which occasionally progressed aspiration which was sometimes silent. Please refer to the Speech Pathologists report for complete details and recommendations. Electronically Signed   By: Genevie Ann M.D.   On: 05/08/2016 12:17    Assessment/Plan Essential hypertension Continue Metoprolol 50mg  bid, Diltiazem 90mg  bid  Paroxysmal atrial fibrillation (HCC) Heart rate is in controlled, continue Metoprolol and Diltiazem.   GERD (gastroesophageal reflux disease) Stable, continue Ranitidine.   Polymyalgia rheumatica (HCC) Managed with Prednisone 10mg  daily   Hyponatremia Stable, 04/23/16 Na 132  Anemia 03/27/16 Hgb 13.9  UTI (urinary tract infection) 05/19/16 urology resume TMP while completing Nitrofurantoin 100mg  bid x 7 days.   Urinary frequency Persists, [name] 25mg  daily. Observe the patient.       Family/ staff Communication: SNF  Labs/tests ordered:  none

## 2016-05-20 NOTE — Assessment & Plan Note (Signed)
Managed with Prednisone 10mg daily  

## 2016-05-20 NOTE — Assessment & Plan Note (Signed)
Heart rate is in controlled, continue Metoprolol and Diltiazem.

## 2016-05-20 NOTE — Assessment & Plan Note (Signed)
03/27/16 Hgb 13.9

## 2016-05-26 NOTE — Progress Notes (Signed)
CARDIOLOGY OFFICE NOTE  Date:  05/27/2016    Deetta Perla Date of Birth: [birth year] Medical Record [id number]  PCP:  Jeanmarie Hubert, MD  Cardiologist:  Katha Hamming    Chief Complaint  Patient presents with  . Atrial Fibrillation    Follow up visit - seen for Dr. Curt Bears    History of Present Illness: Tammy Beard is a 76 y.o. female who presents today for a follow up visit. Seen for Dr. Curt Bears.   She has a history of CVA, ulcerative colitis, polymyalgia rheumatica, hypertension, and GERD.   Taken back in January to the ER from the nursing home for evaluation of altered mental status and acute left-sided weakness. Patient had had a series of recent illnesses, including UTI treated at her nursing facility, and fracture of the right radius. MRI notable for multiple acute vs subacute infarcts in the right MCA territory - suggestive of embolic etiology. Found to have AF while admitted - so eliquis was started. Treated for a UTI.   Back in the ER after that discharge (same day) with HTN. She had not received her medicines. Given some IV fluids and sent back to the SNF.   I then saw her - she was here with her brother. Her father is 64 and actually helps with her care also along with another sibling.  Never married. No children. Basically wheelchair bound.   Comes in today. Here with her brother Laurey Arrow - but he has had to go out to call about the father who is apparently now ill. She remains wheelchair bound. Did not put her hearing aids in today.  She continues to just fall asleep - falls asleep here today. Looks like by the med list that her dose of metoprolol has been increased - has BP parameters. She is quite bradycardic here today. She tells me she has been dizzy. No falls. Not really doing anything. Pretty much in the bed or her chair. Barely standing. No chest pain. Says she does not hurt anywhere.   Past Medical History:  Diagnosis Date  . Colitis   .  Ecchymosis 11/15/2015  . Fracture of right radius 02/2016  . GERD (gastroesophageal reflux disease)   . Heart murmur   . Hyperlipidemia   . Hypertension   . Osteopenia    s/p 7 yrs Fosamax  . Polio   . Polymyalgia ([place])   . Rheumatic fever   . Sleep disorder   . Stroke ([place])   . Ulcerative colitis     Past Surgical History:  Procedure Laterality Date  . BREAST LUMPECTOMY  1986   left; benign  . CATARACT EXTRACTION     Left eye   . COLONOSCOPY  2006  . Colonosopy  07/05/13   Dr. Deatra Ina  . DILATION AND CURETTAGE OF UTERUS  12/1999  . MELANOMA EXCISION  2007   left arm  . NECK SURGERY  1986   Benign growth on neck removed  . TONSILLECTOMY       Medications: Current Outpatient Prescriptions  Medication Sig Dispense Refill  . acetaminophen (TYLENOL) 325 MG tablet Take 650 mg by mouth every 6 (six) hours as needed.    Marland Kitchen apixaban (ELIQUIS) 5 MG TABS tablet Take 1 tablet (5 mg total) by mouth 2 (two) times daily. 60 tablet   . aspirin EC 81 MG EC tablet Take 1 tablet (81 mg total) by mouth daily.    . Cranberry-Vitamin C-Inulin (UTI-STAT) LIQD Take 30  mLs by mouth 2 (two) times daily.    Marland Kitchen diltiazem (CARDIZEM) 90 MG tablet Take 90 mg by mouth. Take one tablet every 12 hours.     . lactose free nutrition (BOOST) LIQD Take 237 mLs by mouth every evening. Drink one can daily     . metoprolol (LOPRESSOR) 50 MG tablet Take 0.5 tablets (25 mg total) by mouth 2 (two) times daily.    . mirabegron ER (MYRBETRIQ) 25 MG TB24 tablet Take 25 mg by mouth daily.    . predniSONE (DELTASONE) 5 MG tablet Take 2 tablets (10 mg total) by mouth daily with breakfast. TWO TABLETs DAILY TO TREAT POLYMYALGIA RHEUMATICA    . ranitidine (ZANTAC) 150 MG tablet Take 150 mg by mouth at bedtime. Take one tablet at bedtime     . senna (SENOKOT) 8.6 MG tablet Take 1 tablet by mouth 3 (three) times daily.    Marland Kitchen trimethoprim (TRIMPEX) 100 MG tablet Take 100 mg by mouth daily.    Marland Kitchen zinc oxide 20 % ointment Apply 1  application topically as needed for irritation.    . nitrofurantoin (MACRODANTIN) 100 MG capsule Take 100 mg by mouth 2 (two) times daily.  0   No current facility-administered medications for this visit.     Allergies: No Known Allergies  Social History: The patient  reports that she has never smoked. She has never used smokeless tobacco. She reports that she drinks alcohol. She reports that she does not use drugs.   Family History: The patient's family history includes Cancer in her paternal aunt; Diabetes in her paternal grandmother; Heart failure in her paternal grandmother; Other in her mother; Pancreatic cancer in her other.   Review of Systems: Please see the history of present illness.   Otherwise, the review of systems is positive for none.   All other systems are reviewed and negative.   Physical Exam: VS:  BP 120/70   Pulse (!) 53   Ht 5\' 1"  (1.549 m)   Wt 105 lb 6.4 oz (47.8 kg)   BMI 19.92 kg/m  .  BMI Body mass index is 19.92 kg/m.  Wt Readings from Last 3 Encounters:  05/27/16 105 lb 6.4 oz (47.8 kg)  05/20/16 114 lb 12.8 oz (52.1 kg)  04/23/16 107 lb (48.5 kg)    General: Pleasant. She is chronically ill. She is alert and in no acute distress.  She is very hard of hearing. Looks frail.  HEENT: Normal.  Neck: Supple, no JVD, carotid bruits, or masses noted.  Cardiac: Regular rate and rhythm.  No edema.  Respiratory:  Lungs are clear to auscultation bilaterally with normal work of breathing.  GI: Soft and nontender.  MS: No deformity or atrophy. Gait not tested Skin: Warm and dry. Color is normal.  Neuro:  Strength and sensation are intact and no gross focal deficits noted.  Psych: Alert, appropriate and with normal affect.   LABORATORY DATA:  EKG:  EKG is ordered today. This shows sinus with bradycardia - rate of 53.  Lab Results  Component Value Date   WBC 15.8 (H) 04/23/2016   HGB 13.9 03/27/2016   HCT 34.4 04/23/2016   PLT 218 04/23/2016    GLUCOSE 226 (H) 04/23/2016   CHOL 175 03/24/2016   TRIG 154 (H) 03/24/2016   HDL 66 03/24/2016   LDLCALC 78 03/24/2016   ALT 18 03/23/2016   AST 28 03/23/2016   NA 132 (L) 04/23/2016   K 3.8 04/23/2016   CL  84 (L) 04/23/2016   CREATININE 1.00 04/23/2016   BUN 28 (H) 04/23/2016   CO2 29 04/23/2016   TSH 1.573 03/24/2016   INR 0.95 03/23/2016   HGBA1C 6.3 (H) 03/24/2016    BNP (last 3 results) No results for input(s): BNP in the last 8760 hours.  ProBNP (last 3 results) No results for input(s): PROBNP in the last 8760 hours.   Other Studies Reviewed Today:  Echo Study Conclusions 03/2016  - Left ventricle: The cavity size was normal. Wall thickness was increased in a pattern of moderate to severe LVH. There was mild focal basal and mild asymmetric hypertrophy of the septum. Systolic function was normal. The estimated ejection fraction was in the range of 55% to 60%. There was dynamic obstruction at rest, with a peak velocity of 120 cm/sec and a peak gradient of 6 mm Hg. Wall motion was normal; there were no regional wall motion abnormalities. - Aortic valve: Moderately calcified annulus. Trileaflet; normal thickness, mildly calcified leaflets. Valve area (VTI): 2.6 cm^2. Valve area (Vmax): 2.47 cm^2. Valve area (Vmean): 2.47 cm^2. - Mitral valve: Moderately calcified annulus. There was systolic anterior motion of the chordal structures.  Assessment/Plan:  1. Multiple acute vs subacute infarcts noted on MRI/CVA - on Eliquis and aspirin.  Getting rehab. This is a slow course.   2. PAF - to manage with rate control and anticoagulation - she is in sinus today - HR is 53 today. Will stop the BP parameters on her Lopressor and Diltiazem - cutting Lopressor back to the 25 mg BID dose as taking before. Will hold both agents for HR < 55.   3. Chronic anticoagulation - no falls since discharge but her safety going forward will be tenuous.   4. UTI -  chronic  5. Hypertension - BP ok on current regimen.   6. Ulcerative colitis  7. Polymyalgia rheumatica - on chronic steroid therapy.   Current medicines are reviewed with the patient today.  The patient does not have concerns regarding medicines other than what has been noted above.  The following changes have been made:  See above.  Labs/ tests ordered today include:   No orders of the defined types were placed in this encounter.    Disposition:   FU with me in 3 months. Overall situation quite tenuous at best.    Patient is agreeable to this plan and will call if any problems develop in the interim.   SignedTruitt Merle, NP  05/27/2016 12:16 PM  [facility] [address] [place], [place]  [address] Phone: [phone] Fax: [phone]

## 2016-05-27 ENCOUNTER — Telehealth: Payer: Self-pay | Admitting: *Deleted

## 2016-05-27 ENCOUNTER — Encounter: Payer: Self-pay | Admitting: Nurse Practitioner

## 2016-05-27 ENCOUNTER — Ambulatory Visit (INDEPENDENT_AMBULATORY_CARE_PROVIDER_SITE_OTHER): Payer: Medicare Other | Admitting: Nurse Practitioner

## 2016-05-27 VITALS — BP 120/70 | HR 53 | Ht 61.0 in | Wt 105.4 lb

## 2016-05-27 DIAGNOSIS — Z7901 Long term (current) use of anticoagulants: Secondary | ICD-10-CM | POA: Diagnosis not present

## 2016-05-27 DIAGNOSIS — I480 Paroxysmal atrial fibrillation: Secondary | ICD-10-CM

## 2016-05-27 DIAGNOSIS — I639 Cerebral infarction, unspecified: Secondary | ICD-10-CM | POA: Diagnosis not present

## 2016-05-27 DIAGNOSIS — I63511 Cerebral infarction due to unspecified occlusion or stenosis of right middle cerebral artery: Secondary | ICD-10-CM

## 2016-05-27 NOTE — Patient Instructions (Addendum)
We will be checking the following labs today - NONE   Medication Instructions:    Continue with your current medicines. BUT   I am cutting the metoprolol back to just 25 mg to take twice a day - hold for HR less than 55  Hold the Diltiazem for HR < 55 as well.   I am removing the BP parameters for these 2 drugs    Testing/Procedures To Be Arranged:  N/A  Follow-Up:   See me in 3 months    Other Special Instructions:   N/A    If you need a refill on your cardiac medications before your next appointment, please call your pharmacy.   Call the [place] office at [phone] if you have any questions, problems or concerns.

## 2016-05-27 NOTE — Telephone Encounter (Signed)
[facility] @ [phone] pt's ov note from today's visit.

## 2016-06-09 ENCOUNTER — Encounter: Payer: Self-pay | Admitting: Neurology

## 2016-06-09 ENCOUNTER — Ambulatory Visit (INDEPENDENT_AMBULATORY_CARE_PROVIDER_SITE_OTHER): Payer: Medicare Other | Admitting: Neurology

## 2016-06-09 VITALS — BP 148/75 | HR 63

## 2016-06-09 DIAGNOSIS — I480 Paroxysmal atrial fibrillation: Secondary | ICD-10-CM | POA: Diagnosis not present

## 2016-06-09 DIAGNOSIS — I6340 Cerebral infarction due to embolism of unspecified cerebral artery: Secondary | ICD-10-CM | POA: Diagnosis not present

## 2016-06-09 DIAGNOSIS — I679 Cerebrovascular disease, unspecified: Secondary | ICD-10-CM | POA: Diagnosis not present

## 2016-06-09 DIAGNOSIS — I63511 Cerebral infarction due to unspecified occlusion or stenosis of right middle cerebral artery: Secondary | ICD-10-CM

## 2016-06-09 DIAGNOSIS — Z8673 Personal history of transient ischemic attack (TIA), and cerebral infarction without residual deficits: Secondary | ICD-10-CM | POA: Diagnosis not present

## 2016-06-09 DIAGNOSIS — M353 Polymyalgia rheumatica: Secondary | ICD-10-CM

## 2016-06-09 DIAGNOSIS — E785 Hyperlipidemia, unspecified: Secondary | ICD-10-CM

## 2016-06-09 DIAGNOSIS — Z7901 Long term (current) use of anticoagulants: Secondary | ICD-10-CM

## 2016-06-09 DIAGNOSIS — I10 Essential (primary) hypertension: Secondary | ICD-10-CM | POA: Diagnosis not present

## 2016-06-09 NOTE — Patient Instructions (Addendum)
-   continue eliquis and ASA 81 for stroke prevention.  - follow up with urology to treat UTI - continue to recommend aggressive PT/OT at SNF as pt has good potential for recovery. Recommend PT to work on left side neglect also - pt symptoms concerning for BPPV and we recommend to consider BPPV maneuver by PT in SNF to help treat for BPPV - Follow up with your primary care physician for stroke risk factor modification. Recommend maintain blood pressure goal 130/80, diabetes with hemoglobin A1c goal below 7.0% and lipids with LDL cholesterol goal below 70 mg/dL.  - check BP at facility and record, keep hydrated and avoid low BP - follow up with cardiology - follow up in 4 months with me.

## 2016-06-09 NOTE — Progress Notes (Signed)
STROKE NEUROLOGY FOLLOW UP NOTE  NAME: KERAH HARDEBECK DOB: Apr 04, 1940  REASON FOR VISIT: stroke follow up HISTORY FROM: chart and brother  Today we had the pleasure of seeing CHARNAY NAZARIO in follow-up at our [facility]. Pt was accompanied by brother.   History Summary Ms. LAURIE PENADO is a 76 y.o. female with history of hypertension, hyperlipidemia, and previous stroke ( by MRI ) admitted on 09/21/15 for dizziness and unstable gait. found to have UTI and dehydration, received Abx. MRI showed tiny acute right ACA infarct. CTA head and neck showed severe intracranial stenosis involving b/l terminus ICA, b/l MCA M1 and bifurcation and attenuated right MCA branches. DVT, TTE negative. LDL 63 and A1C 6.0. She was put on DAPT for 3 months and recommend 30 day cardiac event monitoring on discharge.   11/02/15 follow up - the patient has been doing fair.  She went to ED on 09/28/15 for intermittent lightheadedness when walking and intermittent tremors for 2 days. Found again to have dehydration and UTI. Given IV bolus and Abx. ED note stated that " Patient had a recent diagnosis of atrial fibrillation" and "Patient also seemed to be in atrial fibrillation on cardiac monitor intermittently", for which I am not aware of. Her tele strip did show MAT, but no afib. She has cardiology appointment on 11/06/15. BP today 104/70. Brother stated that she did not drink water very well in NH. She walks with PT/OT but still more in the wheelchair not able to walk yet.   02/05/16 follow up - pt continued to have frequent UTI and was admitted on 11/26/15 for UTI, pyelonephritis hyponatremia and confusion. She was treated with antibiotics.  CT head no acute abnormalities. continued to have atrial tachycardia and has been seen by cardiology. Metoprolol increased and restarted on cardizem. Patient was asymptomatic.   On 12/20/15, she was seen by Dr. Brett Fairy for physical declined after discharge, progressively  being cognitively less alert and oriented and physically overall more impaired. Complained of left side increasing weak and slowing reaction, had to use wheelchair. Found to have low BP at 86/52. Had again CT head showed no acute change. And her ASA and plavix were changed to aggrenox.  Since the visit, pt is much improved. Today came in fully orientated and at her mental baseline. Brother stated that her mental changes in 11/2015 was considered due to frequent UTI for which she has been referred to a urologist for further evaluation. BP today is much improved too 123/58.  Interval History During the interval time, pt was admitted on 03/23/16 for left sided weakness, left neglect and AMS. MRI showed Acute punctate infarcts R occipital lobe periventricular white matter, RIGHT basal ganglia, patchy RIGHT frontoparietal lobes infarct, and punctate LEFT parietal lobe infarct, embolic pattern. MRA continued to show anterior circulation athero, severe L ICA siphon and R M1 stenosis. CUS, TTE, DVT unremarkable. LDL 78 and A1C 6.3. Cardiology reviewed EKG in 11/2015, confirmed afib, her aggrenox was changed to eliquis plus ASA 81mg . Continued on cardizem and metoprolol for rate control. She also found to have UTI and dehydration with AKI, treated with abx and IVF. She was later sent to SNF.  Currently, pt still in SNF, as per brother, she is not motivative with PT/OT, lack of effort for rehab. She occasionally felt dizziness on sitting up or standing up, complains of "I am falling, I am falling". I am concerning for BPPV in this setting. She still has left side body  neglect, although left side muscle strength is not significant weak. BP today 148/75.   REVIEW OF SYSTEMS: Full 14 system review of systems performed and notable only for those listed below and in HPI above, all others are negative:  Constitutional:   Cardiovascular:  Ear/Nose/Throat:   Skin:  Eyes:   Respiratory:   Gastroitestinal:     Genitourinary:  Hematology/Lymphatic:   Endocrine:  Musculoskeletal:  walking difficulty Allergy/Immunology:   Neurological:  Weakness Psychiatric:  Sleep:   The following represents the patient's updated allergies and side effects list: No Known Allergies  The neurologically relevant items on the patient's problem list were reviewed on today's visit.  Neurologic Examination  A problem focused neurological exam (12 or more points of the single system neurologic examination, vital signs counts as 1 point, cranial nerves count for 8 points) was performed.  Blood pressure (!) 148/75, pulse 63.  General - Well nourished, well developed, in no apparent distress.  Ophthalmologic - fundi not visualized due to noncooperation.   Cardiovascular - Regular rate and rhythm,  No afib rhythm.   Mental Status -  Level of arousal and orientation to place, and person were intact, not orientated to time. Language including expression, naming, repetition, comprehension was assessed and found intact.however, left side body neglect, but able to realized after several prompts.   Cranial Nerves II - XII - II - left lower quadrantanopia. III, IV, VI - Extraocular movements intact V - Facial sensation intact bilaterally. VII - Facial movement intact bilaterally. VIII - Hearing & vestibular intact bilaterally. X - Palate elevates symmetrically. XI - Chin turning & shoulder shrug intact bilaterally. XII - Tongue protrusion intact.  Motor Strength - The patient's strength was 5/5 RUE and RLE, 4/5 in LUE and LLE and pronator drift was present.  Bulk was normal and fasciculations were absent.   Motor Tone - Muscle tone was assessed at the neck and appendages and was normal.  Reflexes - The patient's reflexes were 1+ in all extremities and she had no pathological reflexes.  Sensory - Light touch, temperature/pinprick were assessed and were normal.    Coordination - The patient had normal movements  in the right hand with no ataxia or dysmetria.  Tremor was absent.  Gait and Station - in wheelchair, gait not tested.   Data reviewed: I personally reviewed the images and agree with the radiology interpretations.  Dg Chest 2 View 09/21/2015   COPD.  No evidence of pneumothorax or other acute findings. Aortic atherosclerosis noted.   Ct Head Wo Contrast 09/21/2015   No acute intracranial abnormalities. Generalized atrophy. Mega cisterna magna.   Mr Brain Wo Contrast 09/21/2015   Patient was not able to complete the examination. Sequences obtained are motion degraded. Question tiny acute infarct medial aspect posterior right frontal -parietal lobe junction. Remote infarct superior vermis. No intracranial hemorrhage. Mild chronic microvascular changes. Moderate global atrophy without hydrocephalus.   Mr Jodene Nam Head/brain Wo Cm 09/22/2015   Severely limited study due to motion artifact. No large or proximal arterial branch occlusion. No definite high-grade or correctable stenosis, although evaluation is fairly limited.   Ct Angio Head & Neck W Or Wo Contrast 09/24/2015   1. Negative for emergent large vessel occlusion but positive for SEVERE intracranial arterial stenoses: - Left ICA terminus (CRITICAL stenosis - see series 604 image 88), - Right MCA distal M1 and bifurcation (SEVERE stenoses) with attenuated Right MCA branches. 2. Atherosclerosis with mild to moderate stenosis of the right ICA siphon  and left MCA M1 segment. 3. No extracranial arterial stenosis. Tortuous cervical carotid and vertebral arteries. 4. Stable CT appearance of the brain. 5. Small layering left pleural effusion. Apical pulmonary scarring greater on the right.   2D Echocardiogram  - Left ventricle: The cavity size was normal. Wall thickness was increased in a pattern of mild LVH. Systolic function was normal. The estimated ejection fraction was in the range of 50% to 55%. Features are consistent with a pseudonormal  left ventricular filling pattern, with concomitant abnormal relaxation and increased filling pressure (grade 2 diastolic dysfunction). - Aortic valve: Mildly calcified annulus. Mildly thickened, mildly calcified leaflets. There was trivial regurgitation. - Mitral valve: Mildly calcified annulus. Mildly thickened leaflets  LE venous doppler - Negative for deep and superficial vein thrombosis in both leg  30 day cardiac event monitoring Zero atrial fibrillation Average HR 72 No pauses of 3 seconds or longer Multiple APCs Atrial bigeminy  Short atrial runs  Ct Head Wo Contrast 12/24/2015 1.    Mild atrophy and mild chronic microvascular ischemic changes related to aging and unchanged when compared to the night 07/12/2015 CT scan. 2.    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    Also seen on prior CT scan and MRI from 09/21/2015. 3.    There are no acute findings.  Ct Head Code Stroke W/o Cm 03/23/2016 1. No acute infarction suspected by CT. Old infarction in the right posterior parietal cortical and subcortical brain. 2. ASPECTS is 9, though the abnormality in M 6 is likely chronic. These results were paged by telephone at the time of interpretation on 03/23/2016 at 6:04 pm to Dr. Cheral Marker, awaiting response.   Mr Brain Wo Contrast 03/23/2016 Moderate to severely motion degraded examination. Acute versus subacute multi territorial infarcts including RIGHT MCA territory, most consistent with embolic phenomena.   Mr Jodene Nam Head Wo Contrast 03/24/2016 Negative for emergent large vessel occlusion. Widespread anterior circulation atherosclerosis, with relative sparing of the posterior circulation. No significant change suspected in severe anterior circulation stenoses since The CTA on 09/24/2015 (please see also that report): Severe stenoses of the left ICA supraclinoid segment and right MCA distal M1  segment.   2D Echocardiogram  - Left ventricle: The cavity size was normal. Wall thickness wasincreased in a pattern of moderate to severe LVH. There was mildfocal basal and mild asymmetric hypertrophy of the septum.Systolic function was normal. The estimated ejection fraction wasin the range of 55% to 60%. There was dynamic obstruction atrest, with a peak velocity of 120 cm/sec and a peak gradient of 81mm Hg. Wall motion was normal; there were no regional wall motionabnormalities. - Aortic valve: Moderately calcified annulus. Trileaflet; normalthickness, mildly calcified leaflets. Valve area (VTI): 2.6 cm^2.Valve area (Vmax): 2.47 cm^2. Valve area (Vmean): 2.47 cm^2.  - Mitral valve: Moderately calcified annulus. There was systolicanterior motion of the chordal structures.  Carotid Doppler   No significant (1-39%) ICA stenosis. Antegrade vertebral flow.  LE Venous Dopplers No evidence of DVT or baker's cyst.  Component     Latest Ref Rng & Units 08/23/2015 09/24/2015 09/25/2015  Triglycerides     40 - 160 mg/dL 222 (A)    Cholesterol     0 - 200 mg/dL 180    HDL Cholesterol     35 - 70 mg/dL 73 (A)    LDL (calc)     mg/dL 63    Hemoglobin A1C  6.0    Sed Rate     0 - 22 mm/hr  45 (H)   CRP     <1.0 mg/dL  1.1 (H)   TSH     0.350 - 4.500 uIU/mL   2.008   Component     Latest Ref Rng & Units 03/24/2016  Cholesterol     0 - 200 mg/dL 175  Triglycerides     <150 mg/dL 154 (H)  HDL Cholesterol     >40 mg/dL 66  Total CHOL/HDL Ratio     RATIO 2.7  VLDL     0 - 40 mg/dL 31  LDL (calc)     0 - 99 mg/dL 78  Hemoglobin A1C     4.8 - 5.6 % 6.3 (H)  Mean Plasma Glucose     mg/dL 134  TSH     0.350 - 4.500 uIU/mL 1.573  Ammonia     9 - 35 umol/L 42 (H)  RPR     Non Reactive Non Reactive  Vitamin B12     180 - 914 pg/mL 651    Assessment: As you may recall, she is a 76 y.o. Caucasian female with PMH of hypertension, hyperlipidemia admitted on 09/21/15 for  dizziness and unstable gait. found to have UTI and dehydration, received Abx. MRI showed tiny acute right ACA infarct. CTA head and neck showed severe intracranial stenosis involving b/l terminus ICA, b/l MCA M1 and bifurcation and attenuated right MCA branches. DVT, TTE negative. LDL 63 and A1C 6.0. Stroke considered due to dehydration and infection in the setting of intracranial stenosis. She was put on DAPT for 3 months and recommend 30 day cardiac event monitoring on discharge. She had several ED visit and admissions from 09/2015 to 11/2015 for dehydration and UTI, and was treated with IV bolus and Abx. Repeat several CT head showed no acute changes. ED note stated that " Patient had a recent diagnosis of atrial fibrillation" and "Patient also seemed to be in atrial fibrillation on cardiac monitor intermittently". Had cardiology follow up and 30 day monitoring did not show afib but atrial tachycardia. She is on metoprolol and cardizem. Her condition getting better and walks with walker.  Re-admitted on 41/93/79 with embolic stroke, R>L. EKG reviewed by cardiology and confirmed Afib in 11/2015, aggrenox changed from aggrenox to eliquis plus ASA 81 due to intracranial stenosis. She was also treated for UTI and AKI at that time. She is now in SNF, still has left side body neglect with left lower quadrantanopia, which limited her working with PT/OT. She occasional complains of dizziness and falling sensation on sitting up or standing up, concerning for BPPV.    Plan:  - continue eliquis and ASA 81 for stroke prevention.  - follow up with urology to treat UTI - continue to recommend aggressive PT/OT at SNF as pt has good potential for recovery. Recommend PT to work on left side neglect also - pt symptoms concerning for BPPV and we recommend to consider BPPV maneuver by PT in SNF to help treat for BPPV - Follow up with your primary care physician for stroke risk factor modification. Recommend maintain blood  pressure goal 130/80, diabetes with hemoglobin A1c goal below 7.0% and lipids with LDL cholesterol goal below 70 mg/dL.  - check BP at facility and record, keep hydrated and avoid low BP - follow up with cardiology - follow up in 4 months with me.   I spent more than 25 minutes of face to  face time with the patient. Greater than 50% of time was spent in counseling and coordination of care. We discussed aggressive rehab, BPPV maneuver training, bleeding precautions and follow up with cardiology.   No orders of the defined types were placed in this encounter.   No orders of the defined types were placed in this encounter.   Patient Instructions  - continue eliquis and ASA 81 for stroke prevention.  - follow up with urology to treat UTI - continue to recommend aggressive PT/OT at SNF as pt has good potential for recovery. Recommend PT to work on left side neglect also - pt symptoms concerning for BPPV and we recommend to consider BPPV maneuver by PT in SNF to help treat for BPPV - Follow up with your primary care physician for stroke risk factor modification. Recommend maintain blood pressure goal 130/80, diabetes with hemoglobin A1c goal below 7.0% and lipids with LDL cholesterol goal below 70 mg/dL.  - check BP at facility and record, keep hydrated and avoid low BP - follow up with cardiology - follow up in 4 months with me.    Rosalin Hawking, MD PhD [facility] [address], [place], [place] [address] [phone]

## 2016-06-10 DIAGNOSIS — Z7901 Long term (current) use of anticoagulants: Secondary | ICD-10-CM | POA: Insufficient documentation

## 2016-06-17 ENCOUNTER — Encounter: Payer: Self-pay | Admitting: Nurse Practitioner

## 2016-06-17 ENCOUNTER — Non-Acute Institutional Stay (SKILLED_NURSING_FACILITY): Payer: Medicare Other | Admitting: Nurse Practitioner

## 2016-06-17 DIAGNOSIS — N3000 Acute cystitis without hematuria: Secondary | ICD-10-CM

## 2016-06-17 DIAGNOSIS — R350 Frequency of micturition: Secondary | ICD-10-CM | POA: Diagnosis not present

## 2016-06-17 DIAGNOSIS — K219 Gastro-esophageal reflux disease without esophagitis: Secondary | ICD-10-CM | POA: Diagnosis not present

## 2016-06-17 DIAGNOSIS — Z7901 Long term (current) use of anticoagulants: Secondary | ICD-10-CM | POA: Diagnosis not present

## 2016-06-17 DIAGNOSIS — K5100 Ulcerative (chronic) pancolitis without complications: Secondary | ICD-10-CM | POA: Diagnosis not present

## 2016-06-17 DIAGNOSIS — I10 Essential (primary) hypertension: Secondary | ICD-10-CM | POA: Diagnosis not present

## 2016-06-17 DIAGNOSIS — I480 Paroxysmal atrial fibrillation: Secondary | ICD-10-CM | POA: Diagnosis not present

## 2016-06-17 DIAGNOSIS — E871 Hypo-osmolality and hyponatremia: Secondary | ICD-10-CM

## 2016-06-17 DIAGNOSIS — I63511 Cerebral infarction due to unspecified occlusion or stenosis of right middle cerebral artery: Secondary | ICD-10-CM | POA: Diagnosis not present

## 2016-06-17 DIAGNOSIS — F29 Unspecified psychosis not due to a substance or known physiological condition: Secondary | ICD-10-CM

## 2016-06-17 DIAGNOSIS — M353 Polymyalgia rheumatica: Secondary | ICD-10-CM | POA: Diagnosis not present

## 2016-06-17 DIAGNOSIS — L89309 Pressure ulcer of unspecified buttock, unspecified stage: Secondary | ICD-10-CM | POA: Insufficient documentation

## 2016-06-17 DIAGNOSIS — L89302 Pressure ulcer of unspecified buttock, stage 2: Secondary | ICD-10-CM

## 2016-06-17 DIAGNOSIS — K5900 Constipation, unspecified: Secondary | ICD-10-CM

## 2016-06-17 DIAGNOSIS — D649 Anemia, unspecified: Secondary | ICD-10-CM

## 2016-06-17 NOTE — Assessment & Plan Note (Signed)
Stable, off meds.  

## 2016-06-17 NOTE — Assessment & Plan Note (Signed)
Stable

## 2016-06-17 NOTE — Assessment & Plan Note (Signed)
Managed with Prednisone 10mg  daily

## 2016-06-17 NOTE — Assessment & Plan Note (Signed)
Persists, [name] 25mg  daily. Observe the patient.

## 2016-06-17 NOTE — Assessment & Plan Note (Signed)
Stable, continue Ranitidine 150mg qd.  

## 2016-06-17 NOTE — Assessment & Plan Note (Signed)
Continue TMP for UTI suppression.

## 2016-06-17 NOTE — Progress Notes (Signed)
Location: [facility] Room Number: 27 Place of Service:  SNF (31) Provider:  Tirza Senteno, [place]  NP  Jeanmarie Hubert, MD  Patient Care Team: [name], MD as PCP - General (Internal Medicine) [name], MD as Consulting Physician (Cardiology) [name], MD as Consulting Physician (Ophthalmology) [name], MD as Consulting Physician (Gastroenterology) [name], MD as Consulting Physician (Dermatology)  Extended Emergency Contact Information Primary Emergency Contact: [name]) Address: [place], [place] [address] [name] of [place] Phone: [phone] Mobile Phone: [phone] Relation: Brother Secondary Emergency Contact: [name] States of [place] Phone: [phone] Relation: Sister  Code Status:  DNR Goals of care: Advanced Directive information Advanced Directives 06/17/2016  Does Patient Have a Medical Advance Directive? Yes  Type of Paramedic of [place];Living will;Out of facility DNR (pink MOST or yellow form)  Does patient want to make changes to medical advance directive? No - Patient declined  Copy of [place] in Chart? Yes  Pre-existing out of facility DNR order (yellow form or pink MOST form) -     Chief Complaint  Patient presents with  . Medical Management of Chronic Issues    HPI:  Pt is a 76 y.o. female seen today for medical management of chronic diseases.     Hx of Afib, takign Meotprolol 25mg  bid,  Cardizem 90mg  bid, Eliquis 5mg  bifd, urinary frequency, taking Myrbetriq 25mg  qd, Ulcerative colitis, stable, off Mesalamine. Polymyalgia rheumatica is stable, taking Prednisone 10mg  daily. Taking MTP for UTI suppression therapy. Taking Senokot tid for constipation. Taking Ranitidine 150mg  qd for GERD Past Medical History:  Diagnosis Date  . Colitis   . Ecchymosis 11/15/2015  . Fracture of right  radius 02/2016  . GERD (gastroesophageal reflux disease)   . Heart murmur   . Hyperlipidemia   . Hypertension   . Osteopenia    s/p 7 yrs Fosamax  . Polio   . Polymyalgia ([place])   . Rheumatic fever   . Sleep disorder   . Stroke ([place])   . Ulcerative colitis    Past Surgical History:  Procedure Laterality Date  . BREAST LUMPECTOMY  1986   left; benign  . CATARACT EXTRACTION     Left eye   . COLONOSCOPY  2006  . Colonosopy  07/05/13   Dr. Deatra Ina  . DILATION AND CURETTAGE OF UTERUS  12/1999  . MELANOMA EXCISION  2007   left arm  . NECK SURGERY  1986   Benign growth on neck removed  . TONSILLECTOMY      No Known Allergies  Allergies as of 06/17/2016   No Known Allergies     Medication List       Accurate as of 06/17/16  2:37 PM. Always use your most recent med list.          acetaminophen 325 MG tablet Commonly known as:  TYLENOL Take 650 mg by mouth every 6 (six) hours as needed.   apixaban 5 MG Tabs tablet Commonly known as:  ELIQUIS Take 1 tablet (5 mg total) by mouth 2 (two) times daily.   aspirin 81 MG EC tablet Take 1 tablet (81 mg total) by mouth daily.   diltiazem 90 MG tablet Commonly known as:  CARDIZEM Take 90 mg by mouth. Take one tablet every 12 hours.   lactose free nutrition Liqd Take 237 mLs by mouth every  evening. Drink one can daily   metoprolol 50 MG tablet Commonly known as:  LOPRESSOR Take 0.5 tablets (25 mg total) by mouth 2 (two) times daily.   MYRBETRIQ 25 MG Tb24 tablet Generic drug:  mirabegron ER Take 25 mg by mouth daily.   predniSONE 5 MG tablet Commonly known as:  DELTASONE Take 2 tablets (10 mg total) by mouth daily with breakfast. TWO TABLETs DAILY TO TREAT POLYMYALGIA RHEUMATICA   ranitidine 150 MG tablet Commonly known as:  ZANTAC Take 150 mg by mouth at bedtime. Take one tablet at bedtime   senna 8.6 MG tablet Commonly known as:  SENOKOT Take 1 tablet by mouth 3 (three) times daily.   trimethoprim 100 MG  tablet Commonly known as:  TRIMPEX Take 100 mg by mouth daily.   UTI-STAT Liqd Take 30 mLs by mouth 2 (two) times daily.   zinc oxide 20 % ointment Apply 1 application topically as needed for irritation.       Review of Systems  Constitutional: Negative for activity change, appetite change, chills, diaphoresis, fatigue, fever and unexpected weight change.  HENT: Negative for congestion, ear discharge, ear pain, hearing loss, postnasal drip, rhinorrhea, sore throat, tinnitus, trouble swallowing and voice change.   Eyes: Positive for visual disturbance (corrective lenses). Negative for pain, redness and itching.       Old CVA  Respiratory: Negative for cough, choking, shortness of breath and wheezing.   Cardiovascular: Negative for chest pain, palpitations and leg swelling.  Gastrointestinal: Positive for diarrhea, nausea and vomiting. Negative for abdominal distention, abdominal pain and constipation.       Hx ulcerative colitis; on Lialda. Hx GERD, currently asymptomatic.  Endocrine: Negative for cold intolerance, heat intolerance, polydipsia, polyphagia and polyuria.       History of elevated glucose.  Genitourinary: Positive for frequency. Negative for difficulty urinating, dysuria, flank pain, hematuria, pelvic pain, urgency and vaginal discharge.       Nocturia. Recurrent UTI.  Musculoskeletal: Positive for back pain and gait problem. Negative for arthralgias, myalgias, neck pain and neck stiffness.       Hx PMR and chronic use of prednisone. History of osteopenia and kyphosis. 03/10/16- fracture of the distal right radius. In forearm cast. Complains of weakness and discomfort in the low back and increased problems getting out of her recliner chair.  Skin: Negative for color change, pallor and rash.       The right 2nd finger skin cut.   Allergic/Immunologic: Negative.   Neurological: Positive for weakness (left hemiparesis affecting the left leg mainly) and light-headedness.  Negative for dizziness, tremors, seizures, syncope, numbness and headaches.       History of poliomyelitis at age 12. History of CVA.09/21/15, 03/23/16. Encephalopathy during last hospital stay .  Hematological: Negative for adenopathy. Does not bruise/bleed easily.  Psychiatric/Behavioral: Positive for confusion, decreased concentration and dysphoric mood. Negative for agitation, behavioral problems, hallucinations, sleep disturbance and suicidal ideas. The patient is nervous/anxious. The patient is not hyperactive.     Immunization History  Administered Date(s) Administered  . Hep A / Hep B 06/08/2013, 06/15/2013, 06/29/2013, 06/12/2014  . Influenza Split 01/01/2014  . Influenza Whole 01/17/2009, 12/02/2010  . Influenza-Unspecified 12/22/2013, 12/21/2014, 01/04/2016  . Pneumococcal Conjugate-13 01/27/2015  . Pneumococcal Polysaccharide-23 07/25/2009  . Td 07/25/2009  . Tdap 12/22/2011   Pertinent  Health Maintenance Due  Topic Date Due  . MAMMOGRAM  07/18/2015  . INFLUENZA VACCINE  Completed  . DEXA SCAN  Completed  . PNA vac  Low Risk Adult  Completed   Fall Risk  06/09/2016 06/09/2016 02/05/2016 11/02/2015 05/01/2015  Falls in the past year? No No Yes No No  Number falls in past yr: - - 1 - -  Injury with Fall? - - No - -   Functional Status Survey:    Vitals:   06/17/16 1154  BP: (!) 166/80  Pulse: 76  Resp: 16  Temp: 97.6 F (36.4 C)  SpO2: 97%  Weight: 112 lb (50.8 kg)  Height: 5\' 1"  (1.549 m)   Body mass index is 21.16 kg/m. Physical Exam  Constitutional: She appears well-developed and well-nourished. No distress.  HENT:  Right Ear: External ear normal.  Left Ear: External ear normal.  Nose: Nose normal.  Mouth/Throat: Oropharynx is clear and moist. No oropharyngeal exudate.  Wearing hearing aids bilaterally. Previous exam showed EAC and TM normal bilaterally.  Eyes: Conjunctivae and EOM are normal. Pupils are equal, round, and reactive to light. No scleral  icterus.  Prescription lenses  Neck: No JVD present. No tracheal deviation present. No thyromegaly present.  Cardiovascular: Normal rate, regular rhythm and intact distal pulses.  Exam reveals no gallop and no friction rub.   Murmur heard. Pulmonary/Chest: Effort normal. No respiratory distress. She has no wheezes. She has no rales. She exhibits no tenderness.  Abdominal: Soft. Bowel sounds are normal. She exhibits no distension and no mass. There is no tenderness.  Large diastasis recti  Musculoskeletal: Normal range of motion. She exhibits edema and tenderness.  Kyphosis. Miild gait instability. Right wrist fx, short arm cast, swelling the right fingers. Right arm in a short cast  Lymphadenopathy:    She has no cervical adenopathy.  Neurological: She is alert. No cranial nerve deficit. She exhibits abnormal muscle tone. Coordination abnormal.  Mild tremor of extended arms. Left sided weakness with main effect on the left leg. Confused.  Skin: No rash noted. She is not diaphoretic. No erythema. No pallor.  R+L buttock superficial pressure ulcers.   Psychiatric: She has a normal mood and affect. Her behavior is normal. Judgment and thought content normal.    Labs reviewed:  Recent Labs  11/28/15 0849  11/30/15 1941 12/01/15 0224  03/26/16 0443 03/27/16 2230 04/23/16 1127  NA 131*  < > 127* 132*  < > 133* 130* 132*  K 3.3*  < > 3.3* 3.0*  < > 3.6 3.8 3.8  CL 93*  < > 95* 98*  < > 94* 89* 84*  CO2 27  < > 23 27  < > 27 28 29   GLUCOSE 171*  < > 229* 102*  < > 171* 154* 226*  BUN 14  < > 14 12  < > 18 19 28*  CREATININE 0.93  < > 0.97 0.95  < > 1.11* 1.09* 1.00  CALCIUM 8.4*  < > 8.1* 8.2*  < > 9.4 11.3* 9.4  MG 2.6*  --  1.8 1.8  --   --   --   --   < > = values in this interval not displayed.  Recent Labs  11/26/15 1013 11/27/15 0243 12/12/15 03/13/16 03/23/16 1750  AST 21 18 13 14 28   ALT 20 16 14 14 18   ALKPHOS 64 57 68 60 75  BILITOT 0.8 0.9  --   --  0.8  PROT 7.3  6.1*  --   --  7.9  ALBUMIN 4.0 3.4*  --   --  3.7    Recent Labs  11/26/15 1013  03/23/16 1750  03/25/16 0508 03/26/16 0443 03/27/16 2230 04/23/16 1127  WBC 13.3*  < > 10.4  < > 6.0 6.3 9.1 15.8*  NEUTROABS 11.3*  --  8.6*  --   --   --  7.1  --   HGB 13.4  < > 14.1  < > 11.7* 13.0 13.9  --   HCT 39.1  < > 42.0  < > 35.6* 38.3 40.1 34.4  MCV 88.1  < > 93.3  < > 92.5 91.4 90.3 94  PLT 209  < > 187  < > 165 182 206 218  < > = values in this interval not displayed. Lab Results  Component Value Date   TSH 1.573 03/24/2016   Lab Results  Component Value Date   HGBA1C 6.3 (H) 03/24/2016   Lab Results  Component Value Date   CHOL 175 03/24/2016   HDL 66 03/24/2016   LDLCALC 78 03/24/2016   TRIG 154 (H) 03/24/2016   CHOLHDL 2.7 03/24/2016    Significant Diagnostic Results in last 30 days:  No results found.  Assessment/Plan Essential hypertension Controlled. Continue Metoprolol 25mg  bid, Diltiazem 90mg  bid  Paroxysmal atrial fibrillation (HCC) Heart rate is in controlled, continue Metoprolol 25mg  bid and Diltiazem 90mg  bid  Acute ischemic right MCA stroke (HCC) Continue Elliquis and ASA  Ulcerative colitis ([place]) Stable, off meds.   GERD (gastroesophageal reflux disease) Stable, continue Ranitidine 150mg  qd  UTI (urinary tract infection) Continue TMP for UTI suppression.   Urinary frequency Persists, [name] 25mg  daily. Observe the patient.   Polymyalgia rheumatica (HCC) Managed with Prednisone 10mg  daily   Hyponatremia Stable, Na 130s,  last Na 132 04/23/16  Anemia Stable  Chronic anticoagulation Afib, continue Elliquis.   Psychosis Resolved, off meds.   Pressure ulcer of buttock R+L buttock, superficial, no s/s of infection, protective dressing, off pressure  Constipation Loose stools, decrease Senna to I qhs     Family/ staff Communication: SNF  Labs/tests ordered:  none

## 2016-06-17 NOTE — Assessment & Plan Note (Signed)
Controlled. Continue Metoprolol 25mg  bid, Diltiazem 90mg  bid

## 2016-06-17 NOTE — Assessment & Plan Note (Signed)
Continue Elliquis and ASA

## 2016-06-17 NOTE — Assessment & Plan Note (Signed)
Loose stools, decrease Senna to I qhs

## 2016-06-17 NOTE — Assessment & Plan Note (Signed)
Resolved, off meds.

## 2016-06-17 NOTE — Assessment & Plan Note (Signed)
Stable, Na 130s,  last Na 132 04/23/16

## 2016-06-17 NOTE — Assessment & Plan Note (Signed)
R+L buttock, superficial, no s/s of infection, protective dressing, off pressure

## 2016-06-17 NOTE — Assessment & Plan Note (Signed)
Afib, continue Elliquis.

## 2016-06-17 NOTE — Assessment & Plan Note (Signed)
Heart rate is in controlled, continue Metoprolol 25mg  bid and Diltiazem 90mg  bid

## 2016-07-10 DIAGNOSIS — F411 Generalized anxiety disorder: Secondary | ICD-10-CM | POA: Diagnosis not present

## 2016-07-14 ENCOUNTER — Encounter: Payer: Self-pay | Admitting: Internal Medicine

## 2016-07-14 ENCOUNTER — Non-Acute Institutional Stay (SKILLED_NURSING_FACILITY): Payer: Medicare Other | Admitting: Internal Medicine

## 2016-07-14 DIAGNOSIS — IMO0002 Reserved for concepts with insufficient information to code with codable children: Secondary | ICD-10-CM

## 2016-07-14 DIAGNOSIS — Z7901 Long term (current) use of anticoagulants: Secondary | ICD-10-CM | POA: Diagnosis not present

## 2016-07-14 DIAGNOSIS — I10 Essential (primary) hypertension: Secondary | ICD-10-CM

## 2016-07-14 DIAGNOSIS — I480 Paroxysmal atrial fibrillation: Secondary | ICD-10-CM

## 2016-07-14 DIAGNOSIS — M353 Polymyalgia rheumatica: Secondary | ICD-10-CM

## 2016-07-14 DIAGNOSIS — I6340 Cerebral infarction due to embolism of unspecified cerebral artery: Secondary | ICD-10-CM

## 2016-07-14 DIAGNOSIS — G8194 Hemiplegia, unspecified affecting left nondominant side: Secondary | ICD-10-CM | POA: Diagnosis not present

## 2016-07-14 DIAGNOSIS — R269 Unspecified abnormalities of gait and mobility: Secondary | ICD-10-CM | POA: Diagnosis not present

## 2016-07-14 DIAGNOSIS — E1165 Type 2 diabetes mellitus with hyperglycemia: Secondary | ICD-10-CM

## 2016-07-14 DIAGNOSIS — R42 Dizziness and giddiness: Secondary | ICD-10-CM | POA: Diagnosis not present

## 2016-07-14 DIAGNOSIS — E1143 Type 2 diabetes mellitus with diabetic autonomic (poly)neuropathy: Secondary | ICD-10-CM

## 2016-07-14 DIAGNOSIS — R634 Abnormal weight loss: Secondary | ICD-10-CM

## 2016-07-14 MED ORDER — PREDNISONE 5 MG PO TABS: ORAL_TABLET | ORAL | Status: DC

## 2016-07-14 MED ORDER — METFORMIN HCL 500 MG PO TABS: ORAL_TABLET | ORAL | 5 refills | Status: DC

## 2016-07-14 NOTE — Progress Notes (Signed)
Progress Note    Location:  [facility] Room Number: U72 Place of Service:  SNF [id number]) Provider: Jeanmarie Hubert, MD  Patient Care Team: [name], MD as PCP - General (Internal Medicine) [name], MD as Consulting Physician (Cardiology) [name], MD as Consulting Physician (Ophthalmology) [name], MD as Consulting Physician (Gastroenterology) [name], MD as Consulting Physician (Dermatology)  Extended Emergency Contact Information Primary Emergency Contact: [name]) Address: [place], [place] [address] [name] of [place] Phone: [phone] Mobile Phone: [phone] Relation: Brother Secondary Emergency Contact: [name] States of [place] Phone: [phone] Relation: Sister  Code Status:  DNR Goals of care: Advanced Directive information Advanced Directives 07/14/2016  Does Patient Have a Medical Advance Directive? Yes  Type of Paramedic of [place];Living will;Out of facility DNR (pink MOST or yellow form)  Does patient want to make changes to medical advance directive? -  Copy of [place] in Chart? Yes  Pre-existing out of facility DNR order (yellow form or pink MOST form) Yellow form placed in chart (order not valid for inpatient use);Pink MOST form placed in chart (order not valid for inpatient use)     Chief Complaint  Patient presents with  . Medical Management of Chronic Issues    routine visit    HPI:  Pt is a 76 y.o. female seen today for medical management of chronic diseases.    Cerebrovascular accident (CVA) due to embolism of cerebral artery ([place]) - residual left sided weakness and gait disturbance.  Abnormality of gait - poor balance. Generalized weakness ans multiple strokes.  Disequilibrium - very poor balance  Essential hypertension - better controlled. Not so many highs and  lows.  Left hemiparesis (HCC) - unchanged  Paroxysmal atrial fibrillation (HCC) - unchanged. Anticoaglutaed  Uncontrolled type 2 diabetes mellitus with diabetic autonomic neuropathy, without long-term current use of insulin (HCC) - A1c 6.3 when last checked.   Chronic anticoagulation - for PAF. Using apixaban.  Weight loss - steadily losing weight over the last year. No appetite. Total loss 14# in 14 months.     Past Medical History:  Diagnosis Date  . Colitis   . Ecchymosis 11/15/2015  . Fracture of right radius 02/2016  . GERD (gastroesophageal reflux disease)   . Heart murmur   . Hyperlipidemia   . Hypertension   . Osteopenia    s/p 7 yrs Fosamax  . Polio   . Polymyalgia ([place])   . Rheumatic fever   . Sleep disorder   . Stroke ([place])   . Ulcerative colitis    Past Surgical History:  Procedure Laterality Date  . BREAST LUMPECTOMY  1986   left; benign  . CATARACT EXTRACTION     Left eye   . COLONOSCOPY  2006  . Colonosopy  07/05/13   Dr. Deatra Ina  . DILATION AND CURETTAGE OF UTERUS  12/1999  . MELANOMA EXCISION  2007   left arm  . NECK SURGERY  1986   Benign growth on neck removed  . TONSILLECTOMY      No Known Allergies  Outpatient Encounter Prescriptions as of 07/14/2016  Medication Sig  . acetaminophen (TYLENOL) 325 MG tablet Take 650 mg by mouth every 6 (six) hours as needed.  Marland Kitchen apixaban (ELIQUIS) 5 MG TABS tablet Take 1 tablet (5 mg total) by mouth 2 (two) times daily.  Marland Kitchen  aspirin EC 81 MG EC tablet Take 1 tablet (81 mg total) by mouth daily.  . Cranberry-Vitamin C-Inulin (UTI-STAT) LIQD Take 30 mLs by mouth 2 (two) times daily.  Marland Kitchen diltiazem (CARDIZEM) 90 MG tablet Take 90 mg by mouth. Take one tablet every 12 hours.   . lactose free nutrition (BOOST) LIQD Take 237 mLs by mouth every evening. Drink one can daily   . metoprolol (LOPRESSOR) 50 MG tablet Take 0.5 tablets (25 mg total) by mouth 2 (two) times daily.  . mirabegron ER (MYRBETRIQ) 25 MG TB24 tablet  Take 25 mg by mouth daily.  . predniSONE (DELTASONE) 5 MG tablet Take 2 tablets (10 mg total) by mouth daily with breakfast. TWO TABLETs DAILY TO TREAT POLYMYALGIA RHEUMATICA  . ranitidine (ZANTAC) 150 MG tablet Take 150 mg by mouth at bedtime. Take one tablet at bedtime   . senna (SENOKOT) 8.6 MG tablet Take 1 tablet by mouth 3 (three) times daily.  Marland Kitchen trimethoprim (TRIMPEX) 100 MG tablet Take 100 mg by mouth daily.  Marland Kitchen zinc oxide 20 % ointment Apply 1 application topically as needed for irritation.   No facility-administered encounter medications on file as of 07/14/2016.     Review of Systems  Constitutional: Negative for activity change, appetite change, chills, diaphoresis, fatigue and fever.  HENT: Positive for hearing loss. Negative for congestion, ear discharge, ear pain, postnasal drip, rhinorrhea, sore throat, tinnitus, trouble swallowing and voice change.   Eyes: Positive for visual disturbance (corrective lenses). Negative for pain, redness and itching.       Old CVA  Respiratory: Negative for cough, choking, shortness of breath and wheezing.   Cardiovascular: Negative for chest pain, palpitations and leg swelling.  Gastrointestinal: Positive for diarrhea, nausea and vomiting. Negative for abdominal distention, abdominal pain and constipation.       Hx ulcerative colitis; on Lialda. Hx GERD, currently asymptomatic. Poor appetite.  Endocrine: Negative for cold intolerance, heat intolerance, polydipsia, polyphagia and polyuria.       History of elevated glucose.  Genitourinary: Positive for frequency. Negative for difficulty urinating, dysuria, flank pain, hematuria, pelvic pain, urgency and vaginal discharge.       Nocturia. Recurrent UTI.  Musculoskeletal: Positive for back pain and gait problem. Negative for arthralgias, myalgias, neck pain and neck stiffness.       Hx PMR and chronic use of prednisone. History of osteopenia and kyphosis. 03/10/16- fracture of the distal right  radius. In forearm cast. Complains of weakness and discomfort in the low back and increased problems getting out of her recliner chair.  Skin: Negative for color change, pallor and rash.  Allergic/Immunologic: Negative.   Neurological: Positive for weakness (left hemiparesis affecting the left leg mainly) and light-headedness. Negative for dizziness, tremors, seizures, syncope, numbness and headaches.       History of poliomyelitis at age 27. History of CVA.09/21/15, 03/23/16. Encephalopathy during last hospital stay .  Hematological: Negative.  Negative for adenopathy. Does not bruise/bleed easily.  Psychiatric/Behavioral: Positive for confusion, decreased concentration and dysphoric mood. Negative for agitation, behavioral problems, hallucinations, sleep disturbance and suicidal ideas. The patient is nervous/anxious. The patient is not hyperactive.     Immunization History  Administered Date(s) Administered  . Hep A / Hep B 06/08/2013, 06/15/2013, 06/29/2013, 06/12/2014  . Influenza Split 01/01/2014  . Influenza Whole 01/17/2009, 12/02/2010  . Influenza-Unspecified 12/22/2013, 12/21/2014, 01/04/2016  . Pneumococcal Conjugate-13 01/27/2015  . Pneumococcal Polysaccharide-23 07/25/2009  . Td 07/25/2009  . Tdap 12/22/2011   Pertinent  Health  Maintenance Due  Topic Date Due  . INFLUENZA VACCINE  10/22/2016  . MAMMOGRAM  07/14/2017  . DEXA SCAN  Completed  . PNA vac Low Risk Adult  Completed   Fall Risk  06/09/2016 06/09/2016 02/05/2016 11/02/2015 05/01/2015  Falls in the past year? No No Yes No No  Number falls in past yr: - - 1 - -  Injury with Fall? - - No - -     Vitals:   07/14/16 1042  BP: (!) 156/92  Pulse: 86  Resp: 20  Temp: (!) 96.9 F (36.1 C)  SpO2: 98%  Weight: 108 lb 9.6 oz (49.3 kg)   Body mass index is 20.52 kg/m. Physical Exam  Constitutional: She appears well-developed. No distress.  thin  HENT:  Right Ear: External ear normal.  Left Ear: External ear  normal.  Nose: Nose normal.  Mouth/Throat: Oropharynx is clear and moist. No oropharyngeal exudate.  Wearing hearing aids bilaterally. Previous exam showed EAC and TM normal bilaterally.  Eyes: Conjunctivae and EOM are normal. Pupils are equal, round, and reactive to light. No scleral icterus.  Prescription lenses  Neck: No JVD present. No tracheal deviation present. No thyromegaly present.  Cardiovascular: Normal rate, regular rhythm and intact distal pulses.  Exam reveals no gallop and no friction rub.   Murmur heard. Pulmonary/Chest: Effort normal. No respiratory distress. She has no wheezes. She has no rales. She exhibits no tenderness.  Abdominal: Soft. Bowel sounds are normal. She exhibits no distension and no mass. There is no tenderness.  Large diastasis recti  Musculoskeletal: Normal range of motion. She exhibits edema and tenderness.  Kyphosis. Gait instability. HX Right wrist fx,   Lymphadenopathy:    She has no cervical adenopathy.  Neurological: She is alert. No cranial nerve deficit. She exhibits abnormal muscle tone. Coordination abnormal.  Mild tremor of extended arms. Left sided weakness with main effect on the left leg. Confused.  Skin: No rash noted. She is not diaphoretic. No erythema. No pallor.  R+L buttock superficial pressure ulcers.   Psychiatric: She has a normal mood and affect. Her behavior is normal. Judgment and thought content normal.    Labs reviewed:  Recent Labs  11/28/15 0849  11/30/15 1941 12/01/15 0224  03/26/16 0443 03/27/16 2230 04/23/16 1127  NA 131*  < > 127* 132*  < > 133* 130* 132*  K 3.3*  < > 3.3* 3.0*  < > 3.6 3.8 3.8  CL 93*  < > 95* 98*  < > 94* 89* 84*  CO2 27  < > 23 27  < > 27 28 29   GLUCOSE 171*  < > 229* 102*  < > 171* 154* 226*  BUN 14  < > 14 12  < > 18 19 28*  CREATININE 0.93  < > 0.97 0.95  < > 1.11* 1.09* 1.00  CALCIUM 8.4*  < > 8.1* 8.2*  < > 9.4 11.3* 9.4  MG 2.6*  --  1.8 1.8  --   --   --   --   < > = values in  this interval not displayed.  Recent Labs  11/26/15 1013 11/27/15 0243 12/12/15 03/13/16 03/23/16 1750  AST 21 18 13 14 28   ALT 20 16 14 14 18   ALKPHOS 64 57 68 60 75  BILITOT 0.8 0.9  --   --  0.8  PROT 7.3 6.1*  --   --  7.9  ALBUMIN 4.0 3.4*  --   --  3.7  Recent Labs  11/26/15 1013  03/23/16 1750  03/25/16 0508 03/26/16 0443 03/27/16 2230 04/23/16 1127  WBC 13.3*  < > 10.4  < > 6.0 6.3 9.1 15.8*  NEUTROABS 11.3*  --  8.6*  --   --   --  7.1  --   HGB 13.4  < > 14.1  < > 11.7* 13.0 13.9  --   HCT 39.1  < > 42.0  < > 35.6* 38.3 40.1 34.4  MCV 88.1  < > 93.3  < > 92.5 91.4 90.3 94  PLT 209  < > 187  < > 165 182 206 218  < > = values in this interval not displayed. Lab Results  Component Value Date   TSH 1.573 03/24/2016   Lab Results  Component Value Date   HGBA1C 6.3 (H) 03/24/2016   Lab Results  Component Value Date   CHOL 175 03/24/2016   HDL 66 03/24/2016   LDLCALC 78 03/24/2016   TRIG 154 (H) 03/24/2016   CHOLHDL 2.7 03/24/2016   Assessment/Plan 1. Cerebrovascular accident (CVA) due to embolism of cerebral artery (HCC) Anticoagulated with apixaban  2. Abnormality of gait Using wheelchair  3. Disequilibrium Likely related to her multiple strokes.  4. Essential hypertension controlled  5. Left hemiparesis ([place]) unchanged  6. Paroxysmal atrial fibrillation (HCC) unchanged  7. Uncontrolled type 2 diabetes mellitus with diabetic autonomic neuropathy, without long-term current use of insulin (HCC) - A1c, CMP - metFORMIN (GLUCOPHAGE) 500 MG tablet; One at breakfast and one at supper to control diabetes  Dispense: 60 tablet; Refill: 5 -I reduced the prednisone to 5 mg qAm. Given for PMR dating back almosrt 3 years  8. Chronic anticoagulation Continue Eliquis  9. Weight loss Consider Remeron in the future

## 2016-07-15 LAB — BASIC METABOLIC PANEL: Glucose: 113 mg/dL

## 2016-07-17 DIAGNOSIS — R7303 Prediabetes: Secondary | ICD-10-CM | POA: Diagnosis not present

## 2016-07-17 DIAGNOSIS — E875 Hyperkalemia: Secondary | ICD-10-CM | POA: Diagnosis not present

## 2016-07-17 LAB — BASIC METABOLIC PANEL
BUN: 19 mg/dL (ref 4–21)
Creatinine: 0.9 mg/dL (ref <=1.1)
Potassium: 3.1 mmol/L — AB (ref 3.4–5.3)
Sodium: 130 mmol/L — AB (ref 137–147)

## 2016-07-17 LAB — HEPATIC FUNCTION PANEL
ALK PHOS: 120 U/L (ref 25–125)
ALT: 13 U/L (ref 7–35)
AST: 13 U/L (ref 13–35)
BILIRUBIN, TOTAL: 0.4 mg/dL

## 2016-07-17 LAB — HEMOGLOBIN A1C: HEMOGLOBIN A1C: 6

## 2016-07-18 ENCOUNTER — Encounter: Payer: Self-pay | Admitting: Nurse Practitioner

## 2016-07-18 ENCOUNTER — Other Ambulatory Visit: Payer: Self-pay | Admitting: *Deleted

## 2016-07-18 DIAGNOSIS — F0150 Vascular dementia without behavioral disturbance: Secondary | ICD-10-CM | POA: Insufficient documentation

## 2016-07-21 DIAGNOSIS — I639 Cerebral infarction, unspecified: Secondary | ICD-10-CM | POA: Diagnosis not present

## 2016-07-21 DIAGNOSIS — E875 Hyperkalemia: Secondary | ICD-10-CM | POA: Diagnosis not present

## 2016-07-21 LAB — BASIC METABOLIC PANEL
BUN: 20 mg/dL (ref 4–21)
Creatinine: 1 mg/dL (ref <=1.1)
GLUCOSE: 126 mg/dL
POTASSIUM: 3.3 mmol/L — AB (ref 3.4–5.3)
Sodium: 127 mmol/L — AB (ref 137–147)

## 2016-07-22 ENCOUNTER — Other Ambulatory Visit: Payer: Self-pay | Admitting: *Deleted

## 2016-07-24 ENCOUNTER — Encounter: Payer: Self-pay | Admitting: Internal Medicine

## 2016-07-24 ENCOUNTER — Non-Acute Institutional Stay (SKILLED_NURSING_FACILITY): Payer: Medicare Other | Admitting: Internal Medicine

## 2016-07-24 DIAGNOSIS — T07XXXA Unspecified multiple injuries, initial encounter: Secondary | ICD-10-CM

## 2016-07-24 DIAGNOSIS — R079 Chest pain, unspecified: Secondary | ICD-10-CM | POA: Diagnosis not present

## 2016-07-24 NOTE — Progress Notes (Signed)
Location:  [facility] Room Number: N34 Place of Service:  SNF [phone]) Provider:  Jeanmarie Hubert, MD  Patient Care Team: [name], MD as PCP - General (Internal Medicine) [name], MD as Consulting Physician (Cardiology) [name], MD as Consulting Physician (Ophthalmology) [name], MD as Consulting Physician (Gastroenterology) [name], MD as Consulting Physician (Dermatology)  Extended Emergency Contact Information Primary Emergency Contact: [name]) Address: [place], [place] [address] Tammy Beard of [place] Phone: [phone] Mobile Phone: [phone] Relation: Brother Secondary Emergency Contact: [name] [place] of [place] Phone: [phone] Relation: Sister  Code Status:  DNR Goals of care: Advanced Directive information Advanced Directives 07/24/2016  Does Patient Have a Medical Advance Directive? Yes  Type of Paramedic of [place];Living will;Out of facility DNR (pink MOST or yellow form)  Does patient want to make changes to medical advance directive? -  Copy of [place] in Chart? Yes  Pre-existing out of facility DNR order (yellow form or pink MOST form) Yellow form placed in chart (order not valid for inpatient use);Pink MOST form placed in chart (order not valid for inpatient use)     Chief Complaint  Patient presents with  . Acute Visit    nurse concern about bruises patient on aspirin and Eliquis    HPI:  Pt is a 76 y.o. female seen today for an acute visit for evaluation of bruises noted mainly on the legs and arms and hands. No discomfort at bruised sites and no evidence of trauma. She requires a lot of hands on assistance to help with personal care and to get her in and out of bed. No falls recently.   Past Medical History:  Diagnosis Date  . Colitis   . Ecchymosis 11/15/2015  . Fracture of  right radius 02/2016  . GERD (gastroesophageal reflux disease)   . Heart murmur   . Hyperlipidemia   . Hypertension   . Osteopenia    s/p 7 yrs Fosamax  . Polio   . Polymyalgia ()   . Rheumatic fever   . Sleep disorder   . Stroke ([place])   . Ulcerative colitis    Past Surgical History:  Procedure Laterality Date  . BREAST LUMPECTOMY  1986   left; benign  . CATARACT EXTRACTION     Left eye   . COLONOSCOPY  2006  . Colonosopy  07/05/13   Dr. Deatra Ina  . DILATION AND CURETTAGE OF UTERUS  12/1999  . MELANOMA EXCISION  2007   left arm  . NECK SURGERY  1986   Benign growth on neck removed  . TONSILLECTOMY      No Known Allergies  Outpatient Encounter Prescriptions as of 07/24/2016  Medication Sig  . acetaminophen (TYLENOL) 325 MG tablet Take 650 mg by mouth every 6 (six) hours as needed.  Marland Kitchen apixaban (ELIQUIS) 5 MG TABS tablet Take 1 tablet (5 mg total) by mouth 2 (two) times daily.  Marland Kitchen aspirin EC 81 MG EC tablet Take 1 tablet (81 mg total) by mouth daily.  . Cranberry-Vitamin C-Inulin (UTI-STAT) LIQD Take 30 mLs by mouth 2 (two) times daily.  Marland Kitchen diltiazem (CARDIZEM) 90 MG tablet Take 90 mg by mouth. Take one tablet every 12 hours.   . lactose free nutrition (BOOST) LIQD Take 237 mLs by mouth every evening. Drink one can daily   . metoprolol (LOPRESSOR) 50  MG tablet Take 0.5 tablets (25 mg total) by mouth 2 (two) times daily.  . mirabegron ER (MYRBETRIQ) 25 MG TB24 tablet Take 25 mg by mouth daily.  . potassium chloride SA (K-DUR,KLOR-CON) 20 MEQ tablet Take 20 mEq by mouth. Take one tablet daily  . predniSONE (DELTASONE) 5 MG tablet ONE TABLETs DAILY TO TREAT POLYMYALGIA RHEUMATICA  . ranitidine (ZANTAC) 150 MG tablet Take 150 mg by mouth at bedtime. Take one tablet at bedtime   . senna (SENOKOT) 8.6 MG tablet Take 1 tablet by mouth 3 (three) times daily.  Marland Kitchen trimethoprim (TRIMPEX) 100 MG tablet Take 100 mg by mouth daily.  Marland Kitchen zinc oxide 20 % ointment Apply 1 application topically  as needed for irritation.  . [DISCONTINUED] metFORMIN (GLUCOPHAGE) 500 MG tablet One at breakfast and one at supper to control diabetes   No facility-administered encounter medications on file as of 07/24/2016.     Review of Systems  Constitutional: Negative for activity change, appetite change, chills, diaphoresis, fatigue and fever.  HENT: Positive for hearing loss. Negative for congestion, ear discharge, ear pain, postnasal drip, rhinorrhea, sore throat, tinnitus, trouble swallowing and voice change.   Eyes: Positive for visual disturbance (corrective lenses). Negative for pain, redness and itching.       Old CVA  Respiratory: Negative for cough, choking, shortness of breath and wheezing.   Cardiovascular: Negative for chest pain, palpitations and leg swelling.  Gastrointestinal: Positive for diarrhea, nausea and vomiting. Negative for abdominal distention, abdominal pain and constipation.       Hx ulcerative colitis; on Lialda. Hx GERD, currently asymptomatic. Poor appetite.  Endocrine: Negative for cold intolerance, heat intolerance, polydipsia, polyphagia and polyuria.       History of elevated glucose.  Genitourinary: Positive for frequency. Negative for difficulty urinating, dysuria, flank pain, hematuria, pelvic pain, urgency and vaginal discharge.       Nocturia. Recurrent UTI.  Musculoskeletal: Positive for back pain and gait problem. Negative for arthralgias, myalgias, neck pain and neck stiffness.       Hx PMR and chronic use of prednisone. History of osteopenia and kyphosis. 03/10/16- fracture of the distal right radius. In forearm cast. Complains of weakness and discomfort in the low back and increased problems getting out of her recliner chair.  Skin: Negative for color change, pallor and rash.       Multiple scattered bruises  Allergic/Immunologic: Negative.   Neurological: Positive for weakness (left hemiparesis affecting the left leg mainly) and light-headedness.  Negative for dizziness, tremors, seizures, syncope, numbness and headaches.       History of poliomyelitis at age 52. History of CVA.09/21/15, 03/23/16. Encephalopathy during last hospital stay .  Hematological: Negative.  Negative for adenopathy. Does not bruise/bleed easily.  Psychiatric/Behavioral: Positive for confusion, decreased concentration and dysphoric mood. Negative for agitation, behavioral problems, hallucinations, sleep disturbance and suicidal ideas. The patient is nervous/anxious. The patient is not hyperactive.     Immunization History  Administered Date(s) Administered  . Hep A / Hep B 06/08/2013, 06/15/2013, 06/29/2013, 06/12/2014  . Influenza Split 01/01/2014  . Influenza Whole 01/17/2009, 12/02/2010  . Influenza-Unspecified 12/22/2013, 12/21/2014, 01/04/2016  . Pneumococcal Conjugate-13 01/27/2015  . Pneumococcal Polysaccharide-23 07/25/2009  . Td 07/25/2009  . Tdap 12/22/2011   Pertinent  Health Maintenance Due  Topic Date Due  . FOOT EXAM  11/18/1949  . URINE MICROALBUMIN  11/18/1949  . OPHTHALMOLOGY EXAM  10/23/2015  . INFLUENZA VACCINE  10/22/2016  . HEMOGLOBIN A1C  01/16/2017  . MAMMOGRAM  07/14/2017  . DEXA SCAN  Completed  . PNA vac Low Risk Adult  Completed   Fall Risk  06/09/2016 06/09/2016 02/05/2016 11/02/2015 05/01/2015  Falls in the past year? No No Yes No No  Number falls in past yr: - - 1 - -  Injury with Fall? - - No - -   Functional Status Survey:    Vitals:   07/24/16 1611  BP: (!) 154/88  Pulse: 86  Resp: 18  Temp: 98 F (36.7 C)  SpO2: 98%  Weight: 104 lb 8 oz (47.4 kg)  Height: 5\' 1"  (1.549 m)   Body mass index is 19.75 kg/m. Physical Exam  Constitutional: She appears well-developed. No distress.  thin  HENT:  Right Ear: External ear normal.  Left Ear: External ear normal.  Nose: Nose normal.  Mouth/Throat: Oropharynx is clear and moist. No oropharyngeal exudate.  Wearing hearing aids bilaterally. Previous exam showed EAC  and TM normal bilaterally.  Eyes: Conjunctivae and EOM are normal. Pupils are equal, round, and reactive to light. No scleral icterus.  Prescription lenses  Neck: No JVD present. No tracheal deviation present. No thyromegaly present.  Cardiovascular: Normal rate, regular rhythm and intact distal pulses.  Exam reveals no gallop and no friction rub.   Murmur heard. Pulmonary/Chest: Effort normal. No respiratory distress. She has no wheezes. She has no rales. She exhibits no tenderness.  Abdominal: Soft. Bowel sounds are normal. She exhibits no distension and no mass. There is no tenderness.  Large diastasis recti  Musculoskeletal: Normal range of motion. She exhibits edema and tenderness.  Kyphosis. Gait instability. HX Right wrist fx,   Lymphadenopathy:    She has no cervical adenopathy.  Neurological: She is alert. No cranial nerve deficit. She exhibits abnormal muscle tone. Coordination abnormal.  Mild tremor of extended arms. Left sided weakness with main effect on the left leg. Confused.  Skin: No rash noted. She is not diaphoretic. No erythema. No pallor.  R+L buttock superficial pressure ulcers.  Multiple scattered bruises  Psychiatric: She has a normal mood and affect. Her behavior is normal.    Labs reviewed:  Recent Labs  11/28/15 0849  11/30/15 1941 12/01/15 0224  03/26/16 0443 03/27/16 2230 04/23/16 1127 07/17/16 07/21/16  NA 131*  < > 127* 132*  < > 133* 130* 132* 130* 127*  K 3.3*  < > 3.3* 3.0*  < > 3.6 3.8 3.8 3.1* 3.3*  CL 93*  < > 95* 98*  < > 94* 89* 84*  --   --   CO2 27  < > 23 27  < > 27 28 29   --   --   GLUCOSE 171*  < > 229* 102*  < > 171* 154* 226*  --   --   BUN 14  < > 14 12  < > 18 19 28* 19 20  CREATININE 0.93  < > 0.97 0.95  < > 1.11* 1.09* 1.00 0.9 1.0  CALCIUM 8.4*  < > 8.1* 8.2*  < > 9.4 11.3* 9.4  --   --   MG 2.6*  --  1.8 1.8  --   --   --   --   --   --   < > = values in this interval not displayed.  Recent Labs  11/26/15 1013  11/27/15 0243  03/13/16 03/23/16 1750 07/17/16  AST 21 18  < > 14 28 13   ALT 20 16  < > 14 18 13   ALKPHOS  64 57  < > 60 75 120  BILITOT 0.8 0.9  --   --  0.8  --   PROT 7.3 6.1*  --   --  7.9  --   ALBUMIN 4.0 3.4*  --   --  3.7  --   < > = values in this interval not displayed.  Recent Labs  11/26/15 1013  03/23/16 1750  03/25/16 0508 03/26/16 0443 03/27/16 2230 04/23/16 1127  WBC 13.3*  < > 10.4  < > 6.0 6.3 9.1 15.8*  NEUTROABS 11.3*  --  8.6*  --   --   --  7.1  --   HGB 13.4  < > 14.1  < > 11.7* 13.0 13.9  --   HCT 39.1  < > 42.0  < > 35.6* 38.3 40.1 34.4  MCV 88.1  < > 93.3  < > 92.5 91.4 90.3 94  PLT 209  < > 187  < > 165 182 206 218  < > = values in this interval not displayed. Lab Results  Component Value Date   TSH 1.573 03/24/2016   Lab Results  Component Value Date   HGBA1C 6.0 07/17/2016   Lab Results  Component Value Date   CHOL 175 03/24/2016   HDL 66 03/24/2016   LDLCALC 78 03/24/2016   TRIG 154 (H) 03/24/2016   CHOLHDL 2.7 03/24/2016    Assessment/Plan 1. Multiple bruises Multiple bruises could be re;lated to the combination of ASA and apixaban. I see no reason to continue the ASA, so it will be stopped.

## 2016-07-25 ENCOUNTER — Encounter: Payer: Self-pay | Admitting: *Deleted

## 2016-07-25 ENCOUNTER — Other Ambulatory Visit: Payer: Self-pay | Admitting: Nurse Practitioner

## 2016-07-25 ENCOUNTER — Encounter: Payer: Self-pay | Admitting: Nurse Practitioner

## 2016-07-25 ENCOUNTER — Ambulatory Visit
Admission: RE | Admit: 2016-07-25 | Discharge: 2016-07-25 | Disposition: A | Discharge status: Home / Self Care | Payer: Medicare Other | Source: Ambulatory Visit | Attending: Nurse Practitioner | Admitting: Nurse Practitioner

## 2016-07-25 DIAGNOSIS — Z09 Encounter for follow-up examination after completed treatment for conditions other than malignant neoplasm: Secondary | ICD-10-CM

## 2016-07-25 DIAGNOSIS — R0781 Pleurodynia: Secondary | ICD-10-CM | POA: Diagnosis not present

## 2016-07-25 DIAGNOSIS — Z8781 Personal history of (healed) traumatic fracture: Secondary | ICD-10-CM | POA: Insufficient documentation

## 2016-07-29 ENCOUNTER — Encounter: Payer: Self-pay | Admitting: Nurse Practitioner

## 2016-07-29 ENCOUNTER — Non-Acute Institutional Stay (SKILLED_NURSING_FACILITY): Payer: Medicare Other | Admitting: Nurse Practitioner

## 2016-07-29 DIAGNOSIS — E1165 Type 2 diabetes mellitus with hyperglycemia: Secondary | ICD-10-CM

## 2016-07-29 DIAGNOSIS — E871 Hypo-osmolality and hyponatremia: Secondary | ICD-10-CM

## 2016-07-29 DIAGNOSIS — I480 Paroxysmal atrial fibrillation: Secondary | ICD-10-CM | POA: Diagnosis not present

## 2016-07-29 DIAGNOSIS — F29 Unspecified psychosis not due to a substance or known physiological condition: Secondary | ICD-10-CM

## 2016-07-29 DIAGNOSIS — R350 Frequency of micturition: Secondary | ICD-10-CM | POA: Diagnosis not present

## 2016-07-29 DIAGNOSIS — I10 Essential (primary) hypertension: Secondary | ICD-10-CM

## 2016-07-29 DIAGNOSIS — IMO0002 Reserved for concepts with insufficient information to code with codable children: Secondary | ICD-10-CM

## 2016-07-29 DIAGNOSIS — I6340 Cerebral infarction due to embolism of unspecified cerebral artery: Secondary | ICD-10-CM

## 2016-07-29 DIAGNOSIS — M353 Polymyalgia rheumatica: Secondary | ICD-10-CM | POA: Diagnosis not present

## 2016-07-29 DIAGNOSIS — L03032 Cellulitis of left toe: Secondary | ICD-10-CM | POA: Diagnosis not present

## 2016-07-29 DIAGNOSIS — K5900 Constipation, unspecified: Secondary | ICD-10-CM | POA: Diagnosis not present

## 2016-07-29 DIAGNOSIS — N3000 Acute cystitis without hematuria: Secondary | ICD-10-CM | POA: Diagnosis not present

## 2016-07-29 DIAGNOSIS — Z8781 Personal history of (healed) traumatic fracture: Secondary | ICD-10-CM

## 2016-07-29 DIAGNOSIS — F0150 Vascular dementia without behavioral disturbance: Secondary | ICD-10-CM | POA: Diagnosis not present

## 2016-07-29 DIAGNOSIS — E1143 Type 2 diabetes mellitus with diabetic autonomic (poly)neuropathy: Secondary | ICD-10-CM | POA: Diagnosis not present

## 2016-07-29 DIAGNOSIS — K219 Gastro-esophageal reflux disease without esophagitis: Secondary | ICD-10-CM

## 2016-07-29 DIAGNOSIS — D649 Anemia, unspecified: Secondary | ICD-10-CM

## 2016-07-29 NOTE — Assessment & Plan Note (Signed)
Update CBC. 

## 2016-07-29 NOTE — Assessment & Plan Note (Signed)
Stable, Na 130s,  last Na 132 04/23/16

## 2016-07-29 NOTE — Assessment & Plan Note (Signed)
Resolved, off meds.

## 2016-07-29 NOTE — Assessment & Plan Note (Signed)
Controlled. Continue Metoprolol 25mg  bid, Diltiazem 90mg  bid

## 2016-07-29 NOTE — Assessment & Plan Note (Signed)
Diet controlled, Hgb a1c 6.0 07/18/16

## 2016-07-29 NOTE — Assessment & Plan Note (Signed)
Stable, continue Senna to I tid

## 2016-07-29 NOTE — Assessment & Plan Note (Signed)
R+L great toes,  small amount of yellow drainage and mild erythema noted at ht edge of nail beds, recent trimmed toenails. Pain when examined.  Apply Bactroban ointment bid x 2 weeks, open toe shoes until healed. Observe.

## 2016-07-29 NOTE — Assessment & Plan Note (Signed)
Persists, continue [name] 25mg  daily. Observe the patient.

## 2016-07-29 NOTE — Assessment & Plan Note (Signed)
TMP for UTI suppression.

## 2016-07-29 NOTE — Assessment & Plan Note (Addendum)
Heart rate is in controlled, continue Metoprolol 25mg  bid and Diltiazem 90mg  bid, 07/18/16 Na 130, K 4.1, Bun 19, creat 0.86, Hgb a1c 6.0

## 2016-07-29 NOTE — Assessment & Plan Note (Signed)
Right sided weakness 

## 2016-07-29 NOTE — Assessment & Plan Note (Addendum)
07/24/16 X-ray [facility] healing fracture of the right ninth rib, the distal right rib seen 07/25/16 X-ray ribs at [facility]. Probably acute nondisplaced left tenth posterior rib fracture. No pain upon my examination today, observe, prn Tylenol available to her.

## 2016-07-29 NOTE — Assessment & Plan Note (Signed)
Managed with Prednisone 5mg  daily

## 2016-07-29 NOTE — Assessment & Plan Note (Signed)
07/25/16 MMSE  16/28 SNF for care assistance, may consider memory preserving meds.

## 2016-07-29 NOTE — Progress Notes (Signed)
Location:  [facility] Room Number: 74 Place of Service:  SNF (31) Provider:  Mast, Manxie  NP  [name], MD  Patient Care Team: [name], MD as PCP - General (Internal Medicine) [name], MD as Consulting Physician (Cardiology) [name], MD as Consulting Physician (Ophthalmology) [name], MD as Consulting Physician (Gastroenterology) [name], MD as Consulting Physician (Dermatology)  Extended Emergency Contact Information Primary Emergency Contact: [name]) Address: [address], [place] [address] [name] of [place] Phone: [phone] Mobile Phone: [phone] Relation: Brother Secondary Emergency Contact: [name] [place] of [place] Phone: [phone] Relation: Sister  Code Status:  DNR Goals of care: Advanced Directive information Advanced Directives 07/29/2016  Does Patient Have a Medical Advance Directive? Yes  Type of Paramedic of [place];Living will;Out of facility DNR (pink MOST or yellow form)  Does patient want to make changes to medical advance directive? No - Patient declined  Copy of [place] in Chart? Yes  Pre-existing out of facility DNR order (yellow form or pink MOST form) Yellow form placed in chart (order not valid for inpatient use)     Chief Complaint  Patient presents with  . Acute Visit    Both great toes has a purlent drainage seen.    HPI:  Pt is a 76 y.o. female seen today for an acute visit for R+L great toes,  small amount of yellow drainage and mild erythema noted at ht edge of nail beds, recent trimmed toenails. Pain when examined.     Hx of Afib, taking Meotprolol 25mg  bid,  Cardizem 90mg  bid, Eliquis 5mg  bifd, urinary frequency, taking Myrbetriq 25mg  qd, Ulcerative colitis, stable, off Mesalamine. Polymyalgia rheumatica is stable, taking Prednisone 5mg  daily.  Taking MTP for UTI suppression therapy. Taking Senokot tid for constipation. Taking Ranitidine 150mg  qd for GERD Past Medical History:  Diagnosis Date  . Colitis   . Ecchymosis 11/15/2015  . Fracture of right radius 02/2016  . GERD (gastroesophageal reflux disease)   . Heart murmur   . Hyperlipidemia   . Hypertension   . Osteopenia    s/p 7 yrs Fosamax  . Polio   . Polymyalgia ([place])   . Rheumatic fever   . Sleep disorder   . Stroke ([place])   . Ulcerative colitis    Past Surgical History:  Procedure Laterality Date  . BREAST LUMPECTOMY  1986   left; benign  . CATARACT EXTRACTION     Left eye   . COLONOSCOPY  2006  . Colonosopy  07/05/13   Dr. Deatra Ina  . DILATION AND CURETTAGE OF UTERUS  12/1999  . MELANOMA EXCISION  2007   left arm  . NECK SURGERY  1986   Benign growth on neck removed  . TONSILLECTOMY      No Known Allergies  Outpatient Encounter Prescriptions as of 07/29/2016  Medication Sig  . acetaminophen (TYLENOL) 325 MG tablet Take 650 mg by mouth every 6 (six) hours as needed.  Marland Kitchen apixaban (ELIQUIS) 5 MG TABS tablet Take 1 tablet (5 mg total) by mouth 2 (two) times daily.  . Cranberry-Vitamin C-Inulin (UTI-STAT) LIQD Take 30 mLs by mouth 2 (two) times daily.  Marland Kitchen diltiazem (CARDIZEM) 90 MG tablet Take 90 mg by mouth. Take one tablet every 12 hours.   . metoprolol (LOPRESSOR) 50 MG tablet Take 0.5 tablets (25 mg total) by  mouth 2 (two) times daily.  . mirabegron ER (MYRBETRIQ) 25 MG TB24 tablet Take 25 mg by mouth daily.  . potassium chloride SA (K-DUR,KLOR-CON) 20 MEQ tablet Take 20 mEq by mouth. Take one tablet daily  . predniSONE (DELTASONE) 5 MG tablet ONE TABLETs DAILY TO TREAT POLYMYALGIA RHEUMATICA  . ranitidine (ZANTAC) 150 MG tablet Take 150 mg by mouth at bedtime. Take one tablet at bedtime   . senna (SENOKOT) 8.6 MG tablet Take 1 tablet by mouth 3 (three) times daily.  Marland Kitchen trimethoprim (TRIMPEX) 100 MG tablet Take 100 mg by mouth daily.  Marland Kitchen zinc oxide 20 %  ointment Apply 1 application topically as needed for irritation.  . [DISCONTINUED] aspirin EC 81 MG EC tablet Take 1 tablet (81 mg total) by mouth daily.  . [DISCONTINUED] lactose free nutrition (BOOST) LIQD Take 237 mLs by mouth every evening. Drink one can daily    No facility-administered encounter medications on file as of 07/29/2016.     Review of Systems  Constitutional: Negative for activity change, appetite change, chills, diaphoresis, fatigue and fever.  HENT: Positive for hearing loss. Negative for congestion, ear discharge, ear pain, postnasal drip, rhinorrhea, sore throat, tinnitus, trouble swallowing and voice change.   Eyes: Positive for visual disturbance (corrective lenses). Negative for pain, redness and itching.       Old CVA  Respiratory: Negative for cough, choking, shortness of breath and wheezing.   Cardiovascular: Negative for chest pain, palpitations and leg swelling.  Gastrointestinal: Positive for diarrhea, nausea and vomiting. Negative for abdominal distention, abdominal pain and constipation.       Hx ulcerative colitis; on Lialda. Hx GERD, currently asymptomatic. Poor appetite.  Endocrine: Negative for cold intolerance, heat intolerance, polydipsia, polyphagia and polyuria.       History of elevated glucose.  Genitourinary: Positive for frequency. Negative for difficulty urinating, dysuria, flank pain, hematuria, pelvic pain, urgency and vaginal discharge.       Nocturia. Recurrent UTI.  Musculoskeletal: Positive for back pain and gait problem. Negative for arthralgias, myalgias, neck pain and neck stiffness.       Hx PMR and chronic use of prednisone. History of osteopenia and kyphosis. 03/10/16- fracture of the distal right radius. In forearm cast. Complains of weakness and discomfort in the low back and increased problems getting out of her recliner chair.  Skin: Negative for color change, pallor and rash.  Allergic/Immunologic: Negative.   Neurological:  Positive for weakness (left hemiparesis affecting the left leg mainly) and light-headedness. Negative for dizziness, tremors, seizures, syncope, numbness and headaches.       History of poliomyelitis at age 65. History of CVA.09/21/15, 03/23/16. Encephalopathy during last hospital stay .  Hematological: Negative.  Negative for adenopathy. Does not bruise/bleed easily.  Psychiatric/Behavioral: Positive for confusion, decreased concentration and dysphoric mood. Negative for agitation, behavioral problems, hallucinations, sleep disturbance and suicidal ideas. The patient is nervous/anxious. The patient is not hyperactive.     Immunization History  Administered Date(s) Administered  . Hep A / Hep B 06/08/2013, 06/15/2013, 06/29/2013, 06/12/2014  . Influenza Split 01/01/2014  . Influenza Whole 01/17/2009, 12/02/2010  . Influenza-Unspecified 12/22/2013, 12/21/2014, 01/04/2016  . Pneumococcal Conjugate-13 01/27/2015  . Pneumococcal Polysaccharide-23 07/25/2009  . Td 07/25/2009  . Tdap 12/22/2011   Pertinent  Health Maintenance Due  Topic Date Due  . FOOT EXAM  11/18/1950  . URINE MICROALBUMIN  11/18/1949  . OPHTHALMOLOGY EXAM  10/23/2015  . INFLUENZA VACCINE  10/22/2016  . HEMOGLOBIN A1C  01/16/2017  .  MAMMOGRAM  07/14/2017  . DEXA SCAN  Completed  . PNA vac Low Risk Adult  Completed   Fall Risk  06/09/2016 06/09/2016 02/05/2016 11/02/2015 05/01/2015  Falls in the past year? No No Yes No No  Number falls in past yr: - - 1 - -  Injury with Fall? - - No - -   Functional Status Survey:    Vitals:   07/29/16 1222  BP: (!) 93/57  Pulse: 72  Resp: 20  Temp: 98.1 F (36.7 C)  Weight: 101 lb 14.4 oz (46.2 kg)  Height: 5\' 1"  (1.549 m)   Body mass index is 19.25 kg/m. Physical Exam  Constitutional: She appears well-developed. No distress.  thin  HENT:  Right Ear: External ear normal.  Left Ear: External ear normal.  Nose: Nose normal.  Mouth/Throat: Oropharynx is clear and moist. No  oropharyngeal exudate.  Wearing hearing aids bilaterally. Previous exam showed EAC and TM normal bilaterally.  Eyes: Conjunctivae and EOM are normal. Pupils are equal, round, and reactive to light. No scleral icterus.  Prescription lenses  Neck: No JVD present. No tracheal deviation present. No thyromegaly present.  Cardiovascular: Normal rate, regular rhythm and intact distal pulses.  Exam reveals no gallop and no friction rub.   Murmur heard. Pulmonary/Chest: Effort normal. No respiratory distress. She has no wheezes. She has no rales. She exhibits no tenderness.  Abdominal: Soft. Bowel sounds are normal. She exhibits no distension and no mass. There is no tenderness.  Large diastasis recti  Musculoskeletal: Normal range of motion. She exhibits edema and tenderness.  Kyphosis. Gait instability. HX Right wrist fx,   Lymphadenopathy:    She has no cervical adenopathy.  Neurological: She is alert. No cranial nerve deficit. She exhibits abnormal muscle tone. Coordination abnormal.  Mild tremor of extended arms. Left sided weakness with main effect on the left leg. Confused.  Skin: No rash noted. She is not diaphoretic. No erythema. No pallor.  R+L buttock superficial pressure ulcers.   Psychiatric: She has a normal mood and affect. Her behavior is normal. Judgment and thought content normal.    Labs reviewed:  Recent Labs  11/28/15 0849  11/30/15 1941 12/01/15 0224  03/26/16 0443 03/27/16 2230 04/23/16 1127 07/17/16 07/21/16  NA 131*  < > 127* 132*  < > 133* 130* 132* 130* 127*  K 3.3*  < > 3.3* 3.0*  < > 3.6 3.8 3.8 3.1* 3.3*  CL 93*  < > 95* 98*  < > 94* 89* 84*  --   --   CO2 27  < > 23 27  < > 27 28 29   --   --   GLUCOSE 171*  < > 229* 102*  < > 171* 154* 226*  --   --   BUN 14  < > 14 12  < > 18 19 28* 19 20  CREATININE 0.93  < > 0.97 0.95  < > 1.11* 1.09* 1.00 0.9 1.0  CALCIUM 8.4*  < > 8.1* 8.2*  < > 9.4 11.3* 9.4  --   --   MG 2.6*  --  1.8 1.8  --   --   --   --   --    --   < > = values in this interval not displayed.  Recent Labs  11/26/15 1013 11/27/15 0243  03/13/16 03/23/16 1750 07/17/16  AST 21 18  < > 14 28 13   ALT 20 16  < > 14 18 13  ALKPHOS 64 57  < > 60 75 120  BILITOT 0.8 0.9  --   --  0.8  --   PROT 7.3 6.1*  --   --  7.9  --   ALBUMIN 4.0 3.4*  --   --  3.7  --   < > = values in this interval not displayed.  Recent Labs  11/26/15 1013  03/23/16 1750  03/25/16 0508 03/26/16 0443 03/27/16 2230 04/23/16 1127  WBC 13.3*  < > 10.4  < > 6.0 6.3 9.1 15.8*  NEUTROABS 11.3*  --  8.6*  --   --   --  7.1  --   HGB 13.4  < > 14.1  < > 11.7* 13.0 13.9  --   HCT 39.1  < > 42.0  < > 35.6* 38.3 40.1 34.4  MCV 88.1  < > 93.3  < > 92.5 91.4 90.3 94  PLT 209  < > 187  < > 165 182 206 218  < > = values in this interval not displayed. Lab Results  Component Value Date   TSH 1.573 03/24/2016   Lab Results  Component Value Date   HGBA1C 6.0 07/17/2016   Lab Results  Component Value Date   CHOL 175 03/24/2016   HDL 66 03/24/2016   LDLCALC 78 03/24/2016   TRIG 154 (H) 03/24/2016   CHOLHDL 2.7 03/24/2016    Significant Diagnostic Results in last 30 days:  Dg Ribs Bilateral W/chest  Result Date: 07/25/2016 CLINICAL DATA:  Bilateral rib and low back pain for 1 week. EXAM: BILATERAL RIBS AND CHEST - 4+ VIEW COMPARISON:  Chest radiograph 03/27/2016 FINDINGS: Healed posterior right seventh and 10 rib fractures. Probably acute nondisplaced left tenth posterior rib fracture. No evidence of pneumothorax. Cardiomediastinal silhouette is mildly enlarged. Calcific atherosclerotic disease of the aorta seen. No evidence of focal consolidation. IMPRESSION: Probably acute nondisplaced left tenth posterior rib fracture. Electronically Signed   By: Fidela Salisbury M.D.   On: 07/25/2016 12:12    Assessment/Plan Paronychia of great toe, left R+L great toes,  small amount of yellow drainage and mild erythema noted at ht edge of nail beds, recent trimmed  toenails. Pain when examined.  Apply Bactroban ointment bid x 2 weeks, open toe shoes until healed. Observe.   Essential hypertension Controlled. 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   Paroxysmal atrial fibrillation (HCC) Heart rate is in controlled, continue Metoprolol 25mg  bid and Diltiazem 90mg  bid, 07/18/16 Na 130, K 4.1, Bun 19, creat 0.86, Hgb a1c 6.0   GERD (gastroesophageal reflux disease) Stable, continue Ranitidine 150mg  qd  Constipation Stable, continue Senna to I tid  DM (diabetes mellitus), type 2, uncontrolled w/neurologic complication ([place]) Diet controlled, Hgb a1c 6.0 07/18/16  Vascular dementia 07/25/16 MMSE  16/28 SNF for care assistance, may consider memory preserving meds.   UTI (urinary tract infection) TMP for UTI suppression.   Urinary frequency Persists, continue [name] 25mg  daily. Observe the patient.   Polymyalgia rheumatica (HCC) Managed with Prednisone 5mg  daily   Hyponatremia Stable, Na 130s,  last Na 132 04/23/16  Anemia Update CBC  Psychosis Resolved, off meds.   History of rib fracture 07/24/16 X-ray [facility] healing fracture of the right ninth rib, the distal right rib seen 07/25/16 X-ray ribs at Imaging center. Probably acute nondisplaced left tenth posterior rib fracture. No pain upon my examination today, observe, prn Tylenol available to her.  Family/ staff Communication: SNF  Labs/tests ordered: CBC

## 2016-07-29 NOTE — Assessment & Plan Note (Signed)
Stable, continue Ranitidine 150mg qd.  

## 2016-07-31 DIAGNOSIS — D649 Anemia, unspecified: Secondary | ICD-10-CM | POA: Diagnosis not present

## 2016-07-31 LAB — CBC AND DIFFERENTIAL
HEMATOCRIT: 29 % — AB (ref 36–46)
HEMOGLOBIN: 9.2 g/dL — AB (ref 12.0–16.0)
PLATELETS: 229 10*3/uL (ref 150–399)
WBC: 5.5 10*3/mL

## 2016-08-01 ENCOUNTER — Other Ambulatory Visit: Payer: Self-pay | Admitting: *Deleted

## 2016-08-04 DIAGNOSIS — E871 Hypo-osmolality and hyponatremia: Secondary | ICD-10-CM | POA: Diagnosis not present

## 2016-08-04 DIAGNOSIS — D529 Folate deficiency anemia, unspecified: Secondary | ICD-10-CM | POA: Diagnosis not present

## 2016-08-04 DIAGNOSIS — D649 Anemia, unspecified: Secondary | ICD-10-CM | POA: Diagnosis not present

## 2016-08-04 DIAGNOSIS — M6281 Muscle weakness (generalized): Secondary | ICD-10-CM | POA: Diagnosis not present

## 2016-08-04 DIAGNOSIS — D510 Vitamin B12 deficiency anemia due to intrinsic factor deficiency: Secondary | ICD-10-CM | POA: Diagnosis not present

## 2016-08-08 ENCOUNTER — Emergency Department (HOSPITAL_COMMUNITY): Payer: Medicare Other

## 2016-08-08 ENCOUNTER — Encounter (HOSPITAL_COMMUNITY): Payer: Self-pay | Admitting: Emergency Medicine

## 2016-08-08 ENCOUNTER — Inpatient Hospital Stay (HOSPITAL_COMMUNITY)
Admission: EM | Admit: 2016-08-08 | Discharge: 2016-08-21 | DRG: 871 | Disposition: A | Discharge status: Skilled Nursing Facility | Payer: Medicare Other | Attending: Internal Medicine | Admitting: Internal Medicine

## 2016-08-08 DIAGNOSIS — R4182 Altered mental status, unspecified: Secondary | ICD-10-CM | POA: Diagnosis not present

## 2016-08-08 DIAGNOSIS — E43 Unspecified severe protein-calorie malnutrition: Secondary | ICD-10-CM | POA: Diagnosis present

## 2016-08-08 DIAGNOSIS — I6350 Cerebral infarction due to unspecified occlusion or stenosis of unspecified cerebral artery: Secondary | ICD-10-CM

## 2016-08-08 DIAGNOSIS — R6520 Severe sepsis without septic shock: Secondary | ICD-10-CM | POA: Diagnosis present

## 2016-08-08 DIAGNOSIS — Z8249 Family history of ischemic heart disease and other diseases of the circulatory system: Secondary | ICD-10-CM

## 2016-08-08 DIAGNOSIS — Z7901 Long term (current) use of anticoagulants: Secondary | ICD-10-CM

## 2016-08-08 DIAGNOSIS — L8990 Pressure ulcer of unspecified site, unspecified stage: Secondary | ICD-10-CM | POA: Insufficient documentation

## 2016-08-08 DIAGNOSIS — Z681 Body mass index (BMI) 19 or less, adult: Secondary | ICD-10-CM

## 2016-08-08 DIAGNOSIS — IMO0002 Reserved for concepts with insufficient information to code with codable children: Secondary | ICD-10-CM | POA: Diagnosis present

## 2016-08-08 DIAGNOSIS — Z8612 Personal history of poliomyelitis: Secondary | ICD-10-CM

## 2016-08-08 DIAGNOSIS — K219 Gastro-esophageal reflux disease without esophagitis: Secondary | ICD-10-CM | POA: Diagnosis present

## 2016-08-08 DIAGNOSIS — Z9842 Cataract extraction status, left eye: Secondary | ICD-10-CM

## 2016-08-08 DIAGNOSIS — J918 Pleural effusion in other conditions classified elsewhere: Secondary | ICD-10-CM | POA: Diagnosis not present

## 2016-08-08 DIAGNOSIS — Z66 Do not resuscitate: Secondary | ICD-10-CM | POA: Diagnosis present

## 2016-08-08 DIAGNOSIS — E1149 Type 2 diabetes mellitus with other diabetic neurological complication: Secondary | ICD-10-CM | POA: Diagnosis present

## 2016-08-08 DIAGNOSIS — R29715 NIHSS score 15: Secondary | ICD-10-CM | POA: Diagnosis not present

## 2016-08-08 DIAGNOSIS — R4701 Aphasia: Secondary | ICD-10-CM

## 2016-08-08 DIAGNOSIS — R1314 Dysphagia, pharyngoesophageal phase: Secondary | ICD-10-CM | POA: Diagnosis present

## 2016-08-08 DIAGNOSIS — F411 Generalized anxiety disorder: Secondary | ICD-10-CM | POA: Diagnosis not present

## 2016-08-08 DIAGNOSIS — G8194 Hemiplegia, unspecified affecting left nondominant side: Secondary | ICD-10-CM | POA: Diagnosis present

## 2016-08-08 DIAGNOSIS — H9190 Unspecified hearing loss, unspecified ear: Secondary | ICD-10-CM | POA: Diagnosis present

## 2016-08-08 DIAGNOSIS — Z833 Family history of diabetes mellitus: Secondary | ICD-10-CM

## 2016-08-08 DIAGNOSIS — K560 Paralytic ileus: Secondary | ICD-10-CM | POA: Diagnosis not present

## 2016-08-08 DIAGNOSIS — I69320 Aphasia following cerebral infarction: Secondary | ICD-10-CM

## 2016-08-08 DIAGNOSIS — L89153 Pressure ulcer of sacral region, stage 3: Secondary | ICD-10-CM | POA: Diagnosis present

## 2016-08-08 DIAGNOSIS — K567 Ileus, unspecified: Secondary | ICD-10-CM

## 2016-08-08 DIAGNOSIS — R140 Abdominal distension (gaseous): Secondary | ICD-10-CM

## 2016-08-08 DIAGNOSIS — G9341 Metabolic encephalopathy: Secondary | ICD-10-CM | POA: Diagnosis not present

## 2016-08-08 DIAGNOSIS — Z7189 Other specified counseling: Secondary | ICD-10-CM

## 2016-08-08 DIAGNOSIS — I472 Ventricular tachycardia: Secondary | ICD-10-CM | POA: Diagnosis present

## 2016-08-08 DIAGNOSIS — J189 Pneumonia, unspecified organism: Secondary | ICD-10-CM | POA: Diagnosis not present

## 2016-08-08 DIAGNOSIS — I69354 Hemiplegia and hemiparesis following cerebral infarction affecting left non-dominant side: Secondary | ICD-10-CM | POA: Diagnosis not present

## 2016-08-08 DIAGNOSIS — E1165 Type 2 diabetes mellitus with hyperglycemia: Secondary | ICD-10-CM

## 2016-08-08 DIAGNOSIS — F0150 Vascular dementia without behavioral disturbance: Secondary | ICD-10-CM | POA: Diagnosis present

## 2016-08-08 DIAGNOSIS — E876 Hypokalemia: Secondary | ICD-10-CM | POA: Diagnosis present

## 2016-08-08 DIAGNOSIS — E785 Hyperlipidemia, unspecified: Secondary | ICD-10-CM | POA: Diagnosis present

## 2016-08-08 DIAGNOSIS — I480 Paroxysmal atrial fibrillation: Secondary | ICD-10-CM | POA: Diagnosis present

## 2016-08-08 DIAGNOSIS — Z7982 Long term (current) use of aspirin: Secondary | ICD-10-CM

## 2016-08-08 DIAGNOSIS — R000 Tachycardia, unspecified: Secondary | ICD-10-CM | POA: Diagnosis not present

## 2016-08-08 DIAGNOSIS — Z7952 Long term (current) use of systemic steroids: Secondary | ICD-10-CM

## 2016-08-08 DIAGNOSIS — E872 Acidosis: Secondary | ICD-10-CM | POA: Diagnosis present

## 2016-08-08 DIAGNOSIS — A419 Sepsis, unspecified organism: Secondary | ICD-10-CM | POA: Diagnosis not present

## 2016-08-08 DIAGNOSIS — J690 Pneumonitis due to inhalation of food and vomit: Secondary | ICD-10-CM | POA: Diagnosis present

## 2016-08-08 DIAGNOSIS — R29810 Facial weakness: Secondary | ICD-10-CM | POA: Diagnosis present

## 2016-08-08 DIAGNOSIS — Z823 Family history of stroke: Secondary | ICD-10-CM

## 2016-08-08 DIAGNOSIS — Z85820 Personal history of malignant melanoma of skin: Secondary | ICD-10-CM

## 2016-08-08 DIAGNOSIS — N390 Urinary tract infection, site not specified: Secondary | ICD-10-CM | POA: Diagnosis present

## 2016-08-08 DIAGNOSIS — Z87440 Personal history of urinary (tract) infections: Secondary | ICD-10-CM

## 2016-08-08 DIAGNOSIS — M353 Polymyalgia rheumatica: Secondary | ICD-10-CM | POA: Diagnosis present

## 2016-08-08 DIAGNOSIS — T17908A Unspecified foreign body in respiratory tract, part unspecified causing other injury, initial encounter: Secondary | ICD-10-CM

## 2016-08-08 DIAGNOSIS — I639 Cerebral infarction, unspecified: Secondary | ICD-10-CM

## 2016-08-08 DIAGNOSIS — E1136 Type 2 diabetes mellitus with diabetic cataract: Secondary | ICD-10-CM | POA: Diagnosis present

## 2016-08-08 DIAGNOSIS — Z515 Encounter for palliative care: Secondary | ICD-10-CM | POA: Diagnosis not present

## 2016-08-08 DIAGNOSIS — I10 Essential (primary) hypertension: Secondary | ICD-10-CM | POA: Diagnosis present

## 2016-08-08 DIAGNOSIS — Z8673 Personal history of transient ischemic attack (TIA), and cerebral infarction without residual deficits: Secondary | ICD-10-CM

## 2016-08-08 DIAGNOSIS — M8580 Other specified disorders of bone density and structure, unspecified site: Secondary | ICD-10-CM | POA: Diagnosis present

## 2016-08-08 DIAGNOSIS — K5190 Ulcerative colitis, unspecified, without complications: Secondary | ICD-10-CM | POA: Diagnosis present

## 2016-08-08 LAB — CBC WITH DIFFERENTIAL/PLATELET
Basophils Absolute: 0 10*3/uL (ref 0.0–0.1)
Basophils Relative: 0 %
EOS ABS: 0 10*3/uL (ref 0.0–0.7)
Eosinophils Relative: 0 %
HEMATOCRIT: 34.3 % — AB (ref 36.0–46.0)
HEMOGLOBIN: 11 g/dL — AB (ref 12.0–15.0)
LYMPHS ABS: 1.1 10*3/uL (ref 0.7–4.0)
Lymphocytes Relative: 10 %
MCH: 28.9 pg (ref 26.0–34.0)
MCHC: 32.1 g/dL (ref 30.0–36.0)
MCV: 90 fL (ref 78.0–100.0)
Monocytes Absolute: 1 10*3/uL (ref 0.1–1.0)
Monocytes Relative: 9 %
NEUTROS ABS: 8.9 10*3/uL — AB (ref 1.7–7.7)
NEUTROS PCT: 81 %
Platelets: 281 10*3/uL (ref 150–400)
RBC: 3.81 MIL/uL — AB (ref 3.87–5.11)
RDW: 14.5 % (ref 11.5–15.5)
WBC: 11 10*3/uL — AB (ref 4.0–10.5)

## 2016-08-08 LAB — COMPREHENSIVE METABOLIC PANEL
ALT: 14 U/L (ref 14–54)
ANION GAP: 16 — AB (ref 5–15)
AST: 26 U/L (ref 15–41)
Albumin: 3.8 g/dL (ref 3.5–5.0)
Alkaline Phosphatase: 159 U/L — ABNORMAL HIGH (ref 38–126)
BUN: 18 mg/dL (ref 6–20)
CHLORIDE: 90 mmol/L — AB (ref 101–111)
CO2: 25 mmol/L (ref 22–32)
CREATININE: 0.83 mg/dL (ref 0.44–1.00)
Calcium: 9.7 mg/dL (ref 8.9–10.3)
Glucose, Bld: 170 mg/dL — ABNORMAL HIGH (ref 65–99)
POTASSIUM: 4.3 mmol/L (ref 3.5–5.1)
SODIUM: 131 mmol/L — AB (ref 135–145)
Total Bilirubin: 0.4 mg/dL (ref 0.3–1.2)
Total Protein: 7.6 g/dL (ref 6.5–8.1)

## 2016-08-08 NOTE — ED Triage Notes (Addendum)
Patient was having a high heart rate. Facility sent patient in due to her having a history of asymptomatic A Fib.

## 2016-08-08 NOTE — ED Notes (Signed)
Bed: LZ76 Expected date:  Expected time:  Means of arrival:  Comments: EMS uncontrolled atrial fib cardizem

## 2016-08-08 NOTE — ED Notes (Signed)
12 lead EKG completed and given to Dr Ayesha Rumpf

## 2016-08-08 NOTE — ED Provider Notes (Signed)
By signing my name below, I, Margit Banda, attest that this documentation has been prepared under the direction and in the presence of Ward, Delice Bison, DO. Electronically Signed: Margit Banda, ED Scribe. 08/08/16. 11:52 PM.  TIME SEEN: 11:52 PM  CHIEF COMPLAINT: Atrial Fibrillation  HPI:  Tammy Beard is a 76 y.o. female with history of hypertension, hyperlipidemia, previous stroke, cognitive delay, deafness who presents to the Emergency Department complaining of an increased HR that started earlier in the day. Pt's brother notes that her HR was ~ 133. He reports she had a stroke about ~ 6 months ago, which affected her left side. She currently lives at [facility]. Pt's brother notes she has had a productive cough that started early today. She is not currently in any pain. The brother notes she seems more out of it than normal. She normally can carry a conversation, but today has only said a few words. Pt denies fever.  ROS: Abilify acuity of her altered mental statusLevel V caveat for AMS   PAST MEDICAL HISTORY/PAST SURGICAL HISTORY:  Past Medical History:  Diagnosis Date  . Colitis   . Ecchymosis 11/15/2015  . Fracture of right radius 02/2016  . GERD (gastroesophageal reflux disease)   . Heart murmur   . Hyperlipidemia   . Hypertension   . Osteopenia    s/p 7 yrs Fosamax  . Polio   . Polymyalgia ([place])   . Rheumatic fever   . Sleep disorder   . Stroke ([place])   . Ulcerative colitis     MEDICATIONS:  Prior to Admission medications   Medication Sig Start Date End Date Taking? Authorizing Provider  acetaminophen (TYLENOL) 325 MG tablet Take 650 mg by mouth every 6 (six) hours as needed for mild pain or moderate pain.    Yes [provider]  apixaban (ELIQUIS) 5 MG TABS tablet Take 1 tablet (5 mg total) by mouth 2 (two) times daily. 03/27/16  Yes Geradine Girt, DO  aspirin 81 MG chewable tablet Chew 81 mg by mouth daily.   Yes [provider]   Cranberry-Vitamin C-Inulin (UTI-STAT) LIQD Take 30 mLs by mouth 2 (two) times daily.   Yes [provider]  diltiazem (CARDIZEM) 90 MG tablet Take 90 mg by mouth every 12 (twelve) hours.    Yes [provider]  divalproex (DEPAKOTE SPRINKLE) 125 MG capsule Take 125 mg by mouth daily. 08/08/16  Yes [provider]  ferrous sulfate 325 (65 FE) MG EC tablet Take 325 mg by mouth every evening.   Yes [provider]  metoprolol (LOPRESSOR) 50 MG tablet Take 0.5 tablets (25 mg total) by mouth 2 (two) times daily. 03/27/16  Yes Vann, Jessica U, DO  mirabegron ER (MYRBETRIQ) 25 MG TB24 tablet Take 25 mg by mouth daily.   Yes [provider]  potassium chloride SA (K-DUR,KLOR-CON) 20 MEQ tablet Take 20 mEq by mouth daily.    Yes [provider]  predniSONE (DELTASONE) 5 MG tablet ONE TABLETs DAILY TO TREAT POLYMYALGIA RHEUMATICA Patient taking differently: Take 5 mg by mouth daily with breakfast. TO TREAT POLYMYALGIA RHEUMATICA 07/14/16  Yes [name], MD  ranitidine (ZANTAC) 150 MG tablet Take 150 mg by mouth at bedtime. Take one tablet at bedtime    Yes [provider]  senna (SENOKOT) 8.6 MG tablet Take 1 tablet by mouth daily.    Yes [provider]  trimethoprim (TRIMPEX) 100 MG tablet Take 100 mg by mouth  daily.   Yes [provider]  zinc oxide 20 % ointment Apply 1 application topically as needed for irritation.   Yes [provider]    ALLERGIES:  No Known Allergies  SOCIAL HISTORY:  Social History  Substance Use Topics  . Smoking status: Never Smoker  . Smokeless tobacco: Never Used  . Alcohol use Yes     Comment: occasional once a year    FAMILY HISTORY: Family History  Problem Relation Age of Onset  . Pancreatic cancer Other   . Other Mother        Coad/ Bronchiectosis  . Diabetes Paternal Grandmother   . Heart failure Paternal Grandmother   . Cancer Paternal Aunt        unaware of  primary site  . Coronary artery disease Neg Hx   . Colon cancer Neg Hx   . Rectal cancer Neg Hx   . Stomach cancer Neg Hx   . Heart attack Neg Hx     EXAM: BP (!) 173/89 (BP Location: Right Arm)   Pulse 92   Temp 98.7 F (37.1 C) (Oral)   Resp 20   Ht 5\' 1"  (1.549 m)   Wt 101 lb (45.8 kg)   SpO2 98%   BMI 19.08 kg/m  CONSTITUTIONAL: Alert and responds To some questions but does not answer all questions or follow commands.  HEAD: Normocephalic EYES: Conjunctivae clear, pupils appear equal, EOMI ENT: normal nose; moist mucous membranes NECK: Supple, no meningismus, no nuchal rigidity, no LAD  CARD: RRR; S1 and S2 appreciated; no murmurs, no clicks, no rubs, no gallops RESP: Normal chest excursion without splinting or tachypnea; breath sounds clear and equal bilaterally; no wheezes, no rhonchi, no rales, no hypoxia or respiratory distress, speaking full sentences ABD/GI: Normal bowel sounds; non-distended; soft, non-tender, no rebound, no guarding, no peritoneal signs, no hepatosplenomegaly BACK:  The back appears normal and is non-tender to palpation, there is no CVA tenderness EXT: Normal ROM in all joints; non-tender to palpation; no edema; normal capillary refill; no cyanosis, no calf tenderness or swelling    SKIN: Normal color for age and race; warm; no rash NEUROleft-sided weakness and mild left facial droop noted. Patient not cooperative with examination.  MEDICAL DECISION MAKING: Patient here with cough for the past day and tachycardia. Nursing home sent her over here for possible atrial fibrillation. Brother reports that she has a known history of atrial fibrillation. Here she appears to be in a sinus rhythm with frequent PACs and PVCs. No atrial fibrillation. Heart rate in the 90s. He does report cough over the past day. Will check a rectal temperature. Labs show mild leukocytosis with left shift and chest x-ray shows possible by lateral infiltrates. Given she lives in a  skilled nursing facility will treat for healthcare associated pneumonia. We'll obtain cultures, urine, lactate. Also given her altered mental status we'll obtain a head CT. Last seen normal yesterday. No obvious new focal neurologic deficit on exam. Cephalopathy may be related to infection.   ED PROGRESS:  Patient's labs show elevated lactate. Heart rate slightly elevated now. Rectal temperature 100.1. TYLENOL. Will discuss with medicine for admission. CT head shows no acute abnormality. Patient's brother updated at bedtime. He confirms the patient is a DO NOT RESUSCITATE/DO NOT RESUSCITATE but he would want admission, IV fluids, IV antibiotics.   2:06 AM Discussed patient's case with hospitalist, Dr. Eulas Post.  I have recommended admission and patient (and family if present) agree with this plan. Admitting  physician will place admission orders.   I reviewed all nursing notes, vitals, pertinent previous records, EKGs, lab and urine results, imaging (as available).   EKG Interpretation  Date/Time:  Saturday Aug 09 2016 00:05:59 EDT Ventricular Rate:  92 PR Interval:    QRS Duration: 87 QT Interval:  399 QTC Calculation: 494 R Axis:   76 Text Interpretation:  Sinus tachycardia Multiple premature complexes, vent & supraven Consider left ventricular hypertrophy Repol abnrm suggests ischemia, inferior leads Confirmed by WARD,  DO, KRISTEN ([id number]) on 08/09/2016 6:51:59 AM        I personally performed the services described in this documentation, which was scribed in my presence. The recorded information has been reviewed and is accurate.     Ward, Delice Bison, DO 08/09/16 [phone]

## 2016-08-09 ENCOUNTER — Emergency Department (HOSPITAL_COMMUNITY): Payer: Medicare Other

## 2016-08-09 DIAGNOSIS — I10 Essential (primary) hypertension: Secondary | ICD-10-CM | POA: Diagnosis not present

## 2016-08-09 DIAGNOSIS — I472 Ventricular tachycardia: Secondary | ICD-10-CM | POA: Diagnosis not present

## 2016-08-09 DIAGNOSIS — G8194 Hemiplegia, unspecified affecting left nondominant side: Secondary | ICD-10-CM

## 2016-08-09 DIAGNOSIS — F0150 Vascular dementia without behavioral disturbance: Secondary | ICD-10-CM | POA: Diagnosis not present

## 2016-08-09 DIAGNOSIS — G9341 Metabolic encephalopathy: Secondary | ICD-10-CM

## 2016-08-09 DIAGNOSIS — Z7982 Long term (current) use of aspirin: Secondary | ICD-10-CM | POA: Diagnosis not present

## 2016-08-09 DIAGNOSIS — Z823 Family history of stroke: Secondary | ICD-10-CM | POA: Diagnosis not present

## 2016-08-09 DIAGNOSIS — K560 Paralytic ileus: Secondary | ICD-10-CM | POA: Diagnosis not present

## 2016-08-09 DIAGNOSIS — Z7901 Long term (current) use of anticoagulants: Secondary | ICD-10-CM | POA: Diagnosis not present

## 2016-08-09 DIAGNOSIS — E872 Acidosis: Secondary | ICD-10-CM | POA: Diagnosis not present

## 2016-08-09 DIAGNOSIS — Z515 Encounter for palliative care: Secondary | ICD-10-CM | POA: Diagnosis not present

## 2016-08-09 DIAGNOSIS — R4182 Altered mental status, unspecified: Secondary | ICD-10-CM | POA: Diagnosis not present

## 2016-08-09 DIAGNOSIS — K567 Ileus, unspecified: Secondary | ICD-10-CM | POA: Diagnosis not present

## 2016-08-09 DIAGNOSIS — K5190 Ulcerative colitis, unspecified, without complications: Secondary | ICD-10-CM | POA: Diagnosis not present

## 2016-08-09 DIAGNOSIS — T17908A Unspecified foreign body in respiratory tract, part unspecified causing other injury, initial encounter: Secondary | ICD-10-CM | POA: Diagnosis not present

## 2016-08-09 DIAGNOSIS — M6281 Muscle weakness (generalized): Secondary | ICD-10-CM | POA: Diagnosis not present

## 2016-08-09 DIAGNOSIS — Z8673 Personal history of transient ischemic attack (TIA), and cerebral infarction without residual deficits: Secondary | ICD-10-CM

## 2016-08-09 DIAGNOSIS — R1314 Dysphagia, pharyngoesophageal phase: Secondary | ICD-10-CM | POA: Diagnosis not present

## 2016-08-09 DIAGNOSIS — Z7189 Other specified counseling: Secondary | ICD-10-CM | POA: Diagnosis not present

## 2016-08-09 DIAGNOSIS — R6520 Severe sepsis without septic shock: Secondary | ICD-10-CM

## 2016-08-09 DIAGNOSIS — J690 Pneumonitis due to inhalation of food and vomit: Secondary | ICD-10-CM

## 2016-08-09 DIAGNOSIS — L89153 Pressure ulcer of sacral region, stage 3: Secondary | ICD-10-CM | POA: Diagnosis not present

## 2016-08-09 DIAGNOSIS — M8580 Other specified disorders of bone density and structure, unspecified site: Secondary | ICD-10-CM | POA: Diagnosis present

## 2016-08-09 DIAGNOSIS — K5900 Constipation, unspecified: Secondary | ICD-10-CM | POA: Diagnosis not present

## 2016-08-09 DIAGNOSIS — K219 Gastro-esophageal reflux disease without esophagitis: Secondary | ICD-10-CM | POA: Diagnosis present

## 2016-08-09 DIAGNOSIS — A419 Sepsis, unspecified organism: Secondary | ICD-10-CM

## 2016-08-09 DIAGNOSIS — E43 Unspecified severe protein-calorie malnutrition: Secondary | ICD-10-CM | POA: Diagnosis not present

## 2016-08-09 DIAGNOSIS — Z833 Family history of diabetes mellitus: Secondary | ICD-10-CM | POA: Diagnosis not present

## 2016-08-09 DIAGNOSIS — I480 Paroxysmal atrial fibrillation: Secondary | ICD-10-CM

## 2016-08-09 DIAGNOSIS — G9340 Encephalopathy, unspecified: Secondary | ICD-10-CM | POA: Diagnosis not present

## 2016-08-09 DIAGNOSIS — N390 Urinary tract infection, site not specified: Secondary | ICD-10-CM | POA: Diagnosis not present

## 2016-08-09 DIAGNOSIS — M353 Polymyalgia rheumatica: Secondary | ICD-10-CM | POA: Diagnosis not present

## 2016-08-09 DIAGNOSIS — R130 Aphagia: Secondary | ICD-10-CM | POA: Diagnosis not present

## 2016-08-09 DIAGNOSIS — Z681 Body mass index (BMI) 19 or less, adult: Secondary | ICD-10-CM | POA: Diagnosis not present

## 2016-08-09 DIAGNOSIS — E1165 Type 2 diabetes mellitus with hyperglycemia: Secondary | ICD-10-CM | POA: Diagnosis not present

## 2016-08-09 DIAGNOSIS — I69320 Aphasia following cerebral infarction: Secondary | ICD-10-CM | POA: Diagnosis not present

## 2016-08-09 DIAGNOSIS — I361 Nonrheumatic tricuspid (valve) insufficiency: Secondary | ICD-10-CM | POA: Diagnosis not present

## 2016-08-09 DIAGNOSIS — J189 Pneumonia, unspecified organism: Secondary | ICD-10-CM | POA: Diagnosis not present

## 2016-08-09 DIAGNOSIS — Z7952 Long term (current) use of systemic steroids: Secondary | ICD-10-CM | POA: Diagnosis not present

## 2016-08-09 DIAGNOSIS — E1143 Type 2 diabetes mellitus with diabetic autonomic (poly)neuropathy: Secondary | ICD-10-CM | POA: Diagnosis not present

## 2016-08-09 DIAGNOSIS — E785 Hyperlipidemia, unspecified: Secondary | ICD-10-CM | POA: Diagnosis present

## 2016-08-09 DIAGNOSIS — I69354 Hemiplegia and hemiparesis following cerebral infarction affecting left non-dominant side: Secondary | ICD-10-CM | POA: Diagnosis not present

## 2016-08-09 LAB — BASIC METABOLIC PANEL
ANION GAP: 13 (ref 5–15)
BUN: 15 mg/dL (ref 6–20)
CO2: 23 mmol/L (ref 22–32)
Calcium: 8 mg/dL — ABNORMAL LOW (ref 8.9–10.3)
Chloride: 96 mmol/L — ABNORMAL LOW (ref 101–111)
Creatinine, Ser: 0.74 mg/dL (ref 0.44–1.00)
GFR calc Af Amer: >60 mL/min (ref >60)
Glucose, Bld: 188 mg/dL — ABNORMAL HIGH (ref 65–99)
POTASSIUM: 2.9 mmol/L — AB (ref 3.5–5.1)
SODIUM: 132 mmol/L — AB (ref 135–145)

## 2016-08-09 LAB — URINALYSIS, ROUTINE W REFLEX MICROSCOPIC
BILIRUBIN URINE: NEGATIVE
Glucose, UA: NEGATIVE mg/dL
KETONES UR: NEGATIVE mg/dL
NITRITE: NEGATIVE
PH: 5 (ref 5.0–8.0)
Protein, ur: 30 mg/dL — AB
Specific Gravity, Urine: 1.014 (ref 1.005–1.030)
Squamous Epithelial / LPF: NONE SEEN

## 2016-08-09 LAB — CBC
HCT: 27.5 % — ABNORMAL LOW (ref 36.0–46.0)
Hemoglobin: 8.9 g/dL — ABNORMAL LOW (ref 12.0–15.0)
MCH: 29.3 pg (ref 26.0–34.0)
MCHC: 32.4 g/dL (ref 30.0–36.0)
MCV: 90.5 fL (ref 78.0–100.0)
Platelets: 242 10*3/uL (ref 150–400)
RBC: 3.04 MIL/uL — AB (ref 3.87–5.11)
RDW: 14.5 % (ref 11.5–15.5)
WBC: 11.8 10*3/uL — AB (ref 4.0–10.5)

## 2016-08-09 LAB — LACTIC ACID, PLASMA: LACTIC ACID, VENOUS: 2.7 mmol/L — AB (ref 0.5–1.9)

## 2016-08-09 LAB — VALPROIC ACID LEVEL: Valproic Acid Lvl: <10 ug/mL — ABNORMAL LOW (ref 50.0–100.0)

## 2016-08-09 LAB — MRSA PCR SCREENING: MRSA BY PCR: NEGATIVE

## 2016-08-09 LAB — PROCALCITONIN: Procalcitonin: <0.1 ng/mL

## 2016-08-09 LAB — MAGNESIUM: Magnesium: 1.7 mg/dL (ref 1.7–2.4)

## 2016-08-09 LAB — HIV ANTIBODY (ROUTINE TESTING W REFLEX): HIV Screen 4th Generation wRfx: NONREACTIVE

## 2016-08-09 LAB — I-STAT CG4 LACTIC ACID, ED: LACTIC ACID, VENOUS: 2.96 mmol/L — AB (ref 0.5–1.9)

## 2016-08-09 MED ORDER — DILTIAZEM HCL 25 MG/5ML IV SOLN
15.0000 mg | Freq: Once | INTRAVENOUS | Status: DC
  Filled 2016-08-09: qty 5

## 2016-08-09 MED ORDER — VANCOMYCIN HCL 500 MG IV SOLR
500.0000 mg | Freq: Two times a day (BID) | INTRAVENOUS | Status: DC
  Administered 2016-08-09 – 2016-08-10 (×2): 500 mg via INTRAVENOUS
  Filled 2016-08-09 (×2): qty 500

## 2016-08-09 MED ORDER — POTASSIUM CHLORIDE 10 MEQ/100ML IV SOLN
10.0000 meq | INTRAVENOUS | Status: AC
  Administered 2016-08-09 (×6): 10 meq via INTRAVENOUS
  Filled 2016-08-09 (×6): qty 100

## 2016-08-09 MED ORDER — METOPROLOL TARTRATE 5 MG/5ML IV SOLN
5.0000 mg | Freq: Four times a day (QID) | INTRAVENOUS | Status: DC
  Administered 2016-08-09 – 2016-08-10 (×4): 5 mg via INTRAVENOUS
  Filled 2016-08-09 (×4): qty 5

## 2016-08-09 MED ORDER — SENNA 8.6 MG PO TABS
1.0000 | ORAL_TABLET | Freq: Every day | ORAL | Status: DC
  Administered 2016-08-09 – 2016-08-13 (×5): 8.6 mg via ORAL
  Filled 2016-08-09 (×5): qty 1

## 2016-08-09 MED ORDER — DEXTROSE 5 % IV SOLN: 1.0000 g | Freq: Two times a day (BID) | INTRAVENOUS | Status: DC

## 2016-08-09 MED ORDER — DILTIAZEM HCL-DEXTROSE 100-5 MG/100ML-% IV SOLN (PREMIX)
5.0000 mg/h | INTRAVENOUS | Status: AC
Start: 2016-08-09 — End: 2016-08-10
  Administered 2016-08-09 – 2016-08-10 (×5): 15 mg/h via INTRAVENOUS
  Filled 2016-08-09 (×4): qty 100

## 2016-08-09 MED ORDER — MAGNESIUM SULFATE 2 GM/50ML IV SOLN
2.0000 g | Freq: Once | INTRAVENOUS | Status: AC
  Administered 2016-08-09: 2 g via INTRAVENOUS
  Filled 2016-08-09: qty 50

## 2016-08-09 MED ORDER — PIPERACILLIN-TAZOBACTAM 3.375 G IVPB
3.3750 g | Freq: Three times a day (TID) | INTRAVENOUS | Status: DC
  Administered 2016-08-09 – 2016-08-12 (×10): 3.375 g via INTRAVENOUS
  Filled 2016-08-09 (×10): qty 50

## 2016-08-09 MED ORDER — VANCOMYCIN HCL IN DEXTROSE 1-5 GM/200ML-% IV SOLN
1000.0000 mg | Freq: Once | INTRAVENOUS | Status: AC
  Administered 2016-08-09: 1000 mg via INTRAVENOUS
  Filled 2016-08-09: qty 200

## 2016-08-09 MED ORDER — DEXTROSE 5 % IV SOLN
2.0000 g | Freq: Once | INTRAVENOUS | Status: AC
  Administered 2016-08-09: 2 g via INTRAVENOUS
  Filled 2016-08-09: qty 2

## 2016-08-09 MED ORDER — TRIMETHOPRIM 100 MG PO TABS
100.0000 mg | ORAL_TABLET | Freq: Every day | ORAL | Status: DC
  Filled 2016-08-09: qty 1

## 2016-08-09 MED ORDER — SODIUM CHLORIDE 0.9 % IV BOLUS (SEPSIS)
1000.0000 mL | Freq: Once | INTRAVENOUS | Status: AC
  Administered 2016-08-09: 1000 mL via INTRAVENOUS

## 2016-08-09 MED ORDER — DILTIAZEM LOAD VIA INFUSION
15.0000 mg | Freq: Once | INTRAVENOUS | Status: AC
Start: 2016-08-09 — End: 2016-08-09
  Administered 2016-08-09: 15 mg via INTRAVENOUS
  Filled 2016-08-09: qty 15

## 2016-08-09 MED ORDER — SODIUM CHLORIDE 0.9 % IV BOLUS (SEPSIS)
500.0000 mL | Freq: Once | INTRAVENOUS | Status: AC
  Administered 2016-08-09: 500 mL via INTRAVENOUS

## 2016-08-09 MED ORDER — PREDNISONE 5 MG PO TABS
5.0000 mg | ORAL_TABLET | Freq: Every day | ORAL | Status: DC
  Administered 2016-08-10 – 2016-08-21 (×11): 5 mg via ORAL
  Filled 2016-08-09 (×12): qty 1

## 2016-08-09 MED ORDER — APIXABAN 5 MG PO TABS
5.0000 mg | ORAL_TABLET | Freq: Two times a day (BID) | ORAL | Status: DC
  Administered 2016-08-09 – 2016-08-21 (×23): 5 mg via ORAL
  Filled 2016-08-09 (×13): qty 1
  Filled 2016-08-09: qty 2
  Filled 2016-08-09 (×10): qty 1

## 2016-08-09 MED ORDER — DILTIAZEM HCL 30 MG PO TABS: 90.0000 mg | ORAL_TABLET | Freq: Two times a day (BID) | ORAL | Status: DC

## 2016-08-09 MED ORDER — MIRABEGRON ER 25 MG PO TB24
25.0000 mg | ORAL_TABLET | Freq: Every day | ORAL | Status: DC
  Administered 2016-08-09 – 2016-08-13 (×5): 25 mg via ORAL
  Filled 2016-08-09 (×5): qty 1

## 2016-08-09 MED ORDER — ASPIRIN 81 MG PO CHEW
81.0000 mg | CHEWABLE_TABLET | Freq: Every day | ORAL | Status: DC
  Administered 2016-08-09 – 2016-08-21 (×12): 81 mg via ORAL
  Filled 2016-08-09 (×12): qty 1

## 2016-08-09 MED ORDER — ACETAMINOPHEN 500 MG PO TABS
1000.0000 mg | ORAL_TABLET | Freq: Once | ORAL | Status: AC
  Administered 2016-08-09: 1000 mg via ORAL
  Filled 2016-08-09: qty 2

## 2016-08-09 MED ORDER — FAMOTIDINE 20 MG PO TABS
20.0000 mg | ORAL_TABLET | Freq: Every day | ORAL | Status: DC
  Administered 2016-08-10 – 2016-08-12 (×3): 20 mg via ORAL
  Filled 2016-08-09 (×3): qty 1

## 2016-08-09 MED ORDER — DIVALPROEX SODIUM 125 MG PO CSDR
125.0000 mg | DELAYED_RELEASE_CAPSULE | Freq: Every day | ORAL | Status: DC
  Administered 2016-08-09 – 2016-08-13 (×5): 125 mg via ORAL
  Filled 2016-08-09 (×5): qty 1

## 2016-08-09 MED ORDER — DILTIAZEM LOAD VIA INFUSION
10.0000 mg | Freq: Once | INTRAVENOUS | Status: AC
  Administered 2016-08-09: 10 mg via INTRAVENOUS
  Filled 2016-08-09: qty 10

## 2016-08-09 MED ORDER — METOPROLOL TARTRATE 25 MG PO TABS: 25.0000 mg | ORAL_TABLET | Freq: Two times a day (BID) | ORAL | Status: DC

## 2016-08-09 MED ORDER — SODIUM CHLORIDE 0.9 % IV SOLN
INTRAVENOUS | Status: DC
  Administered 2016-08-09 – 2016-08-12 (×6): via INTRAVENOUS

## 2016-08-09 NOTE — Evaluation (Signed)
Clinical/Bedside Swallow Evaluation Patient Details  Name: Tammy Beard MRN: [id number] Date of Birth: 20-Aug-1940  Today's Date: 08/09/2016 Time: SLP Start Time (ACUTE ONLY): 1200 SLP Stop Time (ACUTE ONLY): 1222 SLP Time Calculation (min) (ACUTE ONLY): 22 min  Past Medical History:  Past Medical History:  Diagnosis Date  . Colitis   . Ecchymosis 11/15/2015  . Fracture of right radius 02/2016  . GERD (gastroesophageal reflux disease)   . Heart murmur   . Hyperlipidemia   . Hypertension   . Osteopenia    s/p 7 yrs Fosamax  . Polio   . Polymyalgia ([place])   . Rheumatic fever   . Sleep disorder   . Stroke ([place])   . Ulcerative colitis    Past Surgical History:  Past Surgical History:  Procedure Laterality Date  . BREAST LUMPECTOMY  1986   left; benign  . CATARACT EXTRACTION     Left eye   . COLONOSCOPY  2006  . Colonosopy  07/05/13   Dr. Deatra Ina  . DILATION AND CURETTAGE OF UTERUS  12/1999  . MELANOMA EXCISION  2007   left arm  . NECK SURGERY  1986   Benign growth on neck removed  . 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 The patient is hard of hearing at baseline with cognitive impairment. Her brother reports that she was last known well two days ago; he visited her and she seemed behaving like her normal self. By the next day, she was demonstrating increased confusion and told him "I don't feel well". She has a new wet cough and tachycardia. Pt found to have mild sepsis secondary to pneumonia and UTI. Pt has a history of dysphagia, had an MBS on 05/08/16 that showed severe pharyngeal residuals across consistencies due  to multifactorial impairment. Pt recommended to consume thin liquids given that all textures pose a risk. Per her brother pt has been [facility] and has been consuming nectar thick liquids.  Assessment / Plan / Recommendation Clinical Impression  Pt demonstrates signs of dysphagia and aspiration across consistencies including prolonged oral holding with liquids and purees, suspected delayed swallow, immediate cough and persistent wet vocal quality despite cued throat clearing. Decreasing bolus size and verbal cues did not reduce signs. These findings are consistent with MBS on 2/15 and suggest that pt has now developed aspiration pneumonia as a result of chronic dysphagia. Thickened textures and therapeutic interventions have not prevented impact on lungs. Discussed with pts brother on the phone and reviewed limited options. Pts has poor quality of life and PO intake is one of her few remaining pleasures. Brother would like to discuss with his family and sister and MD. For now, pt to consume meds crushed in puree, ice chips after oral care. Suggest palliative care f/u. SLP will discuss plan with family as discussion evolves.   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 The patient is hard of hearing at baseline with cognitive impairment. Her brother reports that she was last known well two days ago; he visited her and she seemed behaving like her normal self. By the next day, she was demonstrating increased confusion and told him "I don't feel well". She has a new wet cough and tachycardia. Pt found to have mild sepsis secondary to pneumonia and UTI. Pt has a history of mild oral dysphagia secondary to decreased attention to PO, partially masticating solids while talking resulting in occasional choking. Pt has been evaluated at bedside many times with minimal concern for aspiration. She reportedly has hoarse vocal quality due to polio. Brother reported to RN that pt was on nectar thick liquids at the SNF prior to admission.  Type of Study: Bedside Swallow Evaluation Diet Prior to this Study: NPO Temperature Spikes Noted: No Respiratory Status: Nasal cannula History of Recent Intubation: No Behavior/Cognition: Alert;Cooperative Oral Cavity Assessment: Within Functional Limits Oral Care Completed by SLP: No Oral Cavity - Dentition: Adequate natural dentition Vision: Functional for self-feeding Self-Feeding Abilities: Able to feed self Patient Positioning: Upright in bed Baseline Vocal Quality: Wet Volitional Cough: Weak Volitional Swallow: Able to elicit    Oral/Motor/Sensory Function Overall Oral Motor/Sensory Function: Within functional limits   Ice Chips Ice chips: Within functional limits Presentation: Spoon   Thin Liquid Thin Liquid: Impaired Presentation: Cup;Self Fed Oral Phase Functional Implications: Oral holding;Prolonged oral transit Pharyngeal  Phase Impairments: Suspected delayed Swallow;Wet Vocal Quality;Cough - Immediate;Cough - Delayed    Nectar Thick Nectar Thick Liquid: Impaired Presentation: Cup;Straw Oral phase functional implications: Oral holding;Prolonged oral  transit Pharyngeal Phase Impairments: Suspected delayed Swallow;Cough - Immediate;Wet Vocal Quality   Honey Thick Honey Thick Liquid: Not tested   Puree Puree: Impaired Presentation: Spoon Pharyngeal Phase Impairments: Wet Vocal Quality   Solid   GO   Solid: Not tested       Herbie [place], MA CCC-SLP [phone]  Lynann Beaver 08/09/2016,12:25 PM

## 2016-08-09 NOTE — ED Notes (Signed)
Attempted lab draw for blood culture but unsuccessful. RN,Jeneen made aware.

## 2016-08-09 NOTE — H&P (Signed)
History and Physical    Tammy Beard TTS:[id number] DOB: 07-16-1940 DOA: 08/08/2016  PCP: [name], MD   Patient coming from: SNF  Chief Complaint: Tachycardia and mental status changes.  HPI: Tammy Beard is a 76 y.o. 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  The patient is hard of hearing at baseline with cognitive impairment.  Her brother reports that she was last known well two days ago; he visited her and she seemed behaving like her normal self.  By the next day, she was demonstrating increased confusion and told him "I don't feel well".  She has a new wet cough and tachycardia.  SNF personnel was concerned that she was in atrial fibrillation, so she was sent to the ED for further evaluation.  No reported fever.  No chest pain.  No shortness of breath.  No overt aspirations.  No syncope.  ED Course: Rectal temp 100.1.  Lactic acid level 2.96.  WBC count elevated at 11.  Hgb 11.  Sodium 131.  Chloride 90.  Alk phos 159.  Chest xray concerning for bibasilar opacities (atelectasis vs infiltrate) with small effusion and cardiomegaly. U/A shows large leukoctyes, TNTC WBC, and many bacteria.  Patient has received cefepimine and vancomycin.  Blood cultures are pending.  Hospitalist asked to admit.  Review of Systems: As per HPI otherwise 10 systems reviewed and negative.   Past Medical History:  Diagnosis Date  . Colitis   . Ecchymosis 11/15/2015  . Fracture of right radius 02/2016  . GERD (gastroesophageal reflux disease)   . Heart murmur   . Hyperlipidemia   . Hypertension   . Osteopenia    s/p 7 yrs Fosamax  . Polio   . Polymyalgia ([place])   . Rheumatic fever   .  Sleep disorder   . Stroke ([place])   . Ulcerative colitis     Past Surgical History:  Procedure Laterality Date  . BREAST LUMPECTOMY  1986   left; benign  . CATARACT EXTRACTION     Left eye   . COLONOSCOPY  2006  . Colonosopy  07/05/13   Dr. Deatra Ina  . DILATION AND CURETTAGE OF UTERUS  12/1999  . MELANOMA EXCISION  2007   left arm  . NECK SURGERY  1986   Benign growth on neck removed  . TONSILLECTOMY       reports that she has never smoked. She has never used smokeless tobacco. She reports that she drinks alcohol. She reports that she does not use drugs.  No Known Allergies  Family History  Problem Relation Age of Onset  . Pancreatic cancer Other   . Other Mother        Coad/ Bronchiectosis  . Diabetes Paternal Grandmother   . Heart failure Paternal Grandmother   . Cancer Paternal Aunt        unaware of primary site  . Coronary artery disease Neg Hx   . Colon cancer Neg Hx   . Rectal cancer Neg Hx   . Stomach cancer Neg Hx   . Heart attack Neg Hx      Prior to Admission medications   Medication Sig Start Date End Date Taking? Authorizing Provider  acetaminophen (TYLENOL) 325 MG tablet Take 650 mg by  mouth every 6 (six) hours as needed for mild pain or moderate pain.    Yes [provider]  apixaban (ELIQUIS) 5 MG TABS tablet Take 1 tablet (5 mg total) by mouth 2 (two) times daily. 03/27/16  Yes Geradine Girt, DO  aspirin 81 MG chewable tablet Chew 81 mg by mouth daily.   Yes [provider]  Cranberry-Vitamin C-Inulin (UTI-STAT) LIQD Take 30 mLs by mouth 2 (two) times daily.   Yes [provider]  diltiazem (CARDIZEM) 90 MG tablet Take 90 mg by mouth every 12 (twelve) hours.    Yes [provider]  divalproex (DEPAKOTE SPRINKLE) 125 MG capsule Take 125 mg by mouth daily. 08/08/16  Yes [provider]  ferrous sulfate 325 (65 FE) MG EC tablet Take 325 mg by mouth every evening.   Yes [provider]  metoprolol  (LOPRESSOR) 50 MG tablet Take 0.5 tablets (25 mg total) by mouth 2 (two) times daily. 03/27/16  Yes Vann, Jessica U, DO  mirabegron ER (MYRBETRIQ) 25 MG TB24 tablet Take 25 mg by mouth daily.   Yes [provider]  potassium chloride SA (K-DUR,KLOR-CON) 20 MEQ tablet Take 20 mEq by mouth daily.    Yes [provider]  predniSONE (DELTASONE) 5 MG tablet ONE TABLETs DAILY TO TREAT POLYMYALGIA RHEUMATICA Patient taking differently: Take 5 mg by mouth daily with breakfast. TO TREAT POLYMYALGIA RHEUMATICA 07/14/16  Yes [name], MD  ranitidine (ZANTAC) 150 MG tablet Take 150 mg by mouth at bedtime. Take one tablet at bedtime    Yes [provider]  senna (SENOKOT) 8.6 MG tablet Take 1 tablet by mouth daily.    Yes [provider]  trimethoprim (TRIMPEX) 100 MG tablet Take 100 mg by mouth daily.   Yes [provider]  zinc oxide 20 % ointment Apply 1 application topically as needed for irritation.   Yes [provider]    Physical Exam: Vitals:   08/09/16 0230 08/09/16 0300 08/09/16 0334 08/09/16 0400  BP: (!) 171/102 127/81 (!) 164/112 (!) 164/112  Pulse: 98 (!) 150 (!) 151   Resp: 16 13 (!) 21   Temp:   98 F (36.7 C) 98 F (36.7 C)  TempSrc:   Oral Oral  SpO2: 97% 95% 94%   Weight:      Height:   5' 1"  (1.549 m)       Constitutional: NAD, calm, chronically ill appearing, hard of hearing Vitals:   08/09/16 0230 08/09/16 0300 08/09/16 0334 08/09/16 0400  BP: (!) 171/102 127/81 (!) 164/112 (!) 164/112  Pulse: 98 (!) 150 (!) 151   Resp: 16 13 (!) 21   Temp:   98 F (36.7 C) 98 F (36.7 C)  TempSrc:   Oral Oral  SpO2: 97% 95% 94%   Weight:      Height:   5' 1"  (1.549 m)    Eyes: PERRL, lids and conjunctivae normal ENMT: Mucous membranes are moist. Posterior pharynx clear not visualized. Normal dentition.  Neck: normal appearance, supple, no masses Respiratory: Diminished bilaterally.  No apparent wheeze.  Normal  respiratory effort. No accessory muscle use.  Cardiovascular: Tachycardic and irregular.  No extremity edema. 2+ pedal pulses. GI: abdomen is soft and compressible.  No distention.  No tenderness.  Bowel sounds are present. Musculoskeletal:  Muscle atrophy in lower extremities.  Left sided hemiparesis.  Limited ROM on the left.    Skin: no rashes, pale, cool Neurologic: Limited by patient's ability  to cooperate.  Face appears symmetric.  Tongue midline.  Moves right sided extremities on command. Psychiatric: Oriented to person.  Flat affect.  Judgment and insight impaired.    Labs on Admission: I have personally reviewed following labs and imaging studies  CBC:  Recent Labs Lab 08/08/16 2224  WBC 11.0*  NEUTROABS 8.9*  HGB 11.0*  HCT 34.3*  MCV 90.0  PLT 094   Basic Metabolic Panel:  Recent Labs Lab 08/08/16 2224  NA 131*  K 4.3  CL 90*  CO2 25  GLUCOSE 170*  BUN 18  CREATININE 0.83  CALCIUM 9.7   GFR: Estimated Creatinine Clearance: 43.5 mL/min (by C-G formula based on SCr of 0.83 mg/dL). Liver Function Tests:  Recent Labs Lab 08/08/16 2224  AST 26  ALT 14  ALKPHOS 159*  BILITOT 0.4  PROT 7.6  ALBUMIN 3.8   Urine analysis:    Component Value Date/Time   COLORURINE YELLOW 08/09/2016 0055   APPEARANCEUR TURBID (A) 08/09/2016 0055   LABSPEC 1.014 08/09/2016 0055   PHURINE 5.0 08/09/2016 0055   GLUCOSEU NEGATIVE 08/09/2016 0055   HGBUR SMALL (A) 08/09/2016 0055   HGBUR negative 09/06/2009 1459   BILIRUBINUR NEGATIVE 08/09/2016 0055   BILIRUBINUR NEG 08/23/2010 1440   KETONESUR NEGATIVE 08/09/2016 0055   PROTEINUR 30 (A) 08/09/2016 0055   UROBILINOGEN 0.2 08/23/2010 1440   UROBILINOGEN 0.2 09/06/2009 1459   NITRITE NEGATIVE 08/09/2016 0055   LEUKOCYTESUR LARGE (A) 08/09/2016 0055   Sepsis Labs:  Lactic acid level 2.96  Radiological Exams on Admission: Dg Chest 2 View  Result Date: 08/08/2016 CLINICAL DATA:  Initial evaluation for atrial  fibrillation. History of hypertension, murmur. EXAM: CHEST  2 VIEW COMPARISON:  Prior radiograph from 07/25/2016. FINDINGS: Stable cardiomegaly. Mediastinal silhouette within normal limits. Aortic atherosclerosis. Chronic lung changes with emphysema and scarring. Small bilateral pleural effusions, greater on the left. Patchy bibasilar opacities, which may reflect atelectasis or infiltrates. No overt pulmonary edema. No pneumothorax. Irregular pleural thickening at the right lung apex is stable. No acute osseus abnormality. Exaggeration of the normal thoracic kyphosis, similar to previous. Subacute to chronic right-sided rib fractures. Osteopenia. IMPRESSION: 1. Small bilateral pleural effusions, left slightly larger than right. Mild patchy bibasilar opacities may reflect atelectasis or possibly infiltrates. 2. Stable cardiomegaly without pulmonary edema. 3. Aortic atherosclerosis. Electronically Signed   By: Jeannine Boga M.D.   On: 08/08/2016 23:05   Ct Head Wo Contrast  Result Date: 08/09/2016 CLINICAL DATA:  Altered mental status for 1 day. EXAM: CT HEAD WITHOUT CONTRAST TECHNIQUE: Contiguous axial images were obtained from the base of the skull through the vertex without intravenous contrast. COMPARISON:  Head CT 03/27/2016. FINDINGS: Brain: Stable degree of atrophy and chronic small vessel ischemia. Stable appearance of remote infarct in post right parietal lobe. Mega cisterna magna incidentally noted. No hemorrhage or evidence of acute ischemia. No mass effect or midline shift. Vascular: No hyperdense vessel or unexpected calcification. Skull: Normal. Negative for fracture or focal lesion. Sinuses/Orbits: Unchanged opacification of lower bilateral mastoid air cells. Paranasal sinuses are well-aerated. Post bilateral cataract resection. Other: None. IMPRESSION: Stable degree of atrophy, chronic and remote ischemia. No CT findings of acute intracranial abnormality. Electronically Signed   By: Jeb Levering M.D.   On: 08/09/2016 00:33    EKG: Independently reviewed. Sinus tachycardia with PACs, PVCs.  LVH.  Assessment/Plan Active Problems:   Essential hypertension   Polymyalgia rheumatica ([facility])   History of stroke   Left hemiparesis ([place])  Paroxysmal atrial fibrillation ([facility])   Aspiration pneumonia ([facility])   Acute metabolic encephalopathy      Mild sepsis secondary to pneumonia (aspiration vs HCAP).  Known aspiration risk.  Also has evidence of UTI. --Change cefepime to zosyn to cover for anaerobes --Continue vancomycin --Blood, urine, sputum cultures --Repeat lactic acid level, check procalcitonin --Aspiration precautions --Speech evaluation  History of atrial fibrillation --Cardizem, metoprolol --eliquis  Polymyalgia rheumatic --Prednisone  Of note, she is also on valproic acid.    DVT prophylaxis: Anticoagulated with Eliquis Code Status: DNR Family Communication: Brother, who is also POA, present in the ED at time of admission. Disposition Plan: Expect she will go back to SNF when ready. Consults called: NONE Admission status: Inpatient, stepdown unit.  I expect this patient will need inpatient services for greater than two midnights.   TIME SPENT: 60 minutes   Eber Jones MD [facility] Pager [phone]  If 7PM-7AM, please contact night-coverage [email or web address] Password [email or web address]  08/09/2016, 4:01 AM

## 2016-08-09 NOTE — Progress Notes (Signed)
PROGRESS NOTE    Tammy Beard   LNL:[id number]  DOB: 01-Oct-1940  DOA: 08/08/2016 PCP: [name], MD   Brief Narrative:  Tammy Beard is a 76 y.o. 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 Found to have fever of 100.1, LA 2.96 and bibasilar infiltrates on CXR concerning for pneumonia. UA suggestive of UTI.   Subjective: Sleepy, difficult to awaken.    Assessment & Plan:   Principal Problem:   Severe sepsis - leukocytosis, tachycardia and lactic acidosis with confusion - source may be lungs and or urine - cont current antibiotics and follow clinical course and cultures - obtain swallow eval to assess for level of dysphagia  Active Problems:   Acute metabolic encephalopathy - due to above- follow  Hypokalemia - replace - Mg borderline at 1.7- will replace  Paroxysmal atrial fibrillation (HCC) - HR quite rapid this AM in 120-140s - increased Cardizem infusion to 15 mg/hr after giving a bolus this AM  - will resume Lopressor as IV for now as she apparently has difficulty with swallowing - cont Apixaban    Essential hypertension - follow- on IV Cardizem- also takes Lopressor    Polymyalgia rheumatica  - Prednisone- cont current dose of 5 mg daily    DM (diabetes mellitus), type 2, uncontrolled w/neurologic complication   - should check CBGs but will not be aggressively    History of stroke-  Left hemiparesis   - with left sided weakness    Vascular dementia - cont Depakote which is likely for mood instability  DVT prophylaxis: Apixaban Code Status: DNR Family Communication:  Disposition Plan: follow in SDU Consultants:    Procedures:    Antimicrobials:    Anti-infectives    Start     Dose/Rate Route Frequency Ordered Stop   08/09/16 1600  vancomycin (VANCOCIN) 500 mg in sodium chloride 0.9 % 100 mL IVPB     500 mg 100 mL/hr over 60 Minutes Intravenous Every 12 hours 08/09/16 0140     08/09/16 1200  ceFEPIme (MAXIPIME) 1 g in dextrose 5 % 50 mL IVPB  Status:  Discontinued     1 g 100 mL/hr over 30 Minutes Intravenous Every 12 hours 08/09/16 0140 08/09/16 0401   08/09/16 1000  trimethoprim (TRIMPEX) tablet 100 mg     100 mg Oral Daily 08/09/16 0401     08/09/16 0600  piperacillin-tazobactam (ZOSYN) IVPB 3.375 g     3.375 g 12.5 mL/hr over 240 Minutes Intravenous Every 8 hours 08/09/16 0414     08/09/16 0015  ceFEPIme (MAXIPIME) 2 g in dextrose 5 % 50 mL IVPB     2 g 100 mL/hr over 30 Minutes Intravenous  Once 08/09/16 0001 08/09/16 0138   08/09/16 0015  vancomycin (VANCOCIN) IVPB 1000 mg/200 mL premix     1,000 mg 200 mL/hr over 60 Minutes Intravenous  Once 08/09/16 0001 08/09/16 0305       Objective: Vitals:   08/09/16 0600 08/09/16 0624 08/09/16 0700 08/09/16 0800  BP:   (!) 134/93 129/69  Pulse: 67  (!) 58 (!) 110  Resp: 14  14 12   Temp:  98 F (36.7 C)    TempSrc:  Oral    SpO2: 99%  98% 98%  Weight:  54.4 kg (119 lb 14.9 oz)    Height:        Intake/Output Summary (Last 24 hours) at 08/09/16 1129 Last data filed at 08/09/16 0400  Gross per 24 hour  Intake             1750 ml  Output                0 ml  Net             1750 ml   Filed Weights   08/08/16 2202 08/09/16 0006 08/09/16 0624  Weight: 45.8 kg (101 lb) 54.4 kg (120 lb) 54.4 kg (119 lb 14.9 oz)    Examination: General exam: Appears comfortable  HEENT: PERRLA, oral mucosa moist, no sclera icterus or thrush Respiratory system: Clear to auscultation. Respiratory effort normal. Cardiovascular system: S1 & S2 heard, IIRR.  No murmurs  Gastrointestinal system: Abdomen soft, non-tender, nondistended. Normal bowel sound. No organomegaly Central nervous  system: left hemiparesis Extremities: No cyanosis, clubbing or edema Skin: No rashes or ulcers Psychiatry:  Mood & affect appropriate.     Data Reviewed: I have personally reviewed following labs and imaging studies  CBC:  Recent Labs Lab 08/08/16 2224 08/09/16 0405  WBC 11.0* 11.8*  NEUTROABS 8.9*  --   HGB 11.0* 8.9*  HCT 34.3* 27.5*  MCV 90.0 90.5  PLT 281 242   Basic Metabolic Panel:  Recent Labs Lab 08/08/16 2224 08/09/16 0405 08/09/16 0746  NA 131* 132*  --   K 4.3 2.9*  --   CL 90* 96*  --   CO2 25 23  --   GLUCOSE 170* 188*  --   BUN 18 15  --   CREATININE 0.83 0.74  --   CALCIUM 9.7 8.0*  --   MG  --   --  1.7   GFR: Estimated Creatinine Clearance: 45.1 mL/min (by C-G formula based on SCr of 0.74 mg/dL). Liver Function Tests:  Recent Labs Lab 08/08/16 2224  AST 26  ALT 14  ALKPHOS 159*  BILITOT 0.4  PROT 7.6  ALBUMIN 3.8   No results for input(s): LIPASE, AMYLASE in the last 168 hours. No results for input(s): AMMONIA in the last 168 hours. Coagulation Profile: No results for input(s): INR, PROTIME in the last 168 hours. Cardiac Enzymes: No results for input(s): CKTOTAL, CKMB, CKMBINDEX, TROPONINI in the last 168 hours. BNP (last 3 results) No results for input(s): PROBNP in the last 8760 hours. HbA1C: No results for input(s): HGBA1C in the last 72 hours. CBG: No results for input(s): GLUCAP in the last 168 hours. Lipid Profile: No results for input(s): CHOL, HDL, LDLCALC, TRIG, CHOLHDL, LDLDIRECT in the last 72 hours. Thyroid Function Tests: No results for input(s): TSH, T4TOTAL, FREET4, T3FREE, THYROIDAB in the last 72 hours. Anemia Panel: No results for input(s): VITAMINB12, FOLATE, FERRITIN, TIBC, IRON, RETICCTPCT in the last 72 hours. Urine analysis:    Component Value Date/Time   COLORURINE YELLOW 08/09/2016 0055   APPEARANCEUR TURBID (A) 08/09/2016 0055   LABSPEC 1.014 08/09/2016 0055   PHURINE 5.0 08/09/2016 0055    GLUCOSEU NEGATIVE 08/09/2016 0055   HGBUR SMALL (A) 08/09/2016 0055   HGBUR negative 09/06/2009 1459   BILIRUBINUR NEGATIVE 08/09/2016 0055   BILIRUBINUR NEG 08/23/2010 1440   KETONESUR NEGATIVE 08/09/2016 0055   PROTEINUR 30 (A) 08/09/2016 0055   UROBILINOGEN 0.2 08/23/2010 1440   UROBILINOGEN 0.2 09/06/2009 1459   NITRITE NEGATIVE 08/09/2016 0055  LEUKOCYTESUR LARGE (A) 08/09/2016 0055   Sepsis Labs: @LABRCNTIP (procalcitonin:4,lacticidven:4) ) Recent Results (from the past 240 hour(s))  Blood Culture (routine x 2)     Status: None (Preliminary result)   Collection Time: 08/09/16 12:55 AM  Result Value Ref Range Status   Specimen Description BLOOD LEFT HAND  Final   Special Requests   Final    IN PEDIATRIC BOTTLE Blood Culture adequate volume Performed at [facility] [address], [place], [place] [address]    Culture PENDING  Incomplete   Report Status PENDING  Incomplete  MRSA PCR Screening     Status: None   Collection Time: 08/09/16  3:36 AM  Result Value Ref Range Status   MRSA by PCR NEGATIVE NEGATIVE Final    Comment:        The GeneXpert MRSA Assay (FDA approved for NASAL specimens only), is one component of a comprehensive MRSA colonization surveillance program. It is not intended to diagnose MRSA infection nor to guide or monitor treatment for MRSA infections.          Radiology Studies: Dg Chest 2 View  Result Date: 08/08/2016 CLINICAL DATA:  Initial evaluation for atrial fibrillation. History of hypertension, murmur. EXAM: CHEST  2 VIEW COMPARISON:  Prior radiograph from 07/25/2016. FINDINGS: Stable cardiomegaly. Mediastinal silhouette within normal limits. Aortic atherosclerosis. Chronic lung changes with emphysema and scarring. Small bilateral pleural effusions, greater on the left. Patchy bibasilar opacities, which may reflect atelectasis or infiltrates. No overt pulmonary edema. No pneumothorax. Irregular pleural thickening at the right  lung apex is stable. No acute osseus abnormality. Exaggeration of the normal thoracic kyphosis, similar to previous. Subacute to chronic right-sided rib fractures. Osteopenia. IMPRESSION: 1. Small bilateral pleural effusions, left slightly larger than right. Mild patchy bibasilar opacities may reflect atelectasis or possibly infiltrates. 2. Stable cardiomegaly without pulmonary edema. 3. Aortic atherosclerosis. Electronically Signed   By: Jeannine Boga M.D.   On: 08/08/2016 23:05   Ct Head Wo Contrast  Result Date: 08/09/2016 CLINICAL DATA:  Altered mental status for 1 day. EXAM: CT HEAD WITHOUT CONTRAST TECHNIQUE: Contiguous axial images were obtained from the base of the skull through the vertex without intravenous contrast. COMPARISON:  Head CT 03/27/2016. FINDINGS: Brain: Stable degree of atrophy and chronic small vessel ischemia. Stable appearance of remote infarct in post right parietal lobe. Mega cisterna magna incidentally noted. No hemorrhage or evidence of acute ischemia. No mass effect or midline shift. Vascular: No hyperdense vessel or unexpected calcification. Skull: Normal. Negative for fracture or focal lesion. Sinuses/Orbits: Unchanged opacification of lower bilateral mastoid air cells. Paranasal sinuses are well-aerated. Post bilateral cataract resection. Other: None. IMPRESSION: Stable degree of atrophy, chronic and remote ischemia. No CT findings of acute intracranial abnormality. Electronically Signed   By: Jeb Levering M.D.   On: 08/09/2016 00:33      Scheduled Meds: . apixaban  5 mg Oral BID  . aspirin  81 mg Oral Daily  . divalproex  125 mg Oral Daily  . famotidine  20 mg Oral QHS  . metoprolol tartrate  25 mg Oral BID  . mirabegron ER  25 mg Oral Daily  . predniSONE  5 mg Oral Q breakfast  . senna  1 tablet Oral Daily  . trimethoprim  100 mg Oral Daily   Continuous Infusions: . sodium chloride 100 mL/hr at 08/09/16 0801  . diltiazem (CARDIZEM) infusion 15  mg/hr (08/09/16 1022)  . piperacillin-tazobactam (ZOSYN)  IV Stopped (08/09/16 0802)  . potassium  chloride Stopped (08/09/16 1044)  . vancomycin       LOS: 0 days    Time spent in minutes: [place], MD [facility] Pager: [email or web address] Password [facility] 08/09/2016, 11:29 AM

## 2016-08-09 NOTE — ED Notes (Signed)
Patient is autistic per brother at bedside. Patient usually wear hearing aids. Patient doesn't have hearing aids on making it hard for her to hear. She has a shirt, a pair of glasses, and pair of pants. Patient is not complaining of any pain. Skin color is normal.

## 2016-08-09 NOTE — Progress Notes (Addendum)
Pharmacy Antibiotic Note  Tammy Beard is a 76 y.o. female admitted on 08/08/2016 with pneumonia.  Pharmacy has been consulted for cefepime and vancomycin dosing.  Plan: Cefepime 2 gm x1 then 1 Gm IV q12h Vancomycin 1 Gm x1 then 500 mg IV q12h VT=15-20 mg/L F/u scr/cultures/levels  Height: 5\' 1"  (154.9 cm) Weight: 101 lb (45.8 kg) IBW/kg (Calculated) : 47.8  Temp (24hrs), Avg:98.8 F (37.1 C), Min:98.7 F (37.1 C), Max:98.9 F (37.2 C)   Recent Labs Lab 08/08/16 2224  WBC 11.0*  CREATININE 0.83    Estimated Creatinine Clearance: 41.7 mL/min (by C-G formula based on SCr of 0.83 mg/dL).    No Known Allergies  Antimicrobials this admission: 5/19 cefepime >>  5/19 vancomycin >>   Dose adjustments this admission:   Microbiology results:  BCx:   UCx:    Sputum:    MRSA PCR:   Thank you for allowing pharmacy to be a part of this patient's care.  Dorrene German 08/09/2016 12:06 AM   Addendum:  5/19 0415  Admitting MD changed cefepime to zosyn for aspiration PNA Continue vancomycin as previously ordered   Plan:  ZEI Daily SCr  Thanks Dorrene German 08/09/2016 4:17 AM

## 2016-08-09 NOTE — ED Notes (Signed)
Notified EDP,Ward,MD., pt. I-stat CG4 Lactic acid results 2.96 and RN,Jeneen made aware.

## 2016-08-10 DIAGNOSIS — R1314 Dysphagia, pharyngoesophageal phase: Secondary | ICD-10-CM

## 2016-08-10 DIAGNOSIS — L8990 Pressure ulcer of unspecified site, unspecified stage: Secondary | ICD-10-CM | POA: Insufficient documentation

## 2016-08-10 DIAGNOSIS — E1143 Type 2 diabetes mellitus with diabetic autonomic (poly)neuropathy: Secondary | ICD-10-CM

## 2016-08-10 DIAGNOSIS — E1165 Type 2 diabetes mellitus with hyperglycemia: Secondary | ICD-10-CM

## 2016-08-10 DIAGNOSIS — J189 Pneumonia, unspecified organism: Secondary | ICD-10-CM

## 2016-08-10 LAB — CBC
HCT: 30.5 % — ABNORMAL LOW (ref 36.0–46.0)
Hemoglobin: 9.6 g/dL — ABNORMAL LOW (ref 12.0–15.0)
MCH: 28.3 pg (ref 26.0–34.0)
MCHC: 31.5 g/dL (ref 30.0–36.0)
MCV: 90 fL (ref 78.0–100.0)
PLATELETS: 254 10*3/uL (ref 150–400)
RBC: 3.39 MIL/uL — AB (ref 3.87–5.11)
RDW: 14.3 % (ref 11.5–15.5)
WBC: 5.2 10*3/uL (ref 4.0–10.5)

## 2016-08-10 LAB — BASIC METABOLIC PANEL
Anion gap: 13 (ref 5–15)
BUN: 8 mg/dL (ref 6–20)
CHLORIDE: 88 mmol/L — AB (ref 101–111)
CO2: 29 mmol/L (ref 22–32)
CREATININE: 0.61 mg/dL (ref 0.44–1.00)
Calcium: 8.2 mg/dL — ABNORMAL LOW (ref 8.9–10.3)
GFR calc Af Amer: >60 mL/min (ref >60)
GFR calc non Af Amer: >60 mL/min (ref >60)
GLUCOSE: 138 mg/dL — AB (ref 65–99)
Potassium: 2.4 mmol/L — CL (ref 3.5–5.1)
Sodium: 130 mmol/L — ABNORMAL LOW (ref 135–145)

## 2016-08-10 LAB — GLUCOSE, CAPILLARY
GLUCOSE-CAPILLARY: 205 mg/dL — AB (ref 65–99)
GLUCOSE-CAPILLARY: 124 mg/dL — AB (ref 65–99)
Glucose-Capillary: 164 mg/dL — ABNORMAL HIGH (ref 65–99)

## 2016-08-10 LAB — URINE CULTURE

## 2016-08-10 LAB — STREP PNEUMONIAE URINARY ANTIGEN: Strep Pneumo Urinary Antigen: NEGATIVE

## 2016-08-10 MED ORDER — INSULIN ASPART 100 UNIT/ML ~~LOC~~ SOLN
0.0000 [IU] | Freq: Three times a day (TID) | SUBCUTANEOUS | Status: DC
Start: 2016-08-10 — End: 2016-08-13
  Administered 2016-08-10: 1 [IU] via SUBCUTANEOUS
  Administered 2016-08-10: 3 [IU] via SUBCUTANEOUS
  Administered 2016-08-11 (×2): 1 [IU] via SUBCUTANEOUS
  Administered 2016-08-11 – 2016-08-12 (×2): 2 [IU] via SUBCUTANEOUS
  Administered 2016-08-12: 7 [IU] via SUBCUTANEOUS
  Administered 2016-08-13: 1 [IU] via SUBCUTANEOUS

## 2016-08-10 MED ORDER — DILTIAZEM HCL ER COATED BEADS 120 MG PO CP24
300.0000 mg | ORAL_CAPSULE | Freq: Every day | ORAL | Status: DC
  Administered 2016-08-10 – 2016-08-13 (×4): 300 mg via ORAL
  Filled 2016-08-10 (×4): qty 1

## 2016-08-10 MED ORDER — POTASSIUM CHLORIDE CRYS ER 20 MEQ PO TBCR
40.0000 meq | EXTENDED_RELEASE_TABLET | Freq: Once | ORAL | Status: AC
  Administered 2016-08-10: 40 meq via ORAL
  Filled 2016-08-10: qty 2

## 2016-08-10 MED ORDER — HYDRALAZINE HCL 20 MG/ML IJ SOLN
10.0000 mg | INTRAMUSCULAR | Status: DC | PRN
  Administered 2016-08-11 (×2): 10 mg via INTRAVENOUS
  Filled 2016-08-10 (×2): qty 1

## 2016-08-10 MED ORDER — METOPROLOL TARTRATE 25 MG PO TABS
25.0000 mg | ORAL_TABLET | Freq: Two times a day (BID) | ORAL | Status: DC
  Administered 2016-08-10 – 2016-08-13 (×7): 25 mg via ORAL
  Filled 2016-08-10 (×7): qty 1

## 2016-08-10 MED ORDER — KETOROLAC TROMETHAMINE 15 MG/ML IJ SOLN
15.0000 mg | Freq: Once | INTRAMUSCULAR | Status: AC
  Administered 2016-08-11: 15 mg via INTRAVENOUS
  Filled 2016-08-10 (×2): qty 1

## 2016-08-10 MED ORDER — MAGNESIUM SULFATE 2 GM/50ML IV SOLN
2.0000 g | Freq: Once | INTRAVENOUS | Status: AC
  Administered 2016-08-10: 2 g via INTRAVENOUS
  Filled 2016-08-10: qty 50

## 2016-08-10 MED ORDER — POTASSIUM CHLORIDE 10 MEQ/100ML IV SOLN
10.0000 meq | INTRAVENOUS | Status: AC
  Administered 2016-08-10 (×6): 10 meq via INTRAVENOUS
  Filled 2016-08-10 (×6): qty 100

## 2016-08-10 MED ORDER — INSULIN ASPART 100 UNIT/ML ~~LOC~~ SOLN
0.0000 [IU] | Freq: Every day | SUBCUTANEOUS | Status: DC
Start: 2016-08-10 — End: 2016-08-13

## 2016-08-10 NOTE — Progress Notes (Signed)
PROGRESS NOTE    Tammy Beard   GGY:[id number]  DOB: [birth year]  DOA: 08/08/2016 PCP: [name], MD   Brief Narrative:  Tammy Beard is a 76 y.o. 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 Found to have fever of 100.1, LA 2.96 and bibasilar infiltrates on CXR concerning for pneumonia. UA suggestive of UTI.   Subjective: No complaints today.  Assessment & Plan:   Principal Problem:   Severe sepsis - leukocytosis, tachycardia and lactic acidosis with confusion - source may be lungs and or urine- d/c Vancomycin- cont zosyn - obtain swallow eval to assess for level of dysphagia- she is a known high risk for aspiration and she and her family are aware of the risks - UA has grown Lactobacillus- do not necessarily need to treat this in immune competent individuals, however treatment for pneumonia will treat Lactobacillus as well  Active Problems:   Acute metabolic encephalopathy - due to above- has resolved  Hypokalemia - replacing -    Paroxysmal atrial fibrillation (HCC) - HR 120-140s- improved with Cardizem infusion at 15 mg/hr and Lopresor - transition to oral Cardizem today (will need a higher dose than home dose) and oral Lopressor today - cont Apixaban    Essential hypertension - follow-     Polymyalgia rheumatica  - Prednisone- cont current dose of 5 mg daily    DM (diabetes mellitus), type 2, uncontrolled w/neurologic complication   - low dose SSI    History of stroke-  Left hemiparesis   - with left sided weakness    Vascular dementia - cont Depakote which is likely for mood instability  DVT prophylaxis: Apixaban Code Status: DNR Family Communication:    Disposition Plan: follow in SDU Consultants:    Procedures:    Antimicrobials:  Anti-infectives    Start     Dose/Rate Route Frequency Ordered Stop   08/09/16 1600  vancomycin (VANCOCIN) 500 mg in sodium chloride 0.9 % 100 mL IVPB  Status:  Discontinued     500 mg 100 mL/hr over 60 Minutes Intravenous Every 12 hours 08/09/16 0140 08/10/16 0846   08/09/16 1200  ceFEPIme (MAXIPIME) 1 g in dextrose 5 % 50 mL IVPB  Status:  Discontinued     1 g 100 mL/hr over 30 Minutes Intravenous Every 12 hours 08/09/16 0140 08/09/16 0401   08/09/16 1000  trimethoprim (TRIMPEX) tablet 100 mg  Status:  Discontinued     100 mg Oral Daily 08/09/16 0401 08/09/16 1136   08/09/16 0600  piperacillin-tazobactam (ZOSYN) IVPB 3.375 g     3.375 g 12.5 mL/hr over 240 Minutes Intravenous Every 8 hours 08/09/16 0414     08/09/16 0015  ceFEPIme (MAXIPIME) 2 g in dextrose 5 % 50 mL IVPB     2 g 100 mL/hr over 30 Minutes Intravenous  Once 08/09/16 0001 08/09/16 0138   08/09/16 0015  vancomycin (VANCOCIN) IVPB 1000 mg/200 mL premix     1,000 mg 200 mL/hr over 60 Minutes Intravenous  Once 08/09/16 0001 08/09/16 0305       Objective: Vitals:   08/10/16 0500 08/10/16 0505 08/10/16 0600 08/10/16 0700  BP: 121/82  (!) 169/61 (!) 168/113  Pulse: (!) 103  99 (!) 58  Resp: 17  16  20  Temp:  97.3 F (36.3 C)    TempSrc:  Oral    SpO2: 96%  98% 99%  Weight:      Height:        Intake/Output Summary (Last 24 hours) at 08/10/16 1145 Last data filed at 08/09/16 1500  Gross per 24 hour  Intake              680 ml  Output                0 ml  Net              680 ml   Filed Weights   08/08/16 2202 08/09/16 0006 08/09/16 0624  Weight: 45.8 kg (101 lb) 54.4 kg (120 lb) 54.4 kg (119 lb 14.9 oz)    Examination: General exam: Appears comfortable  HEENT: PERRLA, oral mucosa moist, no sclera icterus or thrush Respiratory system: Clear to auscultation. Respiratory effort normal. Cardiovascular system: S1 & S2  heard, IIRR.  No murmurs  Gastrointestinal system: Abdomen soft, non-tender, nondistended. Normal bowel sound. No organomegaly Central nervous system: left hemiparesis Extremities: No cyanosis, clubbing or edema Skin: No rashes or ulcers Psychiatry:  Mood & affect appropriate.     Data Reviewed: I have personally reviewed following labs and imaging studies  CBC:  Recent Labs Lab 08/08/16 2224 08/09/16 0405 08/10/16 0638  WBC 11.0* 11.8* 5.2  NEUTROABS 8.9*  --   --   HGB 11.0* 8.9* 9.6*  HCT 34.3* 27.5* 30.5*  MCV 90.0 90.5 90.0  PLT 281 242 160   Basic Metabolic Panel:  Recent Labs Lab 08/08/16 2224 08/09/16 0405 08/09/16 0746 08/10/16 0638  NA 131* 132*  --  130*  K 4.3 2.9*  --  2.4*  CL 90* 96*  --  88*  CO2 25 23  --  29  GLUCOSE 170* 188*  --  138*  BUN 18 15  --  8  CREATININE 0.83 0.74  --  0.61  CALCIUM 9.7 8.0*  --  8.2*  MG  --   --  1.7  --    GFR: Estimated Creatinine Clearance: 45.1 mL/min (by C-G formula based on SCr of 0.61 mg/dL). Liver Function Tests:  Recent Labs Lab 08/08/16 2224  AST 26  ALT 14  ALKPHOS 159*  BILITOT 0.4  PROT 7.6  ALBUMIN 3.8   No results for input(s): LIPASE, AMYLASE in the last 168 hours. No results for input(s): AMMONIA in the last 168 hours. Coagulation Profile: No results for input(s): INR, PROTIME in the last 168 hours. Cardiac Enzymes: No results for input(s): CKTOTAL, CKMB, CKMBINDEX, TROPONINI in the last 168 hours. BNP (last 3 results) No results for input(s): PROBNP in the last 8760 hours. HbA1C: No results for input(s): HGBA1C in the last 72 hours. CBG: No results for input(s): GLUCAP in the last 168 hours. Lipid Profile: No results for input(s): CHOL, HDL, LDLCALC, TRIG, CHOLHDL, LDLDIRECT in the last 72 hours. Thyroid Function Tests: No results for input(s): TSH, T4TOTAL, FREET4, T3FREE, THYROIDAB in the last 72 hours. Anemia Panel: No results for input(s): VITAMINB12, FOLATE, FERRITIN,  TIBC, IRON, RETICCTPCT in the last 72 hours. Urine analysis:    Component Value Date/Time   COLORURINE YELLOW 08/09/2016 0055   APPEARANCEUR TURBID (A) 08/09/2016 0055   LABSPEC 1.014 08/09/2016 0055   PHURINE 5.0 08/09/2016 0055   GLUCOSEU NEGATIVE 08/09/2016 0055   HGBUR SMALL (A) 08/09/2016 0055   HGBUR negative 09/06/2009 1459  BILIRUBINUR NEGATIVE 08/09/2016 0055   BILIRUBINUR NEG 08/23/2010 1440   KETONESUR NEGATIVE 08/09/2016 0055   PROTEINUR 30 (A) 08/09/2016 0055   UROBILINOGEN 0.2 08/23/2010 1440   UROBILINOGEN 0.2 09/06/2009 1459   NITRITE NEGATIVE 08/09/2016 0055   LEUKOCYTESUR LARGE (A) 08/09/2016 0055   Sepsis Labs: @LABRCNTIP (procalcitonin:4,lacticidven:4) ) Recent Results (from the past 240 hour(s))  Blood Culture (routine x 2)     Status: None (Preliminary result)   Collection Time: 08/09/16 12:55 AM  Result Value Ref Range Status   Specimen Description BLOOD LEFT HAND  Final   Special Requests   Final    IN PEDIATRIC BOTTLE Blood Culture adequate volume Performed at [facility] [address] [place], [place] [address]    Culture PENDING  Incomplete   Report Status PENDING  Incomplete  Urine culture     Status: Abnormal   Collection Time: 08/09/16 12:55 AM  Result Value Ref Range Status   Specimen Description URINE, RANDOM  Final   Special Requests NONE  Final   Culture (A)  Final    >=100,000 COLONIES/mL LACTOBACILLUS SPECIES Standardized susceptibility testing for this organism is not available. 70,000 COLONIES/mL YEAST    Report Status 08/10/2016 FINAL  Final  MRSA PCR Screening     Status: None   Collection Time: 08/09/16  3:36 AM  Result Value Ref Range Status   MRSA by PCR NEGATIVE NEGATIVE Final    Comment:        The GeneXpert MRSA Assay (FDA approved for NASAL specimens only), is one component of a comprehensive MRSA colonization surveillance program. It is not intended to diagnose MRSA infection nor to guide or monitor  treatment for MRSA infections.          Radiology Studies: Dg Chest 2 View  Result Date: 08/08/2016 CLINICAL DATA:  Initial evaluation for atrial fibrillation. History of hypertension, murmur. EXAM: CHEST  2 VIEW COMPARISON:  Prior radiograph from 07/25/2016. FINDINGS: Stable cardiomegaly. Mediastinal silhouette within normal limits. Aortic atherosclerosis. Chronic lung changes with emphysema and scarring. Small bilateral pleural effusions, greater on the left. Patchy bibasilar opacities, which may reflect atelectasis or infiltrates. No overt pulmonary edema. No pneumothorax. Irregular pleural thickening at the right lung apex is stable. No acute osseus abnormality. Exaggeration of the normal thoracic kyphosis, similar to previous. Subacute to chronic right-sided rib fractures. Osteopenia. IMPRESSION: 1. Small bilateral pleural effusions, left slightly larger than right. Mild patchy bibasilar opacities may reflect atelectasis or possibly infiltrates. 2. Stable cardiomegaly without pulmonary edema. 3. Aortic atherosclerosis. Electronically Signed   By: Jeannine Boga M.D.   On: 08/08/2016 23:05   Ct Head Wo Contrast  Result Date: 08/09/2016 CLINICAL DATA:  Altered mental status for 1 day. EXAM: CT HEAD WITHOUT CONTRAST TECHNIQUE: Contiguous axial images were obtained from the base of the skull through the vertex without intravenous contrast. COMPARISON:  Head CT 03/27/2016. FINDINGS: Brain: Stable degree of atrophy and chronic small vessel ischemia. Stable appearance of remote infarct in post right parietal lobe. Mega cisterna magna incidentally noted. No hemorrhage or evidence of acute ischemia. No mass effect or midline shift. Vascular: No hyperdense vessel or unexpected calcification. Skull: Normal. Negative for fracture or focal lesion. Sinuses/Orbits: Unchanged opacification of lower bilateral mastoid air cells. Paranasal sinuses are well-aerated. Post bilateral cataract resection. Other:  None. IMPRESSION: Stable degree of atrophy, chronic and remote ischemia. No CT findings of acute intracranial abnormality. Electronically Signed   By: Jeb Levering M.D.   On: 08/09/2016 00:33  Scheduled Meds: . apixaban  5 mg Oral BID  . aspirin  81 mg Oral Daily  . diltiazem  300 mg Oral Daily  . divalproex  125 mg Oral Daily  . famotidine  20 mg Oral QHS  . ketorolac  15 mg Intravenous Once  . metoprolol tartrate  25 mg Oral BID  . mirabegron ER  25 mg Oral Daily  . predniSONE  5 mg Oral Q breakfast  . senna  1 tablet Oral Daily   Continuous Infusions: . sodium chloride 100 mL/hr at 08/10/16 0821  . magnesium sulfate 1 - 4 g bolus IVPB    . piperacillin-tazobactam (ZOSYN)  IV Stopped (08/10/16 1012)  . potassium chloride 10 mEq (08/10/16 1054)     LOS: 1 day    Time spent in minutes: 35    Debbe Odea, MD [facility] Pager: [email or web address] Password [email or web address] 08/10/2016, 11:45 AM

## 2016-08-10 NOTE — Progress Notes (Signed)
CRITICAL VALUE ALERT  Critical value received:  K- 2.4  Date of notification:  08/10/2016  Time of notification:  0755  Critical value read back:Yes.    Nurse who received alert:  Jeannie Fend RN  MD notified (1st page):  Dr. Wynelle Cleveland  Text Page  Time of first page:  0800  MD notified (2nd page):  Time of second page:  Responding MD: Dr. Wynelle Cleveland  Time MD responded:  0800

## 2016-08-11 DIAGNOSIS — R4182 Altered mental status, unspecified: Secondary | ICD-10-CM

## 2016-08-11 LAB — POTASSIUM: POTASSIUM: 3.9 mmol/L (ref 3.5–5.1)

## 2016-08-11 LAB — BASIC METABOLIC PANEL
ANION GAP: 13 (ref 5–15)
BUN: 10 mg/dL (ref 6–20)
CALCIUM: 8.7 mg/dL — AB (ref 8.9–10.3)
CO2: 28 mmol/L (ref 22–32)
Chloride: 89 mmol/L — ABNORMAL LOW (ref 101–111)
Creatinine, Ser: 0.63 mg/dL (ref 0.44–1.00)
GFR calc Af Amer: >60 mL/min (ref >60)
GFR calc non Af Amer: >60 mL/min (ref >60)
GLUCOSE: 135 mg/dL — AB (ref 65–99)
POTASSIUM: 2.8 mmol/L — AB (ref 3.5–5.1)
SODIUM: 130 mmol/L — AB (ref 135–145)

## 2016-08-11 LAB — GLUCOSE, CAPILLARY
GLUCOSE-CAPILLARY: 125 mg/dL — AB (ref 65–99)
Glucose-Capillary: 123 mg/dL — ABNORMAL HIGH (ref 65–99)
Glucose-Capillary: 154 mg/dL — ABNORMAL HIGH (ref 65–99)
Glucose-Capillary: 109 mg/dL — ABNORMAL HIGH (ref 65–99)

## 2016-08-11 LAB — LEGIONELLA PNEUMOPHILA SEROGP 1 UR AG: L. PNEUMOPHILA SEROGP 1 UR AG: NEGATIVE

## 2016-08-11 LAB — MAGNESIUM: MAGNESIUM: 2.2 mg/dL (ref 1.7–2.4)

## 2016-08-11 MED ORDER — POTASSIUM CHLORIDE CRYS ER 20 MEQ PO TBCR: 40.0000 meq | EXTENDED_RELEASE_TABLET | ORAL | Status: DC

## 2016-08-11 MED ORDER — POTASSIUM CHLORIDE 10 MEQ/100ML IV SOLN
10.0000 meq | INTRAVENOUS | Status: AC
  Administered 2016-08-11 (×4): 10 meq via INTRAVENOUS
  Filled 2016-08-11 (×4): qty 100

## 2016-08-11 MED ORDER — METOPROLOL TARTRATE 5 MG/5ML IV SOLN
5.0000 mg | Freq: Four times a day (QID) | INTRAVENOUS | Status: DC | PRN
  Administered 2016-08-11 – 2016-08-13 (×6): 5 mg via INTRAVENOUS
  Filled 2016-08-11 (×8): qty 5

## 2016-08-11 MED ORDER — POTASSIUM CHLORIDE CRYS ER 20 MEQ PO TBCR
40.0000 meq | EXTENDED_RELEASE_TABLET | ORAL | Status: AC
  Administered 2016-08-11 (×2): 40 meq via ORAL
  Filled 2016-08-11 (×2): qty 2

## 2016-08-11 NOTE — Progress Notes (Signed)
PROGRESS NOTE    Tammy Beard   FUX:[id number]  DOB: 09-25-1940  DOA: 08/08/2016 PCP: [name], MD   Brief Narrative:  Tammy Beard is a 76 y.o. 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 Found to have fever of 100.1, LA 2.96 and bibasilar infiltrates on CXR concerning for pneumonia. UA suggestive of UTI.   Subjective: No complaints today.  Assessment & Plan:   Principal Problem:   Severe sepsis - leukocytosis, tachycardia and lactic acidosis with confusion - source may be lungs and or urine- d/c Vancomycin- cont zosyn - obtain swallow eval to assess for level of dysphagia- she is a known high risk for aspiration and she and her family are aware of the risks - UA has grown Lactobacillus- do not necessarily need to treat this in immune competent individuals, however treatment for pneumonia will treat Lactobacillus as well  Active Problems:   Acute metabolic encephalopathy - due to above- has resolved  Hypokalemia - replacing aggressively daily- continue to repalce -    Paroxysmal atrial fibrillation (HCC) - HR 120-140s- improved with Cardizem infusion at 15 mg/hr and Lopresor - transition to oral Cardizem at a higher dose than home dose and home dose of oral Lopressor - HR in 120 now- increase Lopressor  And follow- may need to use Dig if BP begins to drop - cont Apixaban    Essential hypertension - follow-     Polymyalgia rheumatica  - Prednisone- cont current dose of 5 mg daily    DM (diabetes mellitus), type 2, uncontrolled w/neurologic complication   - low dose SSI    History of stroke-  Left hemiparesis   - with left sided weakness    Vascular dementia -  cont Depakote which is likely for mood instability  DVT prophylaxis: Apixaban Code Status: DNR Family Communication: brother Disposition Plan: follow on telemetry Consultants:    Procedures:    Antimicrobials:  Anti-infectives    Start     Dose/Rate Route Frequency Ordered Stop   08/09/16 1600  vancomycin (VANCOCIN) 500 mg in sodium chloride 0.9 % 100 mL IVPB  Status:  Discontinued     500 mg 100 mL/hr over 60 Minutes Intravenous Every 12 hours 08/09/16 0140 08/10/16 0846   08/09/16 1200  ceFEPIme (MAXIPIME) 1 g in dextrose 5 % 50 mL IVPB  Status:  Discontinued     1 g 100 mL/hr over 30 Minutes Intravenous Every 12 hours 08/09/16 0140 08/09/16 0401   08/09/16 1000  trimethoprim (TRIMPEX) tablet 100 mg  Status:  Discontinued     100 mg Oral Daily 08/09/16 0401 08/09/16 1136   08/09/16 0600  piperacillin-tazobactam (ZOSYN) IVPB 3.375 g     3.375 g 12.5 mL/hr over 240 Minutes Intravenous Every 8 hours 08/09/16 0414     08/09/16 0015  ceFEPIme (MAXIPIME) 2 g in dextrose 5 % 50 mL IVPB     2 g 100 mL/hr over 30 Minutes Intravenous  Once 08/09/16 0001 08/09/16 0138   08/09/16 0015  vancomycin (VANCOCIN) IVPB 1000 mg/200 mL premix     1,000 mg 200 mL/hr over 60 Minutes Intravenous  Once 08/09/16 0001 08/09/16 0305       Objective: Vitals:   08/11/16 0853 08/11/16 0900 08/11/16  1000 08/11/16 1100  BP:  112/63 (!) 143/62 (!) 155/73  Pulse: 75 (!) 114 (!) 105 (!) 109  Resp: 16 (!) 21 (!) 31 20  Temp:      TempSrc:      SpO2: 100% 99% 99% 99%  Weight:      Height:        Intake/Output Summary (Last 24 hours) at 08/11/16 1202 Last data filed at 08/11/16 1121  Gross per 24 hour  Intake             2580 ml  Output                0 ml  Net             2580 ml   Filed Weights   08/08/16 2202 08/09/16 0006 08/09/16 0624  Weight: 45.8 kg (101 lb) 54.4 kg (120 lb) 54.4 kg (119 lb 14.9 oz)    Examination: General exam: Appears comfortable  HEENT: PERRLA, oral mucosa moist,  no sclera icterus or thrush Respiratory system: Clear to auscultation. Respiratory effort normal. Cardiovascular system: S1 & S2 heard, IIRR.  No murmurs  Gastrointestinal system: Abdomen soft, non-tender, nondistended. Normal bowel sound. No organomegaly Central nervous system: left hemiparesis Extremities: No cyanosis, clubbing or edema Skin: No rashes or ulcers Psychiatry:  Mood & affect appropriate.     Data Reviewed: I have personally reviewed following labs and imaging studies  CBC:  Recent Labs Lab 08/08/16 2224 08/09/16 0405 08/10/16 0638  WBC 11.0* 11.8* 5.2  NEUTROABS 8.9*  --   --   HGB 11.0* 8.9* 9.6*  HCT 34.3* 27.5* 30.5*  MCV 90.0 90.5 90.0  PLT 281 242 102   Basic Metabolic Panel:  Recent Labs Lab 08/08/16 2224 08/09/16 0405 08/09/16 0746 08/10/16 0638 08/11/16 0820  NA 131* 132*  --  130* 130*  K 4.3 2.9*  --  2.4* 2.8*  CL 90* 96*  --  88* 89*  CO2 25 23  --  29 28  GLUCOSE 170* 188*  --  138* 135*  BUN 18 15  --  8 10  CREATININE 0.83 0.74  --  0.61 0.63  CALCIUM 9.7 8.0*  --  8.2* 8.7*  MG  --   --  1.7  --  2.2   GFR: Estimated Creatinine Clearance: 45.1 mL/min (by C-G formula based on SCr of 0.63 mg/dL). Liver Function Tests:  Recent Labs Lab 08/08/16 2224  AST 26  ALT 14  ALKPHOS 159*  BILITOT 0.4  PROT 7.6  ALBUMIN 3.8   No results for input(s): LIPASE, AMYLASE in the last 168 hours. No results for input(s): AMMONIA in the last 168 hours. Coagulation Profile: No results for input(s): INR, PROTIME in the last 168 hours. Cardiac Enzymes: No results for input(s): CKTOTAL, CKMB, CKMBINDEX, TROPONINI in the last 168 hours. BNP (last 3 results) No results for input(s): PROBNP in the last 8760 hours. HbA1C: No results for input(s): HGBA1C in the last 72 hours. CBG:  Recent Labs Lab 08/10/16 1209 08/10/16 1700 08/10/16 2146 08/11/16 0741  GLUCAP 205* 124* 164* 123*   Lipid Profile: No results for input(s): CHOL, HDL,  LDLCALC, TRIG, CHOLHDL, LDLDIRECT in the last 72 hours. Thyroid Function Tests: No results for input(s): TSH, T4TOTAL, FREET4, T3FREE, THYROIDAB in the last 72 hours. Anemia Panel: No results for input(s): VITAMINB12, FOLATE, FERRITIN, TIBC, IRON, RETICCTPCT in the last 72 hours. Urine analysis:    Component Value Date/Time   COLORURINE  YELLOW 08/09/2016 0055   APPEARANCEUR TURBID (A) 08/09/2016 0055   LABSPEC 1.014 08/09/2016 0055   PHURINE 5.0 08/09/2016 0055   GLUCOSEU NEGATIVE 08/09/2016 0055   HGBUR SMALL (A) 08/09/2016 0055   HGBUR negative 09/06/2009 1459   BILIRUBINUR NEGATIVE 08/09/2016 0055   BILIRUBINUR NEG 08/23/2010 1440   KETONESUR NEGATIVE 08/09/2016 0055   PROTEINUR 30 (A) 08/09/2016 0055   UROBILINOGEN 0.2 08/23/2010 1440   UROBILINOGEN 0.2 09/06/2009 1459   NITRITE NEGATIVE 08/09/2016 0055   LEUKOCYTESUR LARGE (A) 08/09/2016 0055   Sepsis Labs: @LABRCNTIP (procalcitonin:4,lacticidven:4) ) Recent Results (from the past 240 hour(s))  Blood Culture (routine x 2)     Status: None (Preliminary result)   Collection Time: 08/09/16 12:55 AM  Result Value Ref Range Status   Specimen Description BLOOD RIGHT HAND  Final   Special Requests BOTTLES DRAWN AEROBIC AND ANAEROBIC BCAV  Final   Culture   Final    NO GROWTH 2 DAYS Performed at [facility], [place] [address], [place], [place] [address]    Report Status PENDING  Incomplete  Blood Culture (routine x 2)     Status: None (Preliminary result)   Collection Time: 08/09/16 12:55 AM  Result Value Ref Range Status   Specimen Description BLOOD LEFT HAND  Final   Special Requests IN PEDIATRIC BOTTLE Blood Culture adequate volume  Final   Culture   Final    NO GROWTH 2 DAYS Performed at [facility], [place] [address] [place], [place] [address]    Report Status PENDING  Incomplete  Urine culture     Status: Abnormal   Collection Time: 08/09/16 12:55 AM  Result Value Ref Range Status   Specimen Description  URINE, RANDOM  Final   Special Requests NONE  Final   Culture (A)  Final    >=100,000 COLONIES/mL LACTOBACILLUS SPECIES Standardized susceptibility testing for this organism is not available. 70,000 COLONIES/mL YEAST    Report Status 08/10/2016 FINAL  Final  MRSA PCR Screening     Status: None   Collection Time: 08/09/16  3:36 AM  Result Value Ref Range Status   MRSA by PCR NEGATIVE NEGATIVE Final    Comment:        The GeneXpert MRSA Assay (FDA approved for NASAL specimens only), is one component of a comprehensive MRSA colonization surveillance program. It is not intended to diagnose MRSA infection nor to guide or monitor treatment for MRSA infections.          Radiology Studies: No results found.    Scheduled Meds: . apixaban  5 mg Oral BID  . aspirin  81 mg Oral Daily  . diltiazem  300 mg Oral Daily  . divalproex  125 mg Oral Daily  . famotidine  20 mg Oral QHS  . insulin aspart  0-5 Units Subcutaneous QHS  . insulin aspart  0-9 Units Subcutaneous TID WC  . metoprolol tartrate  25 mg Oral BID  . mirabegron ER  25 mg Oral Daily  . potassium chloride  40 mEq Oral Q4H  . predniSONE  5 mg Oral Q breakfast  . senna  1 tablet Oral Daily   Continuous Infusions: . sodium chloride 100 mL/hr at 08/11/16 0853  . piperacillin-tazobactam (ZOSYN)  IV Stopped (08/11/16 0917)  . potassium chloride 10 mEq (08/11/16 1121)     LOS: 2 days    Time spent in minutes: 35    Debbe Odea, MD [facility] Pager: [email or web address] Password [email or web address] 08/11/2016, 12:02  PM  

## 2016-08-11 NOTE — Clinical Social Work Note (Signed)
Clinical Social Work Assessment  Patient Details  Name: Tammy Beard MRN: [id number] Date of Birth: 09/18/1940  Date of referral:  08/04/16               Reason for consult:  Facility Placement, Discharge Planning                Permission sought to share information with:  Facility Art therapist granted to share information::  Yes, Verbal Permission Granted  Name::        Agency::     Relationship::     Contact Information:     Housing/Transportation Living arrangements for the past 2 months:  [place] of Information:  Other (Comment Required) (siblings) Patient Interpreter Needed:  None Criminal Activity/Legal Involvement Pertinent to Current Situation/Hospitalization:  No - Comment as needed Significant Relationships:  Siblings Lives with:  Facility Resident Do you feel safe going back to the place where you live?  Yes Need for family participation in patient care:  Yes (Comment)  Care giving concerns:  No complaints reported at this time.   Social Worker assessment / plan:  Pt hospitalized from [facility] ( SNF ) on 08/08/16 with Mild sepsis secondary to pneumonia (aspiration vs HCAP).  CSW met with pt / family at bedside to assist with d/c planning. Pt plans to return to [facility] at d/c. Clinicals have been sent to SNF for review. [facility] confirmed that pt is a LTC resident and can return to SNF at d/c.   Employment status:  Retired Forensic scientist:  Medicare PT Recommendations:  Not assessed at this time Information / Referral to community resources:     Patient/Family's Response to care:  Pt / family are in agreement with pt returning to SNF at d/c.  Patient/Family's Understanding of and Emotional Response to Diagnosis, Current Treatment, and Prognosis:  Family spoke with MD 5/20 for medical update. Pt / family are looking forward to pt feeling better and returning to [facility]. Family  appreciates CSW assistance with d/c planning.  Emotional Assessment Appearance:  Appears stated age Attitude/Demeanor/Rapport:  Other (cooperative) Affect (typically observed):  Anxious Orientation:  Oriented to Self, Oriented to Place Alcohol / Substance use:  Not Applicable Psych involvement (Current and /or in the community):  No (Comment)  Discharge Needs  Concerns to be addressed:  Discharge Planning Concerns Readmission within the last 30 days:  No Current discharge risk:  None Barriers to Discharge:  No Barriers Identified   Luretha Rued, [place] 08/11/2016, 1:22 PM

## 2016-08-11 NOTE — NC FL2 (Signed)
[place] LEVEL OF CARE SCREENING TOOL     IDENTIFICATION  Patient Name: Tammy Beard Birthdate: [birth year] Sex: female Admission Date (Current Location): 08/08/2016  [facility] and [place] Number:  Herbalist and Address:  [facility],  [place] [address], [place]      Provider Number: [id number] [id number]  Attending Physician Name and Address:  Debbe Odea, MD  Relative Name and Phone Number:       Current Level of Care: Hospital Recommended Level of Care: [place] Prior Approval Number:    Date Approved/Denied:   PASRR Number:    Discharge Plan: SNF    Current Diagnoses: Patient Active Problem List   Diagnosis Date Noted  . Pressure injury of skin 08/10/2016  . Aspiration pneumonia ([place]) 08/09/2016  . Acute metabolic encephalopathy 33/29/5188  . Severe sepsis ([place]) 08/09/2016  . Paronychia of great toe, left 07/29/2016  . History of rib fracture 07/25/2016  . Vascular dementia 07/18/2016  . Weight loss 07/14/2016  . Pressure ulcer of buttock 06/17/2016  . Constipation 06/17/2016  . Chronic anticoagulation 06/10/2016  . Syncope 04/01/2016  . Psychosis 04/01/2016  . Dysphagia, pharyngoesophageal phase 04/01/2016  . Paroxysmal atrial fibrillation (HCC)   . Acute ischemic right MCA stroke ([place]) 03/24/2016  . Cerebrovascular accident (CVA) due to embolism of cerebral artery ([place]) 03/24/2016  . Left hemiparesis ([place]) 03/23/2016  . Hypokalemia 03/23/2016  . Anemia 03/20/2016  . Traumatic closed nondisplaced fracture of distal end of right radius 03/10/2016  . UTI (urinary tract infection) 02/22/2016  . Intracranial vascular stenosis 02/07/2016  . CVA, old, disturbances of vision 12/20/2015  . Abnormality of gait 11/27/2015  . Citrobacter infection   . GERD (gastroesophageal reflux disease) 11/22/2015  . Malaise 11/22/2015  . Hyponatremia 11/22/2015  . Ecchymosis 11/15/2015  . Palpitations 11/02/2015  .  Atrial tachycardia ([place]) 09/25/2015  . History of stroke   . Disequilibrium   . Dizzy 09/18/2015  . 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 Diastasis recti 01/23/2015  . DM (diabetes mellitus), type 2, uncontrolled w/neurologic complication ([place]) 41/66/0630  . Heart murmur 03/07/2014  . Polymyalgia rheumatica ([place]) 12/08/2013  . Disturbance in sleep behavior 06/28/2009  . Urinary frequency 06/28/2009  . Dyslipidemia 11/09/2008  . Essential hypertension 06/18/2007  . ALLERGIC RHINITIS 06/18/2007  . Ulcerative colitis ([place]) 06/08/2007  . OSTEOPENIA 06/08/2007  . MURMUR 06/08/2007    Orientation RESPIRATION BLADDER Height & Weight     Self, Place  O2 Incontinent Weight: 119 lb 14.9 oz (54.4 kg) Height:  5\' 1"  (154.9 cm)  BEHAVIORAL SYMPTOMS/MOOD NEUROLOGICAL BOWEL NUTRITION STATUS  Other (Comment) (No Behaviors)   Incontinent Diet (DYS 3)  AMBULATORY STATUS COMMUNICATION OF NEEDS Skin   Extensive Assist Verbally Other (Comment) (Stage 2 pressure ulcer on sacrum.)                       Personal Care Assistance Level of Assistance  Bathing, Feeding, Dressing Bathing Assistance: Maximum assistance Feeding assistance: Limited assistance Dressing Assistance: Maximum assistance     Functional Limitations Info  Sight, Hearing, Speech Sight Info: Adequate Hearing Info: Impaired Speech Info: Adequate    SPECIAL CARE FACTORS FREQUENCY                       Contractures Contractures Info: Not present    Additional Factors Info  Code Status Code Status Info: DNR  Current Medications (08/11/2016):  This is the current hospital active medication list Current Facility-Administered Medications  Medication Dose Route Frequency Provider Last Rate Last Dose  . 0.9 %  sodium chloride infusion   Intravenous Continuous Debbe Odea, MD 100 mL/hr at 08/11/16 0853    . apixaban (ELIQUIS) tablet 5 mg  5 mg Oral BID Lily Kocher, MD   5 mg at 08/11/16 1055  .  aspirin chewable tablet 81 mg  81 mg Oral Daily Lily Kocher, MD   81 mg at 08/11/16 1055  . diltiazem (CARDIZEM CD) 24 hr capsule 300 mg  300 mg Oral Daily Debbe Odea, MD   300 mg at 08/11/16 0757  . divalproex (DEPAKOTE SPRINKLE) capsule 125 mg  125 mg Oral Daily Lily Kocher, MD   125 mg at 08/11/16 1055  . famotidine (PEPCID) tablet 20 mg  20 mg Oral QHS Lily Kocher, MD   20 mg at 08/10/16 2151  . insulin aspart (novoLOG) injection 0-5 Units  0-5 Units Subcutaneous QHS Rizwan, Saima, MD      . insulin aspart (novoLOG) injection 0-9 Units  0-9 Units Subcutaneous TID WC Debbe Odea, MD   2 Units at 08/11/16 1223  . metoprolol tartrate (LOPRESSOR) injection 5 mg  5 mg Intravenous Q6H PRN Debbe Odea, MD   5 mg at 08/11/16 1222  . metoprolol tartrate (LOPRESSOR) tablet 25 mg  25 mg Oral BID Debbe Odea, MD   25 mg at 08/11/16 0811  . mirabegron ER (MYRBETRIQ) tablet 25 mg  25 mg Oral Daily Lily Kocher, MD   25 mg at 08/11/16 1056  . piperacillin-tazobactam (ZOSYN) IVPB 3.375 g  3.375 g Intravenous Q8H Dorrene German, [place]   Stopped at 08/11/16 1856  . potassium chloride 10 mEq in 100 mL IVPB  10 mEq Intravenous Q1 Hr x 4 Rizwan, Saima, MD 100 mL/hr at 08/11/16 1121 10 mEq at 08/11/16 1121  . potassium chloride SA (K-DUR,KLOR-CON) CR tablet 40 mEq  40 mEq Oral Q4H Debbe Odea, MD   40 mEq at 08/11/16 1121  . predniSONE (DELTASONE) tablet 5 mg  5 mg Oral Q breakfast Lily Kocher, MD   5 mg at 08/11/16 0757  . senna (SENOKOT) tablet 8.6 mg  1 tablet Oral Daily Lily Kocher, MD   8.6 mg at 08/11/16 1055     Discharge Medications: Please see discharge summary for a list of discharge medications.  Relevant Imaging Results:  Relevant Lab Results:   Additional Information [id number].  Warner [place], Randall An, LCSW

## 2016-08-11 NOTE — Progress Notes (Signed)
SLP Cancellation Note  Patient Details Name: Tammy Beard MRN: [id number] DOB: 1941/01/27   Cancelled treatment:       Reason Eval/Treat Not Completed:  (pt now on diet with accepted asp risks, will sign off, please reorder if desire)   Claudie Fisherman, [place] [facility] SLP [phone]

## 2016-08-11 NOTE — Progress Notes (Signed)
Assumed care of pt @ 1330.  I agree with previous RN's assessment.  Will continue with plan of care.  Skin assessment done.  Both heels red, non-blanching, stage 2 pressure ulcer to sacrum, ecchymosis to bilateral arms.

## 2016-08-12 ENCOUNTER — Inpatient Hospital Stay (HOSPITAL_COMMUNITY): Payer: Medicare Other

## 2016-08-12 DIAGNOSIS — K567 Ileus, unspecified: Secondary | ICD-10-CM

## 2016-08-12 LAB — PHOSPHORUS: PHOSPHORUS: 2.8 mg/dL (ref 2.5–4.6)

## 2016-08-12 LAB — BASIC METABOLIC PANEL
ANION GAP: 11 (ref 5–15)
Anion gap: 10 (ref 5–15)
BUN: 9 mg/dL (ref 6–20)
BUN: 10 mg/dL (ref 6–20)
CHLORIDE: 94 mmol/L — AB (ref 101–111)
CO2: 29 mmol/L (ref 22–32)
CO2: 28 mmol/L (ref 22–32)
CREATININE: 0.75 mg/dL (ref 0.44–1.00)
Calcium: 8.8 mg/dL — ABNORMAL LOW (ref 8.9–10.3)
Calcium: 8.6 mg/dL — ABNORMAL LOW (ref 8.9–10.3)
Chloride: 92 mmol/L — ABNORMAL LOW (ref 101–111)
Creatinine, Ser: 0.69 mg/dL (ref 0.44–1.00)
GFR calc Af Amer: >60 mL/min (ref >60)
GFR calc non Af Amer: >60 mL/min (ref >60)
GLUCOSE: 110 mg/dL — AB (ref 65–99)
Glucose, Bld: 108 mg/dL — ABNORMAL HIGH (ref 65–99)
Potassium: 2.6 mmol/L — CL (ref 3.5–5.1)
Potassium: 3.5 mmol/L (ref 3.5–5.1)
SODIUM: 134 mmol/L — AB (ref 135–145)
SODIUM: 130 mmol/L — AB (ref 135–145)

## 2016-08-12 LAB — GLUCOSE, CAPILLARY
GLUCOSE-CAPILLARY: 120 mg/dL — AB (ref 65–99)
GLUCOSE-CAPILLARY: 306 mg/dL — AB (ref 65–99)
Glucose-Capillary: 182 mg/dL — ABNORMAL HIGH (ref 65–99)
Glucose-Capillary: 81 mg/dL (ref 65–99)

## 2016-08-12 LAB — MAGNESIUM: Magnesium: 1.9 mg/dL (ref 1.7–2.4)

## 2016-08-12 LAB — CREATININE, SERUM: CREATININE: 0.74 mg/dL (ref 0.44–1.00)

## 2016-08-12 MED ORDER — POTASSIUM CHLORIDE CRYS ER 20 MEQ PO TBCR
40.0000 meq | EXTENDED_RELEASE_TABLET | Freq: Once | ORAL | Status: AC
  Administered 2016-08-12: 40 meq via ORAL
  Filled 2016-08-12: qty 2

## 2016-08-12 MED ORDER — AMOXICILLIN-POT CLAVULANATE 875-125 MG PO TABS: 1.0000 | ORAL_TABLET | Freq: Two times a day (BID) | ORAL | Status: DC

## 2016-08-12 MED ORDER — PIPERACILLIN-TAZOBACTAM 3.375 G IVPB
3.3750 g | Freq: Three times a day (TID) | INTRAVENOUS | Status: DC
  Administered 2016-08-12 – 2016-08-13 (×3): 3.375 g via INTRAVENOUS
  Filled 2016-08-12 (×3): qty 50

## 2016-08-12 MED ORDER — PIPERACILLIN-TAZOBACTAM 3.375 G IVPB 30 MIN: 3.3750 g | Freq: Three times a day (TID) | INTRAVENOUS | Status: DC

## 2016-08-12 MED ORDER — POTASSIUM CHLORIDE CRYS ER 20 MEQ PO TBCR
40.0000 meq | EXTENDED_RELEASE_TABLET | Freq: Once | ORAL | Status: AC
Start: 2016-08-12 — End: 2016-08-12
  Administered 2016-08-12: 40 meq via ORAL
  Filled 2016-08-12: qty 2

## 2016-08-12 MED ORDER — SPIRONOLACTONE 25 MG PO TABS: 25.0000 mg | ORAL_TABLET | Freq: Every day | ORAL | Status: DC

## 2016-08-12 MED ORDER — POTASSIUM CHLORIDE CRYS ER 20 MEQ PO TBCR
40.0000 meq | EXTENDED_RELEASE_TABLET | ORAL | Status: AC
  Administered 2016-08-12 (×2): 40 meq via ORAL
  Filled 2016-08-12 (×2): qty 2

## 2016-08-12 MED ORDER — POTASSIUM CHLORIDE 10 MEQ/100ML IV SOLN
10.0000 meq | INTRAVENOUS | Status: AC
  Administered 2016-08-12 (×6): 10 meq via INTRAVENOUS
  Filled 2016-08-12 (×6): qty 100

## 2016-08-12 NOTE — Progress Notes (Addendum)
PROGRESS NOTE    Tammy Beard   WJX:[id number]  DOB: [birth year]  DOA: 08/08/2016 PCP: [name], MD   Brief Narrative:  Tammy Beard is a 76 y.o. 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 Found to have fever of 100.1, LA 2.96 and bibasilar infiltrates on CXR concerning for pneumonia. UA suggestive of UTI.   Subjective: No complaints.  Assessment & Plan:   Principal Problem:   Severe sepsis - leukocytosis, tachycardia and lactic acidosis with confusion - source may have be lungs and or urine- d/c'd Vancomycin- continued on zosyn - obtain swallow eval to assess for level of dysphagia- she is a known high risk for aspiration and she and her family are aware of the risks - UA has grown Lactobacillus- do not necessarily need to treat this in immune competent individuals, however treatment for pneumonia will treat Lactobacillus as well - switch Zosyn to Augmentin when ileus resolves  Active Problems:   Acute metabolic encephalopathy - due to above- has resolved  Hypokalemia - replacing aggressively daily- normal magnesium and phosphorus - cause uncertain as he she is not having diarrhea- oral intake may be poor or she may be having urinary losses- would consider dose Aldactone however her oral intake to poor and she would easily become dehydration - continue to replace   Abdomen appears distended today - xray reveals ileus- she is eating solid food and not vomiting and therefore will not stop oral intake- have reverted her diet to clear liquids- if she develops vomiting, will need to order an NG tube - ileus likely secondary to hypokalemia  Paroxysmal atrial fibrillation  (HCC) - HR 120-140s- improved with Cardizem infusion at 15 mg/hr and Lopresor - transition to oral Cardizem at a higher dose than home dose and home dose of oral Lopressor - HR in 120 now- increase Lopressor  And follow- may need to use Dig if BP begins to drop - cont Apixaban    Essential hypertension - follow-     Polymyalgia rheumatica  - Prednisone- cont current dose of 5 mg daily    DM (diabetes mellitus), type 2, uncontrolled w/neurologic complication   - low dose SSI    History of stroke-  Left hemiparesis   - with left sided weakness    Vascular dementia - cont Depakote which is likely for mood instability  DVT prophylaxis: Apixaban Code Status: DNR Family Communication: brother Disposition Plan: follow on telemetry Consultants:    Procedures:    Antimicrobials:  Anti-infectives    Start     Dose/Rate Route Frequency Ordered Stop   08/09/16 1600  vancomycin (VANCOCIN) 500 mg in sodium chloride 0.9 % 100 mL IVPB  Status:  Discontinued     500 mg 100 mL/hr over 60 Minutes Intravenous Every 12 hours 08/09/16 0140 08/10/16 0846   08/09/16 1200  ceFEPIme (MAXIPIME) 1 g in dextrose 5 % 50 mL IVPB  Status:  Discontinued     1 g 100 mL/hr over 30 Minutes Intravenous Every 12 hours 08/09/16 0140 08/09/16 0401   08/09/16 1000  trimethoprim (TRIMPEX) tablet 100 mg  Status:  Discontinued     100 mg Oral Daily 08/09/16 0401 08/09/16 1136   08/09/16 0600  piperacillin-tazobactam (ZOSYN) IVPB 3.375 g  3.375 g 12.5 mL/hr over 240 Minutes Intravenous Every 8 hours 08/09/16 0414     08/09/16 0015  ceFEPIme (MAXIPIME) 2 g in dextrose 5 % 50 mL IVPB     2 g 100 mL/hr over 30 Minutes Intravenous  Once 08/09/16 0001 08/09/16 0138   08/09/16 0015  vancomycin (VANCOCIN) IVPB 1000 mg/200 mL premix     1,000 mg 200 mL/hr over 60 Minutes Intravenous  Once 08/09/16 0001 08/09/16 0305       Objective: Vitals:   08/11/16 1200 08/11/16 1535 08/11/16 2054 08/12/16 0436  BP: (!)  152/93 (!) 147/82 (!) 156/79 (!) 139/55  Pulse: (!) 129 (!) 55 (!) 110 98  Resp: 14 16 14 18   Temp: 97.5 F (36.4 C) 98.2 F (36.8 C) 98.4 F (36.9 C) 97.6 F (36.4 C)  TempSrc: Oral Oral Oral Oral  SpO2: 99% 100% 97% 97%  Weight:      Height:        Intake/Output Summary (Last 24 hours) at 08/12/16 1224 Last data filed at 08/12/16 0843  Gross per 24 hour  Intake             3010 ml  Output                0 ml  Net             3010 ml   Filed Weights   08/08/16 2202 08/09/16 0006 08/09/16 0624  Weight: 45.8 kg (101 lb) 54.4 kg (120 lb) 54.4 kg (119 lb 14.9 oz)    Examination: General exam: Appears comfortable  HEENT: PERRLA, oral mucosa moist, no sclera icterus or thrush Respiratory system: Clear to auscultation. Respiratory effort normal. Cardiovascular system: S1 & S2 heard, IIRR.  No murmurs  Gastrointestinal system: Abdomen soft, non-tender, distended and tmypanic to percussion - adequate bowel sounds. No organomegaly Central nervous system: left hemiparesis Extremities: No cyanosis, clubbing or edema Skin: No rashes or ulcers Psychiatry:  Mood & affect appropriate.     Data Reviewed: I have personally reviewed following labs and imaging studies  CBC:  Recent Labs Lab 08/08/16 2224 08/09/16 0405 08/10/16 0638  WBC 11.0* 11.8* 5.2  NEUTROABS 8.9*  --   --   HGB 11.0* 8.9* 9.6*  HCT 34.3* 27.5* 30.5*  MCV 90.0 90.5 90.0  PLT 281 242 413   Basic Metabolic Panel:  Recent Labs Lab 08/08/16 2224 08/09/16 0405 08/09/16 0746 08/10/16 0638 08/11/16 0820 08/11/16 1804 08/12/16 0458  NA 131* 132*  --  130* 130*  --  134*  K 4.3 2.9*  --  2.4* 2.8* 3.9 2.6*  CL 90* 96*  --  88* 89*  --  94*  CO2 25 23  --  29 28  --  29  GLUCOSE 170* 188*  --  138* 135*  --  108*  BUN 18 15  --  8 10  --  9  CREATININE 0.83 0.74  --  0.61 0.63  --  0.69  0.74  CALCIUM 9.7 8.0*  --  8.2* 8.7*  --  8.8*  MG  --   --  1.7  --  2.2  --  1.9  PHOS  --   --   --   --   --    --  2.8   GFR: Estimated Creatinine Clearance: 45.1 mL/min (by C-G formula based on SCr of 0.74 mg/dL). Liver Function Tests:  Recent Labs Lab 08/08/16 2224  AST 26  ALT  14  ALKPHOS 159*  BILITOT 0.4  PROT 7.6  ALBUMIN 3.8   No results for input(s): LIPASE, AMYLASE in the last 168 hours. No results for input(s): AMMONIA in the last 168 hours. Coagulation Profile: No results for input(s): INR, PROTIME in the last 168 hours. Cardiac Enzymes: No results for input(s): CKTOTAL, CKMB, CKMBINDEX, TROPONINI in the last 168 hours. BNP (last 3 results) No results for input(s): PROBNP in the last 8760 hours. HbA1C: No results for input(s): HGBA1C in the last 72 hours. CBG:  Recent Labs Lab 08/11/16 1216 08/11/16 1733 08/11/16 2127 08/12/16 0838 08/12/16 1149  GLUCAP 154* 125* 109* 182* 120*   Lipid Profile: No results for input(s): CHOL, HDL, LDLCALC, TRIG, CHOLHDL, LDLDIRECT in the last 72 hours. Thyroid Function Tests: No results for input(s): TSH, T4TOTAL, FREET4, T3FREE, THYROIDAB in the last 72 hours. Anemia Panel: No results for input(s): VITAMINB12, FOLATE, FERRITIN, TIBC, IRON, RETICCTPCT in the last 72 hours. Urine analysis:    Component Value Date/Time   COLORURINE YELLOW 08/09/2016 0055   APPEARANCEUR TURBID (A) 08/09/2016 0055   LABSPEC 1.014 08/09/2016 0055   PHURINE 5.0 08/09/2016 0055   GLUCOSEU NEGATIVE 08/09/2016 0055   HGBUR SMALL (A) 08/09/2016 0055   HGBUR negative 09/06/2009 1459   BILIRUBINUR NEGATIVE 08/09/2016 0055   BILIRUBINUR NEG 08/23/2010 1440   KETONESUR NEGATIVE 08/09/2016 0055   PROTEINUR 30 (A) 08/09/2016 0055   UROBILINOGEN 0.2 08/23/2010 1440   UROBILINOGEN 0.2 09/06/2009 1459   NITRITE NEGATIVE 08/09/2016 0055   LEUKOCYTESUR LARGE (A) 08/09/2016 0055   Sepsis Labs: @LABRCNTIP (procalcitonin:4,lacticidven:4) ) Recent Results (from the past 240 hour(s))  Blood Culture (routine x 2)     Status: None (Preliminary result)    Collection Time: 08/09/16 12:55 AM  Result Value Ref Range Status   Specimen Description BLOOD RIGHT HAND  Final   Special Requests BOTTLES DRAWN AEROBIC AND ANAEROBIC BCAV  Final   Culture   Final    NO GROWTH 2 DAYS Performed at [facility], [place] [address], [place], [place] [address]    Report Status PENDING  Incomplete  Blood Culture (routine x 2)     Status: None (Preliminary result)   Collection Time: 08/09/16 12:55 AM  Result Value Ref Range Status   Specimen Description BLOOD LEFT HAND  Final   Special Requests IN PEDIATRIC BOTTLE Blood Culture adequate volume  Final   Culture   Final    NO GROWTH 2 DAYS Performed at [facility], [place] [address], [place], [place] [address]    Report Status PENDING  Incomplete  Urine culture     Status: Abnormal   Collection Time: 08/09/16 12:55 AM  Result Value Ref Range Status   Specimen Description URINE, RANDOM  Final   Special Requests NONE  Final   Culture (A)  Final    >=100,000 COLONIES/mL LACTOBACILLUS SPECIES Standardized susceptibility testing for this organism is not available. 70,000 COLONIES/mL YEAST    Report Status 08/10/2016 FINAL  Final  MRSA PCR Screening     Status: None   Collection Time: 08/09/16  3:36 AM  Result Value Ref Range Status   MRSA by PCR NEGATIVE NEGATIVE Final    Comment:        The GeneXpert MRSA Assay (FDA approved for NASAL specimens only), is one component of a comprehensive MRSA colonization surveillance program. It is not intended to diagnose MRSA infection nor to guide or monitor treatment for MRSA infections.  Radiology Studies: Dg Abd Portable 1v  Result Date: 08/12/2016 CLINICAL DATA:  Abdominal distention and constipation for the past few days. EXAM: PORTABLE ABDOMEN - 1 VIEW COMPARISON:  Two views of the abdomen 06/29/2003. FINDINGS: Moderate and diffuse gaseous distention of small and large bowel is identified. Stool burden is unremarkable. No abnormal  abdominal calcification or focal bony abnormality. IMPRESSION: Bowel gas pattern most compatible with ileus. Electronically Signed   By: Inge Rise M.D.   On: 08/12/2016 12:08      Scheduled Meds: . apixaban  5 mg Oral BID  . aspirin  81 mg Oral Daily  . diltiazem  300 mg Oral Daily  . divalproex  125 mg Oral Daily  . famotidine  20 mg Oral QHS  . insulin aspart  0-5 Units Subcutaneous QHS  . insulin aspart  0-9 Units Subcutaneous TID WC  . metoprolol tartrate  25 mg Oral BID  . mirabegron ER  25 mg Oral Daily  . potassium chloride  40 mEq Oral Q4H  . predniSONE  5 mg Oral Q breakfast  . senna  1 tablet Oral Daily   Continuous Infusions: . piperacillin-tazobactam (ZOSYN)  IV Stopped (08/12/16 0926)  . potassium chloride 10 mEq (08/12/16 1219)     LOS: 3 days    Time spent in minutes: 35    Debbe Odea, MD [facility] Pager: [email or web address] Password [facility] 08/12/2016, 12:24 PM

## 2016-08-12 NOTE — Progress Notes (Signed)
CMT called concerning pt decreased HR, MD notified  HR range from 38-73 pt slighly anxious, family in the room, pt had increased HR prior and had medication, pt currently heartrate range is 120-130 non sustaining. MD aware and PRN med ordered. SRP, RN

## 2016-08-12 NOTE — Progress Notes (Signed)
Pharmacy Antibiotic Note  Tammy Beard is a 76 y.o. female admitted on 08/08/2016 from nursing facility for afib.  Broad abx were started on admission for sepsis/PNA.  Patient's currently on zosyn for asp PNA.  Today, 08/12/2016: - day #4 abx - afeb, wbc wnl (last lab on 5/20) - scr stable (crcl~45)  Plan: - continue zosyn 3.375 gm IV q8h (infuse over 4 hours) - with stable renal function, pharmacy will sign off for zosyn.  Re-consult [place] if need further assistance.  ____________________________  Height: 5\' 1"  (154.9 cm) Weight: 119 lb 14.9 oz (54.4 kg) IBW/kg (Calculated) : 47.8  Temp (24hrs), Avg:97.9 F (36.6 C), Min:97.5 F (36.4 C), Max:98.4 F (36.9 C)   Recent Labs Lab 08/08/16 2224 08/09/16 0043 08/09/16 0405 08/09/16 0408 08/10/16 0638 08/11/16 0820 08/12/16 0458  WBC 11.0*  --  11.8*  --  5.2  --   --   CREATININE 0.83  --  0.74  --  0.61 0.63 0.69  0.74  LATICACIDVEN  --  2.96*  --  2.7*  --   --   --     Estimated Creatinine Clearance: 45.1 mL/min (by C-G formula based on SCr of 0.74 mg/dL).    No Known Allergies  Antimicrobials this admission: 5/19 cefepime >> x1 ED 5/19 zosyn >> 5/19 vancomycin >> 5/20  Microbiology results: 5/19 MRSA PCR neg 5/19 strep pneum neg 5/19 HIV neg 5/19 BCx2:  ngtd 5/19 Ucx> > 100k lactobacillus sp, 70k yeast - per MD don't need to Tx in immunocompetent patient, but on chronic prednisone (low dose)  Thank you for allowing pharmacy to be a part of this patient's care.  Lynelle Doctor 08/12/2016 11:30 AM

## 2016-08-12 NOTE — Progress Notes (Signed)
MD notified of potassium results. SRP, RN

## 2016-08-13 ENCOUNTER — Inpatient Hospital Stay (HOSPITAL_COMMUNITY): Payer: Medicare Other

## 2016-08-13 LAB — CBC WITH DIFFERENTIAL/PLATELET
BASOS ABS: 0 10*3/uL (ref 0.0–0.1)
BASOS PCT: 0 %
Eosinophils Absolute: 0 10*3/uL (ref 0.0–0.7)
Eosinophils Relative: 0 %
HEMATOCRIT: 29.4 % — AB (ref 36.0–46.0)
Hemoglobin: 9.1 g/dL — ABNORMAL LOW (ref 12.0–15.0)
LYMPHS PCT: 14 %
Lymphs Abs: 1.2 10*3/uL (ref 0.7–4.0)
MCH: 28.3 pg (ref 26.0–34.0)
MCHC: 31 g/dL (ref 30.0–36.0)
MCV: 91.3 fL (ref 78.0–100.0)
Monocytes Absolute: 1.2 10*3/uL — ABNORMAL HIGH (ref 0.1–1.0)
Monocytes Relative: 13 %
NEUTROS ABS: 6.7 10*3/uL (ref 1.7–7.7)
Neutrophils Relative %: 73 %
PLATELETS: 305 10*3/uL (ref 150–400)
RBC: 3.22 MIL/uL — AB (ref 3.87–5.11)
RDW: 14.7 % (ref 11.5–15.5)
WBC: 9.1 10*3/uL (ref 4.0–10.5)

## 2016-08-13 LAB — PROTIME-INR
INR: 1.4
PROTHROMBIN TIME: 17.2 s — AB (ref 11.4–15.2)

## 2016-08-13 LAB — BASIC METABOLIC PANEL
Anion gap: 14 (ref 5–15)
BUN: 14 mg/dL (ref 6–20)
CHLORIDE: 97 mmol/L — AB (ref 101–111)
CO2: 23 mmol/L (ref 22–32)
Calcium: 9.5 mg/dL (ref 8.9–10.3)
Creatinine, Ser: 0.73 mg/dL (ref 0.44–1.00)
GFR calc non Af Amer: >60 mL/min (ref >60)
Glucose, Bld: 130 mg/dL — ABNORMAL HIGH (ref 65–99)
Potassium: 4.7 mmol/L (ref 3.5–5.1)
SODIUM: 134 mmol/L — AB (ref 135–145)

## 2016-08-13 LAB — COMPREHENSIVE METABOLIC PANEL
ALK PHOS: 175 U/L — AB (ref 38–126)
ALT: 49 U/L (ref 14–54)
AST: 28 U/L (ref 15–41)
Albumin: 2.9 g/dL — ABNORMAL LOW (ref 3.5–5.0)
Anion gap: 12 (ref 5–15)
BILIRUBIN TOTAL: 0.6 mg/dL (ref 0.3–1.2)
BUN: 10 mg/dL (ref 6–20)
CO2: 26 mmol/L (ref 22–32)
Calcium: 8.8 mg/dL — ABNORMAL LOW (ref 8.9–10.3)
Chloride: 97 mmol/L — ABNORMAL LOW (ref 101–111)
Creatinine, Ser: 0.67 mg/dL (ref 0.44–1.00)
GFR calc Af Amer: >60 mL/min (ref >60)
Glucose, Bld: 118 mg/dL — ABNORMAL HIGH (ref 65–99)
POTASSIUM: 2.8 mmol/L — AB (ref 3.5–5.1)
Sodium: 135 mmol/L (ref 135–145)
TOTAL PROTEIN: 6.6 g/dL (ref 6.5–8.1)

## 2016-08-13 LAB — TROPONIN I: Troponin I: 0.03 ng/mL (ref <0.03)

## 2016-08-13 LAB — APTT: aPTT: 39 seconds — ABNORMAL HIGH (ref 24–36)

## 2016-08-13 LAB — GLUCOSE, CAPILLARY
GLUCOSE-CAPILLARY: 113 mg/dL — AB (ref 65–99)
GLUCOSE-CAPILLARY: 115 mg/dL — AB (ref 65–99)
GLUCOSE-CAPILLARY: 110 mg/dL — AB (ref 65–99)
Glucose-Capillary: 131 mg/dL — ABNORMAL HIGH (ref 65–99)
Glucose-Capillary: 100 mg/dL — ABNORMAL HIGH (ref 65–99)

## 2016-08-13 MED ORDER — SODIUM CHLORIDE 0.9 % IV BOLUS (SEPSIS)
500.0000 mL | Freq: Once | INTRAVENOUS | Status: AC
  Administered 2016-08-13: 500 mL via INTRAVENOUS

## 2016-08-13 MED ORDER — SODIUM CHLORIDE 0.9 % IV SOLN
3.0000 g | Freq: Four times a day (QID) | INTRAVENOUS | Status: AC
  Administered 2016-08-13 – 2016-08-16 (×10): 3 g via INTRAVENOUS
  Filled 2016-08-13 (×13): qty 3

## 2016-08-13 MED ORDER — HYDRALAZINE HCL 20 MG/ML IJ SOLN: 5.0000 mg | Freq: Four times a day (QID) | INTRAMUSCULAR | Status: DC | PRN

## 2016-08-13 MED ORDER — METOPROLOL TARTRATE 5 MG/5ML IV SOLN
5.0000 mg | Freq: Once | INTRAVENOUS | Status: AC
  Administered 2016-08-13: 5 mg via INTRAVENOUS

## 2016-08-13 MED ORDER — METOPROLOL TARTRATE 50 MG PO TABS
50.0000 mg | ORAL_TABLET | Freq: Two times a day (BID) | ORAL | Status: DC
  Administered 2016-08-13 – 2016-08-21 (×16): 50 mg via ORAL
  Filled 2016-08-13 (×16): qty 1

## 2016-08-13 MED ORDER — SODIUM CHLORIDE 0.9 % IV SOLN
INTRAVENOUS | Status: DC
  Administered 2016-08-13 – 2016-08-14 (×2): via INTRAVENOUS

## 2016-08-13 MED ORDER — DILTIAZEM HCL-DEXTROSE 100-5 MG/100ML-% IV SOLN (PREMIX)
5.0000 mg/h | INTRAVENOUS | Status: DC
  Administered 2016-08-14 (×2): 15 mg/h via INTRAVENOUS
  Administered 2016-08-14: 5 mg/h via INTRAVENOUS
  Filled 2016-08-13 (×2): qty 100

## 2016-08-13 MED ORDER — METOPROLOL TARTRATE 5 MG/5ML IV SOLN
10.0000 mg | Freq: Once | INTRAVENOUS | Status: AC
  Administered 2016-08-13: 10 mg via INTRAVENOUS
  Filled 2016-08-13: qty 10

## 2016-08-13 MED ORDER — DILTIAZEM LOAD VIA INFUSION
10.0000 mg | Freq: Once | INTRAVENOUS | Status: DC
  Administered 2016-08-13: 10 mg via INTRAVENOUS
  Filled 2016-08-13: qty 10

## 2016-08-13 MED ORDER — METOPROLOL TARTRATE 50 MG PO TABS: 50.0000 mg | ORAL_TABLET | Freq: Two times a day (BID) | ORAL | Status: DC

## 2016-08-13 MED ORDER — INSULIN ASPART 100 UNIT/ML ~~LOC~~ SOLN
0.0000 [IU] | SUBCUTANEOUS | Status: DC
  Administered 2016-08-14: 3 [IU] via SUBCUTANEOUS
  Administered 2016-08-15: 2 [IU] via SUBCUTANEOUS

## 2016-08-13 NOTE — Progress Notes (Signed)
HR sustaining 130's-150's.  MD notified, Metoprolol dose modified to 50mg  BID and ok to give PO dose now.  Will continue to monitor.

## 2016-08-13 NOTE — Progress Notes (Addendum)
[facility] PROGRESS NOTE    Progress Note  Tammy Beard  WOE:[id number] DOB: 10/18/1940 DOA: 08/08/2016 PCP: [name], MD     Brief Narrative:   Tammy Beard is an 76 y.o. female past medical history of CVA with left-sided weakness and neglect, paroxysmal atrial fibrillation CHADS-Vasc score at least 4, anticoagulated with Eliquis, recurrent UTIs suppression therapy, was sent from skilled nursing facility for confusion, cough and tachycardia, she was septic with a chest x-ray concerning for pneumonia.  Assessment/Plan:   Severe sepsis ([place]) due to aspiration PNA: Had a swallowing evaluation to severe dysphagia and high risk for aspiration the family is aware. DC Zosyn starting Unasyn. The likely cause of her sepsis with aspiration recurrent aspiration pneumonia.  Acute metabolic encephalopathy: Resolved likely due to above.  Ileus: X-ray was compatible with ileus she was tolerating foods with no vomiting denied any abdominal pain. She's tolerating her clears. This likely due to hypokalemia.  Paroxysmal atrial fibrillation: Normal Cardizem and metoprolol. HR he hasn't improve to less than 100 with current dose. Continue apixiban  Essential hypertension elevated today, will monitor throughout the day. Add hydralazine IV when necessary.  Polymyalgia rheumatica (HCC) Continue current dose of prednisone.  DM (diabetes mellitus), type 2, uncontrolled w/neurologic complication (HCC) Continue sliding scale insulin.  History of stroke With left-sided hemiparesis, continue physical therapy.  Vascular dementia No hallucinations at this point or aggressiveness. Continued tobacco.  DVT prophylaxis: apixiban Family Communication:brother Disposition Plan/Barrier to D/C: unable to detemrine Code Status:     Code Status Orders        Start     Ordered   08/09/16 0400  Do not attempt resuscitation (DNR)  Continuous    Question Answer Comment  In the  event of cardiac or respiratory ARREST Do not call a "code blue"   In the event of cardiac or respiratory ARREST Do not perform Intubation, CPR, defibrillation or ACLS   In the event of cardiac or respiratory ARREST Use medication by any route, position, wound care, and other measures to relive pain and suffering. May use oxygen, suction and manual treatment of airway obstruction as needed for comfort.      08/09/16 0401    Code Status History    Date Active Date Inactive Code Status Order ID Comments User Context   08/09/2016  1:54 AM 08/09/2016  4:01 AM DNR [id number]  Ward, Delice Bison, DO ED   03/23/2016 10:07 PM 03/27/2016  9:12 PM DNR [id number]  Vianne Bulls, MD ED   11/26/2015  4:29 PM 12/03/2015  7:32 PM DNR [id number]  Waldemar Dickens, MD Inpatient   09/21/2015  9:15 PM 09/26/2015  4:27 PM DNR [id number]  Ivor Costa, MD ED    Advance Directive Documentation     Most Recent Value  Type of Advance Directive  Healthcare Power of Attorney, Living will, Out of facility DNR (pink MOST or yellow form)  Pre-existing out of facility DNR order (yellow form or pink MOST form)  Yellow form placed in chart (order not valid for inpatient use)  "MOST" Form in Place?  -        IV Access:    Peripheral IV   Procedures and diagnostic studies:   Dg Abd Portable 1v  Result Date: 08/13/2016 CLINICAL DATA:  Ileus. EXAM: PORTABLE ABDOMEN - 1 VIEW COMPARISON:  08/12/2016. FINDINGS: Persistent distended loops of small and large bowel noted. Findings consist with mild adynamic ileus. No free air. No acute bony  abnormality. Degenerative changes lumbar spine and both hips. Diffuse osteopenia. Small bilateral pleural effusions . IMPRESSION: 1. Persistent distended loops of small and large bowel noted consistent with adynamic ileus. 2. Small bilateral pleural effusions. Electronically Signed   By: Marcello Moores  Register   On: 08/13/2016 07:18   Dg Abd Portable 1v  Result Date: 08/12/2016 CLINICAL DATA:  Abdominal  distention and constipation for the past few days. EXAM: PORTABLE ABDOMEN - 1 VIEW COMPARISON:  Two views of the abdomen 06/29/2003. FINDINGS: Moderate and diffuse gaseous distention of small and large bowel is identified. Stool burden is unremarkable. No abnormal abdominal calcification or focal bony abnormality. IMPRESSION: Bowel gas pattern most compatible with ileus. Electronically Signed   By: Inge Rise M.D.   On: 08/12/2016 12:08     Medical Consultants:    None.  Anti-Infectives:   Unasyn  Subjective:    Deetta Perla she relates her bottom is bothering her.  Objective:    Vitals:   08/12/16 1300 08/12/16 1615 08/12/16 2207 08/13/16 0457  BP: 140/60 (!) 136/103 (!) 179/143 (!) 172/108  Pulse: 100  (!) 128 (!) 150  Resp: 18  (!) 22 20  Temp: 97.5 F (36.4 C)  97.6 F (36.4 C) 97.4 F (36.3 C)  TempSrc: Oral  Oral Oral  SpO2: 98%  95% 91%  Weight:      Height:        Intake/Output Summary (Last 24 hours) at 08/13/16 0909 Last data filed at 08/13/16 0500  Gross per 24 hour  Intake              650 ml  Output                0 ml  Net              650 ml   Filed Weights   08/08/16 2202 08/09/16 0006 08/09/16 0624  Weight: 45.8 kg (101 lb) 54.4 kg (120 lb) 54.4 kg (119 lb 14.9 oz)    Exam: General exam: In no acute distress. Respiratory system: Good air movement and clear to auscultation. Cardiovascular system:  good air movement clear to auscultation  Gastrointestinal system: Mildly distended positive bowel sounds nontender Central nervous system: alert and oriented Extremities: No edema Skin: no rashes    Data Reviewed:    Labs: Basic Metabolic Panel:  Recent Labs Lab 08/09/16 0746 08/10/16 3532 08/11/16 0820  08/12/16 0458 08/12/16 1238 08/13/16 0534  NA  --  130* 130*  --  134* 130* 134*  K  --  2.4* 2.8*  < > 2.6* 3.5 4.7  CL  --  88* 89*  --  94* 92* 97*  CO2  --  29 28  --  29 28 23   GLUCOSE  --  138* 135*  --  108* 110*  130*  BUN  --  8 10  --  9 10 14   CREATININE  --  0.61 0.63  --  0.69  0.74 0.75 0.73  CALCIUM  --  8.2* 8.7*  --  8.8* 8.6* 9.5  MG 1.7  --  2.2  --  1.9  --   --   PHOS  --   --   --   --  2.8  --   --   < > = values in this interval not displayed. GFR Estimated Creatinine Clearance: 45.1 mL/min (by C-G formula based on SCr of 0.73 mg/dL). Liver Function Tests:  Recent Labs Lab 08/08/16  2224  AST 26  ALT 14  ALKPHOS 159*  BILITOT 0.4  PROT 7.6  ALBUMIN 3.8   No results for input(s): LIPASE, AMYLASE in the last 168 hours. No results for input(s): AMMONIA in the last 168 hours. Coagulation profile No results for input(s): INR, PROTIME in the last 168 hours.  CBC:  Recent Labs Lab 08/08/16 2224 08/09/16 0405 08/10/16 0638  WBC 11.0* 11.8* 5.2  NEUTROABS 8.9*  --   --   HGB 11.0* 8.9* 9.6*  HCT 34.3* 27.5* 30.5*  MCV 90.0 90.5 90.0  PLT 281 242 254   Cardiac Enzymes: No results for input(s): CKTOTAL, CKMB, CKMBINDEX, TROPONINI in the last 168 hours. BNP (last 3 results) No results for input(s): PROBNP in the last 8760 hours. CBG:  Recent Labs Lab 08/12/16 0838 08/12/16 1149 08/12/16 1702 08/12/16 2204 08/13/16 0734  GLUCAP 182* 120* 306* 81 131*   D-Dimer: No results for input(s): DDIMER in the last 72 hours. Hgb A1c: No results for input(s): HGBA1C in the last 72 hours. Lipid Profile: No results for input(s): CHOL, HDL, LDLCALC, TRIG, CHOLHDL, LDLDIRECT in the last 72 hours. Thyroid function studies: No results for input(s): TSH, T4TOTAL, T3FREE, THYROIDAB in the last 72 hours.  Invalid input(s): FREET3 Anemia work up: No results for input(s): VITAMINB12, FOLATE, FERRITIN, TIBC, IRON, RETICCTPCT in the last 72 hours. Sepsis Labs:  Recent Labs Lab 08/08/16 2224 08/09/16 0043 08/09/16 0405 08/09/16 0408 08/10/16 3419  PROCALCITON  --   --  <0.10  --   --   WBC 11.0*  --  11.8*  --  5.2  LATICACIDVEN  --  2.96*  --  2.7*  --     Microbiology Recent Results (from the past 240 hour(s))  Blood Culture (routine x 2)     Status: None (Preliminary result)   Collection Time: 08/09/16 12:55 AM  Result Value Ref Range Status   Specimen Description BLOOD RIGHT HAND  Final   Special Requests BOTTLES DRAWN AEROBIC AND ANAEROBIC BCAV  Final   Culture   Final    NO GROWTH 3 DAYS Performed at [facility] [address], [place], [place] [address]    Report Status PENDING  Incomplete  Blood Culture (routine x 2)     Status: None (Preliminary result)   Collection Time: 08/09/16 12:55 AM  Result Value Ref Range Status   Specimen Description BLOOD LEFT HAND  Final   Special Requests IN PEDIATRIC BOTTLE Blood Culture adequate volume  Final   Culture   Final    NO GROWTH 3 DAYS Performed at [facility], [place] [address] [place], [place] [address]    Report Status PENDING  Incomplete  Urine culture     Status: Abnormal   Collection Time: 08/09/16 12:55 AM  Result Value Ref Range Status   Specimen Description URINE, RANDOM  Final   Special Requests NONE  Final   Culture (A)  Final    >=100,000 COLONIES/mL LACTOBACILLUS SPECIES Standardized susceptibility testing for this organism is not available. 70,000 COLONIES/mL YEAST    Report Status 08/10/2016 FINAL  Final  MRSA PCR Screening     Status: None   Collection Time: 08/09/16  3:36 AM  Result Value Ref Range Status   MRSA by PCR NEGATIVE NEGATIVE Final    Comment:        The GeneXpert MRSA Assay (FDA approved for NASAL specimens only), is one component of a comprehensive MRSA colonization surveillance program. It  is not intended to diagnose MRSA infection nor to guide or monitor treatment for MRSA infections.      Medications:   . apixaban  5 mg Oral BID  . aspirin  81 mg Oral Daily  . diltiazem  300 mg Oral Daily  . divalproex  125 mg Oral Daily  . famotidine  20 mg Oral QHS  . insulin aspart  0-5 Units Subcutaneous QHS  . insulin  aspart  0-9 Units Subcutaneous TID WC  . metoprolol tartrate  25 mg Oral BID  . mirabegron ER  25 mg Oral Daily  . predniSONE  5 mg Oral Q breakfast  . senna  1 tablet Oral Daily   Continuous Infusions: . piperacillin-tazobactam (ZOSYN)  IV Stopped (08/13/16 0857)    Time spent: 25 min   LOS: 4 days   Charlynne Cousins  [facility] Pager [phone]  *Please refer to [email or web address], password [email or web address] to get updated schedule on who will round on this patient, as hospitalists switch teams weekly. If 7PM-7AM, please contact night-coverage at [email or web address], password [email or web address] for any overnight needs.  08/13/2016, 9:09 AM

## 2016-08-13 NOTE — Care Management Important Message (Signed)
Important Message  Patient Details  Name: Tammy Beard MRN: [id number] Date of Birth: 07/14/1940   Medicare Important Message Given:  Yes    Kerin Salen 08/13/2016, 10:52 AMImportant Message  Patient Details  Name: Tammy Beard MRN: [id number] Date of Birth: 1940/04/02   Medicare Important Message Given:  Yes    Kerin Salen 08/13/2016, 10:52 AM

## 2016-08-13 NOTE — Significant Event (Addendum)
Rapid Response Event Note  Overview: Time Called: 1937 Arrival Time: 1940 Event Type: Neurologic  Initial Focused Assessment: RRT RN Remo Lipps and RRT RN Polly Cobia called by Bedside RN Katie in regards to patient having symptoms of a stroke. Upon arrival, patient was awake but altered. Patient inconsistently not able to follow commands. Patient having left facial droop. Patient has left sided weakness in hx from previous stroke but was not able move left arm or left leg upon asking questions within the NIH scale. Patient was disoriented x 4 as well.  NIH scale score of 15 Last Known seen well- Unknown but "Bedside Katie RN reported day shift RN said early afternoon." Patient in Afib RVR, Hypertensive   Interventions: Code Stroke initiated at 2005 Code [employer] initiated and Order Set put in place.  CT Tech Called for STAT CT, 500 cc bolus started, [place] 2Liters, HOB decreased less than 30 degrees-almost flat, 2 IVs already present, Labs per protocol placed, CBG obtained-no hypoglycemia noted, EKG ordered placed will be obtained in ICU. Patient Transferred to CT scan STAT.  Forrest Moron NP at bedside-placed order for metoprolol 10mg -given at bedside    Plan of Care (if not transferred):  Event Summary:   at   Bed RN Called Forrest Moron NP     at          [facility]

## 2016-08-14 ENCOUNTER — Inpatient Hospital Stay (HOSPITAL_COMMUNITY): Payer: Medicare Other

## 2016-08-14 DIAGNOSIS — G9340 Encephalopathy, unspecified: Secondary | ICD-10-CM

## 2016-08-14 DIAGNOSIS — E43 Unspecified severe protein-calorie malnutrition: Secondary | ICD-10-CM | POA: Insufficient documentation

## 2016-08-14 LAB — BASIC METABOLIC PANEL
Anion gap: 12 (ref 5–15)
BUN: 6 mg/dL (ref 6–20)
CO2: 26 mmol/L (ref 22–32)
CREATININE: 0.68 mg/dL (ref 0.44–1.00)
Calcium: 8.5 mg/dL — ABNORMAL LOW (ref 8.9–10.3)
Chloride: 98 mmol/L — ABNORMAL LOW (ref 101–111)
GFR calc non Af Amer: >60 mL/min (ref >60)
Glucose, Bld: 123 mg/dL — ABNORMAL HIGH (ref 65–99)
Potassium: 2.4 mmol/L — CL (ref 3.5–5.1)
SODIUM: 136 mmol/L (ref 135–145)

## 2016-08-14 LAB — CULTURE, BLOOD (ROUTINE X 2)
CULTURE: NO GROWTH
CULTURE: NO GROWTH
SPECIAL REQUESTS: ADEQUATE

## 2016-08-14 LAB — MRSA PCR SCREENING: MRSA BY PCR: NEGATIVE

## 2016-08-14 LAB — GLUCOSE, CAPILLARY
GLUCOSE-CAPILLARY: 94 mg/dL (ref 65–99)
GLUCOSE-CAPILLARY: 204 mg/dL — AB (ref 65–99)
GLUCOSE-CAPILLARY: 399 mg/dL — AB (ref 65–99)
GLUCOSE-CAPILLARY: 128 mg/dL — AB (ref 65–99)
Glucose-Capillary: 92 mg/dL (ref 65–99)
Glucose-Capillary: 120 mg/dL — ABNORMAL HIGH (ref 65–99)

## 2016-08-14 MED ORDER — POTASSIUM CHLORIDE 20 MEQ/15ML (10%) PO SOLN
40.0000 meq | Freq: Once | ORAL | Status: DC
  Filled 2016-08-14: qty 30

## 2016-08-14 MED ORDER — DILTIAZEM HCL 100 MG IV SOLR
5.0000 mg/h | INTRAVENOUS | Status: DC
  Administered 2016-08-14 – 2016-08-18 (×11): 15 mg/h via INTRAVENOUS
  Filled 2016-08-14 (×12): qty 100

## 2016-08-14 MED ORDER — POTASSIUM CHLORIDE CRYS ER 20 MEQ PO TBCR: 40.0000 meq | EXTENDED_RELEASE_TABLET | Freq: Once | ORAL | Status: DC

## 2016-08-14 MED ORDER — METOPROLOL TARTRATE 5 MG/5ML IV SOLN
5.0000 mg | INTRAVENOUS | Status: DC | PRN
  Administered 2016-08-14 – 2016-08-21 (×10): 5 mg via INTRAVENOUS
  Filled 2016-08-14 (×11): qty 5

## 2016-08-14 MED ORDER — POTASSIUM CHLORIDE CRYS ER 20 MEQ PO TBCR
40.0000 meq | EXTENDED_RELEASE_TABLET | Freq: Once | ORAL | Status: AC
  Administered 2016-08-14: 40 meq via ORAL
  Filled 2016-08-14: qty 2

## 2016-08-14 MED ORDER — POTASSIUM PHOSPHATES 15 MMOLE/5ML IV SOLN
20.0000 meq | Freq: Once | INTRAVENOUS | Status: AC
  Administered 2016-08-14: 20 meq via INTRAVENOUS
  Filled 2016-08-14: qty 4.55

## 2016-08-14 MED ORDER — MAGNESIUM SULFATE IN D5W 1-5 GM/100ML-% IV SOLN
1.0000 g | Freq: Once | INTRAVENOUS | Status: AC
  Administered 2016-08-14: 1 g via INTRAVENOUS
  Filled 2016-08-14: qty 100

## 2016-08-14 MED ORDER — SODIUM CHLORIDE 0.9 % IV BOLUS (SEPSIS)
250.0000 mL | Freq: Once | INTRAVENOUS | Status: AC
  Administered 2016-08-14: 250 mL via INTRAVENOUS

## 2016-08-14 NOTE — Progress Notes (Signed)
SLP Cancellation Note  Patient Details Name: Tammy Beard MRN: [id number] DOB: 03-16-1941   Cancelled treatment:       Reason Eval/Treat Not Completed: Other (comment) Orders received for new swallow evaluation. Swallow evaluation was completed on 5/19 with pt at a high aspiration risk with all consistencies, also with concern for high risk for aspiration related infection. SLP signed off on 5/21 when MD initiated diet as pt and family were accepting of the risks of aspiration. Discussed with MD - will defer swallow evaluation for now with diet recommendation per MD. Should pt/family wishes change, please re-consult SLP. Thank you.   Germain Osgood 08/14/2016, 12:47 PM  Germain Osgood, M.A. CCC-SLP [phone]

## 2016-08-14 NOTE — Consult Note (Signed)
NEURO HOSPITALIST CONSULT NOTE   Requestig physician: Dr. Hal Hope  Reason for Consult: AMS, possible stroke  History obtained from:   Chart and admitting team at [facility]  HPI:                                                                                                                                          Tammy Beard is an 76 y.o. female who was initially admitted to [facility] hospital on 5/19 for tachycardia and AMS. 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 She is anticoagulated with Eliquis. She has recurrent UTIs. Also with baseline cognitive impairment and hearing loss. She was LKN 2 days prior to admission with gradual onset afterwards of increased confusion and not feeling well. A new wet cough and tachycardia were noted. She was sent to the ED by staff at her SNF for further evaluation. Her rectal temp was 100.1 on arrival, with lactate of 2.96, Na of 131 and WBC of 11. UTI and PNA were diagnosed and she was started on antibiotics. PMHx also includes polymyalgia rheumatica. On Depakote 125 mg po qd which is not a standard regimen for seizures and may be for management of behavior or mood.   During her stay, an ileus was also diagnosed. On Wednesday, she was at her baseline at approximately noon. Afterwards, her mentation began to decline. A Code Stroke was eventually activated after the 7 PM shift change. No clear focal symptoms that were new could be determined, but nursing staff felt that her AMS could be secondary to a stroke. She was out of the tPA and thrombectomy time windows at the time the Code Stroke was called. A CT head was obtained, ruling out a hemorrhage. [facility] team felt that aphasia secondary to stroke was still likely, so she was transported to [facility] ED for evaluation by neurologist followed by possible CTA of head and neck with CTP study to stratify for possible endovascular intervention. Upon arrival, patient  was awake but altered and was not able to consistently follow commands. A left facial droop was noted in addition to left sided weakness consistent with deficits from previous stroke. In the context of patient's inability to consistently follow commands, NIHSS was 15. Patient was in a-fib with RVR and was hypertensive.  Past Medical History:  Diagnosis Date  . Colitis   . Ecchymosis 11/15/2015  . Fracture of right radius 02/2016  . GERD (gastroesophageal reflux disease)   . Heart murmur   . Hyperlipidemia   . Hypertension   . Osteopenia    s/p 7 yrs Fosamax  . Polio   . Polymyalgia ([place])   . Rheumatic fever   . Sleep disorder   . Stroke ([place])   .  Ulcerative colitis     Past Surgical History:  Procedure Laterality Date  . BREAST LUMPECTOMY  1986   left; benign  . CATARACT EXTRACTION     Left eye   . COLONOSCOPY  2006  . Colonosopy  07/05/13   Dr. Deatra Ina  . DILATION AND CURETTAGE OF UTERUS  12/1999  . MELANOMA EXCISION  2007   left arm  . NECK SURGERY  1986   Benign growth on neck removed  . TONSILLECTOMY      Family History  Problem Relation Age of Onset  . Pancreatic cancer Other   . Other Mother        Coad/ Bronchiectosis  . Diabetes Paternal Grandmother   . Heart failure Paternal Grandmother   . Cancer Paternal Aunt        unaware of primary site  . Coronary artery disease Neg Hx   . Colon cancer Neg Hx   . Rectal cancer Neg Hx   . Stomach cancer Neg Hx   . Heart attack Neg Hx    Social History:  reports that she has never smoked. She has never used smokeless tobacco. She reports that she drinks alcohol. She reports that she does not use drugs.  No Known Allergies  MEDICATIONS:                                                                                                                     Scheduled: . apixaban  5 mg Oral BID  . aspirin  81 mg Oral Daily  . insulin aspart  0-9 Units Subcutaneous Q4H  . metoprolol tartrate  50 mg Oral BID  . predniSONE   5 mg Oral Q breakfast     ROS:                                                                                                                                       Unable to obtain due to AMS.   Blood pressure 135/81, pulse (!) 113, temperature 97.4 F (36.3 C), temperature source Axillary, resp. rate 18, height 5\' 1"  (1.549 m), weight 45.9 kg (101 lb 1.6 oz), SpO2 98 %.  General Examination:  HEENT-  [place]/AT    Lungs- Respirations unlabored Extremities- Atrophy of lower legs bilaterally.   Neurological Examination Mental Status: Agitated with poor attention. Unable to cooperate consistently with exam. Exclaims frequently while blood sample is being obtained. Follows only some simple commands. There is a waxing and waning component to her AMS. Occasionally exhibits a blank stare at which time exclamations temporarily stop, followed by renewed agitation. No motor activity suggestive of seizure noted during these episodes.  Cranial Nerves: II: Will briefly fixate on visual stimuli, but does not blink to threat. PERRL  III,IV, VI: Exotropia noted. Will not track to command but will gaze in the direction of some visual or tactile stimuli. No nystagmus.  V,VII: Mild left facial droop. Reacts to tactile stimuli.  VIII: HOH requiring loud commands and questions IX,X: palate symmetric XI: Not following commands XII: midline tongue noted when mouth is open Motor/Sensory: Does not follow commands for formal testing. LUE weaker than RUE. Will move lower extremities to noxious stimuli but becomes agitated and uncooperative with further attempts. Decreased muscle bulk lower legs bilaterally.  Deep Tendon Reflexes: Hypoactive patellae and achilles reflexes. Unable to obtain upper extremity reflexes due to agitation.  Cerebellar: No gross ataxia.  Gait: Deferred  Lab Results: Basic Metabolic  Panel:  Recent Labs Lab 08/09/16 0746  08/11/16 0820 08/11/16 1804 08/12/16 0458 08/12/16 1238 08/13/16 0534 08/13/16 2126  NA  --   < > 130*  --  134* 130* 134* 135  K  --   < > 2.8* 3.9 2.6* 3.5 4.7 2.8*  CL  --   < > 89*  --  94* 92* 97* 97*  CO2  --   < > 28  --  29 28 23 26   GLUCOSE  --   < > 135*  --  108* 110* 130* 118*  BUN  --   < > 10  --  9 10 14 10   CREATININE  --   < > 0.63  --  0.69  0.74 0.75 0.73 0.67  CALCIUM  --   < > 8.7*  --  8.8* 8.6* 9.5 8.8*  MG 1.7  --  2.2  --  1.9  --   --   --   PHOS  --   --   --   --  2.8  --   --   --   < > = values in this interval not displayed.  Liver Function Tests:  Recent Labs Lab 08/08/16 2224 08/13/16 2126  AST 26 28  ALT 14 49  ALKPHOS 159* 175*  BILITOT 0.4 0.6  PROT 7.6 6.6  ALBUMIN 3.8 2.9*   No results for input(s): LIPASE, AMYLASE in the last 168 hours. No results for input(s): AMMONIA in the last 168 hours.  CBC:  Recent Labs Lab 08/08/16 2224 08/09/16 0405 08/10/16 0638 08/13/16 2126  WBC 11.0* 11.8* 5.2 9.1  NEUTROABS 8.9*  --   --  6.7  HGB 11.0* 8.9* 9.6* 9.1*  HCT 34.3* 27.5* 30.5* 29.4*  MCV 90.0 90.5 90.0 91.3  PLT 281 242 254 305    Cardiac Enzymes:  Recent Labs Lab 08/13/16 2126  TROPONINI 0.03*    Lipid Panel: No results for input(s): CHOL, TRIG, HDL, CHOLHDL, VLDL, LDLCALC in the last 168 hours.  CBG:  Recent Labs Lab 08/13/16 0734 08/13/16 1220 08/13/16 1721 08/13/16 1958 08/13/16 2325  GLUCAP 131* 100* 113* 115* 110*    Microbiology: Results for orders placed or performed  during the hospital encounter of 08/08/16  Blood Culture (routine x 2)     Status: None (Preliminary result)   Collection Time: 08/09/16 12:55 AM  Result Value Ref Range Status   Specimen Description BLOOD RIGHT HAND  Final   Special Requests BOTTLES DRAWN AEROBIC AND ANAEROBIC BCAV  Final   Culture   Final    NO GROWTH 4 DAYS Performed at [facility], [address]  [place], [place] [address]    Report Status PENDING  Incomplete  Blood Culture (routine x 2)     Status: None (Preliminary result)   Collection Time: 08/09/16 12:55 AM  Result Value Ref Range Status   Specimen Description BLOOD LEFT HAND  Final   Special Requests IN PEDIATRIC BOTTLE Blood Culture adequate volume  Final   Culture   Final    NO GROWTH 4 DAYS Performed at [facility], [place] [address] [place], [place] [address]    Report Status PENDING  Incomplete  Urine culture     Status: Abnormal   Collection Time: 08/09/16 12:55 AM  Result Value Ref Range Status   Specimen Description URINE, RANDOM  Final   Special Requests NONE  Final   Culture (A)  Final    >=100,000 COLONIES/mL LACTOBACILLUS SPECIES Standardized susceptibility testing for this organism is not available. 70,000 COLONIES/mL YEAST    Report Status 08/10/2016 FINAL  Final  MRSA PCR Screening     Status: None   Collection Time: 08/09/16  3:36 AM  Result Value Ref Range Status   MRSA by PCR NEGATIVE NEGATIVE Final    Comment:        The GeneXpert MRSA Assay (FDA approved for NASAL specimens only), is one component of a comprehensive MRSA colonization surveillance program. It is not intended to diagnose MRSA infection nor to guide or monitor treatment for MRSA infections.     Coagulation Studies:  Recent Labs  08/13/16 2126  LABPROT 17.2*  INR 1.40    Imaging: Dg Abd 1 View  Result Date: 08/13/2016 CLINICAL DATA:  Followup ileus. EXAM: ABDOMEN - 1 VIEW COMPARISON:  Earlier today FINDINGS: Unchanged diffuse gas dilatation of small and large bowel. Bladder shadow noted; no concerning mass effect. No convincing extraluminal gas. IMPRESSION: History of ileus with unchanged gaseous distension of small and large bowel. No change compared to earlier today. Electronically Signed   By: Monte Fantasia M.D.   On: 08/13/2016 14:35   Ct Head Wo Contrast  Result Date: 08/13/2016 CLINICAL DATA:  76 y/o F;  aphasia and left-sided symptoms with history of stroke. Interval mental status change. EXAM: CT HEAD WITHOUT CONTRAST TECHNIQUE: Contiguous axial images were obtained from the base of the skull through the vertex without intravenous contrast. COMPARISON:  08/09/2016 CT of the head. FINDINGS: Brain: No large acute territory infarct or intracranial hemorrhage identified. Chronic right frontal parietal region infarction is unchanged from prior CT of head. Background of mild to moderate chronic microvascular ischemic changes in white matter in parenchymal volume loss. 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 Vascular: Mild calcific atherosclerosis of carotid siphons. No hyperdense vessel identified. Skull: Normal. Negative for fracture or focal lesion. Sinuses/Orbits: Partial bilateral mastoid opacification. Visualized paranasal sinuses are clear. Underpneumatized frontal sinuses. Bilateral intra-ocular lens replacement. Other: None. IMPRESSION: 1. No acute intracranial abnormality identified. 2. Stable appearance of right frontal parietal region chronic infarction, moderate chronic microvascular ischemic changes of white matter, and parenchymal volume loss of the brain. These  results were called by telephone at the time of interpretation on 08/13/2016 at 8:41 pm to Dr. Clance Boll , who verbally acknowledged these results. Electronically Signed   By: Kristine Garbe M.D.   On: 08/13/2016 20:44   Dg Abd Portable 1v  Result Date: 08/13/2016 CLINICAL DATA:  Ileus. EXAM: PORTABLE ABDOMEN - 1 VIEW COMPARISON:  08/12/2016. FINDINGS: Persistent distended loops of small and large bowel noted. Findings consist with mild adynamic ileus. No free air. No acute bony abnormality. Degenerative changes lumbar spine and both hips. Diffuse osteopenia. Small bilateral pleural effusions . IMPRESSION: 1. Persistent distended loops of small and large bowel noted consistent with  adynamic ileus. 2. Small bilateral pleural effusions. Electronically Signed   By: Marcello Moores  Register   On: 08/13/2016 07:18   Dg Abd Portable 1v  Result Date: 08/12/2016 CLINICAL DATA:  Abdominal distention and constipation for the past few days. EXAM: PORTABLE ABDOMEN - 1 VIEW COMPARISON:  Two views of the abdomen 06/29/2003. FINDINGS: Moderate and diffuse gaseous distention of small and large bowel is identified. Stool burden is unremarkable. No abnormal abdominal calcification or focal bony abnormality. IMPRESSION: Bowel gas pattern most compatible with ileus. Electronically Signed   By: Inge Rise M.D.   On: 08/12/2016 12:08   Assessment: AMS, likely multifactorial 1. AMS most likely multifactorial, with contributing factors including infection, possible pain, old strokes and hospital confinement. Overall clinical picture most consistent with a delirium. Her speech pattern does not militate in favor of a receptive or expressive aphasia.  2. Subclinical seizure with postictal state is also a consideration, but is felt to be unlikely.  3. CTA/CTP risks outweigh potential benefits given #1, above.  4. Atrial fibrillation, on Eliquis. 5. Polymyalgia rheumatica, on prednisone.    Recommendations: 1. EEG in AM.  2. MRI brain when able to tolerate.  3. Avoid medications with sedating or anticholinergic effects.  4. Continue to treat infections.  5. Management of ileus.  6. Evaluate for a possible source of pain. One potential source of pain would be exacerbation of her polymyalgia rheumatica. Consider rheumatology consult.  7. Reorient frequently.  8. Maintain diurnal cycle.   Electronically signed: Dr. Kerney Elbe 08/14/2016, 12:53 AM

## 2016-08-14 NOTE — Progress Notes (Signed)
[facility] PROGRESS NOTE    Progress Note  Tammy Beard  HMC:[id number] DOB: [birth year] DOA: 08/08/2016 PCP: [name], MD     Brief Narrative:   Tammy Beard is an 76 y.o. female past medical history of CVA with left-sided weakness and neglect, paroxysmal atrial fibrillation CHADS-Vasc score at least 4, anticoagulated with Eliquis, recurrent UTIs suppression therapy, was sent from skilled nursing facility for confusion, cough and tachycardia, she was septic with a chest x-ray concerning for pneumonia.  Assessment/Plan:   Severe sepsis ([place]) due to aspiration PNA: Had a swallowing evaluation to severe dysphagia and high risk for aspiration the family is aware. DC Zosyn starting Unasyn. The likely cause of her sepsis with aspiration recurrent aspiration pneumonia. - discussed with speech therapy who states that there is nothing that they can offer to decrease risk of aspiration. Family aware and would like to continue feeds - Continue Unasyn  Acute metabolic encephalopathy: Resolved likely due to above.  Ileus: X-ray was compatible with ileus she was tolerating foods with no vomiting denied any abdominal pain. Advance diet and see  Paroxysmal atrial fibrillation: Normal Cardizem and metoprolol. HR he hasn't improve to less than 100 with current dose. Continue apixiban  Essential hypertension elevated today, will monitor throughout the day. Add hydralazine IV when necessary.  Polymyalgia rheumatica (HCC) Continue current dose of prednisone.  DM (diabetes mellitus), type 2, uncontrolled w/neurologic complication (HCC) Continue sliding scale insulin.  Hypokalemia -reassess  History of stroke With left-sided hemiparesis, continue physical therapy.  Vascular dementia No hallucinations at this point or aggressiveness. Continued tobacco.  DVT prophylaxis: apixiban Family Communication:brother Disposition Plan/Barrier to D/C: unable to detemrine Code  Status:     Code Status Orders        Start     Ordered   08/09/16 0400  Do not attempt resuscitation (DNR)  Continuous    Question Answer Comment  In the event of cardiac or respiratory ARREST Do not call a "code blue"   In the event of cardiac or respiratory ARREST Do not perform Intubation, CPR, defibrillation or ACLS   In the event of cardiac or respiratory ARREST Use medication by any route, position, wound care, and other measures to relive pain and suffering. May use oxygen, suction and manual treatment of airway obstruction as needed for comfort.      08/09/16 0401    Code Status History    Date Active Date Inactive Code Status Order ID Comments User Context   08/09/2016  1:54 AM 08/09/2016  4:01 AM DNR [id number]  Ward, Delice Bison, DO ED   03/23/2016 10:07 PM 03/27/2016  9:12 PM DNR [id number]  Vianne Bulls, MD ED   11/26/2015  4:29 PM 12/03/2015  7:32 PM DNR [id number]  Waldemar Dickens, MD Inpatient   09/21/2015  9:15 PM 09/26/2015  4:27 PM DNR [id number]  Ivor Costa, MD ED    Advance Directive Documentation     Most Recent Value  Type of Advance Directive  Healthcare Power of Attorney, Living will, Out of facility DNR (pink MOST or yellow form)  Pre-existing out of facility DNR order (yellow form or pink MOST form)  Yellow form placed in chart (order not valid for inpatient use)  "MOST" Form in Place?  -        IV Access:    Peripheral IV   Procedures and diagnostic studies:   Dg Abd 1 View  Result Date: 08/13/2016 CLINICAL DATA:  Followup ileus. EXAM:  ABDOMEN - 1 VIEW COMPARISON:  Earlier today FINDINGS: Unchanged diffuse gas dilatation of small and large bowel. Bladder shadow noted; no concerning mass effect. No convincing extraluminal gas. IMPRESSION: History of ileus with unchanged gaseous distension of small and large bowel. No change compared to earlier today. Electronically Signed   By: Monte Fantasia M.D.   On: 08/13/2016 14:35   Ct Head Wo Contrast  Result  Date: 08/13/2016 CLINICAL DATA:  76 y/o F; aphasia and left-sided symptoms with history of stroke. Interval mental status change. EXAM: CT HEAD WITHOUT CONTRAST TECHNIQUE: Contiguous axial images were obtained from the base of the skull through the vertex without intravenous contrast. COMPARISON:  08/09/2016 CT of the head. FINDINGS: Brain: No large acute territory infarct or intracranial hemorrhage identified. Chronic right frontal parietal region infarction is unchanged from prior CT of head. Background of mild to moderate chronic microvascular ischemic changes in white matter in parenchymal volume loss. 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 Vascular: Mild calcific atherosclerosis of carotid siphons. No hyperdense vessel identified. Skull: Normal. Negative for fracture or focal lesion. Sinuses/Orbits: Partial bilateral mastoid opacification. Visualized paranasal sinuses are clear. Underpneumatized frontal sinuses. Bilateral intra-ocular lens replacement. Other: None. IMPRESSION: 1. No acute intracranial abnormality identified. 2. Stable appearance of right frontal parietal region chronic infarction, moderate chronic microvascular ischemic changes of white matter, and parenchymal volume loss of the brain. These results were called by telephone at the time of interpretation on 08/13/2016 at 8:41 pm to Dr. Clance Boll , who verbally acknowledged these results. Electronically Signed   By: Kristine Garbe M.D.   On: 08/13/2016 20:44   Dg Abd Portable 1v  Result Date: 08/13/2016 CLINICAL DATA:  Ileus. EXAM: PORTABLE ABDOMEN - 1 VIEW COMPARISON:  08/12/2016. FINDINGS: Persistent distended loops of small and large bowel noted. Findings consist with mild adynamic ileus. No free air. No acute bony abnormality. Degenerative changes lumbar spine and both hips. Diffuse osteopenia. Small bilateral pleural effusions . IMPRESSION: 1. Persistent distended loops of small  and large bowel noted consistent with adynamic ileus. 2. Small bilateral pleural effusions. Electronically Signed   By: Marcello Moores  Register   On: 08/13/2016 07:18     Medical Consultants:    None.  Anti-Infectives:   Unasyn  Subjective:    Tammy Beard Pt has no new complaints.  Objective:    Vitals:   08/14/16 0137 08/14/16 0726 08/14/16 1148 08/14/16 1613  BP: 126/82 133/89 (!) 126/110 125/81  Pulse:  69 61 (!) 107  Resp: 19 17 16 17   Temp:  98.8 F (37.1 C) 97.9 F (36.6 C) 98.1 F (36.7 C)  TempSrc:  Oral Oral Oral  SpO2:  100% 100% 94%  Weight:      Height:        Intake/Output Summary (Last 24 hours) at 08/14/16 1615 Last data filed at 08/14/16 0400  Gross per 24 hour  Intake           629.01 ml  Output                0 ml  Net           629.01 ml   Filed Weights   08/09/16 0006 08/09/16 0624 08/13/16 2314  Weight: 54.4 kg (120 lb) 54.4 kg (119 lb 14.9 oz) 45.9 kg (101 lb 1.6 oz)    Exam: General exam: In no acute distress. Respiratory system: Good air movement and clear to auscultation. Cardiovascular system:  good air  movement clear to auscultation  Gastrointestinal system: Mildly distended positive bowel sounds nontender Central nervous system: alert and oriented Extremities: No edema Skin: no rashes    Data Reviewed:    Labs: Basic Metabolic Panel:  Recent Labs Lab 08/09/16 0746  08/11/16 0820  08/12/16 0458 08/12/16 1238 08/13/16 0534 08/13/16 2126  NA  --   < > 130*  --  134* 130* 134* 135  K  --   < > 2.8*  < > 2.6* 3.5 4.7 2.8*  CL  --   < > 89*  --  94* 92* 97* 97*  CO2  --   < > 28  --  29 28 23 26   GLUCOSE  --   < > 135*  --  108* 110* 130* 118*  BUN  --   < > 10  --  9 10 14 10   CREATININE  --   < > 0.63  --  0.69  0.74 0.75 0.73 0.67  CALCIUM  --   < > 8.7*  --  8.8* 8.6* 9.5 8.8*  MG 1.7  --  2.2  --  1.9  --   --   --   PHOS  --   --   --   --  2.8  --   --   --   < > = values in this interval not  displayed. GFR Estimated Creatinine Clearance: 43.4 mL/min (by C-G formula based on SCr of 0.67 mg/dL). Liver Function Tests:  Recent Labs Lab 08/08/16 2224 08/13/16 2126  AST 26 28  ALT 14 49  ALKPHOS 159* 175*  BILITOT 0.4 0.6  PROT 7.6 6.6  ALBUMIN 3.8 2.9*   No results for input(s): LIPASE, AMYLASE in the last 168 hours. No results for input(s): AMMONIA in the last 168 hours. Coagulation profile  Recent Labs Lab 08/13/16 2126  INR 1.40    CBC:  Recent Labs Lab 08/08/16 2224 08/09/16 0405 08/10/16 0638 08/13/16 2126  WBC 11.0* 11.8* 5.2 9.1  NEUTROABS 8.9*  --   --  6.7  HGB 11.0* 8.9* 9.6* 9.1*  HCT 34.3* 27.5* 30.5* 29.4*  MCV 90.0 90.5 90.0 91.3  PLT 281 242 254 305   Cardiac Enzymes:  Recent Labs Lab 08/13/16 2126  TROPONINI 0.03*   BNP (last 3 results) No results for input(s): PROBNP in the last 8760 hours. CBG:  Recent Labs Lab 08/13/16 1958 08/13/16 2325 08/14/16 0356 08/14/16 0724 08/14/16 1146  GLUCAP 115* 110* 92 94 120*   D-Dimer: No results for input(s): DDIMER in the last 72 hours. Hgb A1c: No results for input(s): HGBA1C in the last 72 hours. Lipid Profile: No results for input(s): CHOL, HDL, LDLCALC, TRIG, CHOLHDL, LDLDIRECT in the last 72 hours. Thyroid function studies: No results for input(s): TSH, T4TOTAL, T3FREE, THYROIDAB in the last 72 hours.  Invalid input(s): FREET3 Anemia work up: No results for input(s): VITAMINB12, FOLATE, FERRITIN, TIBC, IRON, RETICCTPCT in the last 72 hours. Sepsis Labs:  Recent Labs Lab 08/08/16 2224 08/09/16 0043 08/09/16 0405 08/09/16 0408 08/10/16 1610 08/13/16 2126  PROCALCITON  --   --  <0.10  --   --   --   WBC 11.0*  --  11.8*  --  5.2 9.1  LATICACIDVEN  --  2.96*  --  2.7*  --   --    Microbiology Recent Results (from the past 240 hour(s))  Blood Culture (routine x 2)     Status: None   Collection  Time: 08/09/16 12:55 AM  Result Value Ref Range Status   Specimen  Description BLOOD RIGHT HAND  Final   Special Requests BOTTLES DRAWN AEROBIC AND ANAEROBIC BCAV  Final   Culture   Final    NO GROWTH 5 DAYS Performed at [facility], [address], [place], [place] [address]    Report Status 08/14/2016 FINAL  Final  Blood Culture (routine x 2)     Status: None   Collection Time: 08/09/16 12:55 AM  Result Value Ref Range Status   Specimen Description BLOOD LEFT HAND  Final   Special Requests IN PEDIATRIC BOTTLE Blood Culture adequate volume  Final   Culture   Final    NO GROWTH 5 DAYS Performed at [facility], [place] [address], [place], [place] [address]    Report Status 08/14/2016 FINAL  Final  Urine culture     Status: Abnormal   Collection Time: 08/09/16 12:55 AM  Result Value Ref Range Status   Specimen Description URINE, RANDOM  Final   Special Requests NONE  Final   Culture (A)  Final    >=100,000 COLONIES/mL LACTOBACILLUS SPECIES Standardized susceptibility testing for this organism is not available. 70,000 COLONIES/mL YEAST    Report Status 08/10/2016 FINAL  Final  MRSA PCR Screening     Status: None   Collection Time: 08/09/16  3:36 AM  Result Value Ref Range Status   MRSA by PCR NEGATIVE NEGATIVE Final    Comment:        The GeneXpert MRSA Assay (FDA approved for NASAL specimens only), is one component of a comprehensive MRSA colonization surveillance program. It is not intended to diagnose MRSA infection nor to guide or monitor treatment for MRSA infections.   MRSA PCR Screening     Status: None   Collection Time: 08/13/16 11:12 PM  Result Value Ref Range Status   MRSA by PCR NEGATIVE NEGATIVE Final    Comment:        The GeneXpert MRSA Assay (FDA approved for NASAL specimens only), is one component of a comprehensive MRSA colonization surveillance program. It is not intended to diagnose MRSA infection nor to guide or monitor treatment for MRSA infections.      Medications:   . apixaban  5 mg Oral  BID  . aspirin  81 mg Oral Daily  . insulin aspart  0-9 Units Subcutaneous Q4H  . metoprolol tartrate  50 mg Oral BID  . predniSONE  5 mg Oral Q breakfast   Continuous Infusions: . sodium chloride 100 mL/hr at 08/14/16 1349  . ampicillin-sulbactam (UNASYN) IV 3 g (08/14/16 1528)  . diltiazem (CARDIZEM) infusion 15 mg/hr (08/14/16 1127)    Time spent: 25 min   LOS: 5 days   Velvet Bathe  [facility] Pager [phone]  *Please refer to [email or web address], password [email or web address] to get updated schedule on who will round on this patient, as hospitalists switch teams weekly. If 7PM-7AM, please contact night-coverage at [email or web address], password [email or web address] for any overnight needs.  08/14/2016, 4:15 PM

## 2016-08-14 NOTE — Progress Notes (Signed)
Upon assessing patient with Aldona Bar RN, the patient was not able to follow commands. Speech was slightly slurred and when asked questions she had delayed, one answer responses. She was only able to tell me her name was "Rosalind" and when asked "what are you doing?", she responded "sleeping." Only alert to self. Vital signs obtained and hypertensive/in A.fib. Rapid response called. Tylene Fantasia, NP paged and made aware. Orders obtained and followed out by this RN. Patient was taken to CT by bedside RN Aldona Bar, RRT, and Tylene Fantasia NP.

## 2016-08-14 NOTE — Progress Notes (Signed)
EEG Completed; Results Pending  

## 2016-08-14 NOTE — Progress Notes (Signed)
Shift event:  NP was called by RN around [phone] because pt was still in Afib with RVR with HR 120s to 140s and had neuro status change. RRRN x2 at bedside. NP to bedside stat. NIH 15. BP 160s and HR still high, so Metoprolol 10mg  IV given. (Had previously had 2 doses of Metoprolol on days).  S: pt is unable to participate in a thorough ROS due to mental status. She says she is "OK". Other than that, can not answer questions.  O: Poor appearing chronically ill WF in no acute distress. BP/HR as above. O2 sat normal on 2L. RR normal. No increased effort of breathing or respiratory distress. Afib on tele. Neuro: Awake. Disoriented. Stares blankly. PERRL. Can not do EOM exam because she will not follow commands. + left neglect noted. Left facial droop noted. Speech is slow and slurred. Expressive and receptive aphasia noted. Mostly non verbal except for one word answers. Will answer some questions and not others. Grip 5/5 on right, 4/5 on left. Can lift right arm up to chest, but can only lift left arm a little off the bed. Can lift up left leg, but not right. No spontaneous movement of LLE noted. Shoulder shrug 4/5. Responds to pain. Unable to assess sensation due to inability to follow commands and because of receptive aphasia. Skin is pale, warm and dry. CBG OK.  A/P: 1. Mental status change consistent with ? New onset stroke vs hemorrhage in a 76 yo lady with a hx of right parietal stroke in motor strip and chronic use of Eliquis for AFib. Per chart, she does have some residual left sided weakness and neglect, but per nurse, this has intensified. Was able to converse previously during this hospitalization and follow commands quickly per nurses who have taken care of her in past 2 days. This change was also confirmed by ICU nurse who took care of pt 2 nights ago. Per night nurse, pt's "last normal" was around lunch today per day RN. Do not know what action was taken at that time.  CODE STROKE was called  immediately. Pt made NPO. Protocol followed. Labs ordered. NP called Dr. Cheral Marker, neuro on call to discuss. Unfortunately, Dr. Cheral Marker, was also in a code stroke at [facility] and unable to come to [place]. Pt taken stat to CT. CT per Dr. Toney Reil without hemorrhage or active stroke. Old infarct seen and CT similar to CT performed 4 days ago. NP called neuro back. NP conveyed that this was certainly a change and neuro responded to send pt stat to [facility] for CTA to decide on procedure. Pt on Eliquis so not a candidate for TPA. Pt moved from CT suite to ICU at [facility] awaiting [facility]. All staff including RN, AC, and AC at [facility] aware of pt coming urgently for testing. Transfer orders, medical necessity and EMTALA completed.  After pt arrived to [facility], she was assessed by neuro. Dr. Cheral Marker called NP stating he cancelled the CTAs and thought this was not a new stroke, but just mental changes. NP wrote orders to send pt to tele for the cardizem drip to be continued.  2. AF with RVR, unresponsive to Metoprolol. Pt on Cardizem po. Cardizem bolus and drip started and po dose d/c'd for now.  3. Pt with hx Right parietal motor infarct. Stable.  4. Hx dementia-could be responsible for changes?  5. New onset/change in sx mimicking stroke. Ruled out by neuro.  Pt to stay at [facility] since there. Called report to accepting  MD, Dr. Hal Hope.  Critical care was called earlier about admitting to ICU, but not needed at this point.  Report given to my colleague at [facility] who will now take calls on pt. Pt's brother aware and update on results. KJKG, NP [facility]  Total critical care time: 120 minutes Critical care time was exclusive of separately billable procedures and treating other patients. Critical care was necessary to treat or prevent imminent or life-threatening deterioration. Critical care was time spent personally by me on the following activities: development of treatment plan with patient and/or surrogate as well as nursing,  discussions with consultants, evaluation of patient's response to treatment, examination of patient, obtaining history from patient or surrogate, ordering and performing treatments and interventions, ordering and review of laboratory studies, ordering and review of radiographic studies, pulse oximetry and re-evaluation of patient's condition.

## 2016-08-14 NOTE — Progress Notes (Signed)
Pt having increasing cough after liquids. MRI called to get pt, pt unable to tolerate laying flat without coughing. Pt unable to clear liquids from throat. Stated will notify MRI when able to tolerate. Cont to monitor. Carroll Kinds RN

## 2016-08-14 NOTE — Progress Notes (Signed)
Upon assessing pt this RN noticed a change in mental status in pt from previous night. Family at bedside also concerned about pt's mental status. Pt unable to follow commands, delayed and one worded responses  to questions, slurred speech, worsening left sided weakness, and neglecting left side. Pupils were equal,  round,  and responsive to light. Pt in a Fib RVR rate 140-50's. This RN asked Katie, RN  to also evaluate pt. VSS obtain and found hypertensive. Rapid response and Tylene Fantasia, NP  was notified and arrived to room. Orders obtained and followed by team. Pt taken to CT by this RN and rapid response team and then transferred to ICU room 1230. Report given to Saralyn Pilar, RN.

## 2016-08-14 NOTE — Progress Notes (Signed)
PT Cancellation Note  Patient Details Name: Tammy Beard MRN: [id number] DOB: December 05, 1940   Cancelled Treatment:    Reason Eval/Treat Not Completed: Medical issues which prohibited therapy Spoke with RN, and RN requesting to hold PT secondary to medical issues and elevated HR. Will follow up as pt becomes appropriate.   Nicky Pugh, PT, DPT  Acute Rehabilitation Services  Pager: [phone]    [name] 08/14/2016, 3:13 PM

## 2016-08-14 NOTE — Progress Notes (Signed)
Subjective: Patient currently is resting in bed very drowsy. Awakes easily with noxious stimuli. Oral mucosa is very dry however she is able to state that she is in the hospital but has difficulty with answering questions and is very slow to answer questions. She is able to follow commands however she looks very encephalopathic at present time. MRI and EEG have not been done yet.  Exam: Vitals:   08/14/16 0137 08/14/16 0726  BP: 126/82 133/89  Pulse:  69  Resp: 19 17  Temp:  98.8 F (37.1 C)    HEENT-  Normocephalic, no lesions, without obvious abnormality.  Normal external eye and conjunctiva.  Normal TM's bilaterally.  Normal auditory canals and external ears. Normal external nose, mucus membranes and septum.  Normal pharynx. Cardiovascular- irregularly irregular rhythm, pulses palpable throughout   Lungs- chest clear, no wheezing, rales, normal symmetric air entry, Heart exam - S1, S2 normal, no murmur, no gallop, rate regular Abdomen- soft, non-tender; bowel sounds normal; no masses,  no organomegaly Extremities- less then 2 second capillary refill    Neuro:  CN: Pupils are equal and round. They are symmetrically reactive from 3-->2 mm. EOMI without nystagmus. Facial sensation is intact to light touch. Face Slight left facial droop at rest with normal strength and mobility. Hearing is intact to conversational voice. Palate elevates symmetrically and uvula is midline--oral mucosa is very dry. Voice is dysarthric secondary to dry oral mucosa. Bilateral SCM and trapezii are 5/5. Tongue is midline with normal bulk and mobility.  Motor: Normal bulk, tone, and strength. Moving all extremities antigravity without drift Sensation: Intact to light touch. No neglect noted DTRs: 1+, symmetric  Toes downgoing bilaterally. No pathologic reflexes.  Coordination: Finger-to-nose however this did take Soma significant time and thinking before she was able to do it      Pertinent  Labs/Diagnostics: MRI and EEG have not been performed Potassium 2.8 Chloride 97 Alkaline phosphatase 175 Albumin 2.9 PTT 39  Etta Quill PA-C [facility] [phone]  Impression: Altered mental status mostly multifactorial with contributing factors including infection, prior strokes, delirium.  Awaiting MRI and EEG. She continues to be encephalopathic, but appears to be improving.   Recommendations: MRI brain, EEG Neurology will continue to follow  Roland Rack, MD [facility] [phone]  If 7pm- 7am, please page neurology on call as listed in [place].  08/14/2016, 9:50 AM

## 2016-08-14 NOTE — Care Management Note (Addendum)
Case Management Note  Patient Details  Name: Tammy Beard MRN: [id number] Date of Birth: [birth year]  Subjective/Objective:  Pt presented as a transfer from [place] for possible Stroke/Sepsis/ A fib. Pt continues on IV Cardizem gtt. Pt is from [facility]. Plan to return once stable.  CSW will continue to monitor for disposition needs.                   Action/Plan: CM will continue to monitor.   Expected Discharge Date:                  Expected Discharge Plan:  [place]  In-House Referral:  Clinical Social Work  Discharge planning Services  CM Consult  Post Acute Care Choice:  NA Choice offered to:  NA  DME Arranged:  N/A DME Agency:  NA  HH Arranged:  NA HH Agency:  NA  Status of Service:  Completed, signed off  If discussed at [employer] of Stay Meetings, dates discussed:  08-18-16, 08-21-16  Additional Comments: 1632 08-20-16 Jacqlyn Krauss, RN,BSN [phone] Pt continued on Cardizem gtt until 08-20-16 and changed to Cardizem po. Plan will be to return to [facility] once stable. CSW assisting with disposition needs.     1026 08-15-16 Jacqlyn Krauss, [place] [phone] Pt will benefit from Palliative Care Consult.  Bethena Roys, RN 08/14/2016, 4:03 PM

## 2016-08-14 NOTE — Procedures (Signed)
HPI:  76 y/o with MS change and L sided weakness  TECHNICAL SUMMARY:  A multichannel referential and bipolar montage EEG using the standard international 10-20 system was performed on the patient described as awake and drowsy.  The dominant background activity consists of 6-7 hertz activity seen most prominantly over the posterior head region.  The backgound activity is nonreactive to eye opening and closing procedures.  Low voltage fast (beta) activity is distributed symmetrically and maximally over the anterior head regions.  ACTIVATION:  Stepwise photic stimulation and hyperventilation are not performed  EPILEPTIFORM ACTIVITY:  There were no spikes, sharp waves or paroxysmal activity.  SLEEP:  Brief physiologic drowsiness is noted, but no stage II sleep.  CARDIAC:  The EKG lead revealed an irregular rhythm.    IMPRESSION:  This EEG demonstrated:  1.  Mild diffuse slowing of electrocerebral activity.  This can be seen in a wide variety of encephalopathic state including those of a toxic, metabolic, or degenerative nature.  2.  Triphasic sharp waveforms.  This is often due to metabolic etiologies.  Correlate clinically.  3.  No clearly epileptiform activity.  This does not exclude the diagnosis of a seizure disorder.  Correlate clinically.

## 2016-08-14 NOTE — Consult Note (Signed)
Patient   [facility] Inpatient Consult   08/14/2016  Tammy Beard 1940/06/22 [id number]  Patient was assessed for admission in the [facility] [place].  Patient is a resident at [facility] (SNF) according to the social workers notes.  Admitted with HCAP and HX of Stroke.  Current disposition is for the patient to return to skilled level at discharge.  No community [facility] Care Management needs noted.  For questions, please contact:  Natividad Brood, RN BSN [facility] Liaison  [phone] business mobile phone Toll free office [phone]

## 2016-08-14 NOTE — Progress Notes (Signed)
CRITICAL VALUE ALERT  Critical Value:  Potassium  Date & Time Notied:  08/14/2016 2025  Provider Notified: Opyd  Orders Received/Actions taken: Opyd ordered 40 mEq Potassium oral and 20 mEq potassium IV

## 2016-08-14 NOTE — Progress Notes (Signed)
CSW continuing to follow for return to [facility] when stable  Jorge Ny, LCSW Clinical Social Worker [phone]

## 2016-08-14 NOTE — Progress Notes (Signed)
Initial Nutrition Assessment  DOCUMENTATION CODES:   Severe malnutrition in context of chronic illness  INTERVENTION:   -RD will follow for diet advancement and supplement as appropriate  NUTRITION DIAGNOSIS:   Malnutrition (severe) related to chronic illness (CVA) as evidenced by percent weight loss, severe depletion of body fat, moderate depletions of muscle mass, severe depletion of muscle mass.  GOAL:   Patient will meet greater than or equal to 90% of their needs  MONITOR:   Diet advancement, Labs, Weight trends, Skin, I & O's  REASON FOR ASSESSMENT:   Low [place]    ASSESSMENT:   Tammy Beard is a 76 y.o. 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  The patient is hard of hearing at baseline with cognitive impairment.  Her brother reports that she was last known well two days ago; he visited her and she seemed behaving like her normal self.  By the next day, she was demonstrating increased confusion and told him "I don't feel well".  She has a new wet cough and tachycardia.  SNF personnel was concerned that she was in atrial fibrillation, so she was sent to the ED for further evaluation.  Pt admitted with mild sepsis secondary to pneumonia (aspiration vs HCAP).   KUB revealed ileus on 08/12/16. Pt transferred to [facility] on 08/13/16 secondary to AMS.   Pt HOH and very confused at time of visit. RD unable to obtain a lot of hx. Per discussion with EEG tech, EEG to to be performed due to AMS.   Pt currently NPO. SLP was following at [facility], however, was advanced to a dysphagia 3 diet with thin liquids with known risk for aspiration.   Pt of 104# verified on bedscale; pt stated  "that sounds about right". Wt hx reviewed, noted pt has experienced a 15% wt loss over the past 6 months, which is significant for time frame.   Nutrition-Focused physical exam completed. Findings are moderate to severe (clavicle) fat depletion, moderate to severe (dorsal hand, patella, thigh, and calf) muscle depletion, and no edema.   Labs reviewed: K: 2.8, CBGS: 92-110.   Diet Order:  Diet NPO time specified  Skin:  Wound (see comment) (stage II sacrum)  Last BM:  08/13/16  Height:   Ht Readings from Last 1 Encounters:  08/09/16 5\' 1"  (1.549 m)    Weight:   Wt Readings from Last 1 Encounters:  08/13/16 101 lb 1.6 oz (45.9 kg)    Ideal Body Weight:  47.7 kg  BMI:  Body mass index is 19.1 kg/m.  Estimated Nutritional Needs:   Kcal:  1400-1600  Protein:  65-80 grams  Fluid:  > 1.4 L  EDUCATION NEEDS:   No education needs identified at this time  Faustine Tates A. Jimmye Norman, RD, LDN, CDE Pager: [phone] After hours Pager: [phone]

## 2016-08-15 LAB — BASIC METABOLIC PANEL
Anion gap: 9 (ref 5–15)
BUN: 5 mg/dL — ABNORMAL LOW (ref 6–20)
CHLORIDE: 99 mmol/L — AB (ref 101–111)
CO2: 27 mmol/L (ref 22–32)
Calcium: 8.6 mg/dL — ABNORMAL LOW (ref 8.9–10.3)
Creatinine, Ser: 0.72 mg/dL (ref 0.44–1.00)
GFR calc non Af Amer: >60 mL/min (ref >60)
Glucose, Bld: 135 mg/dL — ABNORMAL HIGH (ref 65–99)
Potassium: 2.6 mmol/L — CL (ref 3.5–5.1)
SODIUM: 135 mmol/L (ref 135–145)

## 2016-08-15 LAB — GLUCOSE, CAPILLARY
GLUCOSE-CAPILLARY: 123 mg/dL — AB (ref 65–99)
GLUCOSE-CAPILLARY: 140 mg/dL — AB (ref 65–99)
GLUCOSE-CAPILLARY: 154 mg/dL — AB (ref 65–99)
GLUCOSE-CAPILLARY: 171 mg/dL — AB (ref 65–99)
Glucose-Capillary: 139 mg/dL — ABNORMAL HIGH (ref 65–99)
Glucose-Capillary: 132 mg/dL — ABNORMAL HIGH (ref 65–99)

## 2016-08-15 LAB — MAGNESIUM: MAGNESIUM: 2.1 mg/dL (ref 1.7–2.4)

## 2016-08-15 MED ORDER — POTASSIUM CHLORIDE 10 MEQ/100ML IV SOLN
10.0000 meq | Freq: Once | INTRAVENOUS | Status: AC
  Administered 2016-08-15: 10 meq via INTRAVENOUS
  Filled 2016-08-15: qty 100

## 2016-08-15 MED ORDER — POTASSIUM CHLORIDE CRYS ER 20 MEQ PO TBCR
40.0000 meq | EXTENDED_RELEASE_TABLET | Freq: Once | ORAL | Status: AC
  Administered 2016-08-15: 40 meq via ORAL
  Filled 2016-08-15: qty 2

## 2016-08-15 MED ORDER — POTASSIUM CHLORIDE 10 MEQ/100ML IV SOLN
10.0000 meq | INTRAVENOUS | Status: AC
Start: 2016-08-15 — End: 2016-08-15
  Administered 2016-08-15 (×3): 10 meq via INTRAVENOUS
  Filled 2016-08-15 (×3): qty 100

## 2016-08-15 MED ORDER — POTASSIUM CHLORIDE IN NACL 40-0.9 MEQ/L-% IV SOLN
INTRAVENOUS | Status: AC
  Administered 2016-08-15: 100 mL/h via INTRAVENOUS
  Filled 2016-08-15: qty 1000

## 2016-08-15 NOTE — Progress Notes (Signed)
Spoke with pt and brother about not taking liquids until after MRI. Educated that pt aspirating and would not allow pt to lie flat during test. Pt and brother ok with decision. Will cont to monitor. Carroll Kinds RN

## 2016-08-15 NOTE — Progress Notes (Signed)
[facility] PROGRESS NOTE    Progress Note  Tammy Beard  CHY:[id number] DOB: 05/11/1940 DOA: 08/08/2016 PCP: [name], MD     Brief Narrative:   Tammy Beard is an 76 y.o. female past medical history of CVA with left-sided weakness and neglect, paroxysmal atrial fibrillation CHADS-Vasc score at least 4, anticoagulated with Eliquis, recurrent UTIs suppression therapy, was sent from skilled nursing facility for confusion, cough and tachycardia, she was septic with a chest x-ray concerning for pneumonia.  Assessment/Plan:   Severe sepsis ([place]) due to aspiration PNA: Had a swallowing evaluation to severe dysphagia and high risk for aspiration the family is aware. DC Zosyn starting Unasyn. The likely cause of her sepsis with aspiration recurrent aspiration pneumonia. - discussed with speech therapy who states that there is nothing that they can offer to decrease risk of aspiration. Family aware and would like to continue feeds - Continue Unasyn  Acute metabolic encephalopathy: Resolved likely due to above.  Ileus: X-ray was compatible with ileus she was tolerating foods with no vomiting denied any abdominal pain. Advance diet and see  Paroxysmal atrial fibrillation: On Cardizem and metoprolol.  Continue apixiban  Essential hypertension elevated today, will monitor throughout the day. Add hydralazine IV when necessary.  Polymyalgia rheumatica (HCC) Continue current dose of prednisone.  DM (diabetes mellitus), type 2, uncontrolled w/neurologic complication (HCC) Continue sliding scale insulin.  Hypokalemia - replace orally and IV  History of stroke With left-sided hemiparesis, continue physical therapy.  Vascular dementia No hallucinations at this point or aggressiveness. Continued tobacco.  DVT prophylaxis: apixiban Family Communication:brother Disposition Plan/Barrier to D/C: unable to determine. Contact palliative next am. Code Status:       Code Status Orders        Start     Ordered   08/09/16 0400  Do not attempt resuscitation (DNR)  Continuous    Question Answer Comment  In the event of cardiac or respiratory ARREST Do not call a "code blue"   In the event of cardiac or respiratory ARREST Do not perform Intubation, CPR, defibrillation or ACLS   In the event of cardiac or respiratory ARREST Use medication by any route, position, wound care, and other measures to relive pain and suffering. May use oxygen, suction and manual treatment of airway obstruction as needed for comfort.      08/09/16 0401    Code Status History    Date Active Date Inactive Code Status Order ID Comments User Context   08/09/2016  1:54 AM 08/09/2016  4:01 AM DNR [id number]  Ward, Delice Bison, DO ED   03/23/2016 10:07 PM 03/27/2016  9:12 PM DNR [id number]  Vianne Bulls, MD ED   11/26/2015  4:29 PM 12/03/2015  7:32 PM DNR [id number]  Waldemar Dickens, MD Inpatient   09/21/2015  9:15 PM 09/26/2015  4:27 PM DNR [id number]  Ivor Costa, MD ED    Advance Directive Documentation     Most Recent Value  Type of Advance Directive  Healthcare Power of Attorney, Living will, Out of facility DNR (pink MOST or yellow form)  Pre-existing out of facility DNR order (yellow form or pink MOST form)  Yellow form placed in chart (order not valid for inpatient use)  "MOST" Form in Place?  -        IV Access:    Peripheral IV   Procedures and diagnostic studies:   Ct Head Wo Contrast  Result Date: 08/13/2016 CLINICAL DATA:  76 y/o F; aphasia  and left-sided symptoms with history of stroke. Interval mental status change. EXAM: CT HEAD WITHOUT CONTRAST TECHNIQUE: Contiguous axial images were obtained from the base of the skull through the vertex without intravenous contrast. COMPARISON:  08/09/2016 CT of the head. FINDINGS: Brain: No large acute territory infarct or intracranial hemorrhage identified. Chronic right frontal parietal region infarction is unchanged from prior CT  of head. Background of mild to moderate chronic microvascular ischemic changes in white matter in parenchymal volume loss. 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 Vascular: Mild calcific atherosclerosis of carotid siphons. No hyperdense vessel identified. Skull: Normal. Negative for fracture or focal lesion. Sinuses/Orbits: Partial bilateral mastoid opacification. Visualized paranasal sinuses are clear. Underpneumatized frontal sinuses. Bilateral intra-ocular lens replacement. Other: None. IMPRESSION: 1. No acute intracranial abnormality identified. 2. Stable appearance of right frontal parietal region chronic infarction, moderate chronic microvascular ischemic changes of white matter, and parenchymal volume loss of the brain. These results were called by telephone at the time of interpretation on 08/13/2016 at 8:41 pm to Dr. Clance Boll , who verbally acknowledged these results. Electronically Signed   By: Kristine Garbe M.D.   On: 08/13/2016 20:44     Medical Consultants:    None.  Anti-Infectives:   Unasyn  Subjective:    Tammy Beard Pt has no new complaints.  Objective:    Vitals:   08/15/16 0026 08/15/16 0404 08/15/16 1202 08/15/16 1615  BP: (!) 155/79 (!) 162/86 (!) 164/61 (!) 162/65  Pulse: (!) 137 79 78 68  Resp:  14 (!) 22 14  Temp: 98 F (36.7 C) 98.2 F (36.8 C) 97.7 F (36.5 C) 97.5 F (36.4 C)  TempSrc: Oral Oral Oral Oral  SpO2: 93% 91% 96% 97%  Weight:      Height:        Intake/Output Summary (Last 24 hours) at 08/15/16 1837 Last data filed at 08/15/16 0600  Gross per 24 hour  Intake          3044.55 ml  Output                0 ml  Net          3044.55 ml   Filed Weights   08/09/16 0006 08/09/16 0624 08/13/16 2314  Weight: 54.4 kg (120 lb) 54.4 kg (119 lb 14.9 oz) 45.9 kg (101 lb 1.6 oz)    Exam: General exam: In no acute distress. Respiratory system: Good air movement and clear to  auscultation. Cardiovascular system:  good air movement clear to auscultation  Gastrointestinal system: Mildly distended positive bowel sounds nontender Central nervous system: alert and oriented Extremities: No edema Skin: no rashes    Data Reviewed:    Labs: Basic Metabolic Panel:  Recent Labs Lab 08/09/16 0746  08/11/16 0820  08/12/16 0458 08/12/16 1238 08/13/16 0534 08/13/16 2126 08/14/16 1919 08/15/16 0333  NA  --   < > 130*  --  134* 130* 134* 135 136 135  K  --   < > 2.8*  < > 2.6* 3.5 4.7 2.8* 2.4* 2.6*  CL  --   < > 89*  --  94* 92* 97* 97* 98* 99*  CO2  --   < > 28  --  29 28 23 26 26 27   GLUCOSE  --   < > 135*  --  108* 110* 130* 118* 123* 135*  BUN  --   < > 10  --  9 10 14 10 6  5*  CREATININE  --   < > 0.63  --  0.69  0.74 0.75 0.73 0.67 0.68 0.72  CALCIUM  --   < > 8.7*  --  8.8* 8.6* 9.5 8.8* 8.5* 8.6*  MG 1.7  --  2.2  --  1.9  --   --   --   --  2.1  PHOS  --   --   --   --  2.8  --   --   --   --   --   < > = values in this interval not displayed. GFR Estimated Creatinine Clearance: 43.4 mL/min (by C-G formula based on SCr of 0.72 mg/dL). Liver Function Tests:  Recent Labs Lab 08/08/16 2224 08/13/16 2126  AST 26 28  ALT 14 49  ALKPHOS 159* 175*  BILITOT 0.4 0.6  PROT 7.6 6.6  ALBUMIN 3.8 2.9*   No results for input(s): LIPASE, AMYLASE in the last 168 hours. No results for input(s): AMMONIA in the last 168 hours. Coagulation profile  Recent Labs Lab 08/13/16 2126  INR 1.40    CBC:  Recent Labs Lab 08/08/16 2224 08/09/16 0405 08/10/16 0638 08/13/16 2126  WBC 11.0* 11.8* 5.2 9.1  NEUTROABS 8.9*  --   --  6.7  HGB 11.0* 8.9* 9.6* 9.1*  HCT 34.3* 27.5* 30.5* 29.4*  MCV 90.0 90.5 90.0 91.3  PLT 281 242 254 305   Cardiac Enzymes:  Recent Labs Lab 08/13/16 2126  TROPONINI 0.03*   BNP (last 3 results) No results for input(s): PROBNP in the last 8760 hours. CBG:  Recent Labs Lab 08/15/16 0016 08/15/16 0357  08/15/16 0750 08/15/16 1129 08/15/16 1706  GLUCAP 171* 140* 139* 132* 154*   D-Dimer: No results for input(s): DDIMER in the last 72 hours. Hgb A1c: No results for input(s): HGBA1C in the last 72 hours. Lipid Profile: No results for input(s): CHOL, HDL, LDLCALC, TRIG, CHOLHDL, LDLDIRECT in the last 72 hours. Thyroid function studies: No results for input(s): TSH, T4TOTAL, T3FREE, THYROIDAB in the last 72 hours.  Invalid input(s): FREET3 Anemia work up: No results for input(s): VITAMINB12, FOLATE, FERRITIN, TIBC, IRON, RETICCTPCT in the last 72 hours. Sepsis Labs:  Recent Labs Lab 08/08/16 2224 08/09/16 0043 08/09/16 0405 08/09/16 0408 08/10/16 4268 08/13/16 2126  PROCALCITON  --   --  <0.10  --   --   --   WBC 11.0*  --  11.8*  --  5.2 9.1  LATICACIDVEN  --  2.96*  --  2.7*  --   --    Microbiology Recent Results (from the past 240 hour(s))  Blood Culture (routine x 2)     Status: None   Collection Time: 08/09/16 12:55 AM  Result Value Ref Range Status   Specimen Description BLOOD RIGHT HAND  Final   Special Requests BOTTLES DRAWN AEROBIC AND ANAEROBIC BCAV  Final   Culture   Final    NO GROWTH 5 DAYS Performed at [facility], [place] [address] [place], [place] [address]    Report Status 08/14/2016 FINAL  Final  Blood Culture (routine x 2)     Status: None   Collection Time: 08/09/16 12:55 AM  Result Value Ref Range Status   Specimen Description BLOOD LEFT HAND  Final   Special Requests IN PEDIATRIC BOTTLE Blood Culture adequate volume  Final   Culture   Final    NO GROWTH 5 DAYS Performed at [facility], [place] [address] [place], [place]  [address]    Report Status 08/14/2016 FINAL  Final  Urine culture     Status: Abnormal   Collection Time: 08/09/16 12:55 AM  Result Value Ref Range Status   Specimen Description URINE, RANDOM  Final   Special Requests NONE  Final   Culture (A)  Final    >=100,000 COLONIES/mL LACTOBACILLUS SPECIES Standardized  susceptibility testing for this organism is not available. 70,000 COLONIES/mL YEAST    Report Status 08/10/2016 FINAL  Final  MRSA PCR Screening     Status: None   Collection Time: 08/09/16  3:36 AM  Result Value Ref Range Status   MRSA by PCR NEGATIVE NEGATIVE Final    Comment:        The GeneXpert MRSA Assay (FDA approved for NASAL specimens only), is one component of a comprehensive MRSA colonization surveillance program. It is not intended to diagnose MRSA infection nor to guide or monitor treatment for MRSA infections.   MRSA PCR Screening     Status: None   Collection Time: 08/13/16 11:12 PM  Result Value Ref Range Status   MRSA by PCR NEGATIVE NEGATIVE Final    Comment:        The GeneXpert MRSA Assay (FDA approved for NASAL specimens only), is one component of a comprehensive MRSA colonization surveillance program. It is not intended to diagnose MRSA infection nor to guide or monitor treatment for MRSA infections.      Medications:   . apixaban  5 mg Oral BID  . aspirin  81 mg Oral Daily  . insulin aspart  0-9 Units Subcutaneous Q4H  . metoprolol tartrate  50 mg Oral BID  . predniSONE  5 mg Oral Q breakfast   Continuous Infusions: . ampicillin-sulbactam (UNASYN) IV Stopped (08/15/16 1300)  . diltiazem (CARDIZEM) infusion 15 mg/hr (08/15/16 1329)    Time spent: 25 min   LOS: 6 days   Velvet Bathe  [facility] Pager [phone]  *Please refer to [email or web address], password [email or web address] to get updated schedule on who will round on this patient, as hospitalists switch teams weekly. If 7PM-7AM, please contact night-coverage at [email or web address], password [email or web address] for any overnight needs.  08/15/2016, 6:37 PM

## 2016-08-15 NOTE — Progress Notes (Signed)
CRITICAL VALUE ALERT  Critical Value:  Potassium 2.3   Date & Time Notied:  08/15/2016 0500  Provider Notified: Opyd  Orders Received/Actions taken: NS with 40 K ordered

## 2016-08-15 NOTE — Evaluation (Signed)
Physical Therapy Evaluation Patient Details Name: Tammy Beard MRN: [id number] DOB: 1940-12-15 Today's Date: 08/15/2016   History of Present Illness  76 y.o. female past medical history of CVA with left-sided weakness and neglect, paroxysmal atrial fibrillation CHADS-Vasc score at least 4, anticoagulated with Eliquis, recurrent UTIs suppression therapy, was sent from skilled nursing facility for confusion, cough and tachycardia, she was septic with a chest x-ray concerning for pneumonia.  Clinical Impression  Pt admitted with above diagnosis. Pt currently with functional limitations due to the deficits listed below (see PT Problem List). Pt is poor historian due to dementia, no family present. Pt did report she's nonambulatory, uses WC at SNF. Pt reported sacral/buttock pain with attempted sidelying to sit and that she can't tolerate sitting, so performed BLE exercises in bed only. Noted hypokalemia of 2.6 today, so no further activity attempted.  Pt will benefit from skilled PT to increase their independence and safety with mobility to allow discharge to the venue listed below.       Follow Up Recommendations SNF    Equipment Recommendations  Wheelchair cushion (measurements PT) (WC cushion (Rojo or similar) if she doesn't have one)    Recommendations for Other Services OT consult     Precautions / Restrictions Precautions Precautions: Fall Restrictions Weight Bearing Restrictions: No      Mobility  Bed Mobility               General bed mobility comments: deferred, pt reported sacral/buttocks pain with attempted sidelying to sit, unable to come to full sit  Transfers                    Ambulation/Gait                Stairs            Wheelchair Mobility    Modified Rankin (Stroke Patients Only)       Balance                                             Pertinent Vitals/Pain Faces Pain Scale: Hurts little more Pain  Location: sacral area with supine or sitting (unable to visualize sacrum due to dressing) Pain Descriptors / Indicators: Sore Pain Intervention(s): Limited activity within patient's tolerance;Monitored during session;Repositioned    Home Living Family/patient expects to be discharged to:: Skilled nursing facility                 Additional Comments: pt unable to provide detailed hx due to dementia, from SNF per chart    Prior Function Level of Independence: Needs assistance   Gait / Transfers Assistance Needed: pt poor historian due to dementia, stated she doesn't walk, uses WC, didn't know if she requires assist for WC transfers  ADL's / Homemaking Assistance Needed: needs assistance for bathing and dressing -per OT note from January 2018        Hand Dominance        Extremity/Trunk Assessment        Lower Extremity Assessment Lower Extremity Assessment: RLE deficits/detail;LLE deficits/detail RLE Deficits / Details: ankle DF PROM to ~0*, knee ext -5* AAROM, pt able to assist with heel slides BLEs so has some active hip flexion RLE: Unable to fully assess due to pain LLE Deficits / Details: ankle DF PROM to ~0*, knee ext -5* AAROM, pt able  to assist with heel slides BLEs so has some active hip flexion LLE: Unable to fully assess due to pain       Communication   Communication: HOH  Cognition Arousal/Alertness: Awake/alert Behavior During Therapy: WFL for tasks assessed/performed Overall Cognitive Status: No family/caregiver present to determine baseline cognitive functioning                                 General Comments: oriented to self only, not to location/situation, nor to month/year; able to follow 1 step commands      General Comments      Exercises General Exercises - Lower Extremity Ankle Circles/Pumps: PROM;Both;5 reps;Sidelying Heel Slides: AAROM;Both;5 reps;Sidelying   Assessment/Plan    PT Assessment Patient needs continued  PT services  PT Problem List Decreased strength;Decreased activity tolerance;Pain;Decreased cognition;Decreased mobility       PT Treatment Interventions Functional mobility training;Therapeutic activities;Therapeutic exercise;Balance training;Patient/family education    PT Goals (Current goals can be found in the Care Plan section)  Acute Rehab PT Goals PT Goal Formulation: Patient unable to participate in goal setting Time For Goal Achievement: 08/29/16    Frequency Min 3X/week   Barriers to discharge        Co-evaluation               AM-PAC PT "6 Clicks" Daily Activity  Outcome Measure Difficulty turning over in bed (including adjusting bedclothes, sheets and blankets)?: Total Difficulty moving from lying on back to sitting on the side of the bed? : Total Difficulty sitting down on and standing up from a chair with arms (e.g., wheelchair, bedside commode, etc,.)?: Total Help needed moving to and from a bed to chair (including a wheelchair)?: Total Help needed walking in hospital room?: Total Help needed climbing 3-5 steps with a railing? : Total 6 Click Score: 6    End of Session   Activity Tolerance: Patient limited by pain Patient left: in bed;with bed alarm set;with call bell/phone within reach Nurse Communication: Mobility status PT Visit Diagnosis: Muscle weakness (generalized) (M62.81);Pain Pain - part of body:  (sacral area in supine or sitting)    Time: 9458-5929 PT Time Calculation (min) (ACUTE ONLY): 10 min   Charges:   PT Evaluation $PT Eval Moderate Complexity: 1 Procedure     PT G Codes:        Philomena Doheny 08/15/2016, 9:36 AM [phone]

## 2016-08-15 NOTE — Progress Notes (Signed)
Subjective: Patient appears to be improved. She at least more interactive with me today.  Exam: Vitals:   08/15/16 1202 08/15/16 1615  BP: (!) 164/61 (!) 162/65  Pulse: 78 68  Resp: (!) 22 14  Temp: 97.7 F (36.5 C) 97.5 F (36.4 C)   Gen: In bed, NAD Resp: non-labored breathing, no acute distress Abd: soft, nt  Neuro: MS: Awake, alert, knows that she is at [facility], and oriented to year.  CN: Extraocular movements intact, visual fields full MotorShe moves all extremity as well Sensory: Intact to light touch  Pertinent Labs: Hypokalemia  Impression:   76 year old female with encephalopathy that I suspect was metabolic due to her aspiration pneumonia.  Recommendations:  MRI brain pending, if this is negative and neurology will sign off.   Roland Rack, MD [facility] [phone]  If 7pm- 7am, please page neurology on call as listed in [place].

## 2016-08-15 NOTE — Plan of Care (Signed)
Problem: Physical Regulation: Goal: Ability to maintain clinical measurements within normal limits will improve Outcome: Not Progressing Pt unable to ambulate at present

## 2016-08-16 ENCOUNTER — Inpatient Hospital Stay (HOSPITAL_COMMUNITY): Payer: Medicare Other

## 2016-08-16 ENCOUNTER — Encounter (HOSPITAL_COMMUNITY): Payer: Self-pay

## 2016-08-16 DIAGNOSIS — I361 Nonrheumatic tricuspid (valve) insufficiency: Secondary | ICD-10-CM

## 2016-08-16 LAB — GLUCOSE, CAPILLARY
GLUCOSE-CAPILLARY: 115 mg/dL — AB (ref 65–99)
GLUCOSE-CAPILLARY: 130 mg/dL — AB (ref 65–99)
Glucose-Capillary: 103 mg/dL — ABNORMAL HIGH (ref 65–99)
Glucose-Capillary: 133 mg/dL — ABNORMAL HIGH (ref 65–99)
Glucose-Capillary: 94 mg/dL (ref 65–99)
Glucose-Capillary: 98 mg/dL (ref 65–99)

## 2016-08-16 LAB — BASIC METABOLIC PANEL
Anion gap: 12 (ref 5–15)
BUN: 5 mg/dL — AB (ref 6–20)
CALCIUM: 8.9 mg/dL (ref 8.9–10.3)
CO2: 24 mmol/L (ref 22–32)
CREATININE: 0.78 mg/dL (ref 0.44–1.00)
Chloride: 101 mmol/L (ref 101–111)
GFR calc non Af Amer: >60 mL/min (ref >60)
GLUCOSE: 103 mg/dL — AB (ref 65–99)
Potassium: 3.7 mmol/L (ref 3.5–5.1)
Sodium: 137 mmol/L (ref 135–145)

## 2016-08-16 LAB — MAGNESIUM: Magnesium: 1.9 mg/dL (ref 1.7–2.4)

## 2016-08-16 LAB — ECHOCARDIOGRAM COMPLETE
Height: 61 in
Weight: 1825.6 oz

## 2016-08-16 MED ORDER — ORAL CARE MOUTH RINSE
15.0000 mL | Freq: Two times a day (BID) | OROMUCOSAL | Status: DC
  Administered 2016-08-19 – 2016-08-20 (×4): 15 mL via OROMUCOSAL

## 2016-08-16 MED ORDER — IOPAMIDOL (ISOVUE-370) INJECTION 76%
INTRAVENOUS | Status: AC
  Administered 2016-08-16: 50 mL
  Filled 2016-08-16: qty 50

## 2016-08-16 MED ORDER — CHLORHEXIDINE GLUCONATE 0.12 % MT SOLN
15.0000 mL | Freq: Two times a day (BID) | OROMUCOSAL | Status: DC
  Administered 2016-08-16 – 2016-08-21 (×10): 15 mL via OROMUCOSAL
  Filled 2016-08-16 (×8): qty 15

## 2016-08-16 MED ORDER — STROKE: EARLY STAGES OF RECOVERY BOOK
Freq: Once | Status: DC
  Filled 2016-08-16 (×2): qty 1

## 2016-08-16 NOTE — Progress Notes (Signed)
SLP Cancellation Note  Patient Details Name: Tammy Beard MRN: [id number] DOB: 21-Nov-1940   Cancelled treatment:       Reason Eval/Treat Not Completed: Other (comment) SLP received orders for cognitive-linguistic evaluation, no orders for swallow. Swallow evaluation was completed on 5/19 with pt at a high aspiration risk with all consistencies, also with concern for high risk for aspiration related infection. SLP signed off on 5/21 when MD initiated diet as pt and family were accepting of the risks of aspiration. Discussed with MD, RN; diet recommendation per MD. Should pt/family wishes change, please re-consult SLP. Will follow up for cognitive-linguistic evaluation.  Deneise Lever, [place], [place] Speech-Language Pathologist [phone]   Aliene Altes 08/16/2016, 3:01 PM

## 2016-08-16 NOTE — Progress Notes (Signed)
Pt currently in sinus rhythm with PVC's. Appears she has been in and out of sinus most of yesterday. Obtained EKG and notified Opyd.

## 2016-08-16 NOTE — Progress Notes (Signed)
[facility] PROGRESS NOTE    Progress Note  Tammy Beard  GXQ:[id number] DOB: 1940-12-16 DOA: 08/08/2016 PCP: [name], MD     Brief Narrative:   Tammy Beard is an 76 y.o. female past medical history of CVA with left-sided weakness and neglect, paroxysmal atrial fibrillation CHADS-Vasc score at least 4, anticoagulated with Eliquis, recurrent UTIs suppression therapy, was sent from skilled nursing facility for confusion, cough and tachycardia, she was septic with a chest x-ray concerning for pneumonia.  Assessment/Plan:   Severe sepsis ([place]) due to aspiration PNA: Had a swallowing evaluation to severe dysphagia and high risk for aspiration the family is aware. DC Zosyn starting Unasyn. The likely cause of her sepsis with aspiration recurrent aspiration pneumonia. - discussed with speech therapy who states that there is nothing that they can offer to decrease risk of aspiration. Family aware and would like to continue feeds - Continue Unasyn - Palliative consult next am.  Acute metabolic encephalopathy: Resolved likely due to above.  Ileus: X-ray was compatible with ileus she was tolerating foods with no vomiting denied any abdominal pain. Advance diet and see  Paroxysmal atrial fibrillation: On Cardizem and metoprolol.  Continue apixiban  Essential hypertension elevated today, will monitor throughout the day. Add hydralazine IV when necessary.  Polymyalgia rheumatica (HCC) Continue current dose of prednisone.  DM (diabetes mellitus), type 2, uncontrolled w/neurologic complication (HCC) Continue sliding scale insulin.  Hypokalemia - replace orally and IV  History of stroke With left-sided hemiparesis, continue physical therapy.  Vascular dementia No hallucinations at this point or aggressiveness. Continued tobacco.  DVT prophylaxis: apixiban Family Communication:brother Disposition Plan/Barrier to D/C: With improvement in condition will  transition back to facility Code Status:     Code Status Orders        Start     Ordered   08/09/16 0400  Do not attempt resuscitation (DNR)  Continuous    Question Answer Comment  In the event of cardiac or respiratory ARREST Do not call a "code blue"   In the event of cardiac or respiratory ARREST Do not perform Intubation, CPR, defibrillation or ACLS   In the event of cardiac or respiratory ARREST Use medication by any route, position, wound care, and other measures to relive pain and suffering. May use oxygen, suction and manual treatment of airway obstruction as needed for comfort.      08/09/16 0401    Code Status History    Date Active Date Inactive Code Status Order ID Comments User Context   08/09/2016  1:54 AM 08/09/2016  4:01 AM DNR [id number]  Ward, Delice Bison, DO ED   03/23/2016 10:07 PM 03/27/2016  9:12 PM DNR [id number]  Vianne Bulls, MD ED   11/26/2015  4:29 PM 12/03/2015  7:32 PM DNR [id number]  Waldemar Dickens, MD Inpatient   09/21/2015  9:15 PM 09/26/2015  4:27 PM DNR [id number]  Ivor Costa, MD ED    Advance Directive Documentation     Most Recent Value  Type of Advance Directive  Healthcare Power of Attorney, Living will, Out of facility DNR (pink MOST or yellow form)  Pre-existing out of facility DNR order (yellow form or pink MOST form)  Yellow form placed in chart (order not valid for inpatient use)  "MOST" Form in Place?  -        IV Access:    Peripheral IV   Procedures and diagnostic studies:   Ct Angio Head W/cm &/or Wo Cm  Result  Date: 08/16/2016 CLINICAL DATA:  History of CVA with LEFT-sided weakness. Atrial fibrillation. Diabetes. EXAM: CT ANGIOGRAPHY HEAD AND NECK TECHNIQUE: Multidetector CT imaging of the head and neck was performed using the standard protocol during bolus administration of intravenous contrast. Multiplanar CT image reconstructions and MIPs were obtained to evaluate the vascular anatomy. Carotid stenosis measurements (when applicable)  are obtained utilizing NASCET criteria, using the distal internal carotid diameter as the denominator. CONTRAST:  50 mL Isovue 370. COMPARISON:  MR brain 08/16/2016. CT head 08/13/2016. CTA head neck 09/24/2015. FINDINGS: CT HEAD Calvarium and skull base: No fracture or destructive lesion. Mastoids and middle ears are grossly clear. Paranasal sinuses: Imaged portions are clear. Orbits: Negative. Brain: No evidence of acute abnormality, including acute infarct, hemorrhage, hydrocephalus, or mass lesion. Atrophy. Chronic microvascular ischemic change. Subacute to chronic RIGHT parietal infarct, suspected watershed distribution. CTA NECK Aortic arch: Standard branching. Imaged portion shows no evidence of aneurysm or dissection. No significant stenosis of the major arch vessel origins. Right carotid system: Minor atheromatous change the bifurcation. No evidence of dissection, stenosis (50% or greater) or occlusion. Left carotid system: Minor atheromatous change at the bifurcation. No evidence of dissection, stenosis (50% or greater) or occlusion. Vertebral arteries: Codominant. No evidence of dissection, stenosis (50% or greater) or occlusion. Nonvascular soft tissues: Biapical lung opacities, greater on the LEFT, with moderate-sized effusions. Pneumonia not excluded. No neck masses. No adenopathy. Spondylosis. CTA HEAD Anterior circulation: Severe calcified LEFT ICA terminus stenosis. Moderate LEFT ICA cavernous stenosis. High-grade RIGHT ICA genu stenosis. LEFT M1 MCA is moderately irregular but patent. LEFT M1 stenosis in its midportion estimated 50%. Patent LEFT MCA branches. Severe distal RIGHT M1 MCA stenosis, estimated 75% or greater. No M2 MCA branch occlusion. No aneurysm, or vascular malformation. Posterior circulation: Unremarkable. No significant stenosis, proximal occlusion, aneurysm, or vascular malformation. Venous sinuses: As permitted by contrast timing, patent. Anatomic variants: None of significance.  Delayed phase:   No abnormal intracranial enhancement. IMPRESSION: No extracranial stenosis of significance. 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 Subacute to chronic RIGHT parietal cortical and subcortical infarct, possible watershed insult. Electronically Signed   By: Staci Righter M.D.   On: 08/16/2016 15:48   Ct Angio Neck W/cm &/or Wo/cm  Result Date: 08/16/2016 CLINICAL DATA:  History of CVA with LEFT-sided weakness. Atrial fibrillation. Diabetes. EXAM: CT ANGIOGRAPHY HEAD AND NECK TECHNIQUE: Multidetector CT imaging of the head and neck was performed using the standard protocol during bolus administration of intravenous contrast. Multiplanar CT image reconstructions and MIPs were obtained to evaluate the vascular anatomy. Carotid stenosis measurements (when applicable) are obtained utilizing NASCET criteria, using the distal internal carotid diameter as the denominator. CONTRAST:  50 mL Isovue 370. COMPARISON:  MR brain 08/16/2016. CT head 08/13/2016. CTA head neck 09/24/2015. FINDINGS: CT HEAD Calvarium and skull base: No fracture or destructive lesion. Mastoids and middle ears are grossly clear. Paranasal sinuses: Imaged portions are clear. Orbits: Negative. Brain: No evidence of acute abnormality, including acute infarct, hemorrhage, hydrocephalus, or mass lesion. Atrophy. Chronic microvascular ischemic change. Subacute to chronic RIGHT parietal infarct, suspected watershed distribution. CTA NECK Aortic arch: Standard branching. Imaged portion shows no evidence of aneurysm or dissection. No significant stenosis of the major arch vessel origins. Right carotid system: Minor atheromatous change the bifurcation. No evidence of dissection, stenosis (50% or greater) or occlusion. Left carotid system: Minor atheromatous change at the bifurcation. No evidence of dissection, stenosis (50% or  greater) or occlusion. Vertebral arteries:  Codominant. No evidence of dissection, stenosis (50% or greater) or occlusion. Nonvascular soft tissues: Biapical lung opacities, greater on the LEFT, with moderate-sized effusions. Pneumonia not excluded. No neck masses. No adenopathy. Spondylosis. CTA HEAD Anterior circulation: Severe calcified LEFT ICA terminus stenosis. Moderate LEFT ICA cavernous stenosis. High-grade RIGHT ICA genu stenosis. LEFT M1 MCA is moderately irregular but patent. LEFT M1 stenosis in its midportion estimated 50%. Patent LEFT MCA branches. Severe distal RIGHT M1 MCA stenosis, estimated 75% or greater. No M2 MCA branch occlusion. No aneurysm, or vascular malformation. Posterior circulation: Unremarkable. No significant stenosis, proximal occlusion, aneurysm, or vascular malformation. Venous sinuses: As permitted by contrast timing, patent. Anatomic variants: None of significance. Delayed phase:   No abnormal intracranial enhancement. IMPRESSION: No extracranial stenosis of significance. 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 Subacute to chronic RIGHT parietal cortical and subcortical infarct, possible watershed insult. Electronically Signed   By: Staci Righter M.D.   On: 08/16/2016 15:48   Mr Brain Wo Contrast  Result Date: 08/16/2016 CLINICAL DATA:  Altered mental status, possible stroke, LEFT-sided weakness. History of stroke, hypertension, polio, hyperlipidemia. EXAM: MRI HEAD WITHOUT CONTRAST TECHNIQUE: Multiplanar, multiecho pulse sequences of the brain and surrounding structures were obtained without intravenous contrast. COMPARISON:  CT HEAD Aug 13, 2016 and MRI of the head March 23, 2016 FINDINGS: Mild to moderately motion degraded examination. BRAIN: RIGHT occipital and LEFT temporal lobe periventricular foci of reduced diffusion measuring to 4 mm, not identifiable on ADC map. Faint reduced diffusion with normalized ADC values corresponding to RIGHT  parietal lobe infarct. Mild ex vacuo dilatation RIGHT lateral ventricle, no hydrocephalus. Moderate to severe ventriculomegaly on the basis of global parenchymal brain volume loss, progressed from prior imaging. Mega cisterna magna. Severe vermian atrophy. Patchy to confluent supratentorial and pontine white matter FLAIR T2 hyperintensities. No midline shift, mass effect or masses. No abnormal extra-axial fluid collections. VASCULAR: Normal major intracranial vascular flow voids present at skull base. SKULL AND UPPER CERVICAL SPINE: Partially empty sella. No suspicious calvarial bone marrow signal. Craniocervical junction maintained. SINUSES/ORBITS: Bilateral mastoid effusions. Paranasal sinuses are well-aerated. The included ocular globes and orbital contents are non-suspicious. Status post bilateral ocular lens implants. OTHER: None. IMPRESSION: Mildly motion degraded examination. Tiny RIGHT occipital and LEFT temporal lobe acute versus subacute infarcts. Old RIGHT parietal/ MCA territory infarct. Severe vermian atrophy in a background of moderate to severe parenchymal brain volume loss. Moderate chronic small vessel ischemic disease. Electronically Signed   By: Elon Alas M.D.   On: 08/16/2016 06:54     Medical Consultants:    None.  Anti-Infectives:   Unasyn  Subjective:    Deetta Perla Pt has no new complaints.  Objective:    Vitals:   08/16/16 1103 08/16/16 1334 08/16/16 1337 08/16/16 1513  BP: 129/73  (!) 148/104 (!) 150/83  Pulse: (!) 130     Resp: 17  18 19   Temp:  97.9 F (36.6 C)    TempSrc:  Oral    SpO2: (!) 67%  93%   Weight:      Height:        Intake/Output Summary (Last 24 hours) at 08/16/16 1740 Last data filed at 08/16/16 0500  Gross per 24 hour  Intake              645 ml  Output  0 ml  Net              645 ml   Filed Weights   08/09/16 0624 08/13/16 2314 08/16/16 0403  Weight: 54.4 kg (119 lb 14.9 oz) 45.9 kg (101 lb 1.6 oz)  51.8 kg (114 lb 1.6 oz)    Exam: General exam: In no acute distress. Respiratory system: Good air movement and clear to auscultation. Cardiovascular system:  good air movement clear to auscultation  Gastrointestinal system: Mildly distended positive bowel sounds nontender Central nervous system: alert and oriented Extremities: No edema Skin: no rashes    Data Reviewed:    Labs: Basic Metabolic Panel:  Recent Labs Lab 08/11/16 0820  08/12/16 0458  08/13/16 0534 08/13/16 2126 08/14/16 1919 08/15/16 0333 08/16/16 0304  NA 130*  --  134*  < > 134* 135 136 135 137  K 2.8*  < > 2.6*  < > 4.7 2.8* 2.4* 2.6* 3.7  CL 89*  --  94*  < > 97* 97* 98* 99* 101  CO2 28  --  29  < > 23 26 26 27 24   GLUCOSE 135*  --  108*  < > 130* 118* 123* 135* 103*  BUN 10  --  9  < > 14 10 6  5* 5*  CREATININE 0.63  --  0.69  0.74  < > 0.73 0.67 0.68 0.72 0.78  CALCIUM 8.7*  --  8.8*  < > 9.5 8.8* 8.5* 8.6* 8.9  MG 2.2  --  1.9  --   --   --   --  2.1 1.9  PHOS  --   --  2.8  --   --   --   --   --   --   < > = values in this interval not displayed. GFR Estimated Creatinine Clearance: 45.1 mL/min (by C-G formula based on SCr of 0.78 mg/dL). Liver Function Tests:  Recent Labs Lab 08/13/16 2126  AST 28  ALT 49  ALKPHOS 175*  BILITOT 0.6  PROT 6.6  ALBUMIN 2.9*   No results for input(s): LIPASE, AMYLASE in the last 168 hours. No results for input(s): AMMONIA in the last 168 hours. Coagulation profile  Recent Labs Lab 08/13/16 2126  INR 1.40    CBC:  Recent Labs Lab 08/10/16 0638 08/13/16 2126  WBC 5.2 9.1  NEUTROABS  --  6.7  HGB 9.6* 9.1*  HCT 30.5* 29.4*  MCV 90.0 91.3  PLT 254 305   Cardiac Enzymes:  Recent Labs Lab 08/13/16 2126  TROPONINI 0.03*   BNP (last 3 results) No results for input(s): PROBNP in the last 8760 hours. CBG:  Recent Labs Lab 08/16/16 0013 08/16/16 0400 08/16/16 0804 08/16/16 1225 08/16/16 1624  GLUCAP 98 94 115* 133* 130*    D-Dimer: No results for input(s): DDIMER in the last 72 hours. Hgb A1c: No results for input(s): HGBA1C in the last 72 hours. Lipid Profile: No results for input(s): CHOL, HDL, LDLCALC, TRIG, CHOLHDL, LDLDIRECT in the last 72 hours. Thyroid function studies: No results for input(s): TSH, T4TOTAL, T3FREE, THYROIDAB in the last 72 hours.  Invalid input(s): FREET3 Anemia work up: No results for input(s): VITAMINB12, FOLATE, FERRITIN, TIBC, IRON, RETICCTPCT in the last 72 hours. Sepsis Labs:  Recent Labs Lab 08/10/16 0539 08/13/16 2126  WBC 5.2 9.1   Microbiology Recent Results (from the past 240 hour(s))  Blood Culture (routine x 2)     Status: None   Collection Time: 08/09/16  12:55 AM  Result Value Ref Range Status   Specimen Description BLOOD RIGHT HAND  Final   Special Requests BOTTLES DRAWN AEROBIC AND ANAEROBIC BCAV  Final   Culture   Final    NO GROWTH 5 DAYS Performed at [facility], [place] [address], [place], [place] [address]    Report Status 08/14/2016 FINAL  Final  Blood Culture (routine x 2)     Status: None   Collection Time: 08/09/16 12:55 AM  Result Value Ref Range Status   Specimen Description BLOOD LEFT HAND  Final   Special Requests IN PEDIATRIC BOTTLE Blood Culture adequate volume  Final   Culture   Final    NO GROWTH 5 DAYS Performed at [facility], [place] [address], [place] [address], [place] [address]    Report Status 08/14/2016 FINAL  Final  Urine culture     Status: Abnormal   Collection Time: 08/09/16 12:55 AM  Result Value Ref Range Status   Specimen Description URINE, RANDOM  Final   Special Requests NONE  Final   Culture (A)  Final    >=100,000 COLONIES/mL LACTOBACILLUS SPECIES Standardized susceptibility testing for this organism is not available. 70,000 COLONIES/mL YEAST    Report Status 08/10/2016 FINAL  Final  MRSA PCR Screening     Status: None   Collection Time: 08/09/16  3:36 AM  Result Value Ref Range Status   MRSA by PCR  NEGATIVE NEGATIVE Final    Comment:        The GeneXpert MRSA Assay (FDA approved for NASAL specimens only), is one component of a comprehensive MRSA colonization surveillance program. It is not intended to diagnose MRSA infection nor to guide or monitor treatment for MRSA infections.   MRSA PCR Screening     Status: None   Collection Time: 08/13/16 11:12 PM  Result Value Ref Range Status   MRSA by PCR NEGATIVE NEGATIVE Final    Comment:        The GeneXpert MRSA Assay (FDA approved for NASAL specimens only), is one component of a comprehensive MRSA colonization surveillance program. It is not intended to diagnose MRSA infection nor to guide or monitor treatment for MRSA infections.      Medications:   .  stroke: mapping our early stages of recovery book   Does not apply Once  . apixaban  5 mg Oral BID  . aspirin  81 mg Oral Daily  . insulin aspart  0-9 Units Subcutaneous Q4H  . metoprolol tartrate  50 mg Oral BID  . predniSONE  5 mg Oral Q breakfast   Continuous Infusions: . diltiazem (CARDIZEM) infusion 15 mg/hr (08/16/16 0939)    Time spent: 25 min   LOS: 7 days   Velvet Bathe  [facility] Pager [phone]  *Please refer to [email or web address], password [email or web address] to get updated schedule on who will round on this patient, as hospitalists switch teams weekly. If 7PM-7AM, please contact night-coverage at [email or web address], password [email or web address] for any overnight needs.  08/16/2016, 5:40 PM

## 2016-08-16 NOTE — Progress Notes (Signed)
  Echocardiogram 2D Echocardiogram has been performed.  Tammy Beard T Duayne Brideau 08/16/2016, 10:12 AM

## 2016-08-16 NOTE — Progress Notes (Signed)
STROKE TEAM PROGRESS NOTE   HISTORY OF PRESENT ILLNESS (per record) Tammy Beard is an 76 y.o. female who was initially admitted to [facility] on 5/19 for tachycardia and AMS. 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 She is anticoagulated with Eliquis. She has recurrent UTIs. Also with baseline cognitive impairment and hearing loss. She was LKN 2 days prior to admission with gradual onset afterwards of increased confusion and not feeling well. A new wet cough and tachycardia were noted. She was sent to the ED by staff at her SNF for further evaluation. Her rectal temp was 100.1 on arrival, with lactate of 2.96, Na of 131 and WBC of 11. UTI and PNA were diagnosed and she was started on antibiotics. PMHx also includes polymyalgia rheumatica. On Depakote 125 mg po qd which is not a standard regimen for seizures and may be for management of behavior or mood.   During her stay, an ileus was also diagnosed. On Wednesday, she was at her baseline at approximately noon. Afterwards, her mentation began to decline. A Code Stroke was eventually activated after the 7 PM shift change. No clear focal symptoms that were new could be determined, but nursing staff felt that her AMS could be secondary to a stroke. She was out of the tPA and thrombectomy time windows at the time the Code Stroke was called. A CT head was obtained, ruling out a hemorrhage. [facility] team felt that aphasia secondary to stroke was still likely, so she was transported to [facility] ED for evaluation by neurologist followed by possible CTA of head and neck with CTP study to stratify for possible endovascular intervention. Upon arrival, patient was awake but altered and was not able to consistently follow commands. A left facial droop was noted in addition to left sided weakness consistent with deficits from previous stroke. In the context of patient's inability to consistently follow commands, NIHSS was 15. Patient was  in a-fib with RVR and was hypertensive.   SUBJECTIVE (INTERVAL HISTORY) No events overnight, no family at bedside. Patient is alert but confused.   OBJECTIVE Temp:  [97.5 F (36.4 C)-98.6 F (37 C)] 97.9 F (36.6 C) (05/26 1334) Pulse Rate:  [71-130] 130 (05/26 1103) Cardiac Rhythm: Atrial fibrillation (05/26 1003) Resp:  [17-29] 19 (05/26 1513) BP: (129-180)/(73-104) 150/83 (05/26 1513) SpO2:  [67 %-95 %] 93 % (05/26 1337) FiO2 (%):  [28 %] 28 % (05/26 0714) Weight:  [51.8 kg (114 lb 1.6 oz)] 51.8 kg (114 lb 1.6 oz) (05/26 0403)  CBC:   Recent Labs Lab 08/10/16 0638 08/13/16 2126  WBC 5.2 9.1  NEUTROABS  --  6.7  HGB 9.6* 9.1*  HCT 30.5* 29.4*  MCV 90.0 91.3  PLT 254 161    Basic Metabolic Panel:  Recent Labs Lab 08/12/16 0458  08/15/16 0333 08/16/16 0304  NA 134*  < > 135 137  K 2.6*  < > 2.6* 3.7  CL 94*  < > 99* 101  CO2 29  < > 27 24  GLUCOSE 108*  < > 135* 103*  BUN 9  < > 5* 5*  CREATININE 0.69  0.74  < > 0.72 0.78  CALCIUM 8.8*  < > 8.6* 8.9  MG 1.9  --  2.1 1.9  PHOS 2.8  --   --   --   < > = values in this interval not displayed.  Lipid Panel:     Component Value Date/Time  CHOL 175 03/24/2016 0027   TRIG 154 (H) 03/24/2016 0027   TRIG 115 02/05/2006 1016   HDL 66 03/24/2016 0027   CHOLHDL 2.7 03/24/2016 0027   VLDL 31 03/24/2016 0027   LDLCALC 78 03/24/2016 0027   HgbA1c:  Lab Results  Component Value Date   HGBA1C 6.0 07/17/2016   Urine Drug Screen: No results found for: LABOPIA, COCAINSCRNUR, LABBENZ, AMPHETMU, THCU, LABBARB  Alcohol Level No results found for: [facility]  IMAGING  CT head without contrast 08/13/2016 1. No acute intracranial abnormality identified. 2. Stable appearance of right frontal parietal region chronic infarction, moderate chronic microvascular ischemic changes of white matter, and parenchymal volume loss of the brain.    Ct Angio Head W/cm &/or Wo Cm Ct Angio Neck W/cm &/or Wo/cm 08/16/2016 No  extracranial stenosis of significance.  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 Subacute to chronic RIGHT parietal cortical and subcortical infarct, possible watershed insult.    Mr Brain Wo Contrast 08/16/2016 Mildly motion degraded examination. Tiny RIGHT occipital and LEFT temporal lobe acute versus subacute infarcts. Old RIGHT parietal/ MCA territory infarct. Severe vermian atrophy in a background of moderate to severe parenchymal brain volume loss. Moderate chronic small vessel ischemic disease.   PHYSICAL EXAM  Physical exam: Exam: Gen: NAD, confused, agitated CV: Attempted but could not complete due to agitation Eyes: Conjunctivae clear without exudates or hemorrhage  Neuro: Detailed Neurologic Exam  Speech:    Speech is not aphasic or dysarthric, impaired comprehension  Cognition:    The patient is oriented to person only    recent and remote memory impaired;     language fluent;     Impaired attention, concentration, fund of knowledge Cranial Nerves:    The pupils are equal, round, and reactive to light. Will track across the room. Trigeminal sensation appears intact and the muscles of mastication are normal. Left facial droop. Impaired hearing. Voice is normal. Shoulder shrug is normal. The tongue has normal motion without fasciculations.   Coordination: No dysmetria noted  Motor Observation:  left arm action tremor Tone:    Normal muscle tone.    Strength:    Difficult strength exam due to agitation, confusion. Moves all extremities antigravity, left arm drift     Sensation: responds to pain in all extremities       ASSESSMENT/PLAN Ms. Tammy Beard is a 76 y.o. female with history of previous stroke, paroxysmal atrial fibrillation on Eliquis, polymyalgia rheumatic, baseline cognitive impairment, hearing loss, polio, hypertension, hyperlipidemia, and ulcerative colitis, with recent  pneumonia and urinary tract infection presenting with altered mental status, left-sided weakness and neglect. She did not receive IV t-PA due to late presentation.  Strokes:  Multiple infarcts - likely embolic secondary to atrial fibrillation.  Resultant  Confusion, agitation, left weakness, left facial droop  CT head - No acute intracranial abnormality identified.  MRI head - Tiny RIGHT occipital and LEFT temporal lobe acute versus subacute infarcts. Old RIGHT parietal/ MCA territory infarct.   MRA head - not performed  CTA H&N - BILATERAL high-grade ICA stenoses, and moderate to severe MCA stenoses  Carotid Doppler - CTA neck  2D Echo - EF 60-65%. No cardiac source of emboli identified.  LDL - pending  HgbA1c - pending  VTE prophylaxis - Eliquis Diet NPO time specified  aspirin 81 mg daily and Eliquis (apixaban) daily prior to admission, now on aspirin 81 mg daily and Eliquis (apixaban) daily.  Continue.  Patient counseled to be compliant with her antithrombotic medications  Ongoing aggressive stroke risk factor management  Therapy recommendations: Skilled nursing facility recommended. (SNF PTA)  Disposition: Pending  Hypertension  Stable  Permissive hypertension (OK if < 220/120) but gradually normalize in 5-7 days  Long-term BP goal normotensive  Hyperlipidemia  Home meds: No lipid lowering medications prior to admission  LDL pending, goal < 70  Add  Continue statin at discharge   Other Stroke Risk Factors  Advanced age  ETOH use, advised to drink no more than 1 drink per day  Hx stroke/TIA  Atrial fibrillation - on Eliquis  Other Active Problems  anemia  Hospital day # 7  Stroke team will sign off at this time  Personally examined patient and images, and have participated in and made any corrections needed to history, physical, neuro exam,assessment and plan as stated above.  I have personally obtained the history, evaluated lab date, reviewed  imaging studies and agree with radiology interpretations.    Sarina Ill, MD Stroke Neurology  To contact Stroke Continuity provider, please refer to [email or web address] After hours, contact General Neurology

## 2016-08-17 LAB — POTASSIUM: POTASSIUM: 4.1 mmol/L (ref 3.5–5.1)

## 2016-08-17 LAB — BASIC METABOLIC PANEL
Anion gap: 11 (ref 5–15)
BUN: 7 mg/dL (ref 6–20)
CHLORIDE: 96 mmol/L — AB (ref 101–111)
CO2: 28 mmol/L (ref 22–32)
CREATININE: 0.76 mg/dL (ref 0.44–1.00)
Calcium: 8.8 mg/dL — ABNORMAL LOW (ref 8.9–10.3)
GFR calc non Af Amer: >60 mL/min (ref >60)
Glucose, Bld: 117 mg/dL — ABNORMAL HIGH (ref 65–99)
POTASSIUM: 2.8 mmol/L — AB (ref 3.5–5.1)
SODIUM: 135 mmol/L (ref 135–145)

## 2016-08-17 LAB — GLUCOSE, CAPILLARY
GLUCOSE-CAPILLARY: 112 mg/dL — AB (ref 65–99)
GLUCOSE-CAPILLARY: 125 mg/dL — AB (ref 65–99)
GLUCOSE-CAPILLARY: 111 mg/dL — AB (ref 65–99)
Glucose-Capillary: 110 mg/dL — ABNORMAL HIGH (ref 65–99)
Glucose-Capillary: 112 mg/dL — ABNORMAL HIGH (ref 65–99)
Glucose-Capillary: 114 mg/dL — ABNORMAL HIGH (ref 65–99)

## 2016-08-17 LAB — LIPID PANEL
CHOL/HDL RATIO: 2.8 ratio
CHOLESTEROL: 164 mg/dL (ref 0–200)
HDL: 59 mg/dL (ref >40)
LDL CALC: 78 mg/dL (ref 0–99)
TRIGLYCERIDES: 135 mg/dL (ref <150)
VLDL: 27 mg/dL (ref 0–40)

## 2016-08-17 LAB — MAGNESIUM: MAGNESIUM: 1.8 mg/dL (ref 1.7–2.4)

## 2016-08-17 LAB — CBC AND DIFFERENTIAL: WBC: 6.5 10^3/mL

## 2016-08-17 MED ORDER — POTASSIUM CHLORIDE 10 MEQ/100ML IV SOLN
10.0000 meq | INTRAVENOUS | Status: AC
  Administered 2016-08-17 (×6): 10 meq via INTRAVENOUS
  Filled 2016-08-17 (×6): qty 100

## 2016-08-17 MED ORDER — POTASSIUM CHLORIDE 10 MEQ/100ML IV SOLN: 10.0000 meq | INTRAVENOUS | Status: DC

## 2016-08-17 MED ORDER — INSULIN ASPART 100 UNIT/ML ~~LOC~~ SOLN
0.0000 [IU] | SUBCUTANEOUS | Status: DC
  Administered 2016-08-18: 3 [IU] via SUBCUTANEOUS
  Administered 2016-08-18 (×2): 2 [IU] via SUBCUTANEOUS
  Administered 2016-08-19: 1 [IU] via SUBCUTANEOUS
  Administered 2016-08-19 (×2): 3 [IU] via SUBCUTANEOUS
  Administered 2016-08-19 – 2016-08-20 (×2): 1 [IU] via SUBCUTANEOUS
  Administered 2016-08-20: 3 [IU] via SUBCUTANEOUS
  Administered 2016-08-20 (×2): 2 [IU] via SUBCUTANEOUS
  Administered 2016-08-21: 1 [IU] via SUBCUTANEOUS
  Administered 2016-08-21: 3 [IU] via SUBCUTANEOUS
  Administered 2016-08-21: 1 [IU] via SUBCUTANEOUS

## 2016-08-17 NOTE — Progress Notes (Signed)
Dr Olevia Bowens notified of 8 beat run of V-tach previously reported to me by central telemetry at 05:56; also notified of low K of 2.8 from this morning's BMET.

## 2016-08-17 NOTE — Progress Notes (Signed)
[facility] PROGRESS NOTE    Progress Note  Tammy Beard  VFI:[id number] DOB: [birth year] DOA: 08/08/2016 PCP: [name], MD     Brief Narrative:   Tammy Beard is an 76 y.o. female past medical history of CVA with left-sided weakness and neglect, paroxysmal atrial fibrillation CHADS-Vasc score at least 4, anticoagulated with Eliquis, recurrent UTIs suppression therapy, was sent from skilled nursing facility for confusion, cough and tachycardia, she was septic with a chest x-ray concerning for pneumonia.  Assessment/Plan:   Severe sepsis ([place]) due to aspiration PNA: Had a swallowing evaluation to severe dysphagia and high risk for aspiration the family is aware. DC Zosyn starting Unasyn. The likely cause of her sepsis with aspiration recurrent aspiration pneumonia. - discussed with speech therapy who states that there is nothing that they can offer to decrease risk of aspiration. Family aware and would like to continue feeds will advance to full liquid diet. Patient has poor prognosis and I have discussed this with her POA who is her brother. - Continue Unasyn - Palliative consult next am.  Nonsustained V. Tach - Most likely due to hypokalemia will replace.  - Patient is on beta blocker  Acute metabolic encephalopathy: Resolved likely due to above.  Ileus: X-ray was compatible with ileus she was tolerating foods with no vomiting denied any abdominal pain. Advance diet and see  Paroxysmal atrial fibrillation: On Cardizem and metoprolol.  Continue apixiban  Essential hypertension elevated today, will monitor throughout the day. Add hydralazine IV when necessary.  Polymyalgia rheumatica (HCC) Continue current dose of prednisone.  DM (diabetes mellitus), type 2, uncontrolled w/neurologic complication (HCC) Continue sliding scale insulin.  Hypokalemia - replace IV  History of stroke With left-sided hemiparesis, continue physical therapy.  Vascular  dementia No hallucinations at this point or aggressiveness. Continued tobacco.  DVT prophylaxis: apixiban Family Communication:brother 2 days ago Disposition Plan/Barrier to D/C: With improvement in condition will transition back to facility Code Status:     Code Status Orders        Start     Ordered   08/09/16 0400  Do not attempt resuscitation (DNR)  Continuous    Question Answer Comment  In the event of cardiac or respiratory ARREST Do not call a "code blue"   In the event of cardiac or respiratory ARREST Do not perform Intubation, CPR, defibrillation or ACLS   In the event of cardiac or respiratory ARREST Use medication by any route, position, wound care, and other measures to relive pain and suffering. May use oxygen, suction and manual treatment of airway obstruction as needed for comfort.      08/09/16 0401    Code Status History    Date Active Date Inactive Code Status Order ID Comments User Context   08/09/2016  1:54 AM 08/09/2016  4:01 AM DNR [id number]  Ward, Delice Bison, DO ED   03/23/2016 10:07 PM 03/27/2016  9:12 PM DNR [id number]  Vianne Bulls, MD ED   11/26/2015  4:29 PM 12/03/2015  7:32 PM DNR [id number]  Waldemar Dickens, MD Inpatient   09/21/2015  9:15 PM 09/26/2015  4:27 PM DNR [id number]  Ivor Costa, MD ED    Advance Directive Documentation     Most Recent Value  Type of Advance Directive  Healthcare Power of Attorney, Living will, Out of facility DNR (pink MOST or yellow form)  Pre-existing out of facility DNR order (yellow form or pink MOST form)  Yellow form placed in chart (order not  valid for inpatient use)  "MOST" Form in Place?  -        IV Access:    Peripheral IV   Procedures and diagnostic studies:   Ct Angio Head W/cm &/or Wo Cm  Result Date: 08/16/2016 CLINICAL DATA:  History of CVA with LEFT-sided weakness. Atrial fibrillation. Diabetes. EXAM: CT ANGIOGRAPHY HEAD AND NECK TECHNIQUE: Multidetector CT imaging of the head and neck was performed  using the standard protocol during bolus administration of intravenous contrast. Multiplanar CT image reconstructions and MIPs were obtained to evaluate the vascular anatomy. Carotid stenosis measurements (when applicable) are obtained utilizing NASCET criteria, using the distal internal carotid diameter as the denominator. CONTRAST:  50 mL Isovue 370. COMPARISON:  MR brain 08/16/2016. CT head 08/13/2016. CTA head neck 09/24/2015. FINDINGS: CT HEAD Calvarium and skull base: No fracture or destructive lesion. Mastoids and middle ears are grossly clear. Paranasal sinuses: Imaged portions are clear. Orbits: Negative. Brain: No evidence of acute abnormality, including acute infarct, hemorrhage, hydrocephalus, or mass lesion. Atrophy. Chronic microvascular ischemic change. Subacute to chronic RIGHT parietal infarct, suspected watershed distribution. CTA NECK Aortic arch: Standard branching. Imaged portion shows no evidence of aneurysm or dissection. No significant stenosis of the major arch vessel origins. Right carotid system: Minor atheromatous change the bifurcation. No evidence of dissection, stenosis (50% or greater) or occlusion. Left carotid system: Minor atheromatous change at the bifurcation. No evidence of dissection, stenosis (50% or greater) or occlusion. Vertebral arteries: Codominant. No evidence of dissection, stenosis (50% or greater) or occlusion. Nonvascular soft tissues: Biapical lung opacities, greater on the LEFT, with moderate-sized effusions. Pneumonia not excluded. No neck masses. No adenopathy. Spondylosis. CTA HEAD Anterior circulation: Severe calcified LEFT ICA terminus stenosis. Moderate LEFT ICA cavernous stenosis. High-grade RIGHT ICA genu stenosis. LEFT M1 MCA is moderately irregular but patent. LEFT M1 stenosis in its midportion estimated 50%. Patent LEFT MCA branches. Severe distal RIGHT M1 MCA stenosis, estimated 75% or greater. No M2 MCA branch occlusion. No aneurysm, or vascular  malformation. Posterior circulation: Unremarkable. No significant stenosis, proximal occlusion, aneurysm, or vascular malformation. Venous sinuses: As permitted by contrast timing, patent. Anatomic variants: None of significance. Delayed phase:   No abnormal intracranial enhancement. IMPRESSION: No extracranial stenosis of significance. 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 Subacute to chronic RIGHT parietal cortical and subcortical infarct, possible watershed insult. Electronically Signed   By: Staci Righter M.D.   On: 08/16/2016 15:48   Ct Angio Neck W/cm &/or Wo/cm  Result Date: 08/16/2016 CLINICAL DATA:  History of CVA with LEFT-sided weakness. Atrial fibrillation. Diabetes. EXAM: CT ANGIOGRAPHY HEAD AND NECK TECHNIQUE: Multidetector CT imaging of the head and neck was performed using the standard protocol during bolus administration of intravenous contrast. Multiplanar CT image reconstructions and MIPs were obtained to evaluate the vascular anatomy. Carotid stenosis measurements (when applicable) are obtained utilizing NASCET criteria, using the distal internal carotid diameter as the denominator. CONTRAST:  50 mL Isovue 370. COMPARISON:  MR brain 08/16/2016. CT head 08/13/2016. CTA head neck 09/24/2015. FINDINGS: CT HEAD Calvarium and skull base: No fracture or destructive lesion. Mastoids and middle ears are grossly clear. Paranasal sinuses: Imaged portions are clear. Orbits: Negative. Brain: No evidence of acute abnormality, including acute infarct, hemorrhage, hydrocephalus, or mass lesion. Atrophy. Chronic microvascular ischemic change. Subacute to chronic RIGHT parietal infarct, suspected watershed distribution. CTA NECK Aortic arch: Standard branching. Imaged portion shows no evidence of aneurysm or dissection. No  significant stenosis of the major arch vessel origins. Right carotid system: Minor atheromatous change the  bifurcation. No evidence of dissection, stenosis (50% or greater) or occlusion. Left carotid system: Minor atheromatous change at the bifurcation. No evidence of dissection, stenosis (50% or greater) or occlusion. Vertebral arteries: Codominant. No evidence of dissection, stenosis (50% or greater) or occlusion. Nonvascular soft tissues: Biapical lung opacities, greater on the LEFT, with moderate-sized effusions. Pneumonia not excluded. No neck masses. No adenopathy. Spondylosis. CTA HEAD Anterior circulation: Severe calcified LEFT ICA terminus stenosis. Moderate LEFT ICA cavernous stenosis. High-grade RIGHT ICA genu stenosis. LEFT M1 MCA is moderately irregular but patent. LEFT M1 stenosis in its midportion estimated 50%. Patent LEFT MCA branches. Severe distal RIGHT M1 MCA stenosis, estimated 75% or greater. No M2 MCA branch occlusion. No aneurysm, or vascular malformation. Posterior circulation: Unremarkable. No significant stenosis, proximal occlusion, aneurysm, or vascular malformation. Venous sinuses: As permitted by contrast timing, patent. Anatomic variants: None of significance. Delayed phase:   No abnormal intracranial enhancement. IMPRESSION: No extracranial stenosis of significance. 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 Subacute to chronic RIGHT parietal cortical and subcortical infarct, possible watershed insult. Electronically Signed   By: Staci Righter M.D.   On: 08/16/2016 15:48   Mr Brain Wo Contrast  Result Date: 08/16/2016 CLINICAL DATA:  Altered mental status, possible stroke, LEFT-sided weakness. History of stroke, hypertension, polio, hyperlipidemia. EXAM: MRI HEAD WITHOUT CONTRAST TECHNIQUE: Multiplanar, multiecho pulse sequences of the brain and surrounding structures were obtained without intravenous contrast. COMPARISON:  CT HEAD Aug 13, 2016 and MRI of the head March 23, 2016 FINDINGS: Mild to moderately motion  degraded examination. BRAIN: RIGHT occipital and LEFT temporal lobe periventricular foci of reduced diffusion measuring to 4 mm, not identifiable on ADC map. Faint reduced diffusion with normalized ADC values corresponding to RIGHT parietal lobe infarct. Mild ex vacuo dilatation RIGHT lateral ventricle, no hydrocephalus. Moderate to severe ventriculomegaly on the basis of global parenchymal brain volume loss, progressed from prior imaging. Mega cisterna magna. Severe vermian atrophy. Patchy to confluent supratentorial and pontine white matter FLAIR T2 hyperintensities. No midline shift, mass effect or masses. No abnormal extra-axial fluid collections. VASCULAR: Normal major intracranial vascular flow voids present at skull base. SKULL AND UPPER CERVICAL SPINE: Partially empty sella. No suspicious calvarial bone marrow signal. Craniocervical junction maintained. SINUSES/ORBITS: Bilateral mastoid effusions. Paranasal sinuses are well-aerated. The included ocular globes and orbital contents are non-suspicious. Status post bilateral ocular lens implants. OTHER: None. IMPRESSION: Mildly motion degraded examination. Tiny RIGHT occipital and LEFT temporal lobe acute versus subacute infarcts. Old RIGHT parietal/ MCA territory infarct. Severe vermian atrophy in a background of moderate to severe parenchymal brain volume loss. Moderate chronic small vessel ischemic disease. Electronically Signed   By: Elon Alas M.D.   On: 08/16/2016 06:54     Medical Consultants:    None.  Anti-Infectives:   Unasyn  Subjective:    Tammy Beard Pt has no new complaints.  Objective:    Vitals:   08/17/16 0635 08/17/16 0704 08/17/16 0927 08/17/16 1124  BP: (!) 124/96 128/89 128/88 137/80  Pulse: (!) 57 90 (!) 109 (!) 122  Resp: (!) 26 18  (!) 21  Temp:  97.8 F (36.6 C)  98 F (36.7 C)  TempSrc:  Oral  Oral  SpO2: 96% 96%  96%  Weight:      Height:        Intake/Output Summary (  Last 24 hours) at  08/17/16 1554 Last data filed at 08/17/16 0730  Gross per 24 hour  Intake              515 ml  Output                0 ml  Net              515 ml   Filed Weights   08/13/16 2314 08/16/16 0403 08/17/16 0404  Weight: 45.9 kg (101 lb 1.6 oz) 51.8 kg (114 lb 1.6 oz) 47.4 kg (104 lb 8 oz)    Exam: General exam: In no acute distress. Respiratory system: Good air movement and clear to auscultation. Cardiovascular system:  good air movement clear to auscultation  Gastrointestinal system: Mildly distended positive bowel sounds nontender Central nervous system: alert and oriented Extremities: No edema Skin: no rashes    Data Reviewed:    Labs: Basic Metabolic Panel:  Recent Labs Lab 08/11/16 0820  08/12/16 0458  08/13/16 2126 08/14/16 1919 08/15/16 0333 08/16/16 0304 08/17/16 0351 08/17/16 0850  NA 130*  --  134*  < > 135 136 135 137 135  --   K 2.8*  < > 2.6*  < > 2.8* 2.4* 2.6* 3.7 2.8*  --   CL 89*  --  94*  < > 97* 98* 99* 101 96*  --   CO2 28  --  29  < > 26 26 27 24 28   --   GLUCOSE 135*  --  108*  < > 118* 123* 135* 103* 117*  --   BUN 10  --  9  < > 10 6 5* 5* 7  --   CREATININE 0.63  --  0.69  0.74  < > 0.67 0.68 0.72 0.78 0.76  --   CALCIUM 8.7*  --  8.8*  < > 8.8* 8.5* 8.6* 8.9 8.8*  --   MG 2.2  --  1.9  --   --   --  2.1 1.9  --  1.8  PHOS  --   --  2.8  --   --   --   --   --   --   --   < > = values in this interval not displayed. GFR Estimated Creatinine Clearance: 44.8 mL/min (by C-G formula based on SCr of 0.76 mg/dL). Liver Function Tests:  Recent Labs Lab 08/13/16 2126  AST 28  ALT 49  ALKPHOS 175*  BILITOT 0.6  PROT 6.6  ALBUMIN 2.9*   No results for input(s): LIPASE, AMYLASE in the last 168 hours. No results for input(s): AMMONIA in the last 168 hours. Coagulation profile  Recent Labs Lab 08/13/16 2126  INR 1.40    CBC:  Recent Labs Lab 08/13/16 2126  WBC 9.1  NEUTROABS 6.7  HGB 9.1*  HCT 29.4*  MCV 91.3  PLT 305    Cardiac Enzymes:  Recent Labs Lab 08/13/16 2126  TROPONINI 0.03*   BNP (last 3 results) No results for input(s): PROBNP in the last 8760 hours. CBG:  Recent Labs Lab 08/16/16 2125 08/17/16 0034 08/17/16 0408 08/17/16 0730 08/17/16 1124  GLUCAP 103* 112* 112* 110* 114*   D-Dimer: No results for input(s): DDIMER in the last 72 hours. Hgb A1c: No results for input(s): HGBA1C in the last 72 hours. Lipid Profile:  Recent Labs  08/17/16 0351  CHOL 164  HDL 59  LDLCALC 78  TRIG 135  CHOLHDL 2.8  Thyroid function studies: No results for input(s): TSH, T4TOTAL, T3FREE, THYROIDAB in the last 72 hours.  Invalid input(s): FREET3 Anemia work up: No results for input(s): VITAMINB12, FOLATE, FERRITIN, TIBC, IRON, RETICCTPCT in the last 72 hours. Sepsis Labs:  Recent Labs Lab 08/13/16 2126  WBC 9.1   Microbiology Recent Results (from the past 240 hour(s))  Blood Culture (routine x 2)     Status: None   Collection Time: 08/09/16 12:55 AM  Result Value Ref Range Status   Specimen Description BLOOD RIGHT HAND  Final   Special Requests BOTTLES DRAWN AEROBIC AND ANAEROBIC BCAV  Final   Culture   Final    NO GROWTH 5 DAYS Performed at [facility], [place] [address] [place], [place] [address]    Report Status 08/14/2016 FINAL  Final  Blood Culture (routine x 2)     Status: None   Collection Time: 08/09/16 12:55 AM  Result Value Ref Range Status   Specimen Description BLOOD LEFT HAND  Final   Special Requests IN PEDIATRIC BOTTLE Blood Culture adequate volume  Final   Culture   Final    NO GROWTH 5 DAYS Performed at [facility], [place] [address] [place], [place] [address]    Report Status 08/14/2016 FINAL  Final  Urine culture     Status: Abnormal   Collection Time: 08/09/16 12:55 AM  Result Value Ref Range Status   Specimen Description URINE, RANDOM  Final   Special Requests NONE  Final   Culture (A)  Final    >=100,000 COLONIES/mL LACTOBACILLUS  SPECIES Standardized susceptibility testing for this organism is not available. 70,000 COLONIES/mL YEAST    Report Status 08/10/2016 FINAL  Final  MRSA PCR Screening     Status: None   Collection Time: 08/09/16  3:36 AM  Result Value Ref Range Status   MRSA by PCR NEGATIVE NEGATIVE Final    Comment:        The GeneXpert MRSA Assay (FDA approved for NASAL specimens only), is one component of a comprehensive MRSA colonization surveillance program. It is not intended to diagnose MRSA infection nor to guide or monitor treatment for MRSA infections.   MRSA PCR Screening     Status: None   Collection Time: 08/13/16 11:12 PM  Result Value Ref Range Status   MRSA by PCR NEGATIVE NEGATIVE Final    Comment:        The GeneXpert MRSA Assay (FDA approved for NASAL specimens only), is one component of a comprehensive MRSA colonization surveillance program. It is not intended to diagnose MRSA infection nor to guide or monitor treatment for MRSA infections.      Medications:   .  stroke: mapping our early stages of recovery book   Does not apply Once  . apixaban  5 mg Oral BID  . aspirin  81 mg Oral Daily  . chlorhexidine  15 mL Mouth Rinse BID  . insulin aspart  0-9 Units Subcutaneous Q4H  . mouth rinse  15 mL Mouth Rinse q12n4p  . metoprolol tartrate  50 mg Oral BID  . predniSONE  5 mg Oral Q breakfast   Continuous Infusions: . diltiazem (CARDIZEM) infusion 15 mg/hr (08/17/16 1414)  . potassium chloride      Time spent: 25 min   LOS: 8 days   Velvet Bathe  [facility] Pager [phone]  *Please refer to [email or web address], password [email or web address] to get updated schedule on who will round on this patient, as hospitalists  switch teams weekly. If 7PM-7AM, please contact night-coverage at [email or web address], password [email or web address] for any overnight needs.  08/17/2016, 3:54 PM

## 2016-08-18 LAB — BASIC METABOLIC PANEL
ANION GAP: 14 (ref 5–15)
Anion gap: 13 (ref 5–15)
BUN: 9 mg/dL (ref 4–21)
BUN: 9 mg/dL (ref 6–20)
BUN: 9 mg/dL (ref 6–20)
CHLORIDE: 93 mmol/L — AB (ref 101–111)
CHLORIDE: 94 mmol/L — AB (ref 101–111)
CO2: 24 mmol/L (ref 22–32)
CO2: 25 mmol/L (ref 22–32)
Calcium: 8.4 mg/dL — ABNORMAL LOW (ref 8.9–10.3)
Calcium: 8.9 mg/dL (ref 8.9–10.3)
Creatinine, Ser: 0.67 mg/dL (ref 0.44–1.00)
Creatinine, Ser: 0.76 mg/dL (ref 0.44–1.00)
Creatinine: 0.8 mg/dL (ref 0.5–1.1)
GFR calc Af Amer: >60 mL/min (ref >60)
GFR calc non Af Amer: >60 mL/min (ref >60)
GFR calc non Af Amer: >60 mL/min (ref >60)
GLUCOSE: 102 mg/dL — AB (ref 65–99)
Glucose, Bld: 104 mg/dL — ABNORMAL HIGH (ref 65–99)
Glucose: 102 mg/dL
POTASSIUM: 2.9 mmol/L — AB (ref 3.5–5.1)
POTASSIUM: 2.9 mmol/L — AB (ref 3.5–5.1)
Potassium: 2.9 mmol/L — AB (ref 3.4–5.3)
SODIUM: 130 mmol/L — AB (ref 135–145)
Sodium: 133 mmol/L — AB (ref 137–147)
Sodium: 133 mmol/L — ABNORMAL LOW (ref 135–145)

## 2016-08-18 LAB — GLUCOSE, CAPILLARY
GLUCOSE-CAPILLARY: 168 mg/dL — AB (ref 65–99)
GLUCOSE-CAPILLARY: 154 mg/dL — AB (ref 65–99)
Glucose-Capillary: 103 mg/dL — ABNORMAL HIGH (ref 65–99)
Glucose-Capillary: 117 mg/dL — ABNORMAL HIGH (ref 65–99)
Glucose-Capillary: 219 mg/dL — ABNORMAL HIGH (ref 65–99)
Glucose-Capillary: 95 mg/dL (ref 65–99)

## 2016-08-18 LAB — CBC
HEMATOCRIT: 27.6 % — AB (ref 36.0–46.0)
Hemoglobin: 8.5 g/dL — ABNORMAL LOW (ref 12.0–15.0)
MCH: 27.6 pg (ref 26.0–34.0)
MCHC: 30.8 g/dL (ref 30.0–36.0)
MCV: 89.6 fL (ref 78.0–100.0)
Platelets: 301 10*3/uL (ref 150–400)
RBC: 3.08 MIL/uL — AB (ref 3.87–5.11)
RDW: 14.1 % (ref 11.5–15.5)
WBC: 6.5 10*3/uL (ref 4.0–10.5)

## 2016-08-18 LAB — MAGNESIUM: Magnesium: 1.8 mg/dL (ref 1.7–2.4)

## 2016-08-18 MED ORDER — PROMETHAZINE HCL 25 MG/ML IJ SOLN
12.5000 mg | Freq: Once | INTRAMUSCULAR | Status: AC
  Administered 2016-08-18: 12.5 mg via INTRAVENOUS
  Filled 2016-08-18: qty 1

## 2016-08-18 MED ORDER — POTASSIUM CHLORIDE CRYS ER 20 MEQ PO TBCR
40.0000 meq | EXTENDED_RELEASE_TABLET | Freq: Once | ORAL | Status: AC
  Administered 2016-08-18: 40 meq via ORAL
  Filled 2016-08-18: qty 2

## 2016-08-18 MED ORDER — POTASSIUM CHLORIDE 10 MEQ/100ML IV SOLN
10.0000 meq | INTRAVENOUS | Status: AC
  Administered 2016-08-18 (×3): 10 meq via INTRAVENOUS
  Filled 2016-08-18 (×3): qty 100

## 2016-08-18 MED ORDER — POTASSIUM CHLORIDE 10 MEQ/100ML IV SOLN
10.0000 meq | INTRAVENOUS | Status: AC
  Administered 2016-08-18 (×4): 10 meq via INTRAVENOUS
  Filled 2016-08-18 (×4): qty 100

## 2016-08-18 NOTE — Evaluation (Signed)
Occupational Therapy Evaluation and Discharge Patient Details Name: Tammy Beard MRN: [id number] DOB: 05-Sep-1940 Today's Date: 08/18/2016    History of Present Illness 76 y.o. female past medical history of CVA with left-sided weakness and neglect, paroxysmal atrial fibrillation CHADS-Vasc score at least 4, anticoagulated with Eliquis, recurrent UTIs suppression therapy, was sent from skilled nursing facility for confusion, cough and tachycardia, she was septic with a chest x-ray concerning for pneumonia.[facility] Kitchen MRI: Tiny RIGHT occipital and LEFT temporal lobe acute versus subacute infarcts. Old RIGHT parietal/ MCA territory infarct   Clinical Impression   This 76 yo female admitted with above presents to acute OT with deficits below; however this does not seem to change her basic level of dependent with basic ADLs pta. No further acute OT needs noted, we will D/C from acute OT and follow up OT at SNF as they deem appropriate.    Follow Up Recommendations  SNF;Supervision/Assistance - 24 hour    Equipment Recommendations  None recommended by OT       Precautions / Restrictions Precautions Precautions: Fall Restrictions Weight Bearing Restrictions: No      Mobility Bed Mobility Overal bed mobility: Needs Assistance Bed Mobility: Sit to Supine Rolling: Total assist Sidelying to sit: Total assist   Sit to supine: Total assist   General bed mobility comments: used bed pad to help scoot her to EOB while still in side lying then bed pad to pivot her around to a seated position at EOB         Balance Overall balance assessment: Needs assistance Sitting-balance support: Feet supported   Sitting balance - Comments: varied between single extremity support and no support as session progressed, even able to move her RUE to reach for my hand (midline and to right without LOB)                                   ADL either performed or assessed with clinical judgement    ADL Overall ADL's : Needs assistance/impaired Eating/Feeding: Total assistance;Bed level   Grooming: Wash/dry face;Set up;Supervision/safety;Bed level   Upper Body Bathing: Total assistance;Bed level   Lower Body Bathing: Total assistance;Bed level   Upper Body Dressing : Total assistance;Bed level   Lower Body Dressing: Total assistance;Bed level                       Vision Baseline Vision/History: Wears glasses Wears Glasses: At all times Patient Visual Report: No change from baseline              Pertinent Vitals/Pain Pain Assessment: No/denies pain     Hand Dominance Left   Extremity/Trunk Assessment Upper Extremity Assessment Upper Extremity Assessment: LUE deficits/detail RUE Deficits / Details: tremor LUE Deficits / Details: old CVA, PROM elbow to hand WNL with some spontaneous movments noted (ie: she tried to use it to put her glasses back on); able to open and close hand when asked too; tremor LUE Coordination: decreased gross motor;decreased fine motor           Communication Communication Communication: HOH (very; has Bil hearing aids)   Cognition Arousal/Alertness: Awake/alert Behavior During Therapy: WFL for tasks assessed/performed                                   General Comments: oriented to self only, not  to location/situation; able to follow 1 step commands with increased time   General Comments               Home Living Family/patient expects to be discharged to:: Skilled nursing facility                                        Prior Functioning/Environment Level of Independence: Needs assistance  Gait / Transfers Assistance Needed: Pt reports they lift her OOB to W/C but that she does not get up often ADL's / Homemaking Assistance Needed: Pt reports that they feed her (due to tremors in UEs), bath, dress, and toilet her at [facility]            OT Problem List: Decreased  strength;Decreased range of motion;Impaired balance (sitting and/or standing);Impaired UE functional use;Impaired vision/perception;Decreased cognition         OT Goals(Current goals can be found in the care plan section) Acute Rehab OT Goals Patient Stated Goal: to be able to get OOB (unable to do this with her this session due to not having +2 A)  OT Frequency:                AM-PAC PT "6 Clicks" Daily Activity     Outcome Measure Help from another person eating meals?: Total Help from another person taking care of personal grooming?: A Lot Help from another person toileting, which includes using toliet, bedpan, or urinal?: Total Help from another person bathing (including washing, rinsing, drying)?: Total Help from another person to put on and taking off regular upper body clothing?: Total Help from another person to put on and taking off regular lower body clothing?: Total 6 Click Score: 7   End of Session Nurse Communication:  (NT: female external catheter off)  Activity Tolerance: Patient tolerated treatment well Patient left: in bed;with call bell/phone within reach  OT Visit Diagnosis: Other abnormalities of gait and mobility (R26.89);Feeding difficulties (R63.3);Hemiplegia and hemiparesis;Other symptoms and signs involving cognitive function Hemiplegia - Right/Left: Left Hemiplegia - dominant/non-dominant: Dominant Hemiplegia - caused by: Cerebral infarction                Time: 4315-4008 OT Time Calculation (min): 30 min Charges:  OT General Charges $OT Visit: 1 Procedure OT Evaluation $OT Eval Moderate Complexity: 1 Procedure OT Treatments $Therapeutic Activity: 8-22 mins Golden Circle, OTR/L [phone] 08/18/2016

## 2016-08-18 NOTE — Plan of Care (Signed)
Problem: Physical Regulation: Goal: Ability to maintain clinical measurements within normal limits will improve Outcome: Progressing On call hospitalist paged regarding pt's 2.9 potassium level. No new orders or call back yet.   Problem: Skin Integrity: Goal: Risk for impaired skin integrity will decrease Outcome: Progressing Pt repositioned q2h, pillows supporting bony prominences, heels floated, Pt resting comfortably in bed. No new signs of skin break down or other injuries this shift. Will continue to monitor.

## 2016-08-18 NOTE — Progress Notes (Signed)
[facility] PROGRESS NOTE    Progress Note  Tammy Beard  IRC:[id number] DOB: 1940/08/12 DOA: 08/08/2016 PCP: [name], MD     Brief Narrative:   Tammy Beard is an 76 y.o. female past medical history of CVA with left-sided weakness and neglect, paroxysmal atrial fibrillation CHADS-Vasc score at least 4, anticoagulated with Eliquis, recurrent UTIs suppression therapy, was sent from skilled nursing facility for confusion, cough and tachycardia, she was septic with a chest x-ray concerning for pneumonia.  Assessment/Plan:   Severe sepsis ([place]) due to aspiration PNA: Had a swallowing evaluation to severe dysphagia and high risk for aspiration the family is aware. DC Zosyn starting Unasyn. The likely cause of her sepsis with aspiration recurrent aspiration pneumonia. - discussed with speech therapy who states that there is nothing that they can offer to decrease risk of aspiration. Family aware and would like to continue feeds will advance to full liquid diet. Patient has poor prognosis and I have discussed this with her POA who is her brother. - Continue Unasyn - May leave palliative consult for folks at SNF as patient's condition is improving.  Nonsustained V. Tach - Most likely due to hypokalemia will replace.  - Patient is on beta blocker  Acute metabolic encephalopathy: Resolved likely due to above.  Ileus: Resolved. Pt tolerating diet.  Paroxysmal atrial fibrillation: On Cardizem and metoprolol.  Continue apixiban  Essential hypertension elevated today, will monitor throughout the day. Add hydralazine IV when necessary.  Polymyalgia rheumatica (HCC) Continue current dose of prednisone.  DM (diabetes mellitus), type 2, uncontrolled w/neurologic complication (HCC) Continue sliding scale insulin.  Hypokalemia - replace IV and reassess next am.  History of stroke With left-sided hemiparesis, continue physical therapy.  Vascular dementia No  hallucinations at this point or aggressiveness.  stable  DVT prophylaxis: apixiban Family Communication:brother 2 days ago Disposition Plan/Barrier to D/C: With improvement in condition will transition back to facility with palliative over there to assist with future planning Code Status:     Code Status Orders        Start     Ordered   08/09/16 0400  Do not attempt resuscitation (DNR)  Continuous    Question Answer Comment  In the event of cardiac or respiratory ARREST Do not call a "code blue"   In the event of cardiac or respiratory ARREST Do not perform Intubation, CPR, defibrillation or ACLS   In the event of cardiac or respiratory ARREST Use medication by any route, position, wound care, and other measures to relive pain and suffering. May use oxygen, suction and manual treatment of airway obstruction as needed for comfort.      08/09/16 0401    Code Status History    Date Active Date Inactive Code Status Order ID Comments User Context   08/09/2016  1:54 AM 08/09/2016  4:01 AM DNR [id number]  Ward, Delice Bison, DO ED   03/23/2016 10:07 PM 03/27/2016  9:12 PM DNR [id number]  Vianne Bulls, MD ED   11/26/2015  4:29 PM 12/03/2015  7:32 PM DNR [id number]  Waldemar Dickens, MD Inpatient   09/21/2015  9:15 PM 09/26/2015  4:27 PM DNR [id number]  Ivor Costa, MD ED    Advance Directive Documentation     Most Recent Value  Type of Advance Directive  Healthcare Power of Attorney, Living will, Out of facility DNR (pink MOST or yellow form)  Pre-existing out of facility DNR order (yellow form or pink MOST form)  Yellow  form placed in chart (order not valid for inpatient use)  "MOST" Form in Place?  -        IV Access:    Peripheral IV   Procedures and diagnostic studies:   No results found.   Medical Consultants:    None.  Anti-Infectives:   Unasyn  Subjective:    Tammy Beard Pt has no new complaints.  Objective:    Vitals:   08/18/16 0614 08/18/16 0836 08/18/16  0906 08/18/16 1100  BP:   120/71   Pulse: (!) 111 (!) 113 67 69  Resp: 16 16  15   Temp:  98 F (36.7 C)  98.3 F (36.8 C)  TempSrc:  Oral  Oral  SpO2: 95% 97%  98%  Weight:      Height:        Intake/Output Summary (Last 24 hours) at 08/18/16 1436 Last data filed at 08/18/16 0800  Gross per 24 hour  Intake              360 ml  Output              900 ml  Net             -540 ml   Filed Weights   08/16/16 0403 08/17/16 0404 08/18/16 0355  Weight: 51.8 kg (114 lb 1.6 oz) 47.4 kg (104 lb 8 oz) 46.4 kg (102 lb 3.2 oz)    Exam: General exam: In no acute distress. Respiratory system: Good air movement and clear to auscultation. Cardiovascular system:  good air movement clear to auscultation  Gastrointestinal system: Mildly distended positive bowel sounds nontender Central nervous system: alert and oriented Extremities: No edema Skin: no rashes    Data Reviewed:    Labs: Basic Metabolic Panel:  Recent Labs Lab 08/12/16 0458  08/15/16 6010 08/16/16 0304 08/17/16 0351 08/17/16 0850  08/17/16 2344 08/18/16 0326 08/18/16 0626  NA 134*  < > 135 137 135  --   --  130* 133*  --   K 2.6*  < > 2.6* 3.7 2.8*  --   < > 2.9* 2.9*  --   CL 94*  < > 99* 101 96*  --   --  93* 94*  --   CO2 29  < > 27 24 28   --   --  24 25  --   GLUCOSE 108*  < > 135* 103* 117*  --   --  104* 102*  --   BUN 9  < > 5* 5* 7  --   --  9 9  --   CREATININE 0.69  0.74  < > 0.72 0.78 0.76  --   --  0.67 0.76  --   CALCIUM 8.8*  < > 8.6* 8.9 8.8*  --   --  8.4* 8.9  --   MG 1.9  --  2.1 1.9  --  1.8  --   --   --  1.8  PHOS 2.8  --   --   --   --   --   --   --   --   --   < > = values in this interval not displayed. GFR Estimated Creatinine Clearance: 43.8 mL/min (by C-G formula based on SCr of 0.76 mg/dL). Liver Function Tests:  Recent Labs Lab 08/13/16 2126  AST 28  ALT 49  ALKPHOS 175*  BILITOT 0.6  PROT 6.6  ALBUMIN 2.9*   No results for input(s):  LIPASE, AMYLASE in the last 168  hours. No results for input(s): AMMONIA in the last 168 hours. Coagulation profile  Recent Labs Lab 08/13/16 2126  INR 1.40    CBC:  Recent Labs Lab 08/13/16 2126 08/17/16 2344  WBC 9.1 6.5  NEUTROABS 6.7  --   HGB 9.1* 8.5*  HCT 29.4* 27.6*  MCV 91.3 89.6  PLT 305 301   Cardiac Enzymes:  Recent Labs Lab 08/13/16 2126  TROPONINI 0.03*   BNP (last 3 results) No results for input(s): PROBNP in the last 8760 hours. CBG:  Recent Labs Lab 08/17/16 1950 08/18/16 0004 08/18/16 0403 08/18/16 0728 08/18/16 1117  GLUCAP 111* 117* 95 103* 219*   D-Dimer: No results for input(s): DDIMER in the last 72 hours. Hgb A1c: No results for input(s): HGBA1C in the last 72 hours. Lipid Profile:  Recent Labs  08/17/16 0351  CHOL 164  HDL 59  LDLCALC 78  TRIG 135  CHOLHDL 2.8   Thyroid function studies: No results for input(s): TSH, T4TOTAL, T3FREE, THYROIDAB in the last 72 hours.  Invalid input(s): FREET3 Anemia work up: No results for input(s): VITAMINB12, FOLATE, FERRITIN, TIBC, IRON, RETICCTPCT in the last 72 hours. Sepsis Labs:  Recent Labs Lab 08/13/16 2126 08/17/16 2344  WBC 9.1 6.5   Microbiology Recent Results (from the past 240 hour(s))  Blood Culture (routine x 2)     Status: None   Collection Time: 08/09/16 12:55 AM  Result Value Ref Range Status   Specimen Description BLOOD RIGHT HAND  Final   Special Requests BOTTLES DRAWN AEROBIC AND ANAEROBIC BCAV  Final   Culture   Final    NO GROWTH 5 DAYS Performed at [facility], [place] [address], [place], [place] [address]    Report Status 08/14/2016 FINAL  Final  Blood Culture (routine x 2)     Status: None   Collection Time: 08/09/16 12:55 AM  Result Value Ref Range Status   Specimen Description BLOOD LEFT HAND  Final   Special Requests IN PEDIATRIC BOTTLE Blood Culture adequate volume  Final   Culture   Final    NO GROWTH 5 DAYS Performed at [facility], [place] [address],  [place], [place] [address]    Report Status 08/14/2016 FINAL  Final  Urine culture     Status: Abnormal   Collection Time: 08/09/16 12:55 AM  Result Value Ref Range Status   Specimen Description URINE, RANDOM  Final   Special Requests NONE  Final   Culture (A)  Final    >=100,000 COLONIES/mL LACTOBACILLUS SPECIES Standardized susceptibility testing for this organism is not available. 70,000 COLONIES/mL YEAST    Report Status 08/10/2016 FINAL  Final  MRSA PCR Screening     Status: None   Collection Time: 08/09/16  3:36 AM  Result Value Ref Range Status   MRSA by PCR NEGATIVE NEGATIVE Final    Comment:        The GeneXpert MRSA Assay (FDA approved for NASAL specimens only), is one component of a comprehensive MRSA colonization surveillance program. It is not intended to diagnose MRSA infection nor to guide or monitor treatment for MRSA infections.   MRSA PCR Screening     Status: None   Collection Time: 08/13/16 11:12 PM  Result Value Ref Range Status   MRSA by PCR NEGATIVE NEGATIVE Final    Comment:        The GeneXpert MRSA Assay (FDA approved for NASAL specimens only), is one  component of a comprehensive MRSA colonization surveillance program. It is not intended to diagnose MRSA infection nor to guide or monitor treatment for MRSA infections.      Medications:   .  stroke: mapping our early stages of recovery book   Does not apply Once  . apixaban  5 mg Oral BID  . aspirin  81 mg Oral Daily  . chlorhexidine  15 mL Mouth Rinse BID  . insulin aspart  0-9 Units Subcutaneous Q4H  . mouth rinse  15 mL Mouth Rinse q12n4p  . metoprolol tartrate  50 mg Oral BID  . predniSONE  5 mg Oral Q breakfast   Continuous Infusions: . diltiazem (CARDIZEM) infusion 5 mg/hr (08/18/16 0905)    Time spent: 25 min   LOS: 9 days   Velvet Bathe  [facility] Pager [phone]  *Please refer to [email or web address], password [email or web address] to get updated schedule on who will round on this  patient, as hospitalists switch teams weekly. If 7PM-7AM, please contact night-coverage at [email or web address], password [email or web address] for any overnight needs.  08/18/2016, 2:36 PM

## 2016-08-18 NOTE — Progress Notes (Signed)
SLP Cancellation Note  Patient Details Name: Tammy Beard MRN: [id number] DOB: [birth year]   Cancelled treatment:       Reason Eval/Treat Not Completed: Other (comment). Received orders for cognitive linguistic eval, likely part of an order set. Pt has a history of vascular dementia and chronic, severe dysphagia. As seen in prior SLp notes, pt has ongoing aspiration of all texture of PO, does not benefit from thickened liquids and has not responded beneficially to SLP therapeutic interventions. Family is aware and has elected comfort feeding with known risk to preserve pts quality of life. A palliative care consult is pending. Given pts status and history of dementia at baseline, a cognitive linguistic eval by SLP is unlikely to be beneficial or appropriate. If family has questions or concerns about pts dysphagia, please contact me as I would be glad to visit with them.   Herbie [place], [place] CCC-SLP [phone]  Lynann Beaver 08/18/2016, 10:14 AM

## 2016-08-19 DIAGNOSIS — Z7189 Other specified counseling: Secondary | ICD-10-CM

## 2016-08-19 DIAGNOSIS — Z515 Encounter for palliative care: Secondary | ICD-10-CM

## 2016-08-19 DIAGNOSIS — T17908A Unspecified foreign body in respiratory tract, part unspecified causing other injury, initial encounter: Secondary | ICD-10-CM

## 2016-08-19 LAB — GLUCOSE, CAPILLARY
GLUCOSE-CAPILLARY: 128 mg/dL — AB (ref 65–99)
GLUCOSE-CAPILLARY: 139 mg/dL — AB (ref 65–99)
GLUCOSE-CAPILLARY: 227 mg/dL — AB (ref 65–99)
GLUCOSE-CAPILLARY: 87 mg/dL (ref 65–99)
Glucose-Capillary: 109 mg/dL — ABNORMAL HIGH (ref 65–99)
Glucose-Capillary: 120 mg/dL — ABNORMAL HIGH (ref 65–99)
Glucose-Capillary: 225 mg/dL — ABNORMAL HIGH (ref 65–99)

## 2016-08-19 LAB — BASIC METABOLIC PANEL
ANION GAP: 15 (ref 5–15)
BUN: 11 mg/dL (ref 4–21)
BUN: 11 mg/dL (ref 6–20)
CALCIUM: 9.1 mg/dL (ref 8.9–10.3)
CO2: 23 mmol/L (ref 22–32)
CREATININE: 0.9 mg/dL (ref 0.5–1.1)
Chloride: 94 mmol/L — ABNORMAL LOW (ref 101–111)
Creatinine, Ser: 0.85 mg/dL (ref 0.44–1.00)
GFR calc Af Amer: >60 mL/min (ref >60)
GLUCOSE: 108 mg/dL — AB (ref 65–99)
Glucose: 108 mg/dL
POTASSIUM: 3.2 mmol/L — AB (ref 3.5–5.1)
Potassium: 3.2 mmol/L — AB (ref 3.4–5.3)
SODIUM: 132 mmol/L — AB (ref 137–147)
SODIUM: 132 mmol/L — AB (ref 135–145)

## 2016-08-19 LAB — HEMOGLOBIN A1C
HEMOGLOBIN A1C: 5.8 % — AB (ref 4.8–5.6)
Mean Plasma Glucose: 120 mg/dL

## 2016-08-19 MED ORDER — POTASSIUM CITRATE-CITRIC ACID 1100-334 MG/5ML PO SOLN
10.0000 meq | Freq: Three times a day (TID) | ORAL | Status: DC
  Filled 2016-08-19: qty 5

## 2016-08-19 MED ORDER — LORAZEPAM 2 MG/ML PO CONC
0.2500 mg | Freq: Three times a day (TID) | ORAL | Status: DC | PRN
  Administered 2016-08-19: 0.26 mg via ORAL
  Filled 2016-08-19: qty 1

## 2016-08-19 MED ORDER — HYDROCODONE-ACETAMINOPHEN 5-325 MG PO TABS: 1.0000 | ORAL_TABLET | Freq: Four times a day (QID) | ORAL | Status: DC | PRN

## 2016-08-19 MED ORDER — METOPROLOL TARTRATE 5 MG/5ML IV SOLN
5.0000 mg | Freq: Once | INTRAVENOUS | Status: AC
  Administered 2016-08-19: 5 mg via INTRAVENOUS
  Filled 2016-08-19: qty 5

## 2016-08-19 MED ORDER — SOD CITRATE-CITRIC ACID 500-334 MG/5ML PO SOLN
5.0000 mL | Freq: Three times a day (TID) | ORAL | Status: DC
  Administered 2016-08-19 – 2016-08-21 (×6): 5 mL via ORAL
  Filled 2016-08-19 (×7): qty 15

## 2016-08-19 MED ORDER — LIDOCAINE 5 % EX PTCH
1.0000 | MEDICATED_PATCH | CUTANEOUS | Status: DC
  Administered 2016-08-19 – 2016-08-20 (×2): 1 via TRANSDERMAL
  Filled 2016-08-19 (×2): qty 1

## 2016-08-19 MED ORDER — POTASSIUM CHLORIDE 10 MEQ/100ML IV SOLN
10.0000 meq | INTRAVENOUS | Status: AC
  Administered 2016-08-19 (×2): 10 meq via INTRAVENOUS
  Filled 2016-08-19 (×2): qty 100

## 2016-08-19 MED ORDER — ACETAMINOPHEN 325 MG PO TABS
650.0000 mg | ORAL_TABLET | Freq: Three times a day (TID) | ORAL | Status: DC
  Administered 2016-08-19 – 2016-08-21 (×6): 650 mg via ORAL
  Filled 2016-08-19 (×6): qty 2

## 2016-08-19 MED ORDER — DEXTROSE 5 % IV SOLN
5.0000 mg/h | INTRAVENOUS | Status: DC
  Administered 2016-08-19: 15 mg/h via INTRAVENOUS
  Administered 2016-08-19: 5 mg/h via INTRAVENOUS
  Administered 2016-08-20 (×2): 15 mg/h via INTRAVENOUS
  Filled 2016-08-19 (×4): qty 100

## 2016-08-19 NOTE — Consult Note (Addendum)
[facility] Nurse wound consult note Reason for Consult: Consult requested for buttocks and sacrum.   Wound type: Pt is very thin with protruding sacrum bone; stage 3 pressure injury .3X.3X.2cm, pale red wound bed, no odor or drainage Pressure Injury POA: Yes; noted on the LDA Measurement: Lower area surrounding sacrum involves bilat buttocks; affected area is approx 10X10cm, red macerated skin, tender to the touch, partial thickness skin loss.  Appearance is consistent with moisture associated skin damage.   Dressing procedure/placement/frequency: Pt has a foam dressing to protect sacrum and promote healing.  She has a Purewick device in place to assist with absorbing urinary incontinence and promote drying and healing of moisture associated skin damage.  Barrier cream to protect and repel moisture.  Discussed plan of care with patient and she verbalized understanding. Please re-consult if further assistance is needed.  Thank-you,  Julien Girt MSN, [place], [place], [place], [place]

## 2016-08-19 NOTE — Care Management Important Message (Signed)
Important Message  Patient Details  Name: Tammy Beard MRN: [id number] Date of Birth: 05-20-1940   Medicare Important Message Given:  Yes    Nathen May 08/19/2016, 1:15 PM

## 2016-08-19 NOTE — Progress Notes (Signed)
Pt c/o of pain to sacrum both when sitting in chair and laying in bed, foam dressing and repositioning giving very little relief, wound consult placed, will continue to monitor closely with frequent repositioning.  Edward Qualia RN

## 2016-08-19 NOTE — Progress Notes (Signed)
Physical Therapy Treatment/ Discharge Patient Details Name: Tammy Beard MRN: [id number] DOB: 11-28-1940 Today's Date: 08/19/2016    History of Present Illness 76 y.o. female past medical history of CVA with left-sided weakness and neglect, paroxysmal atrial fibrillation CHADS- recurrent UTIs, was sent from SNF for confusion, cough and tachycardia, she was septic with a chest x-ray concerning for pneumonia.[facility] MRI: Tiny RIGHT occipital and LEFT temporal lobe acute versus subacute infarcts. Old RIGHT parietal/ MCA territory infarct    PT Comments    Pt sleeping on arrival but easily awakened. Pt unable to recall PLOF, place or situation. Pt max-total assist for bed mobility and transfer OOB to chair which is baseline functional status per sister. Pt reports she does not receive therapy at SNF and with current status as well as normal need for total assist pt not currently appropriate for acute therapy. RN present and aware of transfer performed as well as need for lift. Will sign off.   HR 135 with transfer to chair  Follow Up Recommendations  No PT follow up;SNF (return to SNF)     Equipment Recommendations  Wheelchair cushion (measurements PT)    Recommendations for Other Services       Precautions / Restrictions Precautions Precautions: Fall Restrictions Weight Bearing Restrictions: No    Mobility  Bed Mobility Overal bed mobility: Needs Assistance Bed Mobility: Rolling;Sidelying to Sit Rolling: Max assist Sidelying to sit: Total assist       General bed mobility comments: pt able to reach for left rail to roll but otherwise required total assist. Assist to elevate trunk from surface and bring legs off of bed. posterior lean with max assist for sitting balance EOB  Transfers Overall transfer level: Needs assistance   Transfers: Squat Pivot Transfers     Squat pivot transfers: Total assist;+2 safety/equipment;+2 physical assistance;From elevated surface      General transfer comment: total assist +2 with use of pad to scoop sacrum and pivot pt bed to chair with limited weight on bil feet. Pt scooted back in chair with total assist of pad. Per sister pt lifted OOB at baseline  Ambulation/Gait                 Stairs            Wheelchair Mobility    Modified Rankin (Stroke Patients Only)       Balance Overall balance assessment: Needs assistance   Sitting balance-Leahy Scale: Zero Sitting balance - Comments: EOB 1 min pt with maintained posterior lean and max assist                                    Cognition Arousal/Alertness: Awake/alert Behavior During Therapy: Flat affect Overall Cognitive Status: No family/caregiver present to determine baseline cognitive functioning                                 General Comments: oriented to self only, not to location/situation, follows some commands      Exercises      General Comments        Pertinent Vitals/Pain Faces Pain Scale: Hurts little more Pain Location: right knee with movement Pain Descriptors / Indicators: Sore Pain Intervention(s): Repositioned;Monitored during session    Home Living  Prior Function            PT Goals (current goals can now be found in the care plan section) Progress towards PT goals: Not progressing toward goals - comment (goals not appropriate as pt total assist at baseline and will not appropriate for acute therapy)    Frequency           PT Plan Discharge plan needs to be updated    Co-evaluation              AM-PAC PT "6 Clicks" Daily Activity  Outcome Measure    Difficulty moving from lying on back to sitting on the side of the bed? : Total Difficulty sitting down on and standing up from a chair with arms (e.g., wheelchair, bedside commode, etc,.)?: Total Help needed moving to and from a bed to chair (including a wheelchair)?: Total Help needed  walking in hospital room?: Total Help needed climbing 3-5 steps with a railing? : Total 6 Click Score: 5    End of Session   Activity Tolerance: Patient tolerated treatment well Patient left: in chair;with call bell/phone within reach;with chair alarm set;with nursing/sitter in room Nurse Communication: Need for lift equipment;Mobility status PT Visit Diagnosis: Muscle weakness (generalized) (M62.81);Other abnormalities of gait and mobility (R26.89)     Time: 6767-2094 PT Time Calculation (min) (ACUTE ONLY): 18 min  Charges:  $Therapeutic Activity: 8-22 mins                    G Codes:       Elwyn Reach, PT [phone]   Sandy Salaam [place] 08/19/2016, 12:38 PM

## 2016-08-19 NOTE — Progress Notes (Signed)
Pts Brother (POA) requesting palliative care consult, Dr Wendee Beavers notified of request, brother stated he would be available all day as needed.  Edward Qualia RN

## 2016-08-19 NOTE — Plan of Care (Signed)
Problem: Education: Goal: Knowledge of [facility] Education information/materials will improve Outcome: Progressing Pt and son given verbal education

## 2016-08-19 NOTE — Plan of Care (Signed)
Problem: Health Behavior/Discharge Planning: Goal: Ability to manage health-related needs will improve Outcome: Progressing Pt complaining of nausea. No other pain or concerns. On call hospitalist notified. Order for one time dose of 12.5 phenergan given. Pt now resting comfortable and no complaints of nausea. Will continue to monitor.

## 2016-08-19 NOTE — Progress Notes (Signed)
[facility] PROGRESS NOTE    Progress Note  Tammy Beard  IWP:[id number] DOB: October 22, 1940 DOA: 08/08/2016 PCP: [name], MD    Brief Narrative:   Tammy Beard is an 76 y.o. female past medical history of CVA with left-sided weakness and neglect, paroxysmal atrial fibrillation CHADS-Vasc score at least 4, anticoagulated with Eliquis, recurrent UTIs suppression therapy, was sent from skilled nursing facility for confusion, cough and tachycardia, she was septic with a chest x-ray concerning for pneumonia.  Assessment/Plan:   Severe sepsis ([place]) due to aspiration PNA: Had a swallowing evaluation to severe dysphagia and high risk for aspiration the family is aware. DC Zosyn starting Unasyn. The likely cause of her sepsis with aspiration recurrent aspiration pneumonia. - discussed with speech therapy who states that there is nothing that they can offer to decrease risk of aspiration. Family aware and would like to continue feeds will advance to full liquid diet. Patient has poor prognosis and I have discussed this with her POA who is her brother. - Continue Unasyn - Palliative Team consulted for Goals of care. Antibiotics were given for 8 days. Leukocytosis resolved  Nonsustained V. Tach - Most likely due to hypokalemia continue to replace. - Patient is on beta blocker  Acute metabolic encephalopathy: Resolved likely due to above.  Ileus: Resolved. Pt tolerating diet.  Paroxysmal atrial fibrillation: On Cardizem and metoprolol.  Continue apixiban  Essential hypertension elevated today, will monitor throughout the day. Add hydralazine IV when necessary.  Polymyalgia rheumatica (HCC) Continue current dose of prednisone.  DM (diabetes mellitus), type 2, uncontrolled w/neurologic complication (HCC) Continue sliding scale insulin.  Hypokalemia - replace IV and reassess next am. - assess magnesium level.  History of stroke With left-sided hemiparesis,  continue physical therapy.  Vascular dementia No hallucinations at this point or aggressiveness.  stable  DVT prophylaxis: apixiban Family Communication: brother  POA Disposition Plan/Barrier to D/C: pending visit with palliative team   Code Status:     Code Status Orders        Start     Ordered   08/09/16 0400  Do not attempt resuscitation (DNR)  Continuous    Question Answer Comment  In the event of cardiac or respiratory ARREST Do not call a "code blue"   In the event of cardiac or respiratory ARREST Do not perform Intubation, CPR, defibrillation or ACLS   In the event of cardiac or respiratory ARREST Use medication by any route, position, wound care, and other measures to relive pain and suffering. May use oxygen, suction and manual treatment of airway obstruction as needed for comfort.      08/09/16 0401    Code Status History    Date Active Date Inactive Code Status Order ID Comments User Context   08/09/2016  1:54 AM 08/09/2016  4:01 AM DNR [id number]  Ward, Delice Bison, DO ED   03/23/2016 10:07 PM 03/27/2016  9:12 PM DNR [id number]  Vianne Bulls, MD ED   11/26/2015  4:29 PM 12/03/2015  7:32 PM DNR [id number]  Waldemar Dickens, MD Inpatient   09/21/2015  9:15 PM 09/26/2015  4:27 PM DNR [id number]  Ivor Costa, MD ED    Advance Directive Documentation     Most Recent Value  Type of Advance Directive  Healthcare Power of Attorney, Living will, Out of facility DNR (pink MOST or yellow form)  Pre-existing out of facility DNR order (yellow form or pink MOST form)  Yellow form placed in chart (order not  valid for inpatient use)  "MOST" Form in Place?  -        IV Access:    Peripheral IV   Procedures and diagnostic studies:   No results found.   Medical Consultants:    None.  Anti-Infectives:   Unasyn  Subjective:    Tammy Beard Pt has no new complaints.  Objective:    Vitals:   08/19/16 0637 08/19/16 0643 08/19/16 1233 08/19/16 1248  BP:    112/72    Pulse: (!) 51 (!) 59 (!) 135 (!) 121  Resp: (!) 23 (!) 29  19  Temp:    98.2 F (36.8 C)  TempSrc:    Axillary  SpO2: 99% 96%  97%  Weight:      Height:        Intake/Output Summary (Last 24 hours) at 08/19/16 1635 Last data filed at 08/19/16 0600  Gross per 24 hour  Intake                0 ml  Output             1150 ml  Net            -1150 ml   Filed Weights   08/16/16 0403 08/17/16 0404 08/18/16 0355  Weight: 51.8 kg (114 lb 1.6 oz) 47.4 kg (104 lb 8 oz) 46.4 kg (102 lb 3.2 oz)    Exam: General exam: In no acute distress. Respiratory system: Good air movement and clear to auscultation. Cardiovascular system:  good air movement clear to auscultation  Gastrointestinal system: Mildly distended positive bowel sounds nontender Central nervous system: alert and oriented Extremities: No edema Skin: no rashes    Data Reviewed:    Labs: Basic Metabolic Panel:  Recent Labs Lab 08/15/16 0333 08/16/16 0304 08/17/16 0351 08/17/16 4235  08/17/16 2344 08/18/16 0326 08/18/16 0626 08/19/16 0607  NA 135 137 135  --   --  130* 133*  --  132*  K 2.6* 3.7 2.8*  --   < > 2.9* 2.9*  --  3.2*  CL 99* 101 96*  --   --  93* 94*  --  94*  CO2 27 24 28   --   --  24 25  --  23  GLUCOSE 135* 103* 117*  --   --  104* 102*  --  108*  BUN 5* 5* 7  --   --  9 9  --  11  CREATININE 0.72 0.78 0.76  --   --  0.67 0.76  --  0.85  CALCIUM 8.6* 8.9 8.8*  --   --  8.4* 8.9  --  9.1  MG 2.1 1.9  --  1.8  --   --   --  1.8  --   < > = values in this interval not displayed. GFR Estimated Creatinine Clearance: 41.2 mL/min (by C-G formula based on SCr of 0.85 mg/dL). Liver Function Tests:  Recent Labs Lab 08/13/16 2126  AST 28  ALT 49  ALKPHOS 175*  BILITOT 0.6  PROT 6.6  ALBUMIN 2.9*   No results for input(s): LIPASE, AMYLASE in the last 168 hours. No results for input(s): AMMONIA in the last 168 hours. Coagulation profile  Recent Labs Lab 08/13/16 2126  INR 1.40     CBC:  Recent Labs Lab 08/13/16 2126 08/17/16 2344  WBC 9.1 6.5  NEUTROABS 6.7  --   HGB 9.1* 8.5*  HCT 29.4* 27.6*  MCV 91.3  89.6  PLT 305 301   Cardiac Enzymes:  Recent Labs Lab 08/13/16 2126  TROPONINI 0.03*   BNP (last 3 results) No results for input(s): PROBNP in the last 8760 hours. CBG:  Recent Labs Lab 08/19/16 0036 08/19/16 0343 08/19/16 0734 08/19/16 1132 08/19/16 1612  GLUCAP 87 109* 128* 139* 227*   D-Dimer: No results for input(s): DDIMER in the last 72 hours. Hgb A1c:  Recent Labs  08/17/16 0351  HGBA1C 5.8*   Lipid Profile:  Recent Labs  08/17/16 0351  CHOL 164  HDL 59  LDLCALC 78  TRIG 135  CHOLHDL 2.8   Thyroid function studies: No results for input(s): TSH, T4TOTAL, T3FREE, THYROIDAB in the last 72 hours.  Invalid input(s): FREET3 Anemia work up: No results for input(s): VITAMINB12, FOLATE, FERRITIN, TIBC, IRON, RETICCTPCT in the last 72 hours. Sepsis Labs:  Recent Labs Lab 08/13/16 2126 08/17/16 2344  WBC 9.1 6.5   Microbiology Recent Results (from the past 240 hour(s))  MRSA PCR Screening     Status: None   Collection Time: 08/13/16 11:12 PM  Result Value Ref Range Status   MRSA by PCR NEGATIVE NEGATIVE Final    Comment:        The GeneXpert MRSA Assay (FDA approved for NASAL specimens only), is one component of a comprehensive MRSA colonization surveillance program. It is not intended to diagnose MRSA infection nor to guide or monitor treatment for MRSA infections.      Medications:   .  stroke: mapping our early stages of recovery book   Does not apply Once  . acetaminophen  650 mg Oral Q8H  . apixaban  5 mg Oral BID  . aspirin  81 mg Oral Daily  . chlorhexidine  15 mL Mouth Rinse BID  . insulin aspart  0-9 Units Subcutaneous Q4H  . lidocaine  1 patch Transdermal Q24H  . mouth rinse  15 mL Mouth Rinse q12n4p  . metoprolol tartrate  50 mg Oral BID  . predniSONE  5 mg Oral Q breakfast  . sodium  citrate-citric acid  5 mL Oral TID   Continuous Infusions: . diltiazem (CARDIZEM) infusion 12.5 mg/hr (08/19/16 1443)    Time spent: 25 min   LOS: 10 days   Velvet Bathe  [facility] Pager [phone]  *Please refer to [email or web address], password [email or web address] to get updated schedule on who will round on this patient, as hospitalists switch teams weekly. If 7PM-7AM, please contact night-coverage at [email or web address], password [email or web address] for any overnight needs.  08/19/2016, 4:35 PM

## 2016-08-19 NOTE — Consult Note (Signed)
Consultation Note Date: 08/19/2016   Patient Name: Tammy Beard  DOB: 1941-03-01  MRN: [id number]  Age / Sex: 76 y.o., female  PCP: [name], MD Referring Physician: Velvet Bathe, MD  Reason for Consultation: Establishing goals of care  HPI/Patient Profile: 76 y.o. female  with past medical history of polio, rheumatic fever, U/C, PMR, afib on Eliquis, CVA w residual left sided hemiparesis and neglect, recurrent UTIs, and a life time of being developmentally slow who was admitted on 08/08/2016 with aspiration pneumonia.  Her hospitalization has been complicated by afib with RVR, ileus, and an apparent neurological change (stroke rule out).   Clinical Assessment and Goals of Care:  I have reviewed medical records including EPIC notes, labs and imaging, received report from the bedside RN, assessed the patient and then met at the bedside along with her brother [facility]) Laurey Arrow, to discuss diagnosis prognosis, GOC, EOL wishes, disposition and options. Noreene Filbert, is Co-HCPOA with his sister Juliann Pulse.  Their father (20 yo) still also actively participates in making decisions for his daughter Fraser Din.  I introduced Palliative Medicine as specialized medical care for people living with serious illness. It focuses on providing relief from the symptoms and stress of a serious illness. The goal is to improve quality of life for both the patient and the family.  We discussed a brief life review of the patient.  Laurey Arrow felt his sister's life has been one of some sadness and disappointment.  She was never able to do the things others could, and was never able to be accepted socially.  He speculated that she likely has a form of autism.  Then I assessed functional and nutritional status at home by gathering history.   Her large stroke at the end of December 2017 really changed her life.  She has left sided hemiparesis and  neglect, her ability to swallow became questionable, her hearing seemed to worsen and she seemed to focus even more inward than she ever has before.  We discussed her current illness - aspiration pneumonia, and what it means in the larger context of her on-going co-morbidities.  She has a significant amount of brain atrophy.  It is likely that her functionality will continue to become worse - particularly her ability to swallow and tendency to develop aspiration pneumonia.  She no longer can stand and pivot and hence is immobile without full assistance.  She has no desire to work with PT or SLP, and frankly I do not believe these therapies would be of much help to her.  She is developing a painful area on her "back side" and is at high risk of pressure ulcerations in the future.  Natural disease trajectory and expectations at EOL were discussed.  I attempted to elicit values and goals of care important to the patient.  She seems to be most comforted by TV shows, food, and her family.  Unfortunately as her eye sight and hearing decline even TV shows are less important to her.  We reviewed a MOST  form together - Pete reiterated the desire for DNR and felt reasonably confident that the family would decide not to continue to transport Pat to the hospital again (as she has been) but would rather keep her comfortable at [facility] in long term care.  We discussed the use of antibiotics, IVF, and feeding tubes.  Laurey Arrow was clear that feeding tubes were off the table.  We discussed the cycle of -  UTI then dehydration / giving IVF / then UTI and dehydration again.  Laurey Arrow commented that he realizes she is already in that cycle.  He would like to discuss the use of antibiotics/IVF with his family.  The difference between aggressive medical intervention and comfort care was considered in light of the patient's goals of care.   Advanced directives, concepts specific to code status, artifical feeding and hydration,  and rehospitalization were considered and discussed.  Hospice and Palliative Care services outpatient were explained and offered.  Questions and concerns were addressed.  Hard Choices booklet left for review. We agreed to talk again tomorrow after Laurey Arrow has a chance to discuss our conversation with his family and make decisions.  Primary Decision Maker:  Bonnye Fava and Delpha Perko (brother and sister to the patient)    SUMMARY OF RECOMMENDATIONS    Anticipate the family will tell me tomorrow they would like for her to go into [facility] with [employer].  If they choose this - please put it in the discharge summary.    Code Status/Advance Care Planning:  DNR   Symptom Management:   Mattress overlay and cushion for bottom, scheduled tylenol - Sacral Pain  Bedside RN consulted [place] for sacral pain.  Patient will require frequent repositioning.  Potassium solution - to help with ileus.  Patient with SOB and mild panic attack.  Will add low dose benzodiazepine solution PRN  Additional Recommendations (Limitations, Scope, Preferences):  I believe that after Laurey Arrow discusses our conversation with his family we will complete the MOST form tomorrow specifying [place], and likely engage hospice services.  Palliative Prophylaxis:   Frequent Pain Assessment  Psycho-social/Spiritual:  Desire for further Chaplaincy support: No.  Perhaps to support the family rather than the patient.  Prognosis:   < 6 months secondary to immobility, recurrent infections, recurrent aspiration with neurological decline.  Rapid decline over the last few months.  Significantly reduced PO intake.  Discharge Planning: Patient likely would not tolerate skilled therapies.  PMT recommends Long Term Care at [facility] with Hospice Services - if the family agrees and [facility] has availability.       Primary Diagnoses: Present on Admission: . Aspiration  pneumonia ([place]) . Essential hypertension . Polymyalgia rheumatica ([place]) . Left hemiparesis ([place]) . Paroxysmal atrial fibrillation (HCC) . DM (diabetes mellitus), type 2, uncontrolled w/neurologic complication ([place]) . Dysphagia, pharyngoesophageal phase . Vascular dementia   I have reviewed the medical record, interviewed the patient and family, and examined the patient. The following aspects are pertinent.  Past Medical History:  Diagnosis Date  . Colitis   . Ecchymosis 11/15/2015  . Fracture of right radius 02/2016  . GERD (gastroesophageal reflux disease)   . Heart murmur   . Hyperlipidemia   . Hypertension   . Osteopenia    s/p 7 yrs Fosamax  . Polio   . Polymyalgia ([place])   . Rheumatic fever   . Sleep disorder   . Stroke ([place])   . Ulcerative colitis  Social History   Social History  . Marital status: Single    Spouse name: N/A  . Number of children: N/A  . Years of education: N/A   Occupational History  . retired    Social History Main Topics  . Smoking status: Never Smoker  . Smokeless tobacco: Never Used  . Alcohol use Yes     Comment: occasional once a year  . Drug use: No  . Sexual activity: No   Other Topics Concern  . None   Social History Narrative   Daily Caffeine use- soda (diet)      Do you drink/ eat things with caffeine? Yes, sometimes      Marital status:    Never                            Lives at [facility], transfer to skill unit 09/26/15, transfer to [place] 11/09/15. Moved to skill unit 03/28/16      Do you have any pets in your home ?(please list) No      Current or past profession: Geophysicist/field seismologist for Social worker      Do you exercise?    No                             Living Will, POA, DNR, MOST   Family History  Problem Relation Age of Onset  . Pancreatic cancer Other   . Other Mother        Coad/ Bronchiectosis  . Diabetes Paternal Grandmother   . Heart failure Paternal Grandmother   . Cancer Paternal Aunt        unaware of  primary site  . Coronary artery disease Neg Hx   . Colon cancer Neg Hx   . Rectal cancer Neg Hx   . Stomach cancer Neg Hx   . Heart attack Neg Hx    Scheduled Meds: .  stroke: mapping our early stages of recovery book   Does not apply Once  . apixaban  5 mg Oral BID  . aspirin  81 mg Oral Daily  . chlorhexidine  15 mL Mouth Rinse BID  . insulin aspart  0-9 Units Subcutaneous Q4H  . mouth rinse  15 mL Mouth Rinse q12n4p  . metoprolol tartrate  50 mg Oral BID  . predniSONE  5 mg Oral Q breakfast   Continuous Infusions: . diltiazem (CARDIZEM) infusion 10 mg/hr (08/19/16 0935)   PRN Meds:.metoprolol tartrate No Known Allergies Review of Systems  Complains of sacral pain.   Unable to give full ROS due to mental disability  Physical Exam  Awake, appropriate female, developmentally disabled.  Cooperative Sitting in a recliner chair complaining of sacral pain.  Vital Signs: BP 112/72 (BP Location: Left Arm)   Pulse (!) 121   Temp 98.2 F (36.8 C) (Axillary)   Resp 19   Ht 5' 1"  (1.549 m)   Wt 46.4 kg (102 lb 3.2 oz)   SpO2 97%   BMI 19.31 kg/m  Pain Assessment: 0-10   Pain Score: 3    SpO2: SpO2: 97 % O2 Device:SpO2: 97 % O2 Flow Rate: .O2 Flow Rate (L/min): 1 L/min  IO: Intake/output summary:  Intake/Output Summary (Last 24 hours) at 08/19/16 1337 Last data filed at 08/19/16 0600  Gross per 24 hour  Intake  0 ml  Output             1150 ml  Net            -1150 ml    LBM: Last BM Date:  (5/23) Baseline Weight: Weight: 45.8 kg (101 lb) Most recent weight: Weight: 46.4 kg (102 lb 3.2 oz)     Palliative Assessment/Data:     Time In: 3:30 Time Out: 4:51 Time Total: 81 min Greater than 50%  of this time was spent counseling and coordinating care related to the above assessment and plan.  Signed by: Imogene Burn, PA-C Palliative Medicine Pager: [phone]  Please contact Palliative Medicine Team phone at [phone] for questions and  concerns.  For individual provider: See Shea Evans

## 2016-08-19 NOTE — Clinical Social Work Note (Signed)
Clinical Social Worker continuing to follow patient and family for support and discharge planning needs.  Patient original plan was return to [facility].  Patient brother has requested Palliative Consult and CSW will await any possible changes with disposition.  CSW remains available for support and to facilitate patient discharge needs once medically stable.  Barbette Or, [place]

## 2016-08-20 ENCOUNTER — Encounter: Payer: Self-pay | Admitting: Nurse Practitioner

## 2016-08-20 DIAGNOSIS — Z7189 Other specified counseling: Secondary | ICD-10-CM

## 2016-08-20 DIAGNOSIS — J189 Pneumonia, unspecified organism: Secondary | ICD-10-CM

## 2016-08-20 DIAGNOSIS — I639 Cerebral infarction, unspecified: Secondary | ICD-10-CM

## 2016-08-20 LAB — BASIC METABOLIC PANEL
ANION GAP: 11 (ref 5–15)
BUN: 11 mg/dL (ref 4–21)
BUN: 11 mg/dL (ref 6–20)
CHLORIDE: 96 mmol/L — AB (ref 101–111)
CO2: 24 mmol/L (ref 22–32)
Calcium: 8.5 mg/dL — ABNORMAL LOW (ref 8.9–10.3)
Creatinine, Ser: 0.7 mg/dL (ref 0.44–1.00)
Creatinine: 0.7 mg/dL (ref 0.5–1.1)
GFR calc non Af Amer: >60 mL/min (ref >60)
GLUCOSE: 139 mg/dL
Glucose, Bld: 139 mg/dL — ABNORMAL HIGH (ref 65–99)
Potassium: 2.9 mmol/L — AB (ref 3.4–5.3)
Potassium: 2.9 mmol/L — ABNORMAL LOW (ref 3.5–5.1)
SODIUM: 131 mmol/L — AB (ref 135–145)
Sodium: 131 mmol/L — AB (ref 137–147)

## 2016-08-20 LAB — GLUCOSE, CAPILLARY
GLUCOSE-CAPILLARY: 202 mg/dL — AB (ref 65–99)
GLUCOSE-CAPILLARY: 174 mg/dL — AB (ref 65–99)
Glucose-Capillary: 108 mg/dL — ABNORMAL HIGH (ref 65–99)
Glucose-Capillary: 123 mg/dL — ABNORMAL HIGH (ref 65–99)
Glucose-Capillary: 181 mg/dL — ABNORMAL HIGH (ref 65–99)

## 2016-08-20 LAB — MAGNESIUM: MAGNESIUM: 1.7 mg/dL (ref 1.7–2.4)

## 2016-08-20 MED ORDER — POTASSIUM CHLORIDE 20 MEQ/15ML (10%) PO SOLN
40.0000 meq | Freq: Every day | ORAL | Status: DC
  Filled 2016-08-20: qty 30

## 2016-08-20 MED ORDER — POTASSIUM CHLORIDE CRYS ER 20 MEQ PO TBCR
40.0000 meq | EXTENDED_RELEASE_TABLET | Freq: Two times a day (BID) | ORAL | Status: AC
  Administered 2016-08-20 (×2): 40 meq via ORAL
  Filled 2016-08-20 (×2): qty 2

## 2016-08-20 MED ORDER — MAGNESIUM SULFATE 2 GM/50ML IV SOLN
2.0000 g | Freq: Once | INTRAVENOUS | Status: AC
  Administered 2016-08-20: 2 g via INTRAVENOUS
  Filled 2016-08-20: qty 50

## 2016-08-20 MED ORDER — DILTIAZEM HCL 60 MG PO TABS
90.0000 mg | ORAL_TABLET | Freq: Two times a day (BID) | ORAL | Status: DC
  Administered 2016-08-20 – 2016-08-21 (×3): 90 mg via ORAL
  Filled 2016-08-20 (×3): qty 1

## 2016-08-20 NOTE — Progress Notes (Signed)
Nutrition Brief Note  Chart reviewed. Plans for patient to d/c to SNF with Hospice and comfort measures.  No further nutrition interventions warranted at this time.  Please consult as needed.   Molli Barrows, RD, LDN, [place] Pager [phone] After Hours Pager [phone]

## 2016-08-20 NOTE — Progress Notes (Signed)
PROGRESS NOTE    Tammy Beard  ZSM:[id number] DOB: March 07, 1941 DOA: 08/08/2016 PCP: [name], MD   Chief Complaint  Patient presents with  . Atrial Fibrillation     Brief Narrative:  HPI on 08/09/2016 by Dr. Lily Kocher Tammy Beard is a 76 y.o. 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  The patient is hard of hearing at baseline with cognitive impairment.  Her brother reports that she was last known well two days ago; he visited her and she seemed behaving like her normal self.  By the next day, she was demonstrating increased confusion and told him "I don't feel well".  She has a new wet cough and tachycardia.  SNF personnel was concerned that she was in atrial fibrillation, so she was sent to the ED for further evaluation.  Interim history Patient was admitted for severe sepsis secondary to aspiration pneumonia. Palliative care was consulted and has opted for patient to be transitioned back to nursing facility with hospice to follow there.  Currently on Cardizem drip, will attempt to wean.   Assessment & Plan   Severe sepsis secondary to aspiration pneumonia -Patient did have swallow evaluation showing the severe dysphagia, family made aware -Initially placed on Zosyn and transitioned to Unasyn. Appears patient has completed her antibiotic course during hospitalization -Family aware of patient's aspiration risk and would like to continue feedings. Diet was advanced to full liquid diet.  Nonsustained V. Tach -Likely secondary to hypokalemia -Appears to be asymptomatic -Continue to supplement potassium -Echocardiogram done on 08/16/2016: 60-65%, wall motion was  normal  Paroxysmal atrial fibrillation -CHADSVASC at least 4 -Continue Eliquis, metoprolol -Currently on Cardizem drip -will attempt to wean Cardizem drip. Will order oral Cardizem   Acute metabolic encephalopathy -Likely secondary to sepsis, patient does appear to be at baseline  Ileus -Resolved, tolerating liquid diet  Essential hypertension -Continue Cardizem, metoprolol -Continue IV hydralazine as needed  Polymyalgia rheumatica -Continue prednisone  Diabetes mellitus, type II -Continue insulin sliding scale CBG monitoring  Hypokalemia -Potassium 2.9 despite treatment, will continue to supplement and monitor BMP -Magnesium 1.7, will replace, for a goal of 2. Hopefully this will help her potassium levels will rise as well.  Stroke -With left-sided hemiparesis -CT head without contrast shows no acute intracranial abnormalities, on 5/19 as well as 08/13/2016 -Neurology consulted and appreciated. During hospitalization, it seems that patient did have a code stroke due to her decline in mentation. -MRI on 08/16/2016 showed tiny right occipital and left temporal lobe acute versus subacute infarcts. Old right parietal/MCA territory infarct. -Neurology felt these infarcts likely to be embolic secondary to atrial fibrillation. Recommending continuing Eliquis -LDL 78 -hemoglobin A1c 5.8 -Continue physical therapy  Vascular dementia -Appears stable  Goals of care -Palliative care consulted and appreciated. Patient does have several comorbidities. Patient currently DO NOT RESUSCITATE. Plan for patient to be discharged to skilled nursing facility with hospice to follow.  DVT Prophylaxis  Eliquis  Code Status: DNR  Family Communication: None at bedside  Disposition Plan: Admitted. Will discharge to assess in half once weaned off of Cardizem drip and potassium has been replaced.  Consultants Palliative care Neurology  Procedures  Echocardiogram  Antibiotics    Anti-infectives    Start  Dose/Rate Route Frequency Ordered Stop   08/13/16 1400  Ampicillin-Sulbactam (UNASYN) 3 g in sodium chloride 0.9 % 100 mL IVPB     3 g 200 mL/hr over 30 Minutes Intravenous Every 6 hours 08/13/16 1126 08/16/16 0149   08/12/16 1400  piperacillin-tazobactam (ZOSYN) IVPB 3.375 g  Status:  Discontinued     3.375 g 100 mL/hr over 30 Minutes Intravenous Every 8 hours 08/12/16 1245 08/12/16 1245   08/12/16 1400  piperacillin-tazobactam (ZOSYN) IVPB 3.375 g  Status:  Discontinued     3.375 g 12.5 mL/hr over 240 Minutes Intravenous Every 8 hours 08/12/16 1246 08/13/16 1126   08/12/16 1230  amoxicillin-clavulanate (AUGMENTIN) 875-125 MG per tablet 1 tablet  Status:  Discontinued     1 tablet Oral 2 times daily 08/12/16 1226 08/12/16 1244   08/09/16 1600  vancomycin (VANCOCIN) 500 mg in sodium chloride 0.9 % 100 mL IVPB  Status:  Discontinued     500 mg 100 mL/hr over 60 Minutes Intravenous Every 12 hours 08/09/16 0140 08/10/16 0846   08/09/16 1200  ceFEPIme (MAXIPIME) 1 g in dextrose 5 % 50 mL IVPB  Status:  Discontinued     1 g 100 mL/hr over 30 Minutes Intravenous Every 12 hours 08/09/16 0140 08/09/16 0401   08/09/16 1000  trimethoprim (TRIMPEX) tablet 100 mg  Status:  Discontinued     100 mg Oral Daily 08/09/16 0401 08/09/16 1136   08/09/16 0600  piperacillin-tazobactam (ZOSYN) IVPB 3.375 g  Status:  Discontinued     3.375 g 12.5 mL/hr over 240 Minutes Intravenous Every 8 hours 08/09/16 0414 08/12/16 1226   08/09/16 0015  ceFEPIme (MAXIPIME) 2 g in dextrose 5 % 50 mL IVPB     2 g 100 mL/hr over 30 Minutes Intravenous  Once 08/09/16 0001 08/09/16 0138   08/09/16 0015  vancomycin (VANCOCIN) IVPB 1000 mg/200 mL premix     1,000 mg 200 mL/hr over 60 Minutes Intravenous  Once 08/09/16 0001 08/09/16 0305      Subjective:   Tammy Beard seen and examined today.  Currently denies any chest pain, shortness breath, abdominal pain, nausea or vomiting, diarrhea or  constipation.  Objective:   Vitals:   08/20/16 0425 08/20/16 0447 08/20/16 0700 08/20/16 1127  BP: 122/79 (!) 114/91 (!) 120/107 123/80  Pulse: (!) 56 (!) 106 (!) 59 (!) 56  Resp: 18 11 (!) 25 (!) 28  Temp:    98.8 F (37.1 C)  TempSrc:    Oral  SpO2: 95% 96% 99% 93%  Weight:      Height:        Intake/Output Summary (Last 24 hours) at 08/20/16 1358 Last data filed at 08/20/16 1251  Gross per 24 hour  Intake           465.71 ml  Output              900 ml  Net          -434.29 ml   Filed Weights   08/17/16 0404 08/18/16 0355 08/20/16 0424  Weight: 47.4 kg (104 lb 8 oz) 46.4 kg (102 lb 3.2 oz) 47.5 kg (104 lb 12.8 oz)    Exam  General: Well developed, well nourished, NAD, appears stated age  HEENT: NCAT, mucous membranes moist.   Cardiovascular: S1 S2 auscultated, Irregularly irregular  Respiratory: Clear to auscultation bilaterally with equal chest rise  Abdomen: Soft, nontender, nondistended, + bowel sounds  Extremities: warm dry without cyanosis clubbing or edema  Neuro:  AAOx2, no new findings  Psych: Appropriate    Data Reviewed: I have personally reviewed following labs and imaging studies  CBC:  Recent Labs Lab 08/13/16 2126 08/17/16 2344  WBC 9.1 6.5  NEUTROABS 6.7  --   HGB 9.1* 8.5*  HCT 29.4* 27.6*  MCV 91.3 89.6  PLT 305 494   Basic Metabolic Panel:  Recent Labs Lab 08/15/16 0333 08/16/16 0304 08/17/16 0351 08/17/16 0850 08/17/16 1612 08/17/16 2344 08/18/16 0326 08/18/16 0626 08/19/16 0607 08/20/16 0800  NA 135 137 135  --   --  130* 133*  --  132* 131*  K 2.6* 3.7 2.8*  --  4.1 2.9* 2.9*  --  3.2* 2.9*  CL 99* 101 96*  --   --  93* 94*  --  94* 96*  CO2 27 24 28   --   --  24 25  --  23 24  GLUCOSE 135* 103* 117*  --   --  104* 102*  --  108* 139*  BUN 5* 5* 7  --   --  9 9  --  11 11  CREATININE 0.72 0.78 0.76  --   --  0.67 0.76  --  0.85 0.70  CALCIUM 8.6* 8.9 8.8*  --   --  8.4* 8.9  --  9.1 8.5*  MG 2.1 1.9  --  1.8   --   --   --  1.8  --  1.7   GFR: Estimated Creatinine Clearance: 44.9 mL/min (by C-G formula based on SCr of 0.7 mg/dL). Liver Function Tests:  Recent Labs Lab 08/13/16 2126  AST 28  ALT 49  ALKPHOS 175*  BILITOT 0.6  PROT 6.6  ALBUMIN 2.9*   No results for input(s): LIPASE, AMYLASE in the last 168 hours. No results for input(s): AMMONIA in the last 168 hours. Coagulation Profile:  Recent Labs Lab 08/13/16 2126  INR 1.40   Cardiac Enzymes:  Recent Labs Lab 08/13/16 2126  TROPONINI 0.03*   BNP (last 3 results) No results for input(s): PROBNP in the last 8760 hours. HbA1C: No results for input(s): HGBA1C in the last 72 hours. CBG:  Recent Labs Lab 08/19/16 1948 08/19/16 2354 08/20/16 0426 08/20/16 0729 08/20/16 1125  GLUCAP 225* 120* 108* 123* 202*   Lipid Profile: No results for input(s): CHOL, HDL, LDLCALC, TRIG, CHOLHDL, LDLDIRECT in the last 72 hours. Thyroid Function Tests: No results for input(s): TSH, T4TOTAL, FREET4, T3FREE, THYROIDAB in the last 72 hours. Anemia Panel: No results for input(s): VITAMINB12, FOLATE, FERRITIN, TIBC, IRON, RETICCTPCT in the last 72 hours. Urine analysis:    Component Value Date/Time   COLORURINE YELLOW 08/09/2016 0055   APPEARANCEUR TURBID (A) 08/09/2016 0055   LABSPEC 1.014 08/09/2016 0055   PHURINE 5.0 08/09/2016 0055   GLUCOSEU NEGATIVE 08/09/2016 0055   HGBUR SMALL (A) 08/09/2016 0055   HGBUR negative 09/06/2009 1459   BILIRUBINUR NEGATIVE 08/09/2016 0055   BILIRUBINUR NEG 08/23/2010 1440   KETONESUR NEGATIVE 08/09/2016 0055   PROTEINUR 30 (A) 08/09/2016 0055   UROBILINOGEN 0.2 08/23/2010 1440   UROBILINOGEN 0.2 09/06/2009 1459   NITRITE NEGATIVE 08/09/2016 0055   LEUKOCYTESUR LARGE (A) 08/09/2016 0055   Sepsis Labs: @LABRCNTIP (procalcitonin:4,lacticidven:4)  ) Recent Results (from the past 240 hour(s))  MRSA PCR Screening     Status: None   Collection Time: 08/13/16 11:12 PM  Result Value Ref  Range Status   MRSA by PCR NEGATIVE NEGATIVE Final    Comment:  The GeneXpert MRSA Assay (FDA approved for NASAL specimens only), is one component of a comprehensive MRSA colonization surveillance program. It is not intended to diagnose MRSA infection nor to guide or monitor treatment for MRSA infections.       Radiology Studies: No results found.   Scheduled Meds: .  stroke: mapping our early stages of recovery book   Does not apply Once  . acetaminophen  650 mg Oral Q8H  . apixaban  5 mg Oral BID  . aspirin  81 mg Oral Daily  . chlorhexidine  15 mL Mouth Rinse BID  . diltiazem  90 mg Oral Q12H  . insulin aspart  0-9 Units Subcutaneous Q4H  . lidocaine  1 patch Transdermal Q24H  . mouth rinse  15 mL Mouth Rinse q12n4p  . metoprolol tartrate  50 mg Oral BID  . potassium chloride  40 mEq Oral BID  . predniSONE  5 mg Oral Q breakfast  . sodium citrate-citric acid  5 mL Oral TID   Continuous Infusions: . diltiazem (CARDIZEM) infusion 15 mg/hr (08/20/16 0707)  . magnesium sulfate 1 - 4 g bolus IVPB       LOS: 11 days   Time Spent in minutes   30 minutes  Faye Sanfilippo D.O. on 08/20/2016 at 1:58 PM  Between 7am to 7pm - Pager - [phone]  After 7pm go to [email or web address] - password [email or web address]  And look for the night coverage person covering for me after hours  [facility] Office  [phone]

## 2016-08-20 NOTE — Progress Notes (Signed)
Daily Progress Note   Patient Name: Tammy Beard       Date: 08/20/2016 DOB: 12/15/1940  Age: 76 y.o. MRN#: [id number] Attending Physician: Cristal Ford, DO Primary Care Physician: [name], MD Admit Date: 08/08/2016  Reason for Consultation/Follow-up: Establishing goals of care, Hospice Evaluation, Pain control and Psychosocial/spiritual support  Subjective: Patient somewhat lethargic, but tells me she just ate, she's at the hospital and she does not have any pain at the moment. I met with her brother Tammy Beard) who held a family discussion and completed the MOST form.   He expects her to return to [facility] with Hospice and comfort measures.   Assessment: Afib in the 140s - 150s.  No distress.  Does not seem to be effected by it.  Eating some full liquids. Immobile without total assist.  Mentally disabled but cooperative and able to answer simple questions.  Has stage 3 pressure injury to sacrum - complaining of pain yesterday.   Patient Profile/HPI:  76 y.o. female  with past medical history of polio, rheumatic fever, U/C, PMR, afib on Eliquis, CVA w residual left sided hemiparesis and neglect, recurrent UTIs, and a life time of being developmentally disabled, who was admitted on 08/08/2016 with aspiration pneumonia.  Her hospitalization has been complicated by afib with RVR, ileus, and an apparent neurological change (stroke rule out).    Length of Stay: 11  Current Medications: Scheduled Meds:  .  stroke: mapping our early stages of recovery book   Does not apply Once  . acetaminophen  650 mg Oral Q8H  . apixaban  5 mg Oral BID  . aspirin  81 mg Oral Daily  . chlorhexidine  15 mL Mouth Rinse BID  . insulin aspart  0-9 Units Subcutaneous Q4H  . lidocaine  1  patch Transdermal Q24H  . mouth rinse  15 mL Mouth Rinse q12n4p  . metoprolol tartrate  50 mg Oral BID  . predniSONE  5 mg Oral Q breakfast  . sodium citrate-citric acid  5 mL Oral TID    Continuous Infusions: . diltiazem (CARDIZEM) infusion 15 mg/hr (08/20/16 0707)    PRN Meds: LORazepam, metoprolol tartrate  Physical Exam        Lethargic.  But able to wake to answer 1 or 2 simple questions. CV irreg  irreg Resp no distress Abdomen soft, nt Left sided neglect  Vital Signs: BP (!) 120/107   Pulse (!) 59   Temp 97.9 F (36.6 C) (Axillary)   Resp (!) 25   Ht 5' 1"  (1.549 m)   Wt 47.5 kg (104 lb 12.8 oz)   SpO2 99%   BMI 19.80 kg/m  SpO2: SpO2: 99 % O2 Device: O2 Device: Not Delivered O2 Flow Rate: O2 Flow Rate (L/min): 1 L/min  Intake/output summary:  Intake/Output Summary (Last 24 hours) at 08/20/16 0913 Last data filed at 08/20/16 0428  Gross per 24 hour  Intake           345.71 ml  Output              900 ml  Net          -554.29 ml   LBM: Last BM Date: 08/19/16 Baseline Weight: Weight: 45.8 kg (101 lb) Most recent weight: Weight: 47.5 kg (104 lb 12.8 oz)       Palliative Assessment/Data:    Flowsheet Rows     Most Recent Value  Intake Tab  Referral Department  Hospitalist  Unit at Time of Referral  Med/Surg Unit  Palliative Care Primary Diagnosis  Neurology  Date Notified  08/19/16  Palliative Care Type  New Palliative care  Reason for referral  Pain, Non-pain Symptom, Clarify Goals of Care, Counsel Regarding Hospice  Date of Admission  08/09/16  Date first seen by Palliative Care  08/19/16  # of days Palliative referral response time  0 Day(s)  # of days IP prior to Palliative referral  10  Clinical Assessment  Palliative Performance Scale Score  30%  Anxiety Min Last 24 Hours  5  Psychosocial & Spiritual Assessment  Palliative Care Outcomes  Patient/Family meeting held?  Yes  Who was at the meeting?  HCPOA  Palliative Care Outcomes  Changed  to focus on comfort, Changed CPR status, Transitioned to hospice      Patient Active Problem List   Diagnosis Date Noted  . Aspiration into airway   . Palliative care encounter   . Goals of care, counseling/discussion   . Encounter for hospice care discussion   . Protein-calorie malnutrition, severe 08/14/2016  . Pressure injury of skin 08/10/2016  . Aspiration pneumonia ([place]) 08/09/2016  . Acute metabolic encephalopathy 75/12/2583  . Severe sepsis ([place]) 08/09/2016  . Paronychia of great toe, left 07/29/2016  . History of rib fracture 07/25/2016  . Vascular dementia 07/18/2016  . Weight loss 07/14/2016  . Pressure ulcer of buttock 06/17/2016  . Constipation 06/17/2016  . Chronic anticoagulation 06/10/2016  . Syncope 04/01/2016  . Psychosis 04/01/2016  . Dysphagia, pharyngoesophageal phase 04/01/2016  . Paroxysmal atrial fibrillation (H[facility])   . Acute ischemic right MCA stroke ([place]) 03/24/2016  . Cerebrovascular accident (CVA) due to embolism of cerebral artery ([place]) 03/24/2016  . Left hemiparesis ([place]) 03/23/2016  . Hypokalemia 03/23/2016  . Anemia 03/20/2016  . Traumatic closed nondisplaced fracture of distal end of right radius 03/10/2016  . UTI (urinary tract infection) 02/22/2016  . Intracranial vascular stenosis 02/07/2016  . CVA, old, disturbances of vision 12/20/2015  . Abnormality of gait 11/27/2015  . Citrobacter infection   . GERD (gastroesophageal reflux disease) 11/22/2015  . Malaise 11/22/2015  . Hyponatremia 11/22/2015  . Ecchymosis 11/15/2015  . Palpitations 11/02/2015  . Atrial tachycardia ([place]) 09/25/2015  . History of stroke   . Disequilibrium   . Dizzy 09/18/2015  . History of poliomyelitis  01/23/2015  . Diastasis recti 01/23/2015  . DM (diabetes mellitus), type 2, uncontrolled w/neurologic complication ([place]) 88/45/7334  . Heart murmur 03/07/2014  . Polymyalgia rheumatica ([place]) 12/08/2013  . Disturbance in sleep behavior 06/28/2009  . Urinary  frequency 06/28/2009  . Dyslipidemia 11/09/2008  . Essential hypertension 06/18/2007  . ALLERGIC RHINITIS 06/18/2007  . Ulcerative colitis ([place]) 06/08/2007  . OSTEOPENIA 06/08/2007  . MURMUR 06/08/2007    Palliative Care Plan    Recommendations/Plan:  Return to [facility] when medically ready  Comfort Measures Only  To be followed by Hospice  MOST form completed:    CMO   Antibiotics - yes  IVF - no  Feeding tube - no  DNR completed and on chart  Wound care needed for sacral area - patient will need frequent turning/repositioning.   Goals of Care and Additional Recommendations:  Limitations on Scope of Treatment: Full Comfort Care  Code Status:  DNR  Prognosis:   < 6 months - possibly much less given recurrent infections (UTI and aspiration pneumonia) and propensity to dehydrated due to significant esophageal issues (spasms, zenkers, presumed dysmotility)    Discharge Planning:  [place] with Hospice    Care plan was discussed with HCPOA and bedside RN  Thank you for allowing the Palliative Medicine Team to assist in the care of this patient.  Total time spent:  35 min.     Greater than 50%  of this time was spent counseling and coordinating care related to the above assessment and plan.  Imogene Burn, PA-C Palliative Medicine  Please contact Palliative MedicineTeam phone at [phone] for questions and concerns between 7 am - 7 pm.   Please see AMION for individual provider pager numbers.

## 2016-08-21 DIAGNOSIS — E43 Unspecified severe protein-calorie malnutrition: Secondary | ICD-10-CM

## 2016-08-21 DIAGNOSIS — T17908A Unspecified foreign body in respiratory tract, part unspecified causing other injury, initial encounter: Secondary | ICD-10-CM

## 2016-08-21 LAB — BASIC METABOLIC PANEL
Anion gap: 12 (ref 5–15)
BUN: 12 mg/dL (ref 4–21)
BUN: 12 mg/dL (ref 6–20)
CALCIUM: 8.7 mg/dL — AB (ref 8.9–10.3)
CO2: 24 mmol/L (ref 22–32)
CREATININE: 0.73 mg/dL (ref 0.44–1.00)
Chloride: 97 mmol/L — ABNORMAL LOW (ref 101–111)
Creatinine: 0.7 mg/dL (ref 0.5–1.1)
GLUCOSE: 111 mg/dL — AB (ref 65–99)
Glucose: 111 mg/dL
Potassium: 4.1 mmol/L (ref 3.5–5.1)
SODIUM: 133 mmol/L — AB (ref 137–147)
Sodium: 133 mmol/L — ABNORMAL LOW (ref 135–145)

## 2016-08-21 LAB — GLUCOSE, CAPILLARY
GLUCOSE-CAPILLARY: 126 mg/dL — AB (ref 65–99)
Glucose-Capillary: 101 mg/dL — ABNORMAL HIGH (ref 65–99)
Glucose-Capillary: 123 mg/dL — ABNORMAL HIGH (ref 65–99)
Glucose-Capillary: 211 mg/dL — ABNORMAL HIGH (ref 65–99)

## 2016-08-21 LAB — MAGNESIUM: MAGNESIUM: 2.5 mg/dL — AB (ref 1.7–2.4)

## 2016-08-21 MED ORDER — DILTIAZEM HCL 30 MG PO TABS: 30.0000 mg | ORAL_TABLET | Freq: Two times a day (BID) | ORAL | 0 refills | Status: DC | PRN

## 2016-08-21 MED ORDER — LORAZEPAM 2 MG/ML PO CONC: 0.2500 mg | Freq: Three times a day (TID) | ORAL | 0 refills | Status: DC | PRN

## 2016-08-21 MED ORDER — DILTIAZEM HCL 60 MG PO TABS: 60.0000 mg | ORAL_TABLET | Freq: Three times a day (TID) | ORAL | 0 refills | Status: DC

## 2016-08-21 MED ORDER — DILTIAZEM HCL 30 MG PO TABS: 30.0000 mg | ORAL_TABLET | Freq: Once | ORAL | Status: DC

## 2016-08-21 MED ORDER — METOPROLOL TARTRATE 50 MG PO TABS: 50.0000 mg | ORAL_TABLET | Freq: Two times a day (BID) | ORAL | 0 refills | Status: DC

## 2016-08-21 NOTE — Discharge Summary (Addendum)
Physician Discharge Summary  Tammy Beard:[id number] DOB: 1940-04-26 DOA: 08/08/2016  PCP: Tammy Dooms, MD  Admit date: 08/08/2016 Discharge date: 08/21/2016  Time spent: 45 minutes  Recommendations for Outpatient Follow-up:  Patient will be discharged to skilled nursing facility with hospice to follow.  Follow up with primary care provider within one week of discharge.  Patient should continue medications as prescribed.  Patient should follow Beard full liquid diet with aspiration precautions.  Discharge Diagnoses:  Severe sepsis secondary to aspiration pneumonia Nonsustained V. Tach Paroxysmal atrial fibrillation Acute metabolic encephalopathy Ileus Essential hypertension Polymyalgia rheumatica Diabetes mellitus, type II Hypokalemia Stroke Vascular dementia Goals of care  Discharge Condition: Stable   Diet recommendation: Full liquid  Filed Weights   08/18/16 0355 08/20/16 0424 08/21/16 0425  Weight: 46.4 kg (102 lb 3.2 oz) 47.5 kg (104 lb 12.8 oz) 46.1 kg (101 lb 9.6 oz)    History of present illness:  on 08/09/2016 by Dr. 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 The patient is hard of hearing at baseline with cognitive impairment. Her brother reports that she was last known well two days ago; he visited her and she seemed behaving like her normal self. By the next day, she was demonstrating increased confusion and told him "I don't feel well". She has Beard new wet cough and tachycardia. SNF personnel was concerned that she was in atrial fibrillation, so she was sent to the ED for further evaluation.  Hospital Course:    Severe sepsis secondary to aspiration pneumonia -Patient did have swallow evaluation showing the severe dysphagia, family made aware -Initially placed on Zosyn and transitioned to Unasyn. Appears patient has completed her antibiotic course during hospitalization -Family aware of patient's aspiration risk and would like to continue feedings. Diet was advanced to full liquid diet.  Nonsustained V. Tach -Likely secondary to hypokalemia -Appears to be asymptomatic -Potassium replaced, currently 4.1 -Echocardiogram done on 08/16/2016: 60-65%, wall motion was normal  Paroxysmal atrial fibrillation -CHADSVASC at least 4 -Continue Eliquis, metoprolol (increased to 50mg  BID) -Weaned off of cardizem drip -Changed cardizem to 60mg  TID to space out dose- hopefully for better control of HR. -Have prescribed additional 30mg  cardizem as needed twice daily for HR >120.  Acute metabolic encephalopathy -Likely secondary to sepsis, patient does appear to be at baseline  Ileus -Resolved, tolerating liquid diet  Essential hypertension -Continue Cardizem, metoprolol (increased doses of medications)  Polymyalgia rheumatica -Continue prednisone  Diabetes mellitus, type II -Was placed on insulin sliding scale during hospitalization -Hemoglobin A1c 5.8 -On no medications at home, suspect diet controlled.  Hypokalemia -Potassium replaced, currently 4.1 -Magnesium replaced, currently 2.5  Stroke -With left-sided hemiparesis -CT head without contrast shows no acute intracranial abnormalities, on 5/19 as well as 08/13/2016 -Neurology consulted and appreciated. During hospitalization, it seems that patient did have Beard code stroke due to her decline in mentation. -MRI on 08/16/2016 showed tiny right occipital and left temporal lobe acute versus subacute infarcts. Old right parietal/MCA territory infarct. -Neurology felt these infarcts likely to be embolic secondary to atrial fibrillation.  Recommending continuing Eliquis -LDL 78 -hemoglobin A1c 5.8 -Continue physical therapy, aspirin, Eliquis  Severe malnutrition -Likely due to aspiration/dysphagia  Vascular dementia -Appears  stable  Goals of care -Palliative care consulted and appreciated. Patient does have several comorbidities. Patient currently DO NOT RESUSCITATE. Plan for patient to be discharged to skilled nursing facility with hospice to follow.  Procedures: Echocardiogram  Consultations: Neurology Palliative care  Discharge Exam: Vitals:   08/21/16 0429 08/21/16 0749  BP: (!) 131/101   Pulse:  (!) 123  Resp:  18  Temp:  97.6 F (36.4 C)   Patient has no complaints this morning. Denies chest pain or shortness of breath.    General: Well developed, well nourished, NAD, appears stated age  11: NCAT, mucous membranes moist.  Cardiovascular: S1 S2 auscultated, irregularly irregular  Respiratory: Clear to auscultation bilaterally with equal chest rise  Abdomen: Soft, nontender, nondistended, + bowel sounds  Extremities: warm dry without cyanosis clubbing or edema  Neuro: AAOx2, no new findings, falls asleep easily   Psych: Appropriate  Discharge Instructions Discharge Instructions    Discharge instructions    Complete by:  As directed    Patient will be discharged to skilled nursing facility with hospice to follow.  Follow up with primary care provider within one week of discharge.  Patient should continue medications as prescribed.  Patient should follow Beard full liquid diet with aspiration precautions.     Current Discharge Medication List    START taking these medications   Details  LORazepam (ATIVAN) 2 MG/ML concentrated solution Take 0.1 mLs (0.2 mg total) by mouth every 8 (eight) hours as needed for anxiety or seizure (or panic or shortness of breath due to panic). Qty: 30 mL, Refills: 0      CONTINUE these medications which have CHANGED   Details  !! diltiazem (CARDIZEM) 30  MG tablet Take 1 tablet (30 mg total) by mouth 2 (two) times daily as needed (for HR >120). Qty: 60 tablet, Refills: 0    !! diltiazem (CARDIZEM) 60 MG tablet Take 1 tablet (60 mg total) by mouth 3 (three) times daily. Qty: 90 tablet, Refills: 0    metoprolol tartrate (LOPRESSOR) 50 MG tablet Take 1 tablet (50 mg total) by mouth 2 (two) times daily. Qty: 60 tablet, Refills: 0     !! - Potential duplicate medications found. Please discuss with provider.    CONTINUE these medications which have NOT CHANGED   Details  acetaminophen (TYLENOL) 325 MG tablet Take 650 mg by mouth every 6 (six) hours as needed for mild pain or moderate pain.     apixaban (ELIQUIS) 5 MG TABS tablet Take 1 tablet (5 mg total) by mouth 2 (two) times daily. Qty: 60 tablet    aspirin 81 MG chewable tablet Chew 81 mg by mouth daily.    Cranberry-Vitamin C-Inulin (UTI-STAT) LIQD Take 30 mLs by mouth 2 (two) times daily.    divalproex (DEPAKOTE SPRINKLE) 125 MG capsule Take 125 mg by mouth daily.    ferrous sulfate 325 (65 FE) MG EC tablet Take 325 mg by mouth every evening.    mirabegron ER (MYRBETRIQ) 25 MG TB24 tablet Take 25 mg by mouth daily.    potassium chloride SA (K-DUR,KLOR-CON) 20 MEQ tablet Take 20 mEq by mouth daily.     predniSONE (DELTASONE) 5 MG tablet ONE TABLETs DAILY TO TREAT POLYMYALGIA RHEUMATICA   Associated Diagnoses: Polymyalgia rheumatica (HCC)    ranitidine (ZANTAC) 150 MG tablet Take 150 mg by mouth at bedtime. Take one tablet at bedtime     senna (SENOKOT) 8.6 MG tablet Take 1 tablet by mouth daily.  zinc oxide 20 % ointment Apply 1 application topically as needed for irritation.      STOP taking these medications     trimethoprim (TRIMPEX) 100 MG tablet        No Known Allergies Follow-up Information    Tammy Dooms, MD. Schedule an appointment as soon as possible for Beard visit in 1 week(s).   Specialty:  Internal Medicine Why:  Hospital follow up Contact  information: [address] [place] [address] [phone]            The results of significant diagnostics from this hospitalization (including imaging, microbiology, ancillary and laboratory) are listed below for reference.    Significant Diagnostic Studies: Ct Angio Head W/cm &/or Wo Cm  Result Date: 08/16/2016 CLINICAL DATA:  History of CVA with LEFT-sided weakness. Atrial fibrillation. Diabetes. EXAM: CT ANGIOGRAPHY HEAD AND NECK TECHNIQUE: Multidetector CT imaging of the head and neck was performed using the standard protocol during bolus administration of intravenous contrast. Multiplanar CT image reconstructions and MIPs were obtained to evaluate the vascular anatomy. Carotid stenosis measurements (when applicable) are obtained utilizing NASCET criteria, using the distal internal carotid diameter as the denominator. CONTRAST:  50 mL Isovue 370. COMPARISON:  MR brain 08/16/2016. CT head 08/13/2016. CTA head neck 09/24/2015. FINDINGS: CT HEAD Calvarium and skull base: No fracture or destructive lesion. Mastoids and middle ears are grossly clear. Paranasal sinuses: Imaged portions are clear. Orbits: Negative. Brain: No evidence of acute abnormality, including acute infarct, hemorrhage, hydrocephalus, or mass lesion. Atrophy. Chronic microvascular ischemic change. Subacute to chronic RIGHT parietal infarct, suspected watershed distribution. CTA NECK Aortic arch: Standard branching. Imaged portion shows no evidence of aneurysm or dissection. No significant stenosis of the major arch vessel origins. Right carotid system: Minor atheromatous change the bifurcation. No evidence of dissection, stenosis (50% or greater) or occlusion. Left carotid system: Minor atheromatous change at the bifurcation. No evidence of dissection, stenosis (50% or greater) or occlusion. Vertebral arteries: Codominant. No evidence of dissection, stenosis (50% or greater) or occlusion. Nonvascular soft tissues:  Biapical lung opacities, greater on the LEFT, with moderate-sized effusions. Pneumonia not excluded. No neck masses. No adenopathy. Spondylosis. CTA HEAD Anterior circulation: Severe calcified LEFT ICA terminus stenosis. Moderate LEFT ICA cavernous stenosis. High-grade RIGHT ICA genu stenosis. LEFT M1 MCA is moderately irregular but patent. LEFT M1 stenosis in its midportion estimated 50%. Patent LEFT MCA branches. Severe distal RIGHT M1 MCA stenosis, estimated 75% or greater. No M2 MCA branch occlusion. No aneurysm, or vascular malformation. Posterior circulation: Unremarkable. No significant stenosis, proximal occlusion, aneurysm, or vascular malformation. Venous sinuses: As permitted by contrast timing, patent. Anatomic variants: None of significance. Delayed phase:   No abnormal intracranial enhancement. IMPRESSION: No extracranial stenosis of significance. 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 Subacute to chronic RIGHT parietal cortical and subcortical infarct, possible watershed insult. Electronically Signed   By: Staci Righter M.D.   On: 08/16/2016 15:48   Dg Chest 2 View  Result Date: 08/08/2016 CLINICAL DATA:  Initial evaluation for atrial fibrillation. History of hypertension, murmur. EXAM: CHEST  2 VIEW COMPARISON:  Prior radiograph from 07/25/2016. FINDINGS: Stable cardiomegaly. Mediastinal silhouette within normal limits. Aortic atherosclerosis. Chronic lung changes with emphysema and scarring. Small bilateral pleural effusions, greater on the left. Patchy bibasilar opacities, which may reflect atelectasis or infiltrates. No overt pulmonary edema. No pneumothorax. Irregular pleural thickening at the right lung apex is stable. No acute  osseus abnormality. Exaggeration of the normal thoracic kyphosis, similar to previous. Subacute to chronic right-sided rib fractures. Osteopenia. IMPRESSION: 1. Small bilateral pleural effusions,  left slightly larger than right. Mild patchy bibasilar opacities may reflect atelectasis or possibly infiltrates. 2. Stable cardiomegaly without pulmonary edema. 3. Aortic atherosclerosis. Electronically Signed   By: Jeannine Boga M.D.   On: 08/08/2016 23:05   Dg Ribs Bilateral W/chest  Result Date: 07/25/2016 CLINICAL DATA:  Bilateral rib and low back pain for 1 week. EXAM: BILATERAL RIBS AND CHEST - 4+ VIEW COMPARISON:  Chest radiograph 03/27/2016 FINDINGS: Healed posterior right seventh and 10 rib fractures. Probably acute nondisplaced left tenth posterior rib fracture. No evidence of pneumothorax. Cardiomediastinal silhouette is mildly enlarged. Calcific atherosclerotic disease of the aorta seen. No evidence of focal consolidation. IMPRESSION: Probably acute nondisplaced left tenth posterior rib fracture. Electronically Signed   By: Fidela Salisbury M.D.   On: 07/25/2016 12:12   Dg Abd 1 View  Result Date: 08/13/2016 CLINICAL DATA:  Followup ileus. EXAM: ABDOMEN - 1 VIEW COMPARISON:  Earlier today FINDINGS: Unchanged diffuse gas dilatation of small and large bowel. Bladder shadow noted; no concerning mass effect. No convincing extraluminal gas. IMPRESSION: History of ileus with unchanged gaseous distension of small and large bowel. No change compared to earlier today. Electronically Signed   By: Monte Fantasia M.D.   On: 08/13/2016 14:35   Ct Head Wo Contrast  Result Date: 08/13/2016 CLINICAL DATA:  76 y/o F; aphasia and left-sided symptoms with history of stroke. Interval mental status change. EXAM: CT HEAD WITHOUT CONTRAST TECHNIQUE: Contiguous axial images were obtained from the base of the skull through the vertex without intravenous contrast. COMPARISON:  08/09/2016 CT of the head. FINDINGS: Brain: No large acute territory infarct or intracranial hemorrhage identified. Chronic right frontal parietal region infarction is unchanged from prior CT of head. Background of mild to moderate  chronic microvascular ischemic changes in white matter in parenchymal volume loss. 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 Vascular: Mild calcific atherosclerosis of carotid siphons. No hyperdense vessel identified. Skull: Normal. Negative for fracture or focal lesion. Sinuses/Orbits: Partial bilateral mastoid opacification. Visualized paranasal sinuses are clear. Underpneumatized frontal sinuses. Bilateral intra-ocular lens replacement. Other: None. IMPRESSION: 1. No acute intracranial abnormality identified. 2. Stable appearance of right frontal parietal region chronic infarction, moderate chronic microvascular ischemic changes of white matter, and parenchymal volume loss of the brain. These results were called by telephone at the time of interpretation on 08/13/2016 at 8:41 pm to Dr. Clance Boll , who verbally acknowledged these results. Electronically Signed   By: Kristine Garbe M.D.   On: 08/13/2016 20:44   Ct Head Wo Contrast  Result Date: 08/09/2016 CLINICAL DATA:  Altered mental status for 1 day. EXAM: CT HEAD WITHOUT CONTRAST TECHNIQUE: Contiguous axial images were obtained from the base of the skull through the vertex without intravenous contrast. COMPARISON:  Head CT 03/27/2016. FINDINGS: Brain: Stable degree of atrophy and chronic small vessel ischemia. Stable appearance of remote infarct in post right parietal lobe. Mega cisterna magna incidentally noted. No hemorrhage or evidence of acute ischemia. No mass effect or midline shift. Vascular: No hyperdense vessel or unexpected calcification. Skull: Normal. Negative for fracture or focal lesion. Sinuses/Orbits: Unchanged opacification of lower bilateral mastoid air cells. Paranasal sinuses are well-aerated. Post bilateral cataract resection. Other: None. IMPRESSION: Stable degree of atrophy, chronic and remote ischemia. No CT findings of acute intracranial abnormality. Electronically Signed    By: Jeb Levering  M.D.   On: 08/09/2016 00:33   Ct Angio Neck W/cm &/or Wo/cm  Result Date: 08/16/2016 CLINICAL DATA:  History of CVA with LEFT-sided weakness. Atrial fibrillation. Diabetes. EXAM: CT ANGIOGRAPHY HEAD AND NECK TECHNIQUE: Multidetector CT imaging of the head and neck was performed using the standard protocol during bolus administration of intravenous contrast. Multiplanar CT image reconstructions and MIPs were obtained to evaluate the vascular anatomy. Carotid stenosis measurements (when applicable) are obtained utilizing NASCET criteria, using the distal internal carotid diameter as the denominator. CONTRAST:  50 mL Isovue 370. COMPARISON:  MR brain 08/16/2016. CT head 08/13/2016. CTA head neck 09/24/2015. FINDINGS: CT HEAD Calvarium and skull base: No fracture or destructive lesion. Mastoids and middle ears are grossly clear. Paranasal sinuses: Imaged portions are clear. Orbits: Negative. Brain: No evidence of acute abnormality, including acute infarct, hemorrhage, hydrocephalus, or mass lesion. Atrophy. Chronic microvascular ischemic change. Subacute to chronic RIGHT parietal infarct, suspected watershed distribution. CTA NECK Aortic arch: Standard branching. Imaged portion shows no evidence of aneurysm or dissection. No significant stenosis of the major arch vessel origins. Right carotid system: Minor atheromatous change the bifurcation. No evidence of dissection, stenosis (50% or greater) or occlusion. Left carotid system: Minor atheromatous change at the bifurcation. No evidence of dissection, stenosis (50% or greater) or occlusion. Vertebral arteries: Codominant. No evidence of dissection, stenosis (50% or greater) or occlusion. Nonvascular soft tissues: Biapical lung opacities, greater on the LEFT, with moderate-sized effusions. Pneumonia not excluded. No neck masses. No adenopathy. Spondylosis. CTA HEAD Anterior circulation: Severe calcified LEFT ICA terminus stenosis. Moderate LEFT  ICA cavernous stenosis. High-grade RIGHT ICA genu stenosis. LEFT M1 MCA is moderately irregular but patent. LEFT M1 stenosis in its midportion estimated 50%. Patent LEFT MCA branches. Severe distal RIGHT M1 MCA stenosis, estimated 75% or greater. No M2 MCA branch occlusion. No aneurysm, or vascular malformation. Posterior circulation: Unremarkable. No significant stenosis, proximal occlusion, aneurysm, or vascular malformation. Venous sinuses: As permitted by contrast timing, patent. Anatomic variants: None of significance. Delayed phase:   No abnormal intracranial enhancement. IMPRESSION: No extracranial stenosis of significance. 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 Subacute to chronic RIGHT parietal cortical and subcortical infarct, possible watershed insult. Electronically Signed   By: Staci Righter M.D.   On: 08/16/2016 15:48   Mr Brain Wo Contrast  Result Date: 08/16/2016 CLINICAL DATA:  Altered mental status, possible stroke, LEFT-sided weakness. History of stroke, hypertension, polio, hyperlipidemia. EXAM: MRI HEAD WITHOUT CONTRAST TECHNIQUE: Multiplanar, multiecho pulse sequences of the brain and surrounding structures were obtained without intravenous contrast. COMPARISON:  CT HEAD Aug 13, 2016 and MRI of the head March 23, 2016 FINDINGS: Mild to moderately motion degraded examination. BRAIN: RIGHT occipital and LEFT temporal lobe periventricular foci of reduced diffusion measuring to 4 mm, not identifiable on ADC map. Faint reduced diffusion with normalized ADC values corresponding to RIGHT parietal lobe infarct. Mild ex vacuo dilatation RIGHT lateral ventricle, no hydrocephalus. Moderate to severe ventriculomegaly on the basis of global parenchymal brain volume loss, progressed from prior imaging. Mega cisterna magna. Severe vermian atrophy. Patchy to confluent supratentorial and pontine white matter FLAIR T2  hyperintensities. No midline shift, mass effect or masses. No abnormal extra-axial fluid collections. VASCULAR: Normal major intracranial vascular flow voids present at skull base. SKULL AND UPPER CERVICAL SPINE: Partially empty sella. No suspicious calvarial bone marrow signal. Craniocervical junction maintained. SINUSES/ORBITS: Bilateral mastoid effusions. Paranasal sinuses are well-aerated. The included ocular globes  and orbital contents are non-suspicious. Status post bilateral ocular lens implants. OTHER: None. IMPRESSION: Mildly motion degraded examination. Tiny RIGHT occipital and LEFT temporal lobe acute versus subacute infarcts. Old RIGHT parietal/ MCA territory infarct. Severe vermian atrophy in Beard background of moderate to severe parenchymal brain volume loss. Moderate chronic small vessel ischemic disease. Electronically Signed   By: Elon Alas M.D.   On: 08/16/2016 06:54   Dg Abd Portable 1v  Result Date: 08/13/2016 CLINICAL DATA:  Ileus. EXAM: PORTABLE ABDOMEN - 1 VIEW COMPARISON:  08/12/2016. FINDINGS: Persistent distended loops of small and large bowel noted. Findings consist with mild adynamic ileus. No free air. No acute bony abnormality. Degenerative changes lumbar spine and both hips. Diffuse osteopenia. Small bilateral pleural effusions . IMPRESSION: 1. Persistent distended loops of small and large bowel noted consistent with adynamic ileus. 2. Small bilateral pleural effusions. Electronically Signed   By: Marcello Moores  Register   On: 08/13/2016 07:18   Dg Abd Portable 1v  Result Date: 08/12/2016 CLINICAL DATA:  Abdominal distention and constipation for the past few days. EXAM: PORTABLE ABDOMEN - 1 VIEW COMPARISON:  Two views of the abdomen 06/29/2003. FINDINGS: Moderate and diffuse gaseous distention of small and large bowel is identified. Stool burden is unremarkable. No abnormal abdominal calcification or focal bony abnormality. IMPRESSION: Bowel gas pattern most compatible with  ileus. Electronically Signed   By: Inge Rise M.D.   On: 08/12/2016 12:08    Microbiology: Recent Results (from the past 240 hour(s))  MRSA PCR Screening     Status: None   Collection Time: 08/13/16 11:12 PM  Result Value Ref Range Status   MRSA by PCR NEGATIVE NEGATIVE Final    Comment:        The GeneXpert MRSA Assay (FDA approved for NASAL specimens only), is one component of Beard comprehensive MRSA colonization surveillance program. It is not intended to diagnose MRSA infection nor to guide or monitor treatment for MRSA infections.      Labs: Basic Metabolic Panel:  Recent Labs Lab 08/16/16 0304  08/17/16 9735  08/17/16 2344 08/18/16 0326 08/18/16 3299 08/19/16 0607 08/20/16 0800 08/21/16 0334  NA 137  < >  --   --  130* 133*  --  132* 131* 133*  K 3.7  < >  --   < > 2.9* 2.9*  --  3.2* 2.9* 4.1  CL 101  < >  --   --  93* 94*  --  94* 96* 97*  CO2 24  < >  --   --  24 25  --  23 24 24   GLUCOSE 103*  < >  --   --  104* 102*  --  108* 139* 111*  BUN 5*  < >  --   --  9 9  --  11 11 12   CREATININE 0.78  < >  --   --  0.67 0.76  --  0.85 0.70 0.73  CALCIUM 8.9  < >  --   --  8.4* 8.9  --  9.1 8.5* 8.7*  MG 1.9  --  1.8  --   --   --  1.8  --  1.7 2.5*  < > = values in this interval not displayed. Liver Function Tests: No results for input(s): AST, ALT, ALKPHOS, BILITOT, PROT, ALBUMIN in the last 168 hours. No results for input(s): LIPASE, AMYLASE in the last 168 hours. No results for input(s): AMMONIA in the last 168 hours. CBC:  Recent Labs Lab 08/17/16 2344  WBC 6.5  HGB 8.5*  HCT 27.6*  MCV 89.6  PLT 301   Cardiac Enzymes: No results for input(s): CKTOTAL, CKMB, CKMBINDEX, TROPONINI in the last 168 hours. BNP: BNP (last 3 results) No results for input(s): BNP in the last 8760 hours.  ProBNP (last 3 results) No results for input(s): PROBNP in the last 8760 hours.  CBG:  Recent Labs Lab 08/20/16 1637 08/20/16 2000 08/21/16 0012  08/21/16 0425 08/21/16 0739  GLUCAP 181* 174* 101* 123* 126*       Signed:  Cristal Ford  [facility] 08/21/2016, 11:30 AM

## 2016-08-21 NOTE — Progress Notes (Signed)
Daily Progress Note   Patient Name: Tammy Beard       Date: 08/21/2016 DOB: 1940/05/25  Age: 76 y.o. MRN#: [id number] Attending Physician: No att. providers found Primary Care Physician: Tammy Dooms, MD Admit Date: 08/08/2016  Reason for Consultation/Follow-up: Non pain symptom management, Pain control and Psychosocial/spiritual support  Subjective: I checked on Tammy Beard this morning.  She was very sleepy and would do little more than grunt at me.  I noticed she had eaten a bowl of grits.  Her RN stated she had be very interactive this morning.   Assessment: Afib now in the 120s.  Eating full liquids.  Total assist with transfers and ADLs.  Mentally disabled but usually able to answer simple questions about herself.  Painful Stg 3 pressure injury to sacrum.  As medically optimized as possible and ready for d/c to SNF with Hospice services.   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 Her hospitalization has been complicated by afib with RVR, ileus, and an apparent neurological change (stroke rule out).   Length of Stay: 12  Current Medications: Scheduled Meds:  .  stroke: mapping our early stages of recovery book   Does not apply Once  . acetaminophen  650 mg Oral Q8H  . apixaban  5 mg Oral BID  . aspirin  81 mg Oral Daily  . chlorhexidine  15 mL Mouth Rinse BID  . diltiazem  30 mg Oral Once  . diltiazem  90 mg Oral Q12H  . insulin aspart  0-9 Units Subcutaneous Q4H  . lidocaine  1 patch Transdermal Q24H  . mouth rinse  15 mL  Mouth Rinse q12n4p  . metoprolol tartrate  50 mg Oral BID  . predniSONE  5 mg Oral Q breakfast  . sodium citrate-citric acid  5 mL Oral TID    Continuous Infusions:   PRN Meds: LORazepam, metoprolol tartrate  Physical Exam       Sleepy mentally disabled female - too sleepy to speak with me. CV irreg irreg resp no distress Abdomen soft.  Vital Signs: BP 125/70 (BP Location: Left Arm)   Pulse Marland Kitchen)  103   Temp 97.6 F (36.4 C) (Oral)   Resp 18   Ht 5\' 1"  (1.549 m)   Wt 46.1 kg (101 lb 9.6 oz)   SpO2 97%   BMI 19.20 kg/m  SpO2: SpO2: 97 % O2 Device: O2 Device: Nasal Cannula O2 Flow Rate: O2 Flow Rate (L/min): 1 L/min  Intake/output summary:  Intake/Output Summary (Last 24 hours) at 08/21/16 1449 Last data filed at 08/21/16 1300  Gross per 24 hour  Intake              730 ml  Output             1500 ml  Net             -770 ml   LBM: Last BM Date: 08/20/16 Baseline Weight: Weight: 45.8 kg (101 lb) Most recent weight: Weight: 46.1 kg (101 lb 9.6 oz)       Palliative Assessment/Data:    Flowsheet Rows     Most Recent Value  Intake Tab  Referral Department  Hospitalist  Unit at Time of Referral  Med/Surg Unit  Palliative Care Primary Diagnosis  Neurology  Date Notified  08/19/16  Palliative Care Type  New Palliative care  Reason for referral  Pain, Non-pain Symptom, Clarify Goals of Care, Counsel Regarding Hospice  Date of Admission  08/09/16  Date first seen by Palliative Care  08/19/16  # of days Palliative referral response time  0 Day(s)  # of days IP prior to Palliative referral  10  Clinical Assessment  Palliative Performance Scale Score  30%  Anxiety Min Last 24 Hours  5  Psychosocial & Spiritual Assessment  Palliative Care Outcomes  Patient/Family meeting held?  Yes  Who was at the meeting?  HCPOA  Palliative Care Outcomes  Changed to focus on comfort, Changed CPR status, Transitioned to hospice      Patient Active Problem List   Diagnosis Date  Noted  . HCAP (healthcare-associated pneumonia)   . Aspiration into airway   . Palliative care encounter   . Goals of care, counseling/discussion   . Encounter for hospice care discussion   . Protein-calorie malnutrition, severe 08/14/2016  . Pressure injury of skin 08/10/2016  . Aspiration pneumonia ([place]) 08/09/2016  . Acute metabolic encephalopathy 69/62/9528  . Severe sepsis ([place]) 08/09/2016  . Paronychia of great toe, left 07/29/2016  . History of rib fracture 07/25/2016  . Vascular dementia 07/18/2016  . Weight loss 07/14/2016  . Pressure ulcer of buttock 06/17/2016  . Constipation 06/17/2016  . Chronic anticoagulation 06/10/2016  . Syncope 04/01/2016  . Psychosis 04/01/2016  . Dysphagia, pharyngoesophageal phase 04/01/2016  . Paroxysmal atrial fibrillation (HCC)   . Acute ischemic right MCA stroke ([place]) 03/24/2016  . Cerebrovascular accident (CVA) due to embolism of cerebral artery ([place]) 03/24/2016  . Left hemiparesis ([place]) 03/23/2016  . Hypokalemia 03/23/2016  . Anemia 03/20/2016  . Traumatic closed nondisplaced fracture of distal end of right radius 03/10/2016  . UTI (urinary tract infection) 02/22/2016  . Intracranial vascular stenosis 02/07/2016  . CVA, old, disturbances of vision 12/20/2015  . Abnormality of gait 11/27/2015  . Citrobacter infection   . GERD (gastroesophageal reflux disease) 11/22/2015  . Malaise 11/22/2015  . Hyponatremia 11/22/2015  . Ecchymosis 11/15/2015  . Palpitations 11/02/2015  . Atrial tachycardia ([place]) 09/25/2015  . History of stroke   . Disequilibrium   . Dizzy 09/18/2015  . 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 Diastasis recti 01/23/2015  . DM (diabetes  mellitus), type 2, uncontrolled w/neurologic complication ([place]) 57/32/2025  . Heart murmur 03/07/2014  . Polymyalgia rheumatica ([place]) 12/08/2013  . Disturbance in sleep behavior 06/28/2009  . Urinary frequency 06/28/2009  . Dyslipidemia 11/09/2008  . Essential hypertension  06/18/2007  . ALLERGIC RHINITIS 06/18/2007  . Ulcerative colitis ([place]) 06/08/2007  . OSTEOPENIA 06/08/2007  . MURMUR 06/08/2007    Palliative Care Plan    Recommendations/Plan:  D/c to SNF with full comfort and hospice.  Goals of Care and Additional Recommendations:  Limitations on Scope of Treatment: Avoid Hospitalization, Full Comfort Care and Minimize Medications  Code Status:  DNR  Prognosis:   < 6 months   Discharge Planning:  [place] with Hospice  Care plan was discussed with bedside RN  Thank you for allowing the Palliative Medicine Team to assist in the care of this patient.  Total time spent:  15 m     Greater than 50%  of this time was spent counseling and coordinating care related to the above assessment and plan.  Imogene Burn, PA-C Palliative Medicine  Please contact Palliative MedicineTeam phone at [phone] for questions and concerns between 7 am - 7 pm.   Please see AMION for individual provider pager numbers.

## 2016-08-21 NOTE — Discharge Instructions (Signed)
Aspiration Precautions, Adult Aspiration is the breathing in (inhalation) of a liquid or object into the lungs. Things that can be inhaled into the lungs include:  Food.  Any type of liquid, such as drinks or saliva.  Stomach contents, such as vomit or stomach acid.  What are the signs of aspiration? Signs of aspiration include:  Coughing after swallowing food or liquids.  Clearing the throat often while eating.  Trouble breathing. This may include: ? Breathing quickly. ? Breathing very slowly. ? Loud breathing. ? Rumbling sounds from the lungs while breathing.  Coughing up phlegm (sputum) that: ? Is yellow, tan, or green. ? Has pieces of food in it. ? Is bad-smelling.  Having a hoarse, barky cough.  Not being able to speak.  A hoarse voice.  Drooling while eating.  A feeling of fullness in the throat or a feeling that something is stuck in the throat.  Choking often.  Having a runny noise while eating.  Coughing when lying down or having to sit up quickly after lying down.  A change in skin color. The skin may look red or blue.  Fever.  Watery eyes.  Pain in the chest or back.  A pained look on the face.  What are the complications of aspiration? Complications of aspiration include:  Losing weight because the person is not absorbing needed nutrients.  Loss of enjoyment and the social benefits of eating.  Choking.  Lung irritation, if someone aspirates acidic food or drinks.  Lung infection (pneumonia).  Collection of infected liquid (pus) in the lungs (lung abscess).  In serious cases, death can occur. What can I do to prevent aspiration? Caring for someone who has a feeding tube If you are caring for someone who has a feeding tube who cannot eat or drink safely through his or her mouth:  Keep the person in an upright position as much as possible.  Do not lay the person flat if he or she is getting continuous feedings. If you need to lay the  person flat for any reason, turn the feeding pump off.  Check feeding tube residuals as told by your health care provider. Ask your health care provider what residual amount is too high.  Caring for someone who can eat and drink safely by mouth If you are caring for someone who can eat and drink safely through his or her mouth:  Have the person sit in an upright position when eating food or drinking fluids. This can be done in two ways: ? Have the person sit up in a chair. ? If sitting in a chair is not possible, position the person in bed so he or she is upright.  Remind the person to eat slowly and chew well. Make sure the person is awake and alert while eating.  Do not distract the person. This is especially important for people with thinking or memory (cognitive) problems.  Allow foods to cool. Hot foods may be more difficult to swallow.  Provide small meals more frequently, instead of 3 large meals. This may reduce fatigue during eating.  Check the person's mouth thoroughly for leftover food after eating.  Keep the person sitting upright for 30-45 minutes after eating.  Do not serve food or drink during 2 hours or more before bedtime.  General instructions Follow these general guidelines to prevent aspiration in someone who can eat and drink safely by mouth:  Never put food or liquids in the mouth of a person who is  not fully alert.  Feed small amounts of food. Do not force feed.  For a person who is on a diet for swallowing difficulty (dysphagia diet), follow the recommended food and drink consistency. For example, in dysphagia diet level 1, thicken liquids to pudding-like consistency.  Use as little water as possible when brushing the person's teeth or cleaning his or her mouth.  Provide oral care before and after meals.  Use adaptive devices such as cut-out cups, straws, or utensils as told by the health care provider.  Crush pills and put them in soft food such as  pudding or ice cream. Some pills should not be crushed. Check with the health care provider before crushing any medicine.  Contact a health care provider if:  The person has a feeding tube, and the feeding tube residual amount is too high.  The person has a fever.  The person tries to avoid food or water, such as refusing to eat, drink, or be fed, or is eating less than normal.  The person may have aspirated food or liquid.  You notice warning signs, such as choking or coughing, when the person eats or drinks. Get help right away if:  The person has trouble breathing or starts to breathe quickly.  The person is breathing very slowly or stops breathing.  The person coughs a lot after eating or drinking.  The person has a long-lasting (chronic) cough.  The person coughs up thick, yellow, or tan sputum.  If someone is choking on food or an object, perform the Heimlich maneuver (abdominal thrusts).  The person has symptoms of pneumonia, such as: ? Coughing a lot. ? Coughing up mucus with a bad smell or blood in it. ? Feeling short of breath. ? Complaining of chest pain. ? Sweating, fever, and chills. ? Feeling tired. ? Complaining of trouble breathing. ? Wheezing.  The person cannot stop choking.  The person is unable to breathe, turns blue, faints, or seems confused. These symptoms may represent a serious problem that is an emergency. Do not wait to see if the symptoms will go away. Get medical help right away. Call your local emergency services (911 in the [place]  Summary  Aspiration is the breathing in (inhalation) of a liquid or object into the lungs. Things that can be inhaled into the lungs include food, liquids, saliva, or stomach contents.  Aspiration can cause pneumonia or choking.  One sign of aspiration is coughing after swallowing food or liquids.  Contact a health care provider if you notice signs of aspiration. This information is not intended to replace  advice given to you by your health care provider. Make sure you discuss any questions you have with your health care provider. Document Released: 04/12/2010 Document Revised: 12/06/2015 Document Reviewed: 12/06/2015 Elsevier Interactive Patient Education  2018 [employer]. Aspiration Pneumonia Aspiration pneumonia is an infection in your lungs. It occurs when food, liquid, or stomach contents (vomit) are inhaled (aspirated) into your lungs. When these things get into your lungs, swelling (inflammation) and infection can occur. This can make it difficult for you to breathe. Aspiration pneumonia is a serious condition and can be life threatening. What increases the risk? Aspiration pneumonia is more likely to occur when a person's cough (gag) reflex or ability to swallow has been decreased. Some things that can do this include:  Having a brain injury or disease, such as stroke, seizures, Parkinson's disease, dementia, or amyotrophic lateral sclerosis (ALS).  Being given general anesthetic for procedures.  Being in a coma (unconscious).  Having a narrowing of the tube that carries food to the stomach (esophagus).  Drinking too much alcohol. If a person passes out and vomits, vomit can be swallowed into the lungs.  Taking certain medicines, such as tranquilizers or sedatives.  What are the signs or symptoms?  Coughing after swallowing food or liquids.  Breathing problems, such as wheezing or shortness of breath.  Bluish skin. This can be caused by lack of oxygen.  Coughing up food or mucus. The mucus might contain blood, greenish material, or yellowish-white fluid (pus).  Fever.  Chest pain.  Being more tired than usual (fatigue).  Sweating more than usual.  Bad breath. How is this diagnosed? A physical exam will be done. During the exam, the health care provider will listen to your lungs with a stethoscope to check for:  Crackling sounds in the lungs.  Decreased breath  sounds.  A rapid heartbeat.  Various tests may be ordered. These may include:  Chest X-ray.  CT scan.  Swallowing study. This test looks at how food is swallowed and whether it goes into your breathing tube (trachea) or food pipe (esophagus).  Sputum culture. Saliva and mucus (sputum) are collected from the lungs or the tubes that carry air to the lungs (bronchi). The sputum is then tested for bacteria.  Bronchoscopy. This test uses a flexible tube (bronchoscope) to see inside the lungs.  How is this treated? Treatment will usually include antibiotic medicines. Other medicines may also be used to reduce fever or pain. You may need to be treated in the hospital. In the hospital, your breathing will be carefully monitored. Depending on how well you are breathing, you may need to be given oxygen, or you may need breathing support from a breathing machine (ventilator). For people who fail a swallowing study, a feeding tube might be placed in the stomach, or they may be asked to avoid certain food textures or liquids when they eat. Follow these instructions at home:  Carefully follow any special eating instructions you were given, such as avoiding certain food textures or thickening liquids. This reduces the risk of developing aspiration pneumonia again.  Only take over-the-counter or prescription medicines as directed by your health care provider. Follow the directions carefully.  If you were prescribed antibiotics, take them as directed. Finish them even if you start to feel better.  Rest as instructed by your health care provider.  Keep all follow-up appointments with your health care provider. Contact a health care provider if:  You develop worsening shortness of breath, wheezing, or difficulty breathing.  You develop a fever.  You have chest pain. This information is not intended to replace advice given to you by your health care provider. Make sure you discuss any questions you  have with your health care provider. Document Released: 01/05/2009 Document Revised: 08/22/2015 Document Reviewed: 08/26/2012 Elsevier Interactive Patient Education  2017 [place] is a service that is designed to provide people who are terminally ill and their families with medical, spiritual, and psychological support. Its aim is to improve your quality of life by keeping you as alert and comfortable as possible. Who will be my providers when I begin hospice care? Hospice teams often include:  A nurse.  A doctor. The hospice doctor will be available for your care, but you can bring your regular doctor or nurse practitioner.  Social workers.  Religious leaders (such as a Clinical biochemist).  Trained volunteers.  What  roles will providers play in my care? Hospice is performed by a team of health care professionals and volunteers who:  Help keep you comfortable: ? Hospice can be provided in your home or in a homelike setting. ? The hospice staff works with your family and friends to help meet your needs. ? You will enjoy the support of loved ones by receiving much of your basic care from family and friends.  Provide pain relief and manage your symptoms. The staff supply all necessary medicines and equipment.  Provide companionship when you are alone.  Allow you and your family to rest. They may do light housekeeping, prepare meals, and run errands.  Provide counseling. They will make sure your emotional, spiritual, and social needs and those of your family are being met.  Provide spiritual care: ? Spiritual care will be individualized to meet your needs and your family's needs. ? Spiritual care may involve:  Helping you look at what death means to you.  Helping you say goodbye to your family and friends.  Performing a specific religious ceremony or ritual.  When should hospice care begin? Most people who use hospice are believed to have fewer than  6 months to live.  Your family and health care providers can help you decide when hospice services should begin.  If your condition improves, you may discontinue the program.  What should I consider before selecting a program? Most hospice programs are run by nonprofit, independent organizations. Some are affiliated with hospitals, nursing homes, or home health care agencies. Hospice programs can take place in the home or at a hospice center, hospital, or skilled nursing facility. When choosing a hospice program, ask the following questions:  What services are available to me?  What services will be offered to my loved ones?  How involved will my loved ones be?  How involved will my health care provider be?  Who makes up the hospice care team? How are they trained or screened?  How will my pain and symptoms be managed?  If my circumstances change, can the services be provided in a different setting, such as my home or in the hospital?  Is the program reviewed and licensed by the state or certified in some other way?  Where can I learn more about hospice? You can learn about existing hospice programs in your area from your health care providers. You can also read more about hospice online. The websites of the following organizations contain helpful information:  The [facility] and Palliative Care Organization [facility]).  The Hospice Association of America ([place]).  The [place].  The American Cancer Society (ACS).  Hospice Net.  This information is not intended to replace advice given to you by your health care provider. Make sure you discuss any questions you have with your health care provider. Document Released: 06/27/2003 Document Revised: 10/25/2015 Document Reviewed: 01/18/2013 Elsevier Interactive Patient Education  2017 [employer].

## 2016-08-21 NOTE — Progress Notes (Signed)
Patient will discharge to [facility] Anticipated discharge date: 08/21/16 Family notified: Malonie Tatum, brother Transportation by: [facility]   CSW signing off.  Estanislado Emms, [place]  Clinical Social Worker

## 2016-08-21 NOTE — NC FL2 (Signed)
[place] LEVEL OF CARE SCREENING TOOL     IDENTIFICATION  Patient Name: Tammy Beard Birthdate: October 05, 1940 Sex: female Admission Date (Current Location): 08/08/2016  [facility] and [place] Number:  Herbalist and Address:  [facility] [place], [place] [address] [place], [place] [address]      Provider Number: [id number]  Attending Physician Name and Address:  Cristal Ford, DO  Relative Name and Phone Number:  Sharlee Rufino, brother, [phone]     Current Level of Care: Hospital Recommended Level of Care: [place] Prior Approval Number:    Date Approved/Denied:   PASRR Number:    Discharge Plan: SNF    Current Diagnoses: Patient Active Problem List   Diagnosis Date Noted  . HCAP (healthcare-associated pneumonia)   . Aspiration into airway   . Palliative care encounter   . Goals of care, counseling/discussion   . Encounter for hospice care discussion   . Protein-calorie malnutrition, severe 08/14/2016  . Pressure injury of skin 08/10/2016  . Aspiration pneumonia ([place]) 08/09/2016  . Acute metabolic encephalopathy 69/48/5462  . Severe sepsis ([place]) 08/09/2016  . Paronychia of great toe, left 07/29/2016  . History of rib fracture 07/25/2016  . Vascular dementia 07/18/2016  . Weight loss 07/14/2016  . Pressure ulcer of buttock 06/17/2016  . Constipation 06/17/2016  . Chronic anticoagulation 06/10/2016  . Syncope 04/01/2016  . Psychosis 04/01/2016  . Dysphagia, pharyngoesophageal phase 04/01/2016  . Paroxysmal atrial fibrillation (HCC)   . Acute ischemic right MCA stroke ([place]) 03/24/2016  . Cerebrovascular accident (CVA) due to embolism of cerebral artery ([place]) 03/24/2016  . Left hemiparesis ([place]) 03/23/2016  . Hypokalemia 03/23/2016  . Anemia 03/20/2016  . Traumatic closed nondisplaced fracture of distal end of right radius 03/10/2016  . UTI (urinary tract infection) 02/22/2016  . Intracranial vascular  stenosis 02/07/2016  . CVA, old, disturbances of vision 12/20/2015  . Abnormality of gait 11/27/2015  . Citrobacter infection   . GERD (gastroesophageal reflux disease) 11/22/2015  . Malaise 11/22/2015  . Hyponatremia 11/22/2015  . Ecchymosis 11/15/2015  . Palpitations 11/02/2015  . Atrial tachycardia ([place]) 09/25/2015  . History of stroke   . Disequilibrium   . Dizzy 09/18/2015  . 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 Diastasis recti 01/23/2015  . DM (diabetes mellitus), type 2, uncontrolled w/neurologic complication ([place]) 70/35/0093  . Heart murmur 03/07/2014  . Polymyalgia rheumatica ([place]) 12/08/2013  . Disturbance in sleep behavior 06/28/2009  . Urinary frequency 06/28/2009  . Dyslipidemia 11/09/2008  . Essential hypertension 06/18/2007  . ALLERGIC RHINITIS 06/18/2007  . Ulcerative colitis ([place]) 06/08/2007  . OSTEOPENIA 06/08/2007  . MURMUR 06/08/2007    Orientation RESPIRATION BLADDER Height & Weight     Self  Normal Incontinent Weight: 101 lb 9.6 oz (46.1 kg) Height:  5\' 1"  (154.9 cm)  BEHAVIORAL SYMPTOMS/MOOD NEUROLOGICAL BOWEL NUTRITION STATUS  Other (Comment) (No Behaviors)   Incontinent Diet (NPO, please see DC summary)  AMBULATORY STATUS COMMUNICATION OF NEEDS Skin   Total Care Verbally PU Stage and Appropriate Care (foam dressing)                       Personal Care Assistance Level of Assistance  Bathing, Feeding, Dressing, Total care Bathing Assistance: Maximum assistance Feeding assistance: Maximum assistance Dressing Assistance: Maximum assistance Total Care Assistance: Maximum assistance   Functional Limitations Info  Sight, Hearing, Speech Sight Info: Adequate Hearing Info: Impaired Speech Info: Adequate    SPECIAL  CARE FACTORS FREQUENCY   (Hospice to Follow)                    Contractures Contractures Info: Not present    Additional Factors Info  Code Status, Allergies, Insulin Sliding Scale Code Status Info: DNR Allergies  Info: NKA   Insulin Sliding Scale Info: insulin every 4 hours       Current Medications (08/21/2016):  This is the current hospital active medication list Current Facility-Administered Medications  Medication Dose Route Frequency Provider Last Rate Last Dose  .  stroke: mapping our early stages of recovery book   Does not apply Once Greta Doom, MD      . acetaminophen (TYLENOL) tablet 650 mg  650 mg Oral Q8H York, Marianne L, PA-C   650 mg at 08/21/16 0849  . apixaban (ELIQUIS) tablet 5 mg  5 mg Oral BID Lily Kocher, MD   5 mg at 08/21/16 0849  . aspirin chewable tablet 81 mg  81 mg Oral Daily Lily Kocher, MD   81 mg at 08/21/16 0850  . chlorhexidine (PERIDEX) 0.12 % solution 15 mL  15 mL Mouth Rinse BID Opyd, Ilene Qua, MD   15 mL at 08/21/16 0850  . diltiazem (CARDIZEM) tablet 30 mg  30 mg Oral Once Cristal Ford, DO      . diltiazem (CARDIZEM) tablet 90 mg  90 mg Oral Q12H Mikhail, [place], DO   90 mg at 08/21/16 0848  . insulin aspart (novoLOG) injection 0-9 Units  0-9 Units Subcutaneous Q4H Velvet Bathe, MD   1 Units at 08/21/16 [phone]  . lidocaine (LIDODERM) 5 % 1 patch  1 patch Transdermal Q24H York, Marianne L, PA-C   1 patch at 08/20/16 1525  . LORazepam (ATIVAN) 2 MG/ML concentrated solution 0.26 mg  0.26 mg Oral Q8H PRN York, Marianne L, PA-C   0.26 mg at 08/19/16 2008  . MEDLINE mouth rinse  15 mL Mouth Rinse q12n4p Opyd, Ilene Qua, MD   15 mL at 08/20/16 1600  . metoprolol tartrate (LOPRESSOR) injection 5 mg  5 mg Intravenous Q4H PRN Velvet Bathe, MD   5 mg at 08/21/16 0446  . metoprolol tartrate (LOPRESSOR) tablet 50 mg  50 mg Oral BID Charlynne Cousins, MD   50 mg at 08/21/16 0849  . predniSONE (DELTASONE) tablet 5 mg  5 mg Oral Q breakfast Lily Kocher, MD   5 mg at 08/21/16 0849  . sodium citrate-citric acid (ORACIT) solution 5 mL  5 mL Oral TID Velvet Bathe, MD   5 mL at 08/21/16 0848     Discharge Medications: Please see discharge summary for a  list of discharge medications.  Relevant Imaging Results:  Relevant Lab Results:   Additional Information SSN: [id number]   Barbette Or, [place]

## 2016-08-22 ENCOUNTER — Telehealth: Payer: Self-pay

## 2016-08-22 ENCOUNTER — Non-Acute Institutional Stay (SKILLED_NURSING_FACILITY): Payer: Medicare Other | Admitting: Nurse Practitioner

## 2016-08-22 ENCOUNTER — Encounter: Payer: Self-pay | Admitting: Nurse Practitioner

## 2016-08-22 DIAGNOSIS — F0150 Vascular dementia without behavioral disturbance: Secondary | ICD-10-CM

## 2016-08-22 DIAGNOSIS — I480 Paroxysmal atrial fibrillation: Secondary | ICD-10-CM | POA: Diagnosis not present

## 2016-08-22 DIAGNOSIS — K219 Gastro-esophageal reflux disease without esophagitis: Secondary | ICD-10-CM | POA: Diagnosis not present

## 2016-08-22 DIAGNOSIS — K5900 Constipation, unspecified: Secondary | ICD-10-CM

## 2016-08-22 DIAGNOSIS — D649 Anemia, unspecified: Secondary | ICD-10-CM | POA: Diagnosis not present

## 2016-08-22 DIAGNOSIS — M353 Polymyalgia rheumatica: Secondary | ICD-10-CM | POA: Diagnosis not present

## 2016-08-22 DIAGNOSIS — G8194 Hemiplegia, unspecified affecting left nondominant side: Secondary | ICD-10-CM

## 2016-08-22 DIAGNOSIS — E1165 Type 2 diabetes mellitus with hyperglycemia: Secondary | ICD-10-CM

## 2016-08-22 DIAGNOSIS — E876 Hypokalemia: Secondary | ICD-10-CM

## 2016-08-22 DIAGNOSIS — R350 Frequency of micturition: Secondary | ICD-10-CM | POA: Diagnosis not present

## 2016-08-22 DIAGNOSIS — I10 Essential (primary) hypertension: Secondary | ICD-10-CM

## 2016-08-22 DIAGNOSIS — R1314 Dysphagia, pharyngoesophageal phase: Secondary | ICD-10-CM | POA: Diagnosis not present

## 2016-08-22 DIAGNOSIS — J690 Pneumonitis due to inhalation of food and vomit: Secondary | ICD-10-CM

## 2016-08-22 DIAGNOSIS — IMO0002 Reserved for concepts with insufficient information to code with codable children: Secondary | ICD-10-CM

## 2016-08-22 DIAGNOSIS — E1143 Type 2 diabetes mellitus with diabetic autonomic (poly)neuropathy: Secondary | ICD-10-CM

## 2016-08-22 NOTE — Progress Notes (Signed)
Location:  [facility] Room Number: 80 Place of Service:  SNF (31) Provider: Mast, Manxie  NP  [name], MD  Patient Care Team: [name], MD as PCP - General (Internal Medicine) [name], MD as Consulting Physician (Cardiology) [name], MD as Consulting Physician (Ophthalmology) [name], MD as Consulting Physician (Gastroenterology) [name], MD as Consulting Physician (Dermatology)  Extended Emergency Contact Information Primary Emergency Contact: [name]) Address: [address] [place], [place] [address] [name] of [place] Phone: [phone] Relation: Brother Secondary Emergency Contact: [name] [place] of [place] Phone: [phone] Relation: Sister  Code Status:  DNR Goals of care: Advanced Directive information Advanced Directives 08/22/2016  Does Patient Have a Medical Advance Directive? Yes  Type of Paramedic of [place];Living will;Out of facility DNR (pink MOST or yellow form)  Does patient want to make changes to medical advance directive? No - Patient declined  Copy of [place] in Chart? Yes  Pre-existing out of facility DNR order (yellow form or pink MOST form) Yellow form placed in chart (order not valid for inpatient use)     Chief Complaint  Patient presents with  . Medical Management of Chronic Issues    HPI:  Pt is a 76 y.o. female seen today for Afib RVR 120-130 bpm.  Dr Sheppard Coil was consulted, recommended to change Diltiazepm to 90mg  q8h po/60mg  tid 30mg  bid prn for HR>120, continue Metoprolol 50mg  bid, continue Eliquis,  monitor the patient closely.     Hospitalized 08/08/16 to 08/21/16 for sepsis 2nd to aspiration pneumonia, treated with Zosyn and transitioned to Unasyn. Echocardiogram done on 08/16/2016: 60-65%, wall motion was normal.      Hx of urinary frequency, taking Myrbetriq  25mg  qd, Ulcerative colitis, stable, off Mesalamine. Polymyalgia rheumatica is stable, taking Prednisone 5mg  daily. Taking MTP for UTI suppression therapy. Taking Senokot tid for constipation. Taking Ranitidine 150mg  qdfor GERD, her blood pressure is controlled, taking metoprolol and Diltiazem.  Past Medical History:  Diagnosis Date  . Colitis   . Ecchymosis 11/15/2015  . Fracture of right radius 02/2016  . GERD (gastroesophageal reflux disease)   . Heart murmur   . Hyperlipidemia   . Hypertension   . Osteopenia    s/p 7 yrs Fosamax  . Polio   . Polymyalgia ([place])   . Rheumatic fever   . Sleep disorder   . Stroke ([place])   . Ulcerative colitis    Past Surgical History:  Procedure Laterality Date  . BREAST LUMPECTOMY  1986   left; benign  . CATARACT EXTRACTION     Left eye   . COLONOSCOPY  2006  . Colonosopy  07/05/13   Dr. Deatra Ina  . DILATION AND CURETTAGE OF UTERUS  12/1999  . MELANOMA EXCISION  2007   left arm  . NECK SURGERY  1986   Benign growth on neck removed  . TONSILLECTOMY      No Known Allergies  No facility-administered encounter medications on file as of 08/22/2016.    Outpatient Encounter Prescriptions as of 08/22/2016  Medication Sig  . aspirin 81 MG chewable tablet Chew 81 mg by mouth daily.  [place] Kitchen LORazepam (ATIVAN) 2 MG/ML concentrated solution Take 0.1 mLs (0.2 mg total) by mouth every 8 (eight) hours as needed for anxiety or seizure (or panic or shortness of breath due to panic).  Mar[place] Kitchen zinc oxide 20 %  ointment Apply 1 application topically as needed for irritation.  . [DISCONTINUED] acetaminophen (TYLENOL) 325 MG tablet Take 650 mg by mouth every 6 (six) hours as needed for mild pain or moderate pain.   . [DISCONTINUED] apixaban (ELIQUIS) 5 MG TABS tablet Take 1 tablet (5 mg total) by mouth 2 (two) times daily.  . [DISCONTINUED] Cranberry-Vitamin C-Inulin (UTI-STAT) LIQD Take 30 mLs by mouth 2 (two) times daily.  . [DISCONTINUED] diltiazem (CARDIZEM) 30 MG tablet  Take 1 tablet (30 mg total) by mouth 2 (two) times daily as needed (for HR >120).  . [DISCONTINUED] diltiazem (CARDIZEM) 60 MG tablet Take 1 tablet (60 mg total) by mouth 3 (three) times daily.  . [DISCONTINUED] divalproex (DEPAKOTE SPRINKLE) 125 MG capsule Take 125 mg by mouth daily.  . [DISCONTINUED] ferrous sulfate 325 (65 FE) MG EC tablet Take 325 mg by mouth every evening.  . [DISCONTINUED] metoprolol tartrate (LOPRESSOR) 50 MG tablet Take 1 tablet (50 mg total) by mouth 2 (two) times daily.  . [DISCONTINUED] mirabegron ER (MYRBETRIQ) 25 MG TB24 tablet Take 25 mg by mouth daily.  . [DISCONTINUED] potassium chloride SA (K-DUR,KLOR-CON) 20 MEQ tablet Take 20 mEq by mouth daily.   . [DISCONTINUED] predniSONE (DELTASONE) 5 MG tablet ONE TABLETs DAILY TO TREAT POLYMYALGIA RHEUMATICA (Patient taking differently: Take 5 mg by mouth daily with breakfast. TO TREAT POLYMYALGIA RHEUMATICA)  . [DISCONTINUED] ranitidine (ZANTAC) 150 MG tablet Take 150 mg by mouth at bedtime. Take one tablet at bedtime   . [DISCONTINUED] senna (SENOKOT) 8.6 MG tablet Take 1 tablet by mouth daily.     Review of Systems  Constitutional: Negative for activity change, appetite change, chills, diaphoresis, fatigue and fever.  HENT: Positive for hearing loss. Negative for congestion, ear discharge, ear pain, postnasal drip, rhinorrhea, sore throat, tinnitus, trouble swallowing and voice change.   Eyes: Positive for visual disturbance (corrective lenses). Negative for pain, redness and itching.       Old CVA  Respiratory: Negative for cough, choking, shortness of breath and wheezing.   Cardiovascular: Negative for chest pain, palpitations and leg swelling.  Gastrointestinal: Positive for diarrhea, nausea and vomiting. Negative for abdominal distention, abdominal pain and constipation.       Hx ulcerative colitis; on Lialda. Hx GERD, currently asymptomatic. Poor appetite.  Endocrine: Negative for cold intolerance, heat  intolerance, polydipsia, polyphagia and polyuria.       History of elevated glucose.  Genitourinary: Positive for frequency. Negative for difficulty urinating, dysuria, flank pain, hematuria, pelvic pain, urgency and vaginal discharge.       Nocturia. Recurrent UTI.  Musculoskeletal: Positive for back pain and gait problem. Negative for arthralgias, myalgias, neck pain and neck stiffness.       Hx PMR and chronic use of prednisone. History of osteopenia and kyphosis. 03/10/16- fracture of the distal right radius. In forearm cast. Complains of weakness and discomfort in the low back and increased problems getting out of her recliner chair.  Skin: Negative for color change, pallor and rash.  Allergic/Immunologic: Negative.   Neurological: Positive for weakness (left hemiparesis affecting the left leg mainly). Negative for dizziness, tremors, seizures, syncope, light-headedness, numbness and headaches.       History of poliomyelitis at age 83. History of CVA.09/21/15, 03/23/16. Encephalopathy during last hospital stay .  Hematological: Negative.  Negative for adenopathy. Does not bruise/bleed easily.  Psychiatric/Behavioral: Positive for confusion, decreased concentration and dysphoric mood. Negative for agitation, behavioral problems, hallucinations, sleep disturbance and suicidal ideas. The patient is nervous/anxious.  The patient is not hyperactive.     Immunization History  Administered Date(s) Administered  . Hep A / Hep B 06/08/2013, 06/15/2013, 06/29/2013, 06/12/2014  . Influenza Split 01/01/2014  . Influenza Whole 01/17/2009, 12/02/2010  . Influenza-Unspecified 12/22/2013, 12/21/2014, 01/04/2016  . Pneumococcal Conjugate-13 01/27/2015  . Pneumococcal Polysaccharide-23 07/25/2009  . Td 07/25/2009  . Tdap 12/22/2011   Pertinent  Health Maintenance Due  Topic Date Due  . FOOT EXAM  11/18/1950  . URINE MICROALBUMIN  [birth year]18/1950  . OPHTHALMOLOGY EXAM  10/23/2015  . INFLUENZA VACCINE   10/22/2016  . HEMOGLOBIN A1C  02/17/2017  . MAMMOGRAM  07/14/2017  . DEXA SCAN  Completed  . PNA vac Low Risk Adult  Completed   Fall Risk  06/09/2016 06/09/2016 02/05/2016 11/02/2015 05/01/2015  Falls in the past year? No No Yes No No  Number falls in past yr: - - 1 - -  Injury with Fall? - - No - -   Functional Status Survey:    Vitals:   08/22/16 1111  BP: (!) 162/94  Pulse: 88  Resp: (!) 22  Temp: (!) 96.4 F (35.8 C)  Weight: 104 lb 8 oz (47.4 kg)   Body mass index is 19.75 kg/m. Physical Exam  Constitutional: She appears well-developed. No distress.  thin  HENT:  Right Ear: External ear normal.  Left Ear: External ear normal.  Nose: Nose normal.  Mouth/Throat: Oropharynx is clear and moist. No oropharyngeal exudate.  Wearing hearing aids bilaterally. Previous exam showed EAC and TM normal bilaterally.  Eyes: Conjunctivae and EOM are normal. Pupils are equal, round, and reactive to light. No scleral icterus.  Prescription lenses  Neck: No JVD present. No tracheal deviation present. No thyromegaly present.  Cardiovascular: Normal rate, regular rhythm and intact distal pulses.  Exam reveals no gallop and no friction rub.   Murmur heard. Pulmonary/Chest: Effort normal. No respiratory distress. She has no wheezes. She has no rales. She exhibits no tenderness.  Abdominal: Soft. Bowel sounds are normal. She exhibits no distension and no mass. There is no tenderness.  Large diastasis recti  Musculoskeletal: Normal range of motion. She exhibits edema and tenderness.  Kyphosis. Gait instability. HX Right wrist fx,   Lymphadenopathy:    She has no cervical adenopathy.  Neurological: She is alert. No cranial nerve deficit. She exhibits abnormal muscle tone. Coordination abnormal.  Mild tremor of extended arms. Left sided weakness with main effect on the left leg. Confused.  Skin: No rash noted. She is not diaphoretic. No erythema. No pallor.  R+L buttock superficial pressure  ulcers.   Psychiatric: She has a normal mood and affect. Her behavior is normal. Judgment and thought content normal.    Labs reviewed:  Recent Labs  08/12/16 0458  08/18/16 0626  08/20/16 0800 08/21/16 08/21/16 0334 08/25/16 0347  NA 134*  < >  --   < > 131* 133* 133* 130*  K 2.6*  < >  --   < > 2.9*  --  4.1 4.9  CL 94*  < >  --   < > 96*  --  97* 92*  CO2 29  < >  --   < > 24  --  24 26  GLUCOSE 108*  < >  --   < > 139*  --  111* 264*  BUN 9  < >  --   < > 11 12 12  32*  CREATININE 0.69  0.74  < >  --   < >  0.70 0.7 0.73 1.57*  CALCIUM 8.8*  < >  --   < > 8.5*  --  8.7* 9.3  MG 1.9  < > 1.8  --  1.7  --  2.5*  --   PHOS 2.8  --   --   --   --   --   --   --   < > = values in this interval not displayed.  Recent Labs  08/08/16 2224 08/13/16 2126 08/25/16 0347  AST 26 28 22   ALT 14 49 15  ALKPHOS 159* 175* 132*  BILITOT 0.4 0.6 0.6  PROT 7.6 6.6 6.5  ALBUMIN 3.8 2.9* 3.0*    Recent Labs  08/08/16 2224  08/13/16 2126 08/17/16 08/17/16 2344 08/25/16 0347  WBC 11.0*  < > 9.1 6.5 6.5 22.1*  NEUTROABS 8.9*  --  6.7  --   --  20.6*  HGB 11.0*  < > 9.1*  --  8.5* 10.1*  HCT 34.3*  < > 29.4*  --  27.6* 33.7*  MCV 90.0  < > 91.3  --  89.6 89.2  PLT 281  < > 305  --  301 516*  < > = values in this interval not displayed. Lab Results  Component Value Date   TSH 1.573 03/24/2016   Lab Results  Component Value Date   HGBA1C 5.8 (H) 08/17/2016   Lab Results  Component Value Date   CHOL 164 08/17/2016   HDL 59 08/17/2016   LDLCALC 78 08/17/2016   TRIG 135 08/17/2016   CHOLHDL 2.8 08/17/2016    Significant Diagnostic Results in last 30 days:  Ct Angio Head W/cm &/or Wo Cm  Result Date: 08/16/2016 CLINICAL DATA:  History of CVA with LEFT-sided weakness. Atrial fibrillation. Diabetes. EXAM: CT ANGIOGRAPHY HEAD AND NECK TECHNIQUE: Multidetector CT imaging of the head and neck was performed using the standard protocol during bolus administration of intravenous  contrast. Multiplanar CT image reconstructions and MIPs were obtained to evaluate the vascular anatomy. Carotid stenosis measurements (when applicable) are obtained utilizing NASCET criteria, using the distal internal carotid diameter as the denominator. CONTRAST:  50 mL Isovue 370. COMPARISON:  MR brain 08/16/2016. CT head 08/13/2016. CTA head neck 09/24/2015. FINDINGS: CT HEAD Calvarium and skull base: No fracture or destructive lesion. Mastoids and middle ears are grossly clear. Paranasal sinuses: Imaged portions are clear. Orbits: Negative. Brain: No evidence of acute abnormality, including acute infarct, hemorrhage, hydrocephalus, or mass lesion. Atrophy. Chronic microvascular ischemic change. Subacute to chronic RIGHT parietal infarct, suspected watershed distribution. CTA NECK Aortic arch: Standard branching. Imaged portion shows no evidence of aneurysm or dissection. No significant stenosis of the major arch vessel origins. Right carotid system: Minor atheromatous change the bifurcation. No evidence of dissection, stenosis (50% or greater) or occlusion. Left carotid system: Minor atheromatous change at the bifurcation. No evidence of dissection, stenosis (50% or greater) or occlusion. Vertebral arteries: Codominant. No evidence of dissection, stenosis (50% or greater) or occlusion. Nonvascular soft tissues: Biapical lung opacities, greater on the LEFT, with moderate-sized effusions. Pneumonia not excluded. No neck masses. No adenopathy. Spondylosis. CTA HEAD Anterior circulation: Severe calcified LEFT ICA terminus stenosis. Moderate LEFT ICA cavernous stenosis. High-grade RIGHT ICA genu stenosis. LEFT M1 MCA is moderately irregular but patent. LEFT M1 stenosis in its midportion estimated 50%. Patent LEFT MCA branches. Severe distal RIGHT M1 MCA stenosis, estimated 75% or greater. No M2 MCA branch occlusion. No aneurysm, or vascular malformation. Posterior circulation: Unremarkable. No significant stenosis,  proximal occlusion, aneurysm, or vascular malformation. Venous sinuses: As permitted by contrast timing, patent. Anatomic variants: None of significance. Delayed phase:   No abnormal intracranial enhancement. IMPRESSION: No extracranial stenosis of significance. 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 Subacute to chronic RIGHT parietal cortical and subcortical infarct, possible watershed insult. Electronically Signed   By: Staci Righter M.D.   On: 08/16/2016 15:48   Dg Chest 2 View  Result Date: 08/08/2016 CLINICAL DATA:  Initial evaluation for atrial fibrillation. History of hypertension, murmur. EXAM: CHEST  2 VIEW COMPARISON:  Prior radiograph from 07/25/2016. FINDINGS: Stable cardiomegaly. Mediastinal silhouette within normal limits. Aortic atherosclerosis. Chronic lung changes with emphysema and scarring. Small bilateral pleural effusions, greater on the left. Patchy bibasilar opacities, which may reflect atelectasis or infiltrates. No overt pulmonary edema. No pneumothorax. Irregular pleural thickening at the right lung apex is stable. No acute osseus abnormality. Exaggeration of the normal thoracic kyphosis, similar to previous. Subacute to chronic right-sided rib fractures. Osteopenia. IMPRESSION: 1. Small bilateral pleural effusions, left slightly larger than right. Mild patchy bibasilar opacities may reflect atelectasis or possibly infiltrates. 2. Stable cardiomegaly without pulmonary edema. 3. Aortic atherosclerosis. Electronically Signed   By: Jeannine Boga M.D.   On: 08/08/2016 23:05   Dg Abd 1 View  Result Date: 08/13/2016 CLINICAL DATA:  Followup ileus. EXAM: ABDOMEN - 1 VIEW COMPARISON:  Earlier today FINDINGS: Unchanged diffuse gas dilatation of small and large bowel. Bladder shadow noted; no concerning mass effect. No convincing extraluminal gas. IMPRESSION: History of ileus with unchanged gaseous distension of  small and large bowel. No change compared to earlier today. Electronically Signed   By: Monte Fantasia M.D.   On: 08/13/2016 14:35   Ct Head Wo Contrast  Result Date: 08/13/2016 CLINICAL DATA:  76 y/o F; aphasia and left-sided symptoms with history of stroke. Interval mental status change. EXAM: CT HEAD WITHOUT CONTRAST TECHNIQUE: Contiguous axial images were obtained from the base of the skull through the vertex without intravenous contrast. COMPARISON:  08/09/2016 CT of the head. FINDINGS: Brain: No large acute territory infarct or intracranial hemorrhage identified. Chronic right frontal parietal region infarction is unchanged from prior CT of head. Background of mild to moderate chronic microvascular ischemic changes in white matter in parenchymal volume loss. 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 Vascular: Mild calcific atherosclerosis of carotid siphons. No hyperdense vessel identified. Skull: Normal. Negative for fracture or focal lesion. Sinuses/Orbits: Partial bilateral mastoid opacification. Visualized paranasal sinuses are clear. Underpneumatized frontal sinuses. Bilateral intra-ocular lens replacement. Other: None. IMPRESSION: 1. No acute intracranial abnormality identified. 2. Stable appearance of right frontal parietal region chronic infarction, moderate chronic microvascular ischemic changes of white matter, and parenchymal volume loss of the brain. These results were called by telephone at the time of interpretation on 08/13/2016 at 8:41 pm to Dr. Clance Boll , who verbally acknowledged these results. Electronically Signed   By: Kristine Garbe M.D.   On: 08/13/2016 20:44   Ct Head Wo Contrast  Result Date: 08/09/2016 CLINICAL DATA:  Altered mental status for 1 day. EXAM: CT HEAD WITHOUT CONTRAST TECHNIQUE: Contiguous axial images were obtained from the base of the skull through the vertex without intravenous contrast. COMPARISON:   Head CT 03/27/2016. FINDINGS: Brain: Stable degree of atrophy and chronic small vessel ischemia. Stable appearance of remote infarct in post right parietal lobe. Mega cisterna magna incidentally noted. No hemorrhage or evidence of acute ischemia.  No mass effect or midline shift. Vascular: No hyperdense vessel or unexpected calcification. Skull: Normal. Negative for fracture or focal lesion. Sinuses/Orbits: Unchanged opacification of lower bilateral mastoid air cells. Paranasal sinuses are well-aerated. Post bilateral cataract resection. Other: None. IMPRESSION: Stable degree of atrophy, chronic and remote ischemia. No CT findings of acute intracranial abnormality. Electronically Signed   By: Jeb Levering M.D.   On: 08/09/2016 00:33   Ct Angio Neck W/cm &/or Wo/cm  Result Date: 08/16/2016 CLINICAL DATA:  History of CVA with LEFT-sided weakness. Atrial fibrillation. Diabetes. EXAM: CT ANGIOGRAPHY HEAD AND NECK TECHNIQUE: Multidetector CT imaging of the head and neck was performed using the standard protocol during bolus administration of intravenous contrast. Multiplanar CT image reconstructions and MIPs were obtained to evaluate the vascular anatomy. Carotid stenosis measurements (when applicable) are obtained utilizing NASCET criteria, using the distal internal carotid diameter as the denominator. CONTRAST:  50 mL Isovue 370. COMPARISON:  MR brain 08/16/2016. CT head 08/13/2016. CTA head neck 09/24/2015. FINDINGS: CT HEAD Calvarium and skull base: No fracture or destructive lesion. Mastoids and middle ears are grossly clear. Paranasal sinuses: Imaged portions are clear. Orbits: Negative. Brain: No evidence of acute abnormality, including acute infarct, hemorrhage, hydrocephalus, or mass lesion. Atrophy. Chronic microvascular ischemic change. Subacute to chronic RIGHT parietal infarct, suspected watershed distribution. CTA NECK Aortic arch: Standard branching. Imaged portion shows no evidence of aneurysm or  dissection. No significant stenosis of the major arch vessel origins. Right carotid system: Minor atheromatous change the bifurcation. No evidence of dissection, stenosis (50% or greater) or occlusion. Left carotid system: Minor atheromatous change at the bifurcation. No evidence of dissection, stenosis (50% or greater) or occlusion. Vertebral arteries: Codominant. No evidence of dissection, stenosis (50% or greater) or occlusion. Nonvascular soft tissues: Biapical lung opacities, greater on the LEFT, with moderate-sized effusions. Pneumonia not excluded. No neck masses. No adenopathy. Spondylosis. CTA HEAD Anterior circulation: Severe calcified LEFT ICA terminus stenosis. Moderate LEFT ICA cavernous stenosis. High-grade RIGHT ICA genu stenosis. LEFT M1 MCA is moderately irregular but patent. LEFT M1 stenosis in its midportion estimated 50%. Patent LEFT MCA branches. Severe distal RIGHT M1 MCA stenosis, estimated 75% or greater. No M2 MCA branch occlusion. No aneurysm, or vascular malformation. Posterior circulation: Unremarkable. No significant stenosis, proximal occlusion, aneurysm, or vascular malformation. Venous sinuses: As permitted by contrast timing, patent. Anatomic variants: None of significance. Delayed phase:   No abnormal intracranial enhancement. IMPRESSION: No extracranial stenosis of significance. 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 Subacute to chronic RIGHT parietal cortical and subcortical infarct, possible watershed insult. Electronically Signed   By: Staci Righter M.D.   On: 08/16/2016 15:48   Mr Brain Wo Contrast  Result Date: 08/16/2016 CLINICAL DATA:  Altered mental status, possible stroke, LEFT-sided weakness. History of stroke, hypertension, polio, hyperlipidemia. EXAM: MRI HEAD WITHOUT CONTRAST TECHNIQUE: Multiplanar, multiecho pulse sequences of the brain and surrounding structures were obtained without  intravenous contrast. COMPARISON:  CT HEAD Aug 13, 2016 and MRI of the head March 23, 2016 FINDINGS: Mild to moderately motion degraded examination. BRAIN: RIGHT occipital and LEFT temporal lobe periventricular foci of reduced diffusion measuring to 4 mm, not identifiable on ADC map. Faint reduced diffusion with normalized ADC values corresponding to RIGHT parietal lobe infarct. Mild ex vacuo dilatation RIGHT lateral ventricle, no hydrocephalus. Moderate to severe ventriculomegaly on the basis of global parenchymal brain volume loss, progressed from prior imaging. Mega cisterna magna. Severe vermian  atrophy. Patchy to confluent supratentorial and pontine white matter FLAIR T2 hyperintensities. No midline shift, mass effect or masses. No abnormal extra-axial fluid collections. VASCULAR: Normal major intracranial vascular flow voids present at skull base. SKULL AND UPPER CERVICAL SPINE: Partially empty sella. No suspicious calvarial bone marrow signal. Craniocervical junction maintained. SINUSES/ORBITS: Bilateral mastoid effusions. Paranasal sinuses are well-aerated. The included ocular globes and orbital contents are non-suspicious. Status post bilateral ocular lens implants. OTHER: None. IMPRESSION: Mildly motion degraded examination. Tiny RIGHT occipital and LEFT temporal lobe acute versus subacute infarcts. Old RIGHT parietal/ MCA territory infarct. Severe vermian atrophy in a background of moderate to severe parenchymal brain volume loss. Moderate chronic small vessel ischemic disease. Electronically Signed   By: Elon Alas M.D.   On: 08/16/2016 06:54   Dg Chest Port 1 View  Result Date: 08/25/2016 CLINICAL DATA:  Shortness of breath.  Its abscess. EXAM: PORTABLE CHEST 1 VIEW COMPARISON:  Frontal and lateral views 08/08/2016 FINDINGS: Unchanged heart size and mediastinal contours with atherosclerosis of the thoracic aorta. Ill-defined patchy opacities in the left perihilar lung. Possible patchy right  perihilar opacity. Small to moderate left pleural effusion suspected, possible small right pleural effusion. No pneumothorax. IMPRESSION: 1. Ill-defined patchy opacities throughout the left lung concerning for pneumonia. Suspect small to moderate left pleural effusion. Followup PA and lateral chest X-ray is recommended in 3-4 weeks following trial of antibiotic therapy to ensure resolution and exclude underlying malignancy. 2. Question additional right perihilar patchy opacity. 3. Thoracic aortic atherosclerosis. Electronically Signed   By: Jeb Levering M.D.   On: 08/25/2016 04:27   Dg Abd Portable 1v  Result Date: 08/13/2016 CLINICAL DATA:  Ileus. EXAM: PORTABLE ABDOMEN - 1 VIEW COMPARISON:  08/12/2016. FINDINGS: Persistent distended loops of small and large bowel noted. Findings consist with mild adynamic ileus. No free air. No acute bony abnormality. Degenerative changes lumbar spine and both hips. Diffuse osteopenia. Small bilateral pleural effusions . IMPRESSION: 1. Persistent distended loops of small and large bowel noted consistent with adynamic ileus. 2. Small bilateral pleural effusions. Electronically Signed   By: Marcello Moores  Register   On: 08/13/2016 07:18   Dg Abd Portable 1v  Result Date: 08/12/2016 CLINICAL DATA:  Abdominal distention and constipation for the past few days. EXAM: PORTABLE ABDOMEN - 1 VIEW COMPARISON:  Two views of the abdomen 06/29/2003. FINDINGS: Moderate and diffuse gaseous distention of small and large bowel is identified. Stool burden is unremarkable. No abnormal abdominal calcification or focal bony abnormality. IMPRESSION: Bowel gas pattern most compatible with ileus. Electronically Signed   By: Inge Rise M.D.   On: 08/12/2016 12:08    Assessment/Plan Left hemiparesis ([place]) -With left-sided hemiparesis -CT head without contrast shows no acute intracranial abnormalities, on 5/19 as well as 08/13/2016 -Neurology consulted and appreciated. During  hospitalization, it seems that patient did have a code stroke due to her decline in mentation. -MRI on 08/16/2016 showed tiny right occipital and left temporal lobe acute versus subacute infarcts. Old right parietal/MCA territory infarct. -Neurology felt these infarcts likely to be embolic secondary to atrial fibrillation. Recommending continuing Eliquis -LDL 78 -hemoglobin A1c 5.8 -Continue physical therapy, aspirin, Eliquis  Essential hypertension Controlled. Continue Metoprolol 50mg  bid, Diltiazem 90mg  tid  Paroxysmal atrial fibrillation (HCC) Change Diltiazepm 90mg  tid, continue Metoprolol 50mg  bid, monitor the patient.   Aspiration pneumonia ([place]) Fully treated, observe. Desires Hospice service, diet as tolerated.   GERD (gastroesophageal reflux disease) Stable, continue Ranitidine 150mg  qd  Dysphagia, pharyngoesophageal phase Desires hospice service,  diet as tolerated.   Constipation Stable, continue Senna to I tid  DM (diabetes mellitus), type 2, uncontrolled w/neurologic complication ([place]) Diet controlled, Hgb a1c 6.0 07/18/16  Vascular dementia 07/25/16 MMSE  16/28 SNF for care assistance, may consider memory preserving meds.  08/08/16 Psych adding Depakote 125mg  qam  Urinary frequency Persists, continue [name] 25mg  daily. Observe the patient.   Polymyalgia rheumatica ([place]) Managed with Prednisone 5mg  daily  Anemia 08/17/16 Hgb 8.5, Fe 325mg  qd since 08/05/16  Hypokalemia Serum K 4.1 08/21/16     Family/ staff Communication: SNF Hospice Service.   Labs/tests ordered:  none

## 2016-08-22 NOTE — Telephone Encounter (Signed)
This is a patient of A[place]wall, who was admitted to [facility] after hospitalization. [facility] F/U is needed. Hospital discharge from [facility] on 08/21/2016.

## 2016-08-23 ENCOUNTER — Non-Acute Institutional Stay (SKILLED_NURSING_FACILITY): Payer: Medicare Other | Admitting: Internal Medicine

## 2016-08-23 DIAGNOSIS — G9341 Metabolic encephalopathy: Secondary | ICD-10-CM

## 2016-08-23 DIAGNOSIS — J690 Pneumonitis due to inhalation of food and vomit: Secondary | ICD-10-CM | POA: Diagnosis not present

## 2016-08-23 DIAGNOSIS — IMO0002 Reserved for concepts with insufficient information to code with codable children: Secondary | ICD-10-CM

## 2016-08-23 DIAGNOSIS — E1143 Type 2 diabetes mellitus with diabetic autonomic (poly)neuropathy: Secondary | ICD-10-CM

## 2016-08-23 DIAGNOSIS — I472 Ventricular tachycardia: Secondary | ICD-10-CM

## 2016-08-23 DIAGNOSIS — I4729 Other ventricular tachycardia: Secondary | ICD-10-CM

## 2016-08-23 DIAGNOSIS — G8194 Hemiplegia, unspecified affecting left nondominant side: Secondary | ICD-10-CM | POA: Diagnosis not present

## 2016-08-23 DIAGNOSIS — I4891 Unspecified atrial fibrillation: Secondary | ICD-10-CM | POA: Diagnosis not present

## 2016-08-23 DIAGNOSIS — A419 Sepsis, unspecified organism: Secondary | ICD-10-CM | POA: Diagnosis not present

## 2016-08-23 DIAGNOSIS — I10 Essential (primary) hypertension: Secondary | ICD-10-CM

## 2016-08-23 DIAGNOSIS — E1165 Type 2 diabetes mellitus with hyperglycemia: Secondary | ICD-10-CM

## 2016-08-23 DIAGNOSIS — K567 Ileus, unspecified: Secondary | ICD-10-CM | POA: Diagnosis not present

## 2016-08-23 DIAGNOSIS — F0150 Vascular dementia without behavioral disturbance: Secondary | ICD-10-CM

## 2016-08-23 DIAGNOSIS — M353 Polymyalgia rheumatica: Secondary | ICD-10-CM | POA: Diagnosis not present

## 2016-08-23 DIAGNOSIS — I6340 Cerebral infarction due to embolism of unspecified cerebral artery: Secondary | ICD-10-CM

## 2016-08-24 NOTE — Progress Notes (Addendum)
: Provider:  Hennie Duos MD Location:  [place] Room Number: 39 Place of Service:  SNF (31)  PCP: No primary care provider on file. Patient Care Team: [name], MD as Consulting Physician (Cardiology) [name], MD as Consulting Physician (Ophthalmology) [name], MD as Consulting Physician (Gastroenterology) [name], MD as Consulting Physician (Dermatology)  Extended Emergency Contact Information Primary Emergency Contact: [name]) Address: [address] [place], [place] [address] [name] of [place] Phone: [phone] Relation: Brother Secondary Emergency Contact: [name] [place] of [place] Phone: [phone] Relation: Sister     Allergies: Patient has no known allergies.  Chief Complaint  Patient presents with  . Readmit To SNF    Readmit to Facility    HPI: Patient is 76 y.o. 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 The patient is hard of hearing at baseline with cognitive impairment. Her brother reports that she was last known well two days ago; he visited her and she seemed behaving like her normal self. By the next day, she was demonstrating increased confusion and told him "I don't feel well". She has a new wet cough and tachycardia. SNF personnel was concerned that she was in atrial fibrillation, so she was sent to the ED for further evaluation. Pt was admitted to [facility] from 5/18-31 for severe sepsis 2/2 aspiration PNA. Hospital course was complicated by PAF with RVR and non sustained V Tach along with acute encephalopathy and an ileus, which resolved. She  was also felt to have had another stroke 2/2 AF and eliquis was continued.Pt was d/c to SNF with a DNR and for Hospice to follow.While at SNF pt will be followed for HTN, tx with metoprolol and cardizem, PMR, tx with prednisone and DM, tx with diet.     Past Medical History:  Diagnosis Date  . Colitis   . Ecchymosis 11/15/2015  . Fracture of right radius 02/2016  . GERD (gastroesophageal reflux disease)   . Heart murmur   . Hyperlipidemia   . Hypertension   . Osteopenia    s/p 7 yrs Fosamax  . Polio   . Polymyalgia ([place])   . Rheumatic fever   . Sleep disorder   . Stroke ([place])   . Ulcerative colitis     Past Surgical History:  Procedure Laterality Date  . BREAST LUMPECTOMY  1986   left; benign  . CATARACT EXTRACTION     Left eye   . COLONOSCOPY  2006  . Colonosopy  07/05/13   Dr. Deatra Ina  . DILATION AND CURETTAGE OF UTERUS  12/1999  . MELANOMA EXCISION  2007   left arm  . NECK SURGERY  1986   Benign growth on neck removed  . TONSILLECTOMY      Allergies as of 08/23/2016   No Known Allergies     Medication List       Accurate as of 08/23/16 11:59 PM. Always use your most recent med list.          aspirin 81 MG chewable tablet Chew 81 mg by mouth daily.   aspirin 81 MG chewable tablet Chew 81 mg by mouth daily.   diltiazem 60  MG tablet Commonly known as:  CARDIZEM Take 60 mg by mouth 4 (four) times daily.   diltiazem 30 MG tablet Commonly known as:  CARDIZEM Take 30 mg by mouth 2 (two) times daily as needed.   diltiazem 90 MG tablet Commonly known as:  CARDIZEM   divalproex 125 MG capsule Commonly known as:  DEPAKOTE SPRINKLE Take 125 mg by mouth daily.   ELIQUIS 5 MG Tabs tablet Generic drug:  apixaban Take 5 mg by mouth 2 (two) times daily after a meal.   ferrous sulfate 325 (65 FE) MG tablet Take 325 mg by mouth daily with breakfast.   LORazepam 2 MG/ML concentrated solution Commonly known as:  ATIVAN Take 0.1 mLs (0.2 mg total) by mouth  every 8 (eight) hours as needed for anxiety or seizure (or panic or shortness of breath due to panic).   LORazepam 2 MG/ML concentrated solution Commonly known as:  ATIVAN   metoprolol tartrate 50 MG tablet Commonly known as:  LOPRESSOR   MYRBETRIQ 25 MG Tb24 tablet Generic drug:  mirabegron ER   potassium chloride SA 20 MEQ tablet Commonly known as:  K-DUR,KLOR-CON   predniSONE 5 MG tablet Commonly known as:  DELTASONE   ranitidine 150 MG tablet Commonly known as:  ZANTAC   senna 8.6 MG tablet Commonly known as:  SENOKOT Take 1 tablet by mouth daily.   UTI-STAT PO Take 30 mLs by mouth 2 (two) times daily.   zinc oxide 20 % ointment Apply 1 application topically as needed for irritation.       Meds ordered this encounter  Medications  . ELIQUIS 5 MG TABS tablet    Sig: Take 5 mg by mouth 2 (two) times daily after a meal.   . diltiazem (CARDIZEM) 90 MG tablet  . LORazepam (ATIVAN) 2 MG/ML concentrated solution  . metoprolol tartrate (LOPRESSOR) 50 MG tablet  . MYRBETRIQ 25 MG TB24 tablet  . potassium chloride SA (K-DUR,KLOR-CON) 20 MEQ tablet  . predniSONE (DELTASONE) 5 MG tablet  . ranitidine (ZANTAC) 150 MG tablet  . divalproex (DEPAKOTE SPRINKLE) 125 MG capsule    Sig: Take 125 mg by mouth daily.  . ferrous sulfate 325 (65 FE) MG tablet    Sig: Take 325 mg by mouth daily with breakfast.  . aspirin 81 MG chewable tablet    Sig: Chew 81 mg by mouth daily.  Marland Kitchen senna (SENOKOT) 8.6 MG tablet    Sig: Take 1 tablet by mouth daily.  . Cranberry-Vitamin C-Inulin (UTI-STAT PO)    Sig: Take 30 mLs by mouth 2 (two) times daily.  Marland Kitchen diltiazem (CARDIZEM) 60 MG tablet    Sig: Take 60 mg by mouth 4 (four) times daily.  Marland Kitchen diltiazem (CARDIZEM) 30 MG tablet    Sig: Take 30 mg by mouth 2 (two) times daily as needed.    Immunization History  Administered Date(s) Administered  . Hep A / Hep B 06/08/2013, 06/15/2013, 06/29/2013, 06/12/2014  . Influenza Split 01/01/2014  .  Influenza Whole 01/17/2009, 12/02/2010  . Influenza-Unspecified 12/22/2013, 12/21/2014, 01/04/2016  . Pneumococcal Conjugate-13 01/27/2015  . Pneumococcal Polysaccharide-23 07/25/2009  . Td 07/25/2009  . Tdap 12/22/2011    Social History  Substance Use Topics  . Smoking status: Never Smoker  . Smokeless tobacco: Never Used  . Alcohol use Yes     Comment: occasional once a year    Family history is   Family History  Problem Relation Age of Onset  . Pancreatic cancer Other   .  Other Mother        Coad/ Bronchiectosis  . Diabetes Paternal Grandmother   . Heart failure Paternal Grandmother   . Cancer Paternal Aunt        unaware of primary site  . Coronary artery disease Neg Hx   . Colon cancer Neg Hx   . Rectal cancer Neg Hx   . Stomach cancer Neg Hx   . Heart attack Neg Hx       Review of Systems  UTO 2/2 conditon and dementia; per nursing - no new concerns     Vitals:   08/24/16 2314  BP: (!) 136/117  Pulse: (!) 137  Resp: (!) 22  Temp: (!) 96.9 F (36.1 C)    SpO2 Readings from Last 1 Encounters:  08/25/16 100%   Body mass index is 19.75 kg/m.     Physical Exam  GENERAL APPEARANCE: Alert, min conversant,  No acute distress.  SKIN: No diaphoresis rash HEAD: Normocephalic, atraumatic  EYES: Conjunctiva/lids clear. Pupils round, reactive. EOMs intact.  EARS: External exam WNL, canals clear. Hearing grossly normal.  NOSE: No deformity or discharge.  MOUTH/THROAT: Lips w/o lesions  RESPIRATORY: Breathing is even, unlabored. Lung sounds are clear   CARDIOVASCULAR: Heart irreg, rapidno murmurs, rubs or gallops. No peripheral edema.   GASTROINTESTINAL: Abdomen is soft, non-tender, not distended w/ normal bowel sounds. GENITOURINARY: Bladder non tender, not distended  MUSCULOSKELETAL: No abnormal joints or musculature NEUROLOGIC:  Cranial nerves 2-12 grossly intact. Moves all extremities  PSYCHIATRIC: Mood and affect with dementia, no behavioral  issues  Patient Active Problem List   Diagnosis Date Noted  . Multiple bruises 08/29/2016  . Comfort measures only status 08/25/2016  . Atrial fibrillation with RVR ([place]) 08/25/2016  . Palliative care by specialist   . Advance care planning   . HCAP (healthcare-associated pneumonia)   . Aspiration into airway   . Palliative care encounter   . Goals of care, counseling/discussion   . Encounter for hospice care discussion   . Protein-calorie malnutrition, severe 08/14/2016  . Pressure injury of skin 08/10/2016  . Aspiration pneumonia ([place]) 08/09/2016  . Acute metabolic encephalopathy 41/93/7902  . Severe sepsis ([place]) 08/09/2016  . Paronychia of great toe, left 07/29/2016  . History of rib fracture 07/25/2016  . Vascular dementia 07/18/2016  . Weight loss 07/14/2016  . Pressure ulcer of buttock 06/17/2016  . Constipation 06/17/2016  . Chronic anticoagulation 06/10/2016  . Syncope 04/01/2016  . Psychosis 04/01/2016  . Dysphagia, pharyngoesophageal phase 04/01/2016  . Paroxysmal atrial fibrillation (HCC)   . Acute ischemic right MCA stroke ([place]) 03/24/2016  . Cerebrovascular accident (CVA) due to embolism of cerebral artery ([place]) 03/24/2016  . Left hemiparesis ([place]) 03/23/2016  . Hypokalemia 03/23/2016  . Anemia 03/20/2016  . Traumatic closed nondisplaced fracture of distal end of right radius 03/10/2016  . UTI (urinary tract infection) 02/22/2016  . Intracranial vascular stenosis 02/07/2016  . CVA, old, disturbances of vision 12/20/2015  . Abnormality of gait 11/27/2015  . Citrobacter infection   . Sepsis ([place]) 11/26/2015  . GERD (gastroesophageal reflux disease) 11/22/2015  . Malaise 11/22/2015  . Hyponatremia 11/22/2015  . Ecchymosis 11/15/2015  . Palpitations 11/02/2015  . Atrial tachycardia ([place]) 09/25/2015  . History of stroke   . Disequilibrium   . Dizzy 09/18/2015  . 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 Diastasis recti 01/23/2015  . DM (diabetes mellitus),  type 2, uncontrolled w/neurologic complication ([place]) 40/97/3532  . Heart murmur 03/07/2014  . Polymyalgia rheumatica ([place])  12/08/2013  . Disturbance in sleep behavior 06/28/2009  . Urinary frequency 06/28/2009  . Dyslipidemia 11/09/2008  . Essential hypertension 06/18/2007  . ALLERGIC RHINITIS 06/18/2007  . Ulcerative colitis ([employer]) 06/08/2007  . OSTEOPENIA 06/08/2007  . MURMUR 06/08/2007      Labs reviewed: Basic Metabolic Panel:    Component Value Date/Time   NA 130 (L) 08/25/2016 0347   NA 133 (A) 08/21/2016   K 4.9 08/25/2016 0347   CL 92 (L) 08/25/2016 0347   CO2 26 08/25/2016 0347   GLUCOSE 264 (H) 08/25/2016 0347   BUN 32 (H) 08/25/2016 0347   BUN 12 08/21/2016   CREATININE 1.57 (H) 08/25/2016 0347   CALCIUM 9.3 08/25/2016 0347   PROT 6.5 08/25/2016 0347   ALBUMIN 3.0 (L) 08/25/2016 0347   AST 22 08/25/2016 0347   ALT 15 08/25/2016 0347   ALKPHOS 132 (H) 08/25/2016 0347   BILITOT 0.6 08/25/2016 0347   GFRNONAA 31 (L) 08/25/2016 0347   GFRAA 36 (L) 08/25/2016 0347     Recent Labs  08/12/16 0458  08/18/16 0626  08/20/16 0800 08/21/16 08/21/16 0334 08/25/16 0347  NA 134*  < >  --   < > 131* 133* 133* 130*  K 2.6*  < >  --   < > 2.9*  --  4.1 4.9  CL 94*  < >  --   < > 96*  --  97* 92*  CO2 29  < >  --   < > 24  --  24 26  GLUCOSE 108*  < >  --   < > 139*  --  111* 264*  BUN 9  < >  --   < > 11 12 12  32*  CREATININE 0.69  0.74  < >  --   < > 0.70 0.7 0.73 1.57*  CALCIUM 8.8*  < >  --   < > 8.5*  --  8.7* 9.3  MG 1.9  < > 1.8  --  1.7  --  2.5*  --   PHOS 2.8  --   --   --   --   --   --   --   < > = values in this interval not displayed. Liver Function Tests:  Recent Labs  08/08/16 2224 08/13/16 2126 08/25/16 0347  AST 26 28 22   ALT 14 49 15  ALKPHOS 159* 175* 132*  BILITOT 0.4 0.6 0.6  PROT 7.6 6.6 6.5  ALBUMIN 3.8 2.9* 3.0*   No results for input(s): LIPASE, AMYLASE in the last 8760 hours.  Recent Labs  03/24/16 0027  AMMONIA 42*    CBC:  Recent Labs  08/08/16 2224  08/13/16 2126 08/17/16 08/17/16 2344 08/25/16 0347  WBC 11.0*  < > 9.1 6.5 6.5 22.1*  NEUTROABS 8.9*  --  6.7  --   --  20.6*  HGB 11.0*  < > 9.1*  --  8.5* 10.1*  HCT 34.3*  < > 29.4*  --  27.6* 33.7*  MCV 90.0  < > 91.3  --  89.6 89.2  PLT 281  < > 305  --  301 516*  < > = values in this interval not displayed. Lipid  Recent Labs  03/24/16 0027 08/17/16 0351  CHOL 175 164  HDL 66 59  LDLCALC 78 78  TRIG 154* 135    Cardiac Enzymes:  Recent Labs  11/26/15 1640 11/26/15 2300 08/13/16 2126  TROPONINI 0.07* 0.07* 0.03*   BNP: No results  for input(s): BNP in the last 8760 hours. No results found for: [facility] Lab Results  Component Value Date   HGBA1C 5.8 (H) 08/17/2016   Lab Results  Component Value Date   TSH 1.573 03/24/2016   Lab Results  Component Value Date   EPPIRJJO84 166 03/24/2016   No results found for: FOLATE Lab Results  Component Value Date   IRON 43 12/07/2014    Imaging and Procedures obtained prior to SNF admission: Dg Chest 2 View  Result Date: 08/08/2016 CLINICAL DATA:  Initial evaluation for atrial fibrillation. History of hypertension, murmur. EXAM: CHEST  2 VIEW COMPARISON:  Prior radiograph from 07/25/2016. FINDINGS: Stable cardiomegaly. Mediastinal silhouette within normal limits. Aortic atherosclerosis. Chronic lung changes with emphysema and scarring. Small bilateral pleural effusions, greater on the left. Patchy bibasilar opacities, which may reflect atelectasis or infiltrates. No overt pulmonary edema. No pneumothorax. Irregular pleural thickening at the right lung apex is stable. No acute osseus abnormality. Exaggeration of the normal thoracic kyphosis, similar to previous. Subacute to chronic right-sided rib fractures. Osteopenia. IMPRESSION: 1. Small bilateral pleural effusions, left slightly larger than right. Mild patchy bibasilar opacities may reflect atelectasis or possibly infiltrates.  2. Stable cardiomegaly without pulmonary edema. 3. Aortic atherosclerosis. Electronically Signed   By: Jeannine Boga M.D.   On: 08/08/2016 23:05   Ct Head Wo Contrast  Result Date: 08/09/2016 CLINICAL DATA:  Altered mental status for 1 day. EXAM: CT HEAD WITHOUT CONTRAST TECHNIQUE: Contiguous axial images were obtained from the base of the skull through the vertex without intravenous contrast. COMPARISON:  Head CT 03/27/2016. FINDINGS: Brain: Stable degree of atrophy and chronic small vessel ischemia. Stable appearance of remote infarct in post right parietal lobe. Mega cisterna magna incidentally noted. No hemorrhage or evidence of acute ischemia. No mass effect or midline shift. Vascular: No hyperdense vessel or unexpected calcification. Skull: Normal. Negative for fracture or focal lesion. Sinuses/Orbits: Unchanged opacification of lower bilateral mastoid air cells. Paranasal sinuses are well-aerated. Post bilateral cataract resection. Other: None. IMPRESSION: Stable degree of atrophy, chronic and remote ischemia. No CT findings of acute intracranial abnormality. Electronically Signed   By: Jeb Levering M.D.   On: 08/09/2016 00:33     Not all labs, radiology exams or other studies done during hospitalization come through on my EPIC note; however they are reviewed by me.    Assessment and Plan  SEVERE SEPSIS/ ASPIRATION PNA - Patient did have swallow evaluation showing the severe dysphagia, family made aware -Initially placed on Zosyn and transitioned to Unasyn. Appears patient has completed her antibiotic course during hospitalization -Family aware of patient's aspiration risk and would like to continue feedings. Diet was advanced to full liquid diet. snf - ADMITTED FOR RESIDENTIAL AND SUPPORTIVE CARE  PAF - CHADSVASC at least 4 -Continue Eliquis, metoprolol (increased to 50mg  BID) -Weaned off of cardizem drip -Changed cardizem to 60mg  TID to space out dose- hopefully for better  control of HR. -Have prescribed additional 30mg  cardizem as needed twice daily for HR >120. SNF - I happenend to see the pt when she was in AF with RVR; I did not think that prn cardizems would be given so I changed med wit cardizem 90 mg TID up from 60 mg TID; discussed with midlevel, she is aware  NON SUSTAINED V TACH -Likely secondary to hypokalemia -Appears to be asymptomatic -Potassium replaced, currently 4.1 -Echocardiogram done on 08/16/2016: 60-65%, wall motion was normal SNF - will monitor  ACUTE METABOLIC ENCEPHALOPATHY- 2/2 SEPSIS SNF -  pt is at baseline  ILEUS - resolved and tolerating liquid SNF - pt admitted with liquid diet  HTN SNF - stable even in AF with RVR ; cont metoprolol 50 mg BID and cardix=zem increased form 60 mg TID to 90 mg TID  PMR SNF - cont prednisone 5 mg dsaily  DM2-Was placed on insulin sliding scale during hospitalization -Hemoglobin A1c 5.8 -On no medications at home, suspect diet controlled. SNF - cont diet control of BS  STROKE - With left-sided hemiparesis -CT head without contrast shows no acute intracranial abnormalities, on 5/19 as well as 08/13/2016 -Neurology consulted and appreciated. During hospitalization, it seems that patient did have a code stroke due to her decline in mentation. -MRI on 08/16/2016 showed tiny right occipital and left temporal lobe acute versus subacute infarcts. Old right parietal/MCA territory infarct. -Neurology felt these infarcts likely to be embolic secondary to atrial fibrillation. Recommending continuing Eliquis -LDL 78 -hemoglobin A1c 5.8 SNF-cont  Aspirin 81 mg daily, Eliquis 5 mg BID  VASCULAR DEMENTIA  SNF - chronic and stable, supportive care   Time spent > 45 min;> 50% of time with patient was spent reviewing records, labs, tests and studies, counseling and developing plan of care  Inocencio Homes, MD

## 2016-08-25 ENCOUNTER — Inpatient Hospital Stay (HOSPITAL_COMMUNITY)
Admission: EM | Admit: 2016-08-25 | Discharge: 2016-08-25 | DRG: 951 | Disposition: A | Discharge status: Skilled Nursing Facility | Payer: Medicare Other | Attending: Internal Medicine | Admitting: Internal Medicine

## 2016-08-25 ENCOUNTER — Emergency Department (HOSPITAL_COMMUNITY): Payer: Medicare Other

## 2016-08-25 ENCOUNTER — Encounter (HOSPITAL_COMMUNITY): Payer: Self-pay | Admitting: Emergency Medicine

## 2016-08-25 DIAGNOSIS — I4891 Unspecified atrial fibrillation: Secondary | ICD-10-CM | POA: Diagnosis present

## 2016-08-25 DIAGNOSIS — R011 Cardiac murmur, unspecified: Secondary | ICD-10-CM | POA: Diagnosis present

## 2016-08-25 DIAGNOSIS — I69320 Aphasia following cerebral infarction: Secondary | ICD-10-CM | POA: Diagnosis not present

## 2016-08-25 DIAGNOSIS — E1136 Type 2 diabetes mellitus with diabetic cataract: Secondary | ICD-10-CM | POA: Diagnosis present

## 2016-08-25 DIAGNOSIS — I679 Cerebrovascular disease, unspecified: Secondary | ICD-10-CM | POA: Diagnosis not present

## 2016-08-25 DIAGNOSIS — A419 Sepsis, unspecified organism: Secondary | ICD-10-CM | POA: Diagnosis not present

## 2016-08-25 DIAGNOSIS — I517 Cardiomegaly: Secondary | ICD-10-CM | POA: Diagnosis present

## 2016-08-25 DIAGNOSIS — E1149 Type 2 diabetes mellitus with other diabetic neurological complication: Secondary | ICD-10-CM | POA: Diagnosis present

## 2016-08-25 DIAGNOSIS — M353 Polymyalgia rheumatica: Secondary | ICD-10-CM | POA: Diagnosis present

## 2016-08-25 DIAGNOSIS — Z515 Encounter for palliative care: Secondary | ICD-10-CM | POA: Diagnosis not present

## 2016-08-25 DIAGNOSIS — G9341 Metabolic encephalopathy: Secondary | ICD-10-CM | POA: Diagnosis present

## 2016-08-25 DIAGNOSIS — I119 Hypertensive heart disease without heart failure: Secondary | ICD-10-CM | POA: Diagnosis present

## 2016-08-25 DIAGNOSIS — I6340 Cerebral infarction due to embolism of unspecified cerebral artery: Secondary | ICD-10-CM | POA: Diagnosis not present

## 2016-08-25 DIAGNOSIS — J690 Pneumonitis due to inhalation of food and vomit: Secondary | ICD-10-CM | POA: Diagnosis present

## 2016-08-25 DIAGNOSIS — J189 Pneumonia, unspecified organism: Secondary | ICD-10-CM | POA: Diagnosis not present

## 2016-08-25 DIAGNOSIS — K219 Gastro-esophageal reflux disease without esophagitis: Secondary | ICD-10-CM | POA: Diagnosis present

## 2016-08-25 DIAGNOSIS — E1143 Type 2 diabetes mellitus with diabetic autonomic (poly)neuropathy: Secondary | ICD-10-CM | POA: Diagnosis not present

## 2016-08-25 DIAGNOSIS — K5190 Ulcerative colitis, unspecified, without complications: Secondary | ICD-10-CM | POA: Diagnosis present

## 2016-08-25 DIAGNOSIS — R1314 Dysphagia, pharyngoesophageal phase: Secondary | ICD-10-CM | POA: Diagnosis not present

## 2016-08-25 DIAGNOSIS — D649 Anemia, unspecified: Secondary | ICD-10-CM | POA: Diagnosis present

## 2016-08-25 DIAGNOSIS — I6789 Other cerebrovascular disease: Secondary | ICD-10-CM | POA: Diagnosis not present

## 2016-08-25 DIAGNOSIS — E785 Hyperlipidemia, unspecified: Secondary | ICD-10-CM | POA: Diagnosis present

## 2016-08-25 DIAGNOSIS — L89303 Pressure ulcer of unspecified buttock, stage 3: Secondary | ICD-10-CM

## 2016-08-25 DIAGNOSIS — J9600 Acute respiratory failure, unspecified whether with hypoxia or hypercapnia: Secondary | ICD-10-CM

## 2016-08-25 DIAGNOSIS — Z7189 Other specified counseling: Secondary | ICD-10-CM | POA: Diagnosis not present

## 2016-08-25 DIAGNOSIS — R0603 Acute respiratory distress: Secondary | ICD-10-CM | POA: Diagnosis present

## 2016-08-25 DIAGNOSIS — I480 Paroxysmal atrial fibrillation: Secondary | ICD-10-CM | POA: Diagnosis not present

## 2016-08-25 DIAGNOSIS — F0150 Vascular dementia without behavioral disturbance: Secondary | ICD-10-CM | POA: Diagnosis present

## 2016-08-25 DIAGNOSIS — Z66 Do not resuscitate: Secondary | ICD-10-CM | POA: Diagnosis present

## 2016-08-25 DIAGNOSIS — R531 Weakness: Secondary | ICD-10-CM | POA: Diagnosis not present

## 2016-08-25 DIAGNOSIS — R6520 Severe sepsis without septic shock: Secondary | ICD-10-CM | POA: Diagnosis not present

## 2016-08-25 DIAGNOSIS — R0602 Shortness of breath: Secondary | ICD-10-CM | POA: Diagnosis not present

## 2016-08-25 DIAGNOSIS — R1310 Dysphagia, unspecified: Secondary | ICD-10-CM | POA: Diagnosis not present

## 2016-08-25 DIAGNOSIS — E1165 Type 2 diabetes mellitus with hyperglycemia: Secondary | ICD-10-CM | POA: Diagnosis present

## 2016-08-25 DIAGNOSIS — E43 Unspecified severe protein-calorie malnutrition: Secondary | ICD-10-CM | POA: Diagnosis present

## 2016-08-25 DIAGNOSIS — I10 Essential (primary) hypertension: Secondary | ICD-10-CM | POA: Diagnosis not present

## 2016-08-25 DIAGNOSIS — IMO0002 Reserved for concepts with insufficient information to code with codable children: Secondary | ICD-10-CM | POA: Diagnosis present

## 2016-08-25 DIAGNOSIS — L89309 Pressure ulcer of unspecified buttock, unspecified stage: Secondary | ICD-10-CM | POA: Diagnosis present

## 2016-08-25 DIAGNOSIS — R4020 Unspecified coma: Secondary | ICD-10-CM | POA: Diagnosis not present

## 2016-08-25 LAB — CBC WITH DIFFERENTIAL/PLATELET
BASOS ABS: 0 10*3/uL (ref 0.0–0.1)
BASOS PCT: 0 %
EOS PCT: 0 %
Eosinophils Absolute: 0 10*3/uL (ref 0.0–0.7)
HEMATOCRIT: 33.7 % — AB (ref 36.0–46.0)
Hemoglobin: 10.1 g/dL — ABNORMAL LOW (ref 12.0–15.0)
LYMPHS PCT: 2 %
Lymphs Abs: 0.4 10*3/uL — ABNORMAL LOW (ref 0.7–4.0)
MCH: 26.7 pg (ref 26.0–34.0)
MCHC: 30 g/dL (ref 30.0–36.0)
MCV: 89.2 fL (ref 78.0–100.0)
MONO ABS: 1.2 10*3/uL — AB (ref 0.1–1.0)
MONOS PCT: 5 %
NEUTROS ABS: 20.6 10*3/uL — AB (ref 1.7–7.7)
Neutrophils Relative %: 93 %
PLATELETS: 516 10*3/uL — AB (ref 150–400)
RBC: 3.78 MIL/uL — ABNORMAL LOW (ref 3.87–5.11)
RDW: 14.5 % (ref 11.5–15.5)
WBC: 22.1 10*3/uL — ABNORMAL HIGH (ref 4.0–10.5)

## 2016-08-25 LAB — I-STAT CG4 LACTIC ACID, ED: LACTIC ACID, VENOUS: 3.04 mmol/L — AB (ref 0.5–1.9)

## 2016-08-25 LAB — COMPREHENSIVE METABOLIC PANEL
ALT: 15 U/L (ref 14–54)
ANION GAP: 12 (ref 5–15)
AST: 22 U/L (ref 15–41)
Albumin: 3 g/dL — ABNORMAL LOW (ref 3.5–5.0)
Alkaline Phosphatase: 132 U/L — ABNORMAL HIGH (ref 38–126)
BILIRUBIN TOTAL: 0.6 mg/dL (ref 0.3–1.2)
BUN: 32 mg/dL — ABNORMAL HIGH (ref 6–20)
CHLORIDE: 92 mmol/L — AB (ref 101–111)
CO2: 26 mmol/L (ref 22–32)
Calcium: 9.3 mg/dL (ref 8.9–10.3)
Creatinine, Ser: 1.57 mg/dL — ABNORMAL HIGH (ref 0.44–1.00)
GFR calc Af Amer: 36 mL/min — ABNORMAL LOW (ref >60)
GFR calc non Af Amer: 31 mL/min — ABNORMAL LOW (ref >60)
Glucose, Bld: 264 mg/dL — ABNORMAL HIGH (ref 65–99)
Potassium: 4.9 mmol/L (ref 3.5–5.1)
Sodium: 130 mmol/L — ABNORMAL LOW (ref 135–145)
TOTAL PROTEIN: 6.5 g/dL (ref 6.5–8.1)

## 2016-08-25 LAB — CBG MONITORING, ED: GLUCOSE-CAPILLARY: 254 mg/dL — AB (ref 65–99)

## 2016-08-25 MED ORDER — LORAZEPAM 1 MG PO TABS: 1.0000 mg | ORAL_TABLET | ORAL | Status: DC | PRN

## 2016-08-25 MED ORDER — LORAZEPAM 2 MG/ML PO CONC: 1.0000 mg | ORAL | Status: DC | PRN

## 2016-08-25 MED ORDER — LORAZEPAM 2 MG/ML PO CONC
1.0000 mg | ORAL | Status: DC | PRN
  Filled 2016-08-25: qty 1

## 2016-08-25 MED ORDER — GLYCOPYRROLATE 0.2 MG/ML IJ SOLN
0.2000 mg | INTRAMUSCULAR | Status: DC | PRN
  Administered 2016-08-25: 0.2 mg via INTRAVENOUS

## 2016-08-25 MED ORDER — GLYCOPYRROLATE 0.2 MG/ML IJ SOLN
0.2000 mg | INTRAMUSCULAR | Status: DC | PRN
  Filled 2016-08-25: qty 1

## 2016-08-25 MED ORDER — LORAZEPAM 2 MG/ML IJ SOLN: 1.0000 mg | INTRAMUSCULAR | Status: DC | PRN

## 2016-08-25 MED ORDER — SODIUM CHLORIDE 0.9 % IV SOLN
1.5000 g | Freq: Once | INTRAVENOUS | Status: DC
  Filled 2016-08-25: qty 1.5

## 2016-08-25 MED ORDER — LORAZEPAM 2 MG/ML PO CONC: 2.0000 mg | ORAL | Status: DC | PRN

## 2016-08-25 MED ORDER — MORPHINE SULFATE (PF) 2 MG/ML IV SOLN: 1.0000 mg | INTRAVENOUS | Status: DC | PRN

## 2016-08-25 MED ORDER — VANCOMYCIN HCL IN DEXTROSE 1-5 GM/200ML-% IV SOLN
1000.0000 mg | Freq: Once | INTRAVENOUS | Status: DC
Start: 2016-08-25 — End: 2016-08-25

## 2016-08-25 MED ORDER — MORPHINE SULFATE 10 MG/5ML PO SOLN: 10.0000 mg | ORAL | Status: DC | PRN

## 2016-08-25 MED ORDER — SCOPOLAMINE 1 MG/3DAYS TD PT72
1.0000 | MEDICATED_PATCH | TRANSDERMAL | Status: DC
  Filled 2016-08-25: qty 1

## 2016-08-25 MED ORDER — GLYCOPYRROLATE 1 MG PO TABS
1.0000 mg | ORAL_TABLET | ORAL | Status: DC | PRN
  Filled 2016-08-25: qty 1

## 2016-08-25 MED ORDER — MORPHINE SULFATE (CONCENTRATE) 10 MG/0.5ML PO SOLN
10.0000 mg | ORAL | Status: DC
  Administered 2016-08-25: 10 mg via SUBLINGUAL
  Filled 2016-08-25 (×2): qty 0.5

## 2016-08-25 MED ORDER — SODIUM CHLORIDE 0.9 % IV BOLUS (SEPSIS): 500.0000 mL | Freq: Once | INTRAVENOUS | Status: DC

## 2016-08-25 MED ORDER — NAPHAZOLINE-GLYCERIN 0.012-0.2 % OP SOLN: 1.0000 [drp] | Freq: Four times a day (QID) | OPHTHALMIC | Status: DC | PRN

## 2016-08-25 MED ORDER — MORPHINE SULFATE (CONCENTRATE) 10 MG/0.5ML PO SOLN
10.0000 mg | ORAL | Status: DC | PRN
  Administered 2016-08-25: 10 mg via SUBLINGUAL
  Filled 2016-08-25: qty 0.5

## 2016-08-25 MED ORDER — MORPHINE SULFATE (PF) 4 MG/ML IV SOLN
1.0000 mg | INTRAVENOUS | Status: DC | PRN
  Administered 2016-08-25: 1 mg via INTRAVENOUS
  Filled 2016-08-25: qty 1

## 2016-08-25 MED ORDER — SODIUM CHLORIDE 0.9 % IV BOLUS (SEPSIS): 1000.0000 mL | Freq: Once | INTRAVENOUS | Status: DC

## 2016-08-25 NOTE — Discharge Summary (Signed)
Physician Discharge Summary  Tammy Beard GYI:[id number] DOB: 02/26/1941 DOA: 08/25/2016  PCP: [name], MD  Admit date: 08/25/2016 Discharge date: 08/25/2016  Time spent: 30 minutes  Recommendations for Outpatient Follow-up:  Patient will be discharged to [facility].   Discharge Diagnoses:  Acute Respiratory distress possibly secondary to aspiration Goals of Care  Discharge Condition: Stable  Diet recommendation: Comfort  Filed Weights   08/25/16 0341  Weight: 47.2 kg (104 lb)    History of present illness:  On 08/25/2016 by Dr. Kreg Shropshire is a 76 y.o. 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  Patient was recently hospitalized from 5/18-5/31 due to aspiration pneumonia. Patient was discharged to nursing home. Per report, staff in SNF reported that pt likely aspirated sometime between the hours of 11 PM to 1:00 AM last night. Pt developed respiratory distress. Her mental status also has worsened. She is nonresponsive. Not sure if she has pain anywhere. No active nausea, vomiting or diarrhea noted.   Hospital Course:  Patient presented with acute respiratory distress. She was recently hospitalized for aspiration pneumonia and discharged to nursing facility with hospice to follow on 08/21/2016. Patient was admitted for comfort measures. Spoke with patient's brother via phone, goal of care was to make patient comfortable as possible. We discussed possible residential hospice. Palliative care was also consulted and appreciated. Patient was referred to [facility] and has an  accepted. Patient will be discharged today to [facility] for comfort measures only.  Patient's other comorbidities and medical problems include atrial fibrillation, hypertension, polymyalgia rheumatica, diabetes mellitus, GERD, anemia, history of stroke. These medical problems are not currently being treated.  Code status: DNR  Procedures: None  Consultations: Palliative Care  Discharge Exam: Vitals:   08/25/16 0432 08/25/16 0959  BP: (!) 143/102 (!) 150/94  Pulse: (!) 141 (!) 149  Resp: (!) 24 (!) 25  Temp:     Patient currently not interactive.    General: Well developed, chronically ill appearing, NAD  HEENT: NCAT, mucous membranes moist.  Cardiovascular: S1 S2 auscultated, irregularly irregular  Respiratory: Coarse breath sounds  Abdomen: Soft, nontender, nondistended, + bowel sounds  Extremities: warm dry without cyanosis clubbing or edema  Neuro: unable to assess due to current state  Psych: unable to assess due to current state  Discharge Instructions  Current Discharge Medication List    STOP taking these medications     acetaminophen (TYLENOL) 325 MG tablet      apixaban (ELIQUIS) 5 MG TABS tablet      aspirin 81 MG chewable tablet      diltiazem (CARDIZEM) 30 MG tablet      diltiazem (CARDIZEM) 90 MG tablet      divalproex (DEPAKOTE SPRINKLE) 125 MG capsule      ferrous sulfate 325 (65 FE) MG tablet      LORazepam (ATIVAN) 2 MG/ML concentrated solution      metoprolol tartrate (LOPRESSOR) 50 MG tablet      mirabegron ER (MYRBETRIQ) 25 MG TB24 tablet      Nutritional Supplements (RESOURCE 2.0 PO)      potassium chloride SA (K-DUR,KLOR-CON) 20 MEQ tablet      predniSONE (DELTASONE) 5 MG tablet  ranitidine (ZANTAC) 150 MG tablet      senna (SENOKOT) 8.6 MG TABS tablet      tuberculin (TUBERSOL) 5 UNIT/0.1ML injection      zinc oxide 20 % ointment        No Known Allergies    The results of significant diagnostics from  this hospitalization (including imaging, microbiology, ancillary and laboratory) are listed below for reference.    Significant Diagnostic Studies: Ct Angio Head W/cm &/or Wo Cm  Result Date: 08/16/2016 CLINICAL DATA:  History of CVA with LEFT-sided weakness. Atrial fibrillation. Diabetes. EXAM: CT ANGIOGRAPHY HEAD AND NECK TECHNIQUE: Multidetector CT imaging of the head and neck was performed using the standard protocol during bolus administration of intravenous contrast. Multiplanar CT image reconstructions and MIPs were obtained to evaluate the vascular anatomy. Carotid stenosis measurements (when applicable) are obtained utilizing NASCET criteria, using the distal internal carotid diameter as the denominator. CONTRAST:  50 mL Isovue 370. COMPARISON:  MR brain 08/16/2016. CT head 08/13/2016. CTA head neck 09/24/2015. FINDINGS: CT HEAD Calvarium and skull base: No fracture or destructive lesion. Mastoids and middle ears are grossly clear. Paranasal sinuses: Imaged portions are clear. Orbits: Negative. Brain: No evidence of acute abnormality, including acute infarct, hemorrhage, hydrocephalus, or mass lesion. Atrophy. Chronic microvascular ischemic change. Subacute to chronic RIGHT parietal infarct, suspected watershed distribution. CTA NECK Aortic arch: Standard branching. Imaged portion shows no evidence of aneurysm or dissection. No significant stenosis of the major arch vessel origins. Right carotid system: Minor atheromatous change the bifurcation. No evidence of dissection, stenosis (50% or greater) or occlusion. Left carotid system: Minor atheromatous change at the bifurcation. No evidence of dissection, stenosis (50% or greater) or occlusion. Vertebral arteries: Codominant. No evidence of dissection, stenosis (50% or greater) or occlusion. Nonvascular soft tissues: Biapical lung opacities, greater on the LEFT, with moderate-sized effusions. Pneumonia not excluded. No neck masses. No adenopathy.  Spondylosis. CTA HEAD Anterior circulation: Severe calcified LEFT ICA terminus stenosis. Moderate LEFT ICA cavernous stenosis. High-grade RIGHT ICA genu stenosis. LEFT M1 MCA is moderately irregular but patent. LEFT M1 stenosis in its midportion estimated 50%. Patent LEFT MCA branches. Severe distal RIGHT M1 MCA stenosis, estimated 75% or greater. No M2 MCA branch occlusion. No aneurysm, or vascular malformation. Posterior circulation: Unremarkable. No significant stenosis, proximal occlusion, aneurysm, or vascular malformation. Venous sinuses: As permitted by contrast timing, patent. Anatomic variants: None of significance. Delayed phase:   No abnormal intracranial enhancement. IMPRESSION: No extracranial stenosis of significance. 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 Subacute to chronic RIGHT parietal cortical and subcortical infarct, possible watershed insult. Electronically Signed   By: Staci Righter M.D.   On: 08/16/2016 15:48   Dg Chest 2 View  Result Date: 08/08/2016 CLINICAL DATA:  Initial evaluation for atrial fibrillation. History of hypertension, murmur. EXAM: CHEST  2 VIEW COMPARISON:  Prior radiograph from 07/25/2016. FINDINGS: Stable cardiomegaly. Mediastinal silhouette within normal limits. Aortic atherosclerosis. Chronic lung changes with emphysema and scarring. Small bilateral pleural effusions, greater on the left. Patchy bibasilar opacities, which may reflect atelectasis or infiltrates. No overt pulmonary edema. No pneumothorax. Irregular pleural thickening at the right lung apex is stable. No acute osseus abnormality. Exaggeration of the normal thoracic kyphosis, similar to previous. Subacute to chronic right-sided rib fractures. Osteopenia. IMPRESSION: 1. Small bilateral pleural effusions, left slightly larger than right. Mild patchy bibasilar opacities may reflect atelectasis or possibly infiltrates. 2. Stable  cardiomegaly without pulmonary edema.  3. Aortic atherosclerosis. Electronically Signed   By: Jeannine Boga M.D.   On: 08/08/2016 23:05   Dg Abd 1 View  Result Date: 08/13/2016 CLINICAL DATA:  Followup ileus. EXAM: ABDOMEN - 1 VIEW COMPARISON:  Earlier today FINDINGS: Unchanged diffuse gas dilatation of small and large bowel. Bladder shadow noted; no concerning mass effect. No convincing extraluminal gas. IMPRESSION: History of ileus with unchanged gaseous distension of small and large bowel. No change compared to earlier today. Electronically Signed   By: Monte Fantasia M.D.   On: 08/13/2016 14:35   Ct Head Wo Contrast  Result Date: 08/13/2016 CLINICAL DATA:  76 y/o F; aphasia and left-sided symptoms with history of stroke. Interval mental status change. EXAM: CT HEAD WITHOUT CONTRAST TECHNIQUE: Contiguous axial images were obtained from the base of the skull through the vertex without intravenous contrast. COMPARISON:  08/09/2016 CT of the head. FINDINGS: Brain: No large acute territory infarct or intracranial hemorrhage identified. Chronic right frontal parietal region infarction is unchanged from prior CT of head. Background of mild to moderate chronic microvascular ischemic changes in white matter in parenchymal volume loss. 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 Vascular: Mild calcific atherosclerosis of carotid siphons. No hyperdense vessel identified. Skull: Normal. Negative for fracture or focal lesion. Sinuses/Orbits: Partial bilateral mastoid opacification. Visualized paranasal sinuses are clear. Underpneumatized frontal sinuses. Bilateral intra-ocular lens replacement. Other: None. IMPRESSION: 1. No acute intracranial abnormality identified. 2. Stable appearance of right frontal parietal region chronic infarction, moderate chronic microvascular ischemic changes of white matter, and parenchymal volume loss of the brain. These results were called by  telephone at the time of interpretation on 08/13/2016 at 8:41 pm to Dr. Clance Boll , who verbally acknowledged these results. Electronically Signed   By: Kristine Garbe M.D.   On: 08/13/2016 20:44   Ct Head Wo Contrast  Result Date: 08/09/2016 CLINICAL DATA:  Altered mental status for 1 day. EXAM: CT HEAD WITHOUT CONTRAST TECHNIQUE: Contiguous axial images were obtained from the base of the skull through the vertex without intravenous contrast. COMPARISON:  Head CT 03/27/2016. FINDINGS: Brain: Stable degree of atrophy and chronic small vessel ischemia. Stable appearance of remote infarct in post right parietal lobe. Mega cisterna magna incidentally noted. No hemorrhage or evidence of acute ischemia. No mass effect or midline shift. Vascular: No hyperdense vessel or unexpected calcification. Skull: Normal. Negative for fracture or focal lesion. Sinuses/Orbits: Unchanged opacification of lower bilateral mastoid air cells. Paranasal sinuses are well-aerated. Post bilateral cataract resection. Other: None. IMPRESSION: Stable degree of atrophy, chronic and remote ischemia. No CT findings of acute intracranial abnormality. Electronically Signed   By: Jeb Levering M.D.   On: 08/09/2016 00:33   Ct Angio Neck W/cm &/or Wo/cm  Result Date: 08/16/2016 CLINICAL DATA:  History of CVA with LEFT-sided weakness. Atrial fibrillation. Diabetes. EXAM: CT ANGIOGRAPHY HEAD AND NECK TECHNIQUE: Multidetector CT imaging of the head and neck was performed using the standard protocol during bolus administration of intravenous contrast. Multiplanar CT image reconstructions and MIPs were obtained to evaluate the vascular anatomy. Carotid stenosis measurements (when applicable) are obtained utilizing NASCET criteria, using the distal internal carotid diameter as the denominator. CONTRAST:  50 mL Isovue 370. COMPARISON:  MR brain 08/16/2016. CT head 08/13/2016. CTA head neck 09/24/2015. FINDINGS: CT HEAD Calvarium  and skull base: No fracture or destructive lesion. Mastoids and middle ears are grossly clear. Paranasal sinuses: Imaged portions are clear. Orbits: Negative. Brain: No evidence of acute abnormality, including acute  infarct, hemorrhage, hydrocephalus, or mass lesion. Atrophy. Chronic microvascular ischemic change. Subacute to chronic RIGHT parietal infarct, suspected watershed distribution. CTA NECK Aortic arch: Standard branching. Imaged portion shows no evidence of aneurysm or dissection. No significant stenosis of the major arch vessel origins. Right carotid system: Minor atheromatous change the bifurcation. No evidence of dissection, stenosis (50% or greater) or occlusion. Left carotid system: Minor atheromatous change at the bifurcation. No evidence of dissection, stenosis (50% or greater) or occlusion. Vertebral arteries: Codominant. No evidence of dissection, stenosis (50% or greater) or occlusion. Nonvascular soft tissues: Biapical lung opacities, greater on the LEFT, with moderate-sized effusions. Pneumonia not excluded. No neck masses. No adenopathy. Spondylosis. CTA HEAD Anterior circulation: Severe calcified LEFT ICA terminus stenosis. Moderate LEFT ICA cavernous stenosis. High-grade RIGHT ICA genu stenosis. LEFT M1 MCA is moderately irregular but patent. LEFT M1 stenosis in its midportion estimated 50%. Patent LEFT MCA branches. Severe distal RIGHT M1 MCA stenosis, estimated 75% or greater. No M2 MCA branch occlusion. No aneurysm, or vascular malformation. Posterior circulation: Unremarkable. No significant stenosis, proximal occlusion, aneurysm, or vascular malformation. Venous sinuses: As permitted by contrast timing, patent. Anatomic variants: None of significance. Delayed phase:   No abnormal intracranial enhancement. IMPRESSION: No extracranial stenosis of significance. 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 Subacute to chronic  RIGHT parietal cortical and subcortical infarct, possible watershed insult. Electronically Signed   By: Staci Righter M.D.   On: 08/16/2016 15:48   Mr Brain Wo Contrast  Result Date: 08/16/2016 CLINICAL DATA:  Altered mental status, possible stroke, LEFT-sided weakness. History of stroke, hypertension, polio, hyperlipidemia. EXAM: MRI HEAD WITHOUT CONTRAST TECHNIQUE: Multiplanar, multiecho pulse sequences of the brain and surrounding structures were obtained without intravenous contrast. COMPARISON:  CT HEAD Aug 13, 2016 and MRI of the head March 23, 2016 FINDINGS: Mild to moderately motion degraded examination. BRAIN: RIGHT occipital and LEFT temporal lobe periventricular foci of reduced diffusion measuring to 4 mm, not identifiable on ADC map. Faint reduced diffusion with normalized ADC values corresponding to RIGHT parietal lobe infarct. Mild ex vacuo dilatation RIGHT lateral ventricle, no hydrocephalus. Moderate to severe ventriculomegaly on the basis of global parenchymal brain volume loss, progressed from prior imaging. Mega cisterna magna. Severe vermian atrophy. Patchy to confluent supratentorial and pontine white matter FLAIR T2 hyperintensities. No midline shift, mass effect or masses. No abnormal extra-axial fluid collections. VASCULAR: Normal major intracranial vascular flow voids present at skull base. SKULL AND UPPER CERVICAL SPINE: Partially empty sella. No suspicious calvarial bone marrow signal. Craniocervical junction maintained. SINUSES/ORBITS: Bilateral mastoid effusions. Paranasal sinuses are well-aerated. The included ocular globes and orbital contents are non-suspicious. Status post bilateral ocular lens implants. OTHER: None. IMPRESSION: Mildly motion degraded examination. Tiny RIGHT occipital and LEFT temporal lobe acute versus subacute infarcts. Old RIGHT parietal/ MCA territory infarct. Severe vermian atrophy in a background of moderate to severe parenchymal brain volume loss.  Moderate chronic small vessel ischemic disease. Electronically Signed   By: Elon Alas M.D.   On: 08/16/2016 06:54   Dg Chest Port 1 View  Result Date: 08/25/2016 CLINICAL DATA:  Shortness of breath.  Its abscess. EXAM: PORTABLE CHEST 1 VIEW COMPARISON:  Frontal and lateral views 08/08/2016 FINDINGS: Unchanged heart size and mediastinal contours with atherosclerosis of the thoracic aorta. Ill-defined patchy opacities in the left perihilar lung. Possible patchy right perihilar opacity. Small to moderate left pleural effusion suspected, possible small right pleural effusion. No pneumothorax. IMPRESSION:  1. Ill-defined patchy opacities throughout the left lung concerning for pneumonia. Suspect small to moderate left pleural effusion. Followup PA and lateral chest X-ray is recommended in 3-4 weeks following trial of antibiotic therapy to ensure resolution and exclude underlying malignancy. 2. Question additional right perihilar patchy opacity. 3. Thoracic aortic atherosclerosis. Electronically Signed   By: Jeb Levering M.D.   On: 08/25/2016 04:27   Dg Abd Portable 1v  Result Date: 08/13/2016 CLINICAL DATA:  Ileus. EXAM: PORTABLE ABDOMEN - 1 VIEW COMPARISON:  08/12/2016. FINDINGS: Persistent distended loops of small and large bowel noted. Findings consist with mild adynamic ileus. No free air. No acute bony abnormality. Degenerative changes lumbar spine and both hips. Diffuse osteopenia. Small bilateral pleural effusions . IMPRESSION: 1. Persistent distended loops of small and large bowel noted consistent with adynamic ileus. 2. Small bilateral pleural effusions. Electronically Signed   By: Marcello Moores  Register   On: 08/13/2016 07:18   Dg Abd Portable 1v  Result Date: 08/12/2016 CLINICAL DATA:  Abdominal distention and constipation for the past few days. EXAM: PORTABLE ABDOMEN - 1 VIEW COMPARISON:  Two views of the abdomen 06/29/2003. FINDINGS: Moderate and diffuse gaseous distention of small and large  bowel is identified. Stool burden is unremarkable. No abnormal abdominal calcification or focal bony abnormality. IMPRESSION: Bowel gas pattern most compatible with ileus. Electronically Signed   By: Inge Rise M.D.   On: 08/12/2016 12:08    Microbiology: No results found for this or any previous visit (from the past 240 hour(s)).   Labs: Basic Metabolic Panel:  Recent Labs Lab 08/19/16 0607 08/20/16 08/20/16 0800 08/21/16 08/21/16 0334 08/25/16 0347  NA 132* 131* 131* 133* 133* 130*  K 3.2* 2.9* 2.9*  --  4.1 4.9  CL 94*  --  96*  --  97* 92*  CO2 23  --  24  --  24 26  GLUCOSE 108*  --  139*  --  111* 264*  BUN 11 11 11 12 12  32*  CREATININE 0.85 0.7 0.70 0.7 0.73 1.57*  CALCIUM 9.1  --  8.5*  --  8.7* 9.3  MG  --   --  1.7  --  2.5*  --    Liver Function Tests:  Recent Labs Lab 08/25/16 0347  AST 22  ALT 15  ALKPHOS 132*  BILITOT 0.6  PROT 6.5  ALBUMIN 3.0*   No results for input(s): LIPASE, AMYLASE in the last 168 hours. No results for input(s): AMMONIA in the last 168 hours. CBC:  Recent Labs Lab 08/25/16 0347  WBC 22.1*  NEUTROABS 20.6*  HGB 10.1*  HCT 33.7*  MCV 89.2  PLT 516*   Cardiac Enzymes: No results for input(s): CKTOTAL, CKMB, CKMBINDEX, TROPONINI in the last 168 hours. BNP: BNP (last 3 results) No results for input(s): BNP in the last 8760 hours.  ProBNP (last 3 results) No results for input(s): PROBNP in the last 8760 hours.  CBG:  Recent Labs Lab 08/21/16 0012 08/21/16 0425 08/21/16 0739 08/21/16 1128 08/25/16 0348  GLUCAP 101* 123* 126* 211* 254*       Signed:  Cristal Ford  [facility] 08/25/2016, 12:52 PM

## 2016-08-25 NOTE — Clinical Social Work Note (Signed)
Patient will discharge to [facility] today, transported by ambulance. Family at the bedside and discharge clinicals transmitted to facility. CSW signing off as patient discharging to Hospice facility.  Remonia Otte Givens, MSW, LCSW Licensed Clinical Social Worker [place] [phone]

## 2016-08-25 NOTE — Assessment & Plan Note (Addendum)
07/25/16 MMSE  16/28 SNF for care assistance, may consider memory preserving meds.  08/08/16 Psych adding Depakote 125mg  qam

## 2016-08-25 NOTE — Assessment & Plan Note (Signed)
Serum K 4.1 08/21/16

## 2016-08-25 NOTE — Consult Note (Signed)
Consultation Note Date: 08/25/2016   Patient Name: Tammy Beard  DOB: 1941/02/01  MRN: [id number]  Age / Sex: 76 y.o., female  PCP: [name], MD Referring Physician: Cristal Ford, DO;Niu*  Reason for Consultation: Establishing goals of care and Terminal Care  HPI/Patient Profile: 76 y.o. female  with past medical history of CVA, afib with RVR, dysphagia, recurrent UTIs, HTN, HLD, polymyalgia rheumatica, GERD, anxiety admitted on 08/25/2016 with altered mental status, aspiration and decreased oxygen saturation. Patient was recently discharged to [facility] and was anticipating being admitted to [facility] services with [place] being comfort.  Chest x-ray this admission shows opacities in L lung concerning for pneumonia, suspect small to moderate left pleural effusion. Palliative medicine consulted for [place], terminal care, symptom management.  Clinical Assessment and Goals of Care: Evaluated patient at bedside. Eyes open, but she is non-responsive. Breathing rapidly and with non purposeful movements at times. I remained at bedside while RN administered 10mg  SL morphine. This appeared to improve patient's comfort slightly but after 20 mins she still looked scared with eyes wide open and brow furrowed. Discussed with RN and requested administration of lorazepam SL.  Discussed with Pete at bedside. Laurey Arrow reiterates that primary [place] is comfort. Reviewed MOST form and he indicates that he does not want antibiotics- they were only indicated if they would provide comfort- but should not be given if they are not intended for comfort.  Laurey Arrow states Fraser Din has had a difficult life and it is important to him that she have an easy death with peace and dignity.  Reviewed comfort measures with Pete. Discussed controlling symptoms of SOB and anxiety with medications rather than titrating oxygen.  Discussed possible disposition.  If there is a bed at [facility] he would like to try and transfer her there.    Primary Decision Maker HCPOA    SUMMARY OF RECOMMENDATIONS -Schedule Morphine concentrated solution SL 10mg  q2hr -Morphine 1mg  IV q1hr prn SOB -Lorazepam 1mg  SL q4hr prn - Other comfort medications as ordered -Will attempt to titrated NRB to [place] as patient's comfort allows -Referral to SW for residential hospice     Code Status/Advance Care Planning:  DNR    Symptom Management:   As above  Palliative Prophylaxis:   Delirium Protocol and Frequent Pain Assessment Frequent assessment for anxiety and SOB  Additional Recommendations (Limitations, Scope, Preferences):  Full Comfort Care  Psycho-social/Spiritual:   Desire for further Chaplaincy support:yes  Additional Recommendations: Education on Hospice  Prognosis:    Hours - Days  Discharge Planning: Hospice facility  Primary Diagnoses: Present on Admission: . Aspiration pneumonia ([place]) . Acute metabolic encephalopathy . Anemia . DM (diabetes mellitus), type 2, uncontrolled w/neurologic complication ([place]) . Dyslipidemia . Dysphagia, pharyngoesophageal phase . Essential hypertension . GERD (gastroesophageal reflux disease) . Cerebrovascular accident (CVA) due to embolism of cerebral artery ([place]) . MURMUR . Paroxysmal atrial fibrillation (HCC) . Polymyalgia rheumatica ([place]) . Pressure ulcer of buttock . Protein-calorie malnutrition, severe . Ulcerative colitis ([place]) . Vascular dementia . Sepsis (  [place]) . Atrial fibrillation with RVR ([place])   I have reviewed the medical record, interviewed the patient and family, and examined the patient. The following aspects are pertinent.  Past Medical History:  Diagnosis Date  . Colitis   . Ecchymosis 11/15/2015  . Fracture of right radius 02/2016  . GERD (gastroesophageal reflux disease)   . Heart murmur   . Hyperlipidemia   . Hypertension   . Osteopenia    s/p 7 yrs Fosamax  .  Polio   . Polymyalgia ([place])   . Rheumatic fever   . Sleep disorder   . Stroke ([place])   . Ulcerative colitis    Social History   Social History  . Marital status: Single    Spouse name: N/A  . Number of children: N/A  . Years of education: N/A   Occupational History  . retired    Social History Main Topics  . Smoking status: Never Smoker  . Smokeless tobacco: Never Used  . Alcohol use Yes     Comment: occasional once a year  . Drug use: No  . Sexual activity: No   Other Topics Concern  . None   Social History Narrative   Daily Caffeine use- soda (diet)      Do you drink/ eat things with caffeine? Yes, sometimes      Marital status:    Never                            L[facility] at [facility], transfer to skill unit 09/26/15, transfer to [place] 11/09/15. Moved to skill unit 03/28/16      Do you have any pets in your home ?(please list) No      Current or past profession: Geophysicist/field seismologist for Social worker      Do you exercise?    No                             Living Will, POA, DNR, MOST   Family History  Problem Relation Age of Onset  . Pancreatic cancer Other   . Other Mother        Coad/ Bronchiectosis  . Diabetes Paternal Grandmother   . Heart failure Paternal Grandmother   . Cancer Paternal Aunt        unaware of primary site  . Coronary artery disease Neg Hx   . Colon cancer Neg Hx   . Rectal cancer Neg Hx   . Stomach cancer Neg Hx   . Heart attack Neg Hx    Scheduled Meds: . morphine CONCENTRATE  10 mg Sublingual Q2H  . scopolamine  1 patch Transdermal Q72H   Continuous Infusions: PRN Meds:.glycopyrrolate **OR** glycopyrrolate **OR** glycopyrrolate, LORazepam **OR** LORazepam **OR** LORazepam, morphine injection, naphazoline-glycerin Medications Prior to Admission:  Prior to Admission medications   Medication Sig Start Date End Date Taking? Authorizing Provider  acetaminophen (TYLENOL) 325 MG tablet Take 650 mg by mouth every 6 (six) hours as needed for mild  pain.   Yes [provider]  apixaban (ELIQUIS) 5 MG TABS tablet Take 5 mg by mouth 2 (two) times daily.   Yes [provider]  aspirin 81 MG chewable tablet Chew 81 mg by mouth daily.   Yes [provider]  diltiazem (CARDIZEM) 30 MG tablet Take 30 mg by mouth 2 (two) times daily as needed (for heart rate greater than 120).  Yes [provider]  diltiazem (CARDIZEM) 90 MG tablet Take 90 mg by mouth 3 (three) times daily.   Yes [provider]  divalproex (DEPAKOTE SPRINKLE) 125 MG capsule Take 125 mg by mouth daily.   Yes [provider]  ferrous sulfate 325 (65 FE) MG tablet Take 325 mg by mouth every evening.   Yes [provider]  LORazepam (ATIVAN) 2 MG/ML concentrated solution Take 0.1 mLs (0.2 mg total) by mouth every 8 (eight) hours as needed for anxiety or seizure (or panic or shortness of breath due to panic). 08/21/16  Yes Mikhail, Velta Addison, DO  metoprolol tartrate (LOPRESSOR) 50 MG tablet Take 50 mg by mouth 2 (two) times daily.   Yes [provider]  mirabegron ER (MYRBETRIQ) 25 MG TB24 tablet Take 25 mg by mouth daily.   Yes [provider]  Nutritional Supplements (RESOURCE 2.0 PO) Take 120 mLs by mouth 4 (four) times daily.   Yes [provider]  potassium chloride SA (K-DUR,KLOR-CON) 20 MEQ tablet Take 20 mEq by mouth daily.   Yes [provider]  predniSONE (DELTASONE) 5 MG tablet Take 5 mg by mouth daily with breakfast.   Yes [provider]  ranitidine (ZANTAC) 150 MG tablet Take 150 mg by mouth at bedtime.   Yes [provider]  senna (SENOKOT) 8.6 MG TABS tablet Take 1 tablet by mouth daily.   Yes [provider]  tuberculin (TUBERSOL) 5 UNIT/0.1ML injection Inject 5 Units into the skin once.   Yes [provider]  zinc oxide 20 % ointment Apply 1 application topically as needed for irritation.   Yes [provider]   No Known  Allergies Review of Systems  Unable to perform ROS   Physical Exam  Constitutional:  Frail  Cardiovascular: Intact distal pulses.   tachycardic  Pulmonary/Chest: She has wheezes.  tachypneic  Abdominal: Soft.  Neurological:  nonresponsive  Skin: There is pallor.  Nursing note and vitals reviewed.   Vital Signs: BP (!) 150/94 (BP Location: Left Arm)   Pulse (!) 149   Temp 100 F (37.8 C) (Rectal)   Resp (!) 25   Ht 5\' 1"  (1.549 m)   Wt 47.2 kg (104 lb)   SpO2 100%   BMI 19.65 kg/m  Pain Assessment: Faces       SpO2: SpO2: 100 % O2 Device:SpO2: 100 % O2 Flow Rate: .   IO: Intake/output summary:  Intake/Output Summary (Last 24 hours) at 08/25/16 1118 Last data filed at 08/25/16 1000  Gross per 24 hour  Intake                0 ml  Output                0 ml  Net                0 ml    LBM:   Baseline Weight: Weight: 47.2 kg (104 lb) Most recent weight: Weight: 47.2 kg (104 lb)     Palliative Assessment/Data: PPS: 10%     Thank you for this consult. Palliative medicine will continue to follow and assist as needed.   Time In: 0900 Time Out: 1030 Time Total: 90 minutes Greater than 50%  of this time was spent counseling and coordinating care related to the above assessment and plan.  Signed by: Mariana Kaufman, AGNP-C Palliative Medicine    Please contact Palliative Medicine Team phone at [phone] for questions and concerns.  For individual provider: See Shea Evans

## 2016-08-25 NOTE — Assessment & Plan Note (Signed)
Diet controlled, Hgb a1c 6.0 07/18/16

## 2016-08-25 NOTE — Assessment & Plan Note (Signed)
-  With left-sided hemiparesis -CT head without contrast shows no acute intracranial abnormalities, on 5/19 as well as 08/13/2016 -Neurology consulted and appreciated. During hospitalization, it seems that patient did have a code stroke due to her decline in mentation. -MRI on 08/16/2016 showed tiny right occipital and left temporal lobe acute versus subacute infarcts. Old right parietal/MCA territory infarct. -Neurology felt these infarcts likely to be embolic secondary to atrial fibrillation. Recommending continuing Eliquis -LDL 78 -hemoglobin A1c 5.8 -Continue physical therapy, aspirin, Eliquis

## 2016-08-25 NOTE — Assessment & Plan Note (Addendum)
08/17/16 Hgb 8.5, Fe 325mg  qd since 08/05/16

## 2016-08-25 NOTE — Assessment & Plan Note (Signed)
Controlled. Continue Metoprolol 50mg  bid, Diltiazem 90mg  tid

## 2016-08-25 NOTE — Assessment & Plan Note (Addendum)
Fully treated, observe. Desires Hospice service, diet as tolerated.

## 2016-08-25 NOTE — Progress Notes (Signed)
Wasted 1mg  ativan sl for Tammy Beard in trash can and witnessed by the charge nurse-Kim Therapist, sports.  Pt was no longer able to be pulled up in the pyxis d/t already transferring to [facility].

## 2016-08-25 NOTE — H&P (Signed)
History and Physical    Tammy Beard EXB:[id number] DOB: 1940/04/05 DOA: 08/25/2016  Referring MD/NP/PA:   PCP: [name], MD   Patient coming from:  The patient is coming from SNF  At baseline, pt is dependent for most of ADL.   Chief Complaint: AMS and aspiration  HPI: Tammy Beard is a 76 y.o. 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  Patient was recently hospitalized from 5/18-5/31 due to aspiration pneumonia. Patient was discharged to nursing home. Per report, staff in SNF reported that pt likely aspirated sometime between the hours of 11 PM to 1:00 AM last night. Pt developed respiratory distress. Her mental status also has worsened. She is nonresponsive. Not sure if she has pain anywhere. No active nausea, vomiting or diarrhea noted.   ED Course: pt was found to have WBC 22.1, lactate 3.04, acute renal injury with creatinine 1.57, sodium 1:30, temperature 96.9, then 100.o, tachycardia with heart rate up to 170, tachypnea, oxygen saturation 93% on non-rebreather mask. Chest x-ray showed opacity in left lung and right perihilar area, and left side pleural effusion. Pt is admitted to med-surg bed for comfort care.   Review of Systems: Could not be reviewed due to nonresponsive status.  Allergy: No Known Allergies  Past Medical History:  Diagnosis Date  . Colitis   . Ecchymosis 11/15/2015  . Fracture of right radius 02/2016  . GERD (gastroesophageal reflux disease)   . Heart murmur   . Hyperlipidemia   . Hypertension   . Osteopenia    s/p 7 yrs Fosamax  . Polio   . Polymyalgia ([place])    . Rheumatic fever   . Sleep disorder   . Stroke ([place])   . Ulcerative colitis     Past Surgical History:  Procedure Laterality Date  . BREAST LUMPECTOMY  1986   left; benign  . CATARACT EXTRACTION     Left eye   . COLONOSCOPY  2006  . Colonosopy  07/05/13   Dr. Deatra Ina  . DILATION AND CURETTAGE OF UTERUS  12/1999  . MELANOMA EXCISION  2007   left arm  . NECK SURGERY  1986   Benign growth on neck removed  . TONSILLECTOMY      Social History:  reports that she has never smoked. She has never used smokeless tobacco. She reports that she drinks alcohol. She reports that she does not use drugs.  Family History:  Family History  Problem Relation Age of Onset  . Pancreatic cancer Other   . Other Mother        Coad/ Bronchiectosis  . Diabetes Paternal Grandmother   . Heart failure Paternal Grandmother   . Cancer Paternal Aunt        unaware of primary site  . Coronary artery disease Neg Hx   . Colon cancer Neg Hx   . Rectal cancer Neg Hx   . Stomach cancer Neg Hx   . Heart attack Neg Hx      Prior to Admission medications   Medication Sig Start Date End Date Taking? Authorizing Provider  aspirin 81 MG chewable tablet Chew 81 mg by mouth daily.    [provider]  LORazepam (ATIVAN) 2 MG/ML concentrated solution  Take 0.1 mLs (0.2 mg total) by mouth every 8 (eight) hours as needed for anxiety or seizure (or panic or shortness of breath due to panic). 08/21/16   Cristal Ford, DO  zinc oxide 20 % ointment Apply 1 application topically as needed for irritation.    [provider]    Physical Exam: Vitals:   08/25/16 0340 08/25/16 0341 08/25/16 0351 08/25/16 0432  BP: 131/73   (!) 143/102  Pulse: (!) 144   (!) 141  Resp: (!) 30   (!) 24  Temp:   100 F (37.8 C)   TempSrc:   Rectal   SpO2: 98%   99%  Weight:  47.2 kg (104 lb)    Height:  5\' 1"  (1.549 m)     General: Unresponsive.  HEENT:       Eyes: no scleral icterus.       ENT: No discharge from  the ears and nose.       Neck: No JVD, no bruit, no mass felt. Heme: No neck lymph node enlargement. Cardiac: S1/S2, tachycardia, irregular,  No gallops or rubs. Respiratory: Diffuse rhonchi and rales.  GI: Soft, nondistended, no organomegaly, BS present. GU: No hematuria Ext: No pitting leg edema bilaterally. 2+DP/PT pulse bilaterally. Musculoskeletal: No joint deformities, No joint redness or warmth, no limitation of ROM in spin. Skin: has pressure sacral ulcer, stage III Neuro: unresponsive, no facial droop. not moving extremities. Psych: Patient is not psychotic, no suicidal or hemocidal ideation.  Labs on Admission: I have personally reviewed following labs and imaging studies  CBC:  Recent Labs Lab 08/25/16 0347  WBC 22.1*  NEUTROABS 20.6*  HGB 10.1*  HCT 33.7*  MCV 89.2  PLT 629*   Basic Metabolic Panel:  Recent Labs Lab 08/18/16 0626  08/19/16 0607 08/20/16 08/20/16 0800 08/21/16 08/21/16 0334 08/25/16 0347  NA  --   < > 132* 131* 131* 133* 133* 130*  K  --   < > 3.2* 2.9* 2.9*  --  4.1 4.9  CL  --   --  94*  --  96*  --  97* 92*  CO2  --   --  23  --  24  --  24 26  GLUCOSE  --   --  108*  --  139*  --  111* 264*  BUN  --   < > 11 11 11 12 12  32*  CREATININE  --   < > 0.85 0.7 0.70 0.7 0.73 1.57*  CALCIUM  --   --  9.1  --  8.5*  --  8.7* 9.3  MG 1.8  --   --   --  1.7  --  2.5*  --   < > = values in this interval not displayed. GFR: Estimated Creatinine Clearance: 22.7 mL/min (A) (by C-G formula based on SCr of 1.57 mg/dL (H)). Liver Function Tests:  Recent Labs Lab 08/25/16 0347  AST 22  ALT 15  ALKPHOS 132*  BILITOT 0.6  PROT 6.5  ALBUMIN 3.0*   No results for input(s): LIPASE, AMYLASE in the last 168 hours. No results for input(s): AMMONIA in the last 168 hours. Coagulation Profile: No results for input(s): INR, PROTIME in the last 168 hours. Cardiac Enzymes: No results for input(s): CKTOTAL, CKMB, CKMBINDEX, TROPONINI in the last 168  hours. BNP (last 3 results) No results for input(s): PROBNP in the last 8760 hours. HbA1C: No results for input(s): HGBA1C in the last 72 hours. CBG:  Recent Labs Lab 08/21/16 0012 08/21/16 0425 08/21/16 0739 08/21/16 1128 08/25/16 0348  GLUCAP 101* 123* 126* 211* 254*   Lipid Profile: No results for input(s): CHOL, HDL, LDLCALC, TRIG, CHOLHDL, LDLDIRECT in the last 72 hours. Thyroid Function Tests: No results for input(s): TSH, T4TOTAL, FREET4, T3FREE, THYROIDAB in the last 72 hours. Anemia Panel: No results for input(s): VITAMINB12, FOLATE, FERRITIN, TIBC, IRON, RETICCTPCT in the last 72 hours. Urine analysis:    Component Value Date/Time   COLORURINE YELLOW 08/09/2016 0055   APPEARANCEUR TURBID (A) 08/09/2016 0055   LABSPEC 1.014 08/09/2016 0055   PHURINE 5.0 08/09/2016 0055   GLUCOSEU NEGATIVE 08/09/2016 0055   HGBUR SMALL (A) 08/09/2016 0055   HGBUR negative 09/06/2009 1459   BILIRUBINUR NEGATIVE 08/09/2016 0055   BILIRUBINUR NEG 08/23/2010 1440   KETONESUR NEGATIVE 08/09/2016 0055   PROTEINUR 30 (A) 08/09/2016 0055   UROBILINOGEN 0.2 08/23/2010 1440   UROBILINOGEN 0.2 09/06/2009 1459   NITRITE NEGATIVE 08/09/2016 0055   LEUKOCYTESUR LARGE (A) 08/09/2016 0055   Sepsis Labs: @LABRCNTIP (procalcitonin:4,lacticidven:4) )No results found for this or any previous visit (from the past 240 hour(s)).   Radiological Exams on Admission: Dg Chest Port 1 View  Result Date: 08/25/2016 CLINICAL DATA:  Shortness of breath.  Its abscess. EXAM: PORTABLE CHEST 1 VIEW COMPARISON:  Frontal and lateral views 08/08/2016 FINDINGS: Unchanged heart size and mediastinal contours with atherosclerosis of the thoracic aorta. Ill-defined patchy opacities in the left perihilar lung. Possible patchy right perihilar opacity. Small to moderate left pleural effusion suspected, possible small right pleural effusion. No pneumothorax. IMPRESSION: 1. Ill-defined patchy opacities throughout the left lung  concerning for pneumonia. Suspect small to moderate left pleural effusion. Followup PA and lateral chest X-ray is recommended in 3-4 weeks following trial of antibiotic therapy to ensure resolution and exclude underlying malignancy. 2. Question additional right perihilar patchy opacity. 3. Thoracic aortic atherosclerosis. Electronically Signed   By: Jeb Levering M.D.   On: 08/25/2016 04:27     EKG: Not done in ED. Telemetry record showed A Fib with RVR.  Assessment/Plan Principal Problem:   Comfort measures only status Active Problems:   Dyslipidemia   Essential hypertension   Ulcerative colitis ([place])   MURMUR   Polymyalgia rheumatica (HCC)   DM (diabetes mellitus), type 2, uncontrolled w/neurologic complication (HCC)   GERD (gastroesophageal reflux disease)   Sepsis (HCC)   Anemia   Cerebrovascular accident (CVA) due to embolism of cerebral artery (HCC)   Paroxysmal atrial fibrillation (HCC)   Dysphagia, pharyngoesophageal phase   Pressure ulcer of buttock   Vascular dementia   Aspiration pneumonia (HCC)   Acute metabolic encephalopathy   Protein-calorie malnutrition, severe   Atrial fibrillation with RVR (HCC)   Comfort measures only status: Patient's acute respiratory distress is most likely due to aspiration pneumonia. Chest x-ray showed  Infiltration in left lung and right hilar area. Patient is septic with leukocytosis, tachycardia, tachypnea, elevated lactic acid level. Patient also acute metabolic encephalopathy, A fib with RVR, AKI as acute issues. Pt is critically ill. I discussed her with her brother, who is her power of attorney. Per her brother, patient would want to be DO NOT RESUSCITATE, and decided to do comfort care only.  -Will admit to regular bed for comfort care. -Stop drawing labs and IVF -LORazepam 2 MG prn q4h SL for agitation -morphine 20 MG/ML concentrated solution (ROXANOL ): 5 mg prn SL q2h for pain or shortness of breath.  -Naphazoline 0.1 %  ophthalmic solution: Prn  for eye irritation -scopolamine 1.5 MG patch (1.5 mg total) onto the skin every 3 (three)  -Psycho/Social: emotional support offered to family at bedside -consult to palliterative care team in AM   Other active problems as below: will be comfort care only, no further treatment.    Dyslipidemia   A fib with RVR   Essential hypertension   Ulcerative colitis ([place])   MURMUR   Polymyalgia rheumatica (HCC)   DM (diabetes mellitus), type 2, uncontrolled w/neurologic complication (HCC)   GERD (gastroesophageal reflux disease)   Sepsis (HCC)   Anemia   Cerebrovascular accident (CVA) due to embolism of cerebral artery (HCC)   Paroxysmal atrial fibrillation (HCC)   Dysphagia, pharyngoesophageal phase   Pressure ulcer of buttock   Vascular dementia   Aspiration pneumonia (HCC)   Acute metabolic encephalopathy   Protein-calorie malnutrition, severe   Pressure ulcer-stage III  DVT ppx: none Code Status: DNR (I discussed with her brother who is her POA, and explained the meaning of CODE STATUS. Patient would want to be DNR) Family Communication: Yes, patient's brother who is her POA   at bed side Disposition Plan: to be determined Consults called: none  Admission status: medical floor/inpt  Date of Service 08/25/2016    Ivor Costa [facility] Pager [phone]  If 7PM-7AM, please contact night-coverage [email or web address] Password [email or web address] 08/25/2016, 5:51 AM

## 2016-08-25 NOTE — Assessment & Plan Note (Signed)
Managed with Prednisone 5mg  daily

## 2016-08-25 NOTE — Assessment & Plan Note (Signed)
Desires hospice service, diet as tolerated.

## 2016-08-25 NOTE — Assessment & Plan Note (Signed)
Stable, continue Senna to I tid

## 2016-08-25 NOTE — Progress Notes (Signed)
Received from ED via stretcher. No family with patient.

## 2016-08-25 NOTE — ED Triage Notes (Signed)
[facility] from [facility]. Staff reports that sometime between the hours of 11 and 1 it sounds that the patient has aspirated. Pts initial sat was 93% on 3L placed on NRB.

## 2016-08-25 NOTE — Progress Notes (Signed)
[facility] [employer] Received request from [place] for family interest in [facility]. Chart reviewed and met with HCPOA at bedside to complete paper work for transfer today. Please fax discharge summary to [phone]. RN please call report to [phone].\ Thank you,  Erling Conte, LCSW [phone]

## 2016-08-25 NOTE — Assessment & Plan Note (Signed)
Persists, continue [name] 25mg  daily. Observe the patient.

## 2016-08-25 NOTE — Assessment & Plan Note (Signed)
Change Diltiazepm 90mg  tid, continue Metoprolol 50mg  bid, monitor the patient.

## 2016-08-25 NOTE — ED Provider Notes (Signed)
[place] DEPT Provider Note   CSN: [id number] Arrival date & time: 08/25/16  9678  By signing my name below, I, Margit Banda, attest that this documentation has been prepared under the direction and in the presence of Aryanah Enslow, Barbette Hair, MD. Electronically Signed: Margit Banda, ED Scribe. 08/25/16. 3:51 AM.  LEVEL V CAVEAT: HPI and ROS limited due to pt unresponsive.   History   Chief Complaint Chief Complaint  Patient presents with  . Aspiration    HPI Tammy Beard is a 76 y.o. female BIB EMS from [facility] with a PMHx of a heart murmur who presents to the Emergency Department with a chief complaint of aspiration between 11 pm (08/24/16) and 1 am (08/25/16). Pt has a DO NOT RESUSCITATE, and MOST form. Antibiotics are okay, but no fluids. Comfort measures only.  Patient was recently admitted for aspiration pneumonia. Found to have significant aspiration on swallow study. Per the patient's brother who is her next of kin, he was arranging for her to have hospice services. His main goal is comfort care. We discussed potential treatment options. Will give 1 dose of antibiotics per her most form. Will order pain medication , anxiety medication, and medication for secretions.  Anticipate palliative consult first thing in the morning.  The history is provided by the patient. The history is limited by the condition of the patient. No language interpreter was used.    Past Medical History:  Diagnosis Date  . Colitis   . Ecchymosis 11/15/2015  . Fracture of right radius 02/2016  . GERD (gastroesophageal reflux disease)   . Heart murmur   . Hyperlipidemia   . Hypertension   . Osteopenia    s/p 7 yrs Fosamax  . Polio   . Polymyalgia ([place])   . Rheumatic fever   . Sleep disorder   . Stroke ([place])   . Ulcerative colitis     Patient Active Problem List   Diagnosis Date Noted  . HCAP (healthcare-associated pneumonia)   . Aspiration into airway   . Palliative care  encounter   . Goals of care, counseling/discussion   . Encounter for hospice care discussion   . Protein-calorie malnutrition, severe 08/14/2016  . Pressure injury of skin 08/10/2016  . Aspiration pneumonia ([place]) 08/09/2016  . Acute metabolic encephalopathy 93/81/0175  . Severe sepsis ([place]) 08/09/2016  . Paronychia of great toe, left 07/29/2016  . History of rib fracture 07/25/2016  . Vascular dementia 07/18/2016  . Weight loss 07/14/2016  . Pressure ulcer of buttock 06/17/2016  . Constipation 06/17/2016  . Chronic anticoagulation 06/10/2016  . Syncope 04/01/2016  . Psychosis 04/01/2016  . Dysphagia, pharyngoesophageal phase 04/01/2016  . Paroxysmal atrial fibrillation (HCC)   . Acute ischemic right MCA stroke ([place]) 03/24/2016  . Cerebrovascular accident (CVA) due to embolism of cerebral artery ([place]) 03/24/2016  . Left hemiparesis ([place]) 03/23/2016  . Hypokalemia 03/23/2016  . Anemia 03/20/2016  . Traumatic closed nondisplaced fracture of distal end of right radius 03/10/2016  . UTI (urinary tract infection) 02/22/2016  . Intracranial vascular stenosis 02/07/2016  . CVA, old, disturbances of vision 12/20/2015  . Abnormality of gait 11/27/2015  . Citrobacter infection   . GERD (gastroesophageal reflux disease) 11/22/2015  . Malaise 11/22/2015  . Hyponatremia 11/22/2015  . Ecchymosis 11/15/2015  . Palpitations 11/02/2015  . Atrial tachycardia ([place]) 09/25/2015  . History of stroke   . Disequilibrium   . Dizzy 09/18/2015  . 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 Diastasis recti 01/23/2015  .  DM (diabetes mellitus), type 2, uncontrolled w/neurologic complication ([place]) 02/72/5366  . Heart murmur 03/07/2014  . Polymyalgia rheumatica ([place]) 12/08/2013  . Disturbance in sleep behavior 06/28/2009  . Urinary frequency 06/28/2009  . Dyslipidemia 11/09/2008  . Essential hypertension 06/18/2007  . ALLERGIC RHINITIS 06/18/2007  . Ulcerative colitis ([place]) 06/08/2007  . OSTEOPENIA  06/08/2007  . MURMUR 06/08/2007    Past Surgical History:  Procedure Laterality Date  . BREAST LUMPECTOMY  1986   left; benign  . CATARACT EXTRACTION     Left eye   . COLONOSCOPY  2006  . Colonosopy  07/05/13   Dr. Deatra Ina  . DILATION AND CURETTAGE OF UTERUS  12/1999  . MELANOMA EXCISION  2007   left arm  . NECK SURGERY  1986   Benign growth on neck removed  . TONSILLECTOMY      OB History    No data available       Home Medications    Prior to Admission medications   Medication Sig Start Date End Date Taking? Authorizing Provider  aspirin 81 MG chewable tablet Chew 81 mg by mouth daily.    [provider]  LORazepam (ATIVAN) 2 MG/ML concentrated solution Take 0.1 mLs (0.2 mg total) by mouth every 8 (eight) hours as needed for anxiety or seizure (or panic or shortness of breath due to panic). 08/21/16   Cristal Ford, DO  zinc oxide 20 % ointment Apply 1 application topically as needed for irritation.    [provider]    Family History Family History  Problem Relation Age of Onset  . Pancreatic cancer Other   . Other Mother        Coad/ Bronchiectosis  . Diabetes Paternal Grandmother   . Heart failure Paternal Grandmother   . Cancer Paternal Aunt        unaware of primary site  . Coronary artery disease Neg Hx   . Colon cancer Neg Hx   . Rectal cancer Neg Hx   . Stomach cancer Neg Hx   . Heart attack Neg Hx     Social History Social History  Substance Use Topics  . Smoking status: Never Smoker  . Smokeless tobacco: Never Used  . Alcohol use Yes     Comment: occasional once a year     Allergies   Patient has no known allergies.   Review of Systems Review of Systems  Unable to perform ROS: Patient unresponsive     Physical Exam Updated Vital Signs BP (!) 143/102   Pulse (!) 141   Temp 100 F (37.8 C) (Rectal)   Resp (!) 24   Ht 5\' 1"  (1.549 m)   Wt 47.2 kg (104 lb)   SpO2 99%   BMI 19.65 kg/m   Physical Exam    Constitutional:  Critically ill appearing, eyes open but unresponsive, does not follow commands  HENT:  Head: Normocephalic and atraumatic.  Mucous membranes dry  Eyes: Pupils are equal, round, and reactive to light.  Cardiovascular: Normal heart sounds.   Tachycardia, irregular rhythm  Pulmonary/Chest: She is in respiratory distress. She has no wheezes. She has rales.  Nonrebreather in place, loud sonorous respirations, gurgling  Abdominal: Soft. There is no tenderness.  Musculoskeletal: She exhibits no edema.  Neurological:  Alert but does not track or follow commands  Skin: Skin is warm and dry.  Nursing note and vitals reviewed.    ED Treatments / Results  DIAGNOSTIC STUDIES: Oxygen Saturation is 98% on NRM, normal  by my interpretation.   Labs (all labs ordered are listed, but only abnormal results are displayed) Labs Reviewed  CBC WITH DIFFERENTIAL/PLATELET - Abnormal; Notable for the following:       Result Value   WBC 22.1 (*)    RBC 3.78 (*)    Hemoglobin 10.1 (*)    HCT 33.7 (*)    Platelets 516 (*)    Neutro Abs 20.6 (*)    Lymphs Abs 0.4 (*)    Monocytes Absolute 1.2 (*)    All other components within normal limits  COMPREHENSIVE METABOLIC PANEL - Abnormal; Notable for the following:    Sodium 130 (*)    Chloride 92 (*)    Glucose, Bld 264 (*)    BUN 32 (*)    Creatinine, Ser 1.57 (*)    Albumin 3.0 (*)    Alkaline Phosphatase 132 (*)    GFR calc non Af Amer 31 (*)    GFR calc Af Amer 36 (*)    All other components within normal limits  CBG MONITORING, ED - Abnormal; Notable for the following:    Glucose-Capillary 254 (*)    All other components within normal limits  I-STAT CG4 LACTIC ACID, ED - Abnormal; Notable for the following:    Lactic Acid, Venous 3.04 (*)    All other components within normal limits  CULTURE, BLOOD (ROUTINE X 2)  CULTURE, BLOOD (ROUTINE X 2)  CULTURE, BLOOD (ROUTINE X 2)  CULTURE, BLOOD (ROUTINE X 2)  URINALYSIS,  ROUTINE W REFLEX MICROSCOPIC    EKG  EKG Interpretation None       Radiology Dg Chest Port 1 View  Result Date: 08/25/2016 CLINICAL DATA:  Shortness of breath.  Its abscess. EXAM: PORTABLE CHEST 1 VIEW COMPARISON:  Frontal and lateral views 08/08/2016 FINDINGS: Unchanged heart size and mediastinal contours with atherosclerosis of the thoracic aorta. Ill-defined patchy opacities in the left perihilar lung. Possible patchy right perihilar opacity. Small to moderate left pleural effusion suspected, possible small right pleural effusion. No pneumothorax. IMPRESSION: 1. Ill-defined patchy opacities throughout the left lung concerning for pneumonia. Suspect small to moderate left pleural effusion. Followup PA and lateral chest X-ray is recommended in 3-4 weeks following trial of antibiotic therapy to ensure resolution and exclude underlying malignancy. 2. Question additional right perihilar patchy opacity. 3. Thoracic aortic atherosclerosis. Electronically Signed   By: Jeb Levering M.D.   On: 08/25/2016 04:27    Procedures Procedures (including critical care time)  CRITICAL CARE Performed by: Merryl Hacker   Total critical care time: 45 minutes  Critical care time was exclusive of separately billable procedures and treating other patients.  Critical care was necessary to treat or prevent imminent or life-threatening deterioration.  Critical care was time spent personally by me on the following activities: development of treatment plan with patient and/or surrogate as well as nursing, discussions with consultants, evaluation of patient's response to treatment, examination of patient, obtaining history from patient or surrogate, ordering and performing treatments and interventions, ordering and review of laboratory studies, ordering and review of radiographic studies, pulse oximetry and re-evaluation of patient's condition.   Medications Ordered in ED Medications   ampicillin-sulbactam (UNASYN) 1.5 g in sodium chloride 0.9 % 50 mL IVPB (not administered)  LORazepam (ATIVAN) tablet 1 mg (not administered)    Or  LORazepam (ATIVAN) 2 MG/ML concentrated solution 1 mg (not administered)    Or  LORazepam (ATIVAN) injection 1 mg (not administered)  glycopyrrolate (ROBINUL) tablet 1 mg (  Oral See Alternative 08/25/16 0449)    Or  glycopyrrolate (ROBINUL) injection 0.2 mg ( Subcutaneous See Alternative 08/25/16 0449)    Or  glycopyrrolate (ROBINUL) injection 0.2 mg (0.2 mg Intravenous Given 08/25/16 0449)  morphine 4 MG/ML injection 1 mg (1 mg Intravenous Given 08/25/16 0449)     Initial Impression / Assessment and Plan / ED Course  I have reviewed the triage vital signs and the nursing notes.  Pertinent labs & imaging results that were available during my care of the patient were reviewed by me and considered in my medical decision making (see chart for details).     Patient presents with likely aspiration pneumonia and severe sepsis. Initially noted to be hypoxic, tachycardic. Febrile to 100.0. I discussed at length [place] of care with the brother at the bedside. His foremost goals of care include comfort. I have followed her most forms and she was given one dose of Unasyn. Multiple standard orders were placed by nursing. I have tried to minimize these as a do not feel they ultimately will contribute to her comfort. Morphine, Ativan, and Robinul were ordered.  Hospitalist asked to admit with palliative consult in the morning.  Final Clinical Impressions(s) / ED Diagnoses   Final diagnoses:  Sepsis ([place])  Severe sepsis ([place])  Aspiration pneumonia of left lung due to gastric secretions, unspecified part of lung ([place])    New Prescriptions New Prescriptions   No medications on file   I personally performed the services described in this documentation, which was scribed in my presence. The recorded information has been reviewed and is accurate.      Merryl Hacker, MD 08/25/16 [phone]

## 2016-08-25 NOTE — Assessment & Plan Note (Signed)
Stable, continue Ranitidine 150mg qd.  

## 2016-08-25 NOTE — Progress Notes (Signed)
Pt transferred to [facility] via [facility].  Report called to nurse at [facility]. Pt appeared to be in no pain with shallow breathing at time of transport. Belongings sent with pt. F/m's were aware that pt would be transferred.

## 2016-08-26 DIAGNOSIS — R1310 Dysphagia, unspecified: Secondary | ICD-10-CM | POA: Diagnosis not present

## 2016-08-26 DIAGNOSIS — I10 Essential (primary) hypertension: Secondary | ICD-10-CM | POA: Diagnosis not present

## 2016-08-26 DIAGNOSIS — J189 Pneumonia, unspecified organism: Secondary | ICD-10-CM | POA: Diagnosis not present

## 2016-08-26 DIAGNOSIS — I679 Cerebrovascular disease, unspecified: Secondary | ICD-10-CM | POA: Diagnosis not present

## 2016-08-27 DIAGNOSIS — I679 Cerebrovascular disease, unspecified: Secondary | ICD-10-CM | POA: Diagnosis not present

## 2016-08-27 DIAGNOSIS — J189 Pneumonia, unspecified organism: Secondary | ICD-10-CM | POA: Diagnosis not present

## 2016-08-27 DIAGNOSIS — I10 Essential (primary) hypertension: Secondary | ICD-10-CM | POA: Diagnosis not present

## 2016-08-27 DIAGNOSIS — R1310 Dysphagia, unspecified: Secondary | ICD-10-CM | POA: Diagnosis not present

## 2016-08-29 DIAGNOSIS — T07XXXA Unspecified multiple injuries, initial encounter: Secondary | ICD-10-CM | POA: Insufficient documentation

## 2016-08-30 ENCOUNTER — Encounter: Payer: Self-pay | Admitting: Internal Medicine

## 2016-08-30 DIAGNOSIS — I472 Ventricular tachycardia: Secondary | ICD-10-CM | POA: Insufficient documentation

## 2016-08-30 DIAGNOSIS — I4729 Other ventricular tachycardia: Secondary | ICD-10-CM | POA: Insufficient documentation

## 2016-08-30 DIAGNOSIS — K567 Ileus, unspecified: Secondary | ICD-10-CM | POA: Insufficient documentation

## 2016-08-30 LAB — CULTURE, BLOOD (ROUTINE X 2)
CULTURE: NO GROWTH
Culture: NO GROWTH
SPECIAL REQUESTS: ADEQUATE
SPECIAL REQUESTS: ADEQUATE

## 2016-09-01 ENCOUNTER — Telehealth: Payer: Self-pay | Admitting: Neurology

## 2016-09-01 NOTE — Telephone Encounter (Signed)
Pt's husband called said the patient is deceased.

## 2016-09-01 NOTE — Telephone Encounter (Signed)
Noted. Really sorry to hear that. Please send my condolence to her family. Thanks.  Rosalin Hawking, MD PhD Stroke Neurology 09/01/2016 11:17 AM

## 2016-09-02 ENCOUNTER — Ambulatory Visit: Payer: Medicare Other | Admitting: Nurse Practitioner

## 2016-09-21 DEATH — deceased

## 2016-10-14 ENCOUNTER — Ambulatory Visit: Payer: Medicare Other | Admitting: Neurology

## 2017-12-19 IMAGING — CT CT ANGIO HEAD
2 of 12 series · 5 of 36 positions shown · IV contrast (OMNI 350)
Comparison: MR brain 08/16/2016. CT head 08/13/2016. CTA head neck
09/24/2015.

CLINICAL DATA: History of CVA with LEFT-sided weakness. Atrial
fibrillation. Diabetes.

EXAM:
CT ANGIOGRAPHY HEAD AND NECK
TECHNIQUE: Multidetector CT imaging of the head and neck was performed using
the standard protocol during bolus administration of intravenous
contrast. Multiplanar CT image reconstructions and MIPs were
obtained to evaluate the vascular anatomy. Carotid stenosis
measurements (when applicable) are obtained utilizing NASCET
criteria, using the distal internal carotid diameter as the
denominator.
CONTRAST:  50 mL Isovue 370.

[Series 10: sagittal · sagittal · 0.37mm/px · 1 of 66 slices shown]
[im 11/66  soft-tissue]
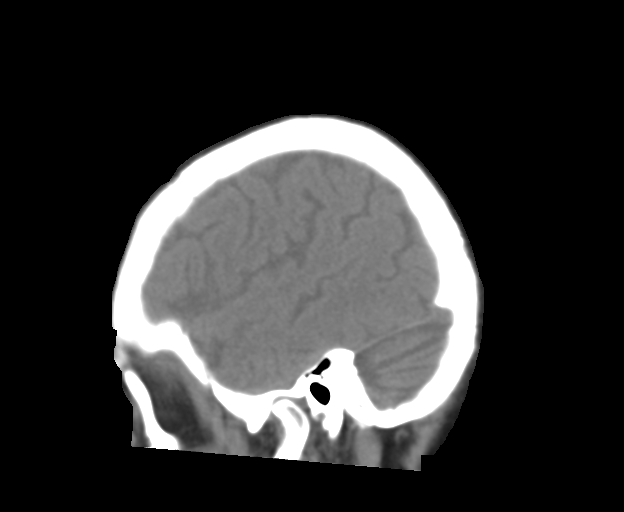

[Series 13: cta neck axial · axial · 0.47mm/px · z∈[-318,-123]mm · 4 of 326 slices shown]
[im 66/326  soft-tissue]
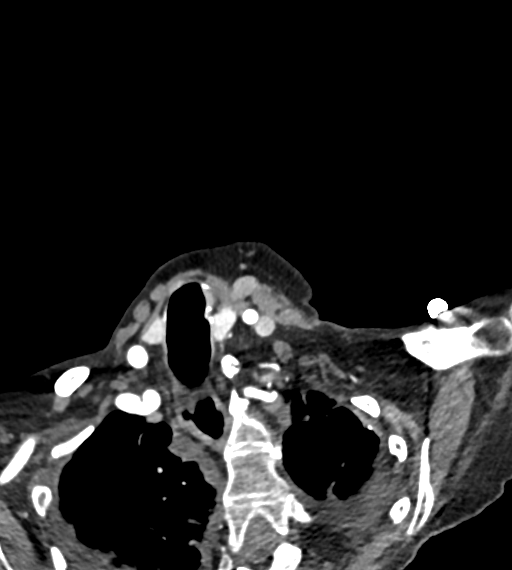
[im 131/326  bone]
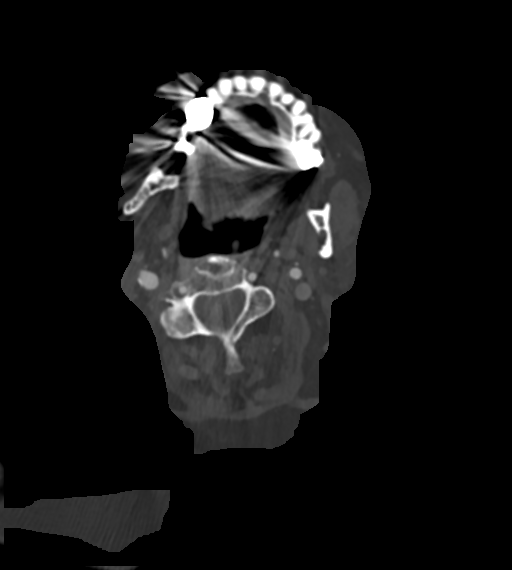
[im 196/326  soft-tissue]
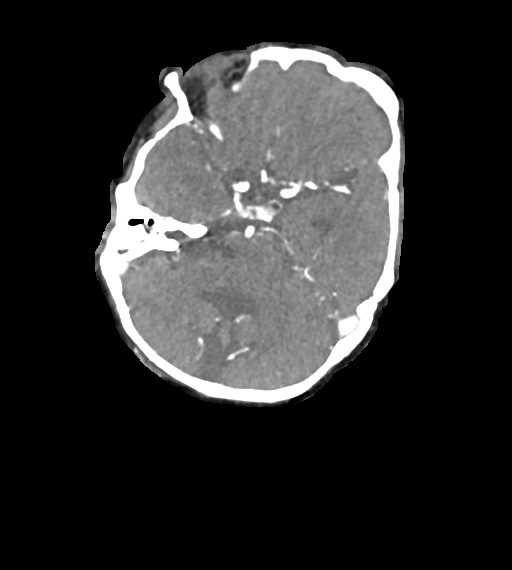
[im 261/326  bone]
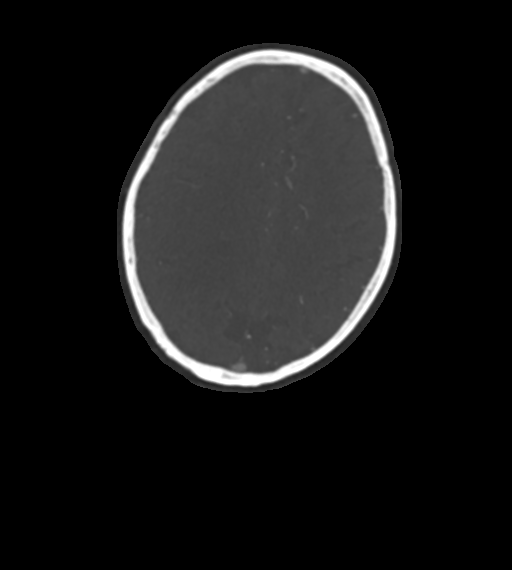

[5 of 36 positions shown; findings below may reference images not displayed]

FINDINGS: CT HEAD

Calvarium and skull base: No fracture or destructive lesion.
Mastoids and middle ears are grossly clear.

Paranasal sinuses: Imaged portions are clear.

Orbits: Negative.

Brain: No evidence of acute abnormality, including acute infarct,
hemorrhage, hydrocephalus, or mass lesion. Atrophy. Chronic
microvascular ischemic change. Subacute to chronic RIGHT parietal
infarct, suspected watershed distribution.

CTA NECK

Aortic arch: Standard branching. Imaged portion shows no evidence of
aneurysm or dissection. No significant stenosis of the major arch
vessel origins.

Right carotid system: Minor atheromatous change the bifurcation. No
evidence of dissection, stenosis (50% or greater) or occlusion.

Left carotid system: Minor atheromatous change at the bifurcation.
No evidence of dissection, stenosis (50% or greater) or occlusion.

Vertebral arteries: Codominant. No evidence of dissection, stenosis
(50% or greater) or occlusion.

Nonvascular soft tissues: Biapical lung opacities, greater on the
LEFT, with moderate-sized effusions. Pneumonia not excluded. No neck
masses. No adenopathy. Spondylosis.

CTA HEAD

Anterior circulation: Severe calcified LEFT ICA terminus stenosis.
Moderate LEFT ICA cavernous stenosis. High-grade RIGHT ICA genu
stenosis.

LEFT M1 MCA is moderately irregular but patent. LEFT M1 stenosis in
its midportion estimated 50%. Patent LEFT MCA branches.

Severe distal RIGHT M1 MCA stenosis, estimated 75% or greater. No M2
MCA branch occlusion. No aneurysm, or vascular malformation.

Posterior circulation: Unremarkable. No significant stenosis,
proximal occlusion, aneurysm, or vascular malformation.

Venous sinuses: As permitted by contrast timing, patent.

Anatomic variants: None of significance.

Delayed phase:   No abnormal intracranial enhancement.
IMPRESSION: No extracranial stenosis of significance.

BILATERAL high-grade ICA stenoses, and moderate to severe MCA
stenoses appears similar to prior CTA from 9685.

Subacute to chronic RIGHT parietal cortical and subcortical infarct,
possible watershed insult.

## 2017-12-28 IMAGING — CR DG CHEST 1V PORT
1 series · 1 of 1 positions shown · non-contrast
Comparison: Frontal and lateral views 08/08/2016

CLINICAL DATA: Shortness of breath.  Its abscess.

EXAM:
PORTABLE CHEST 1 VIEW

[AP]
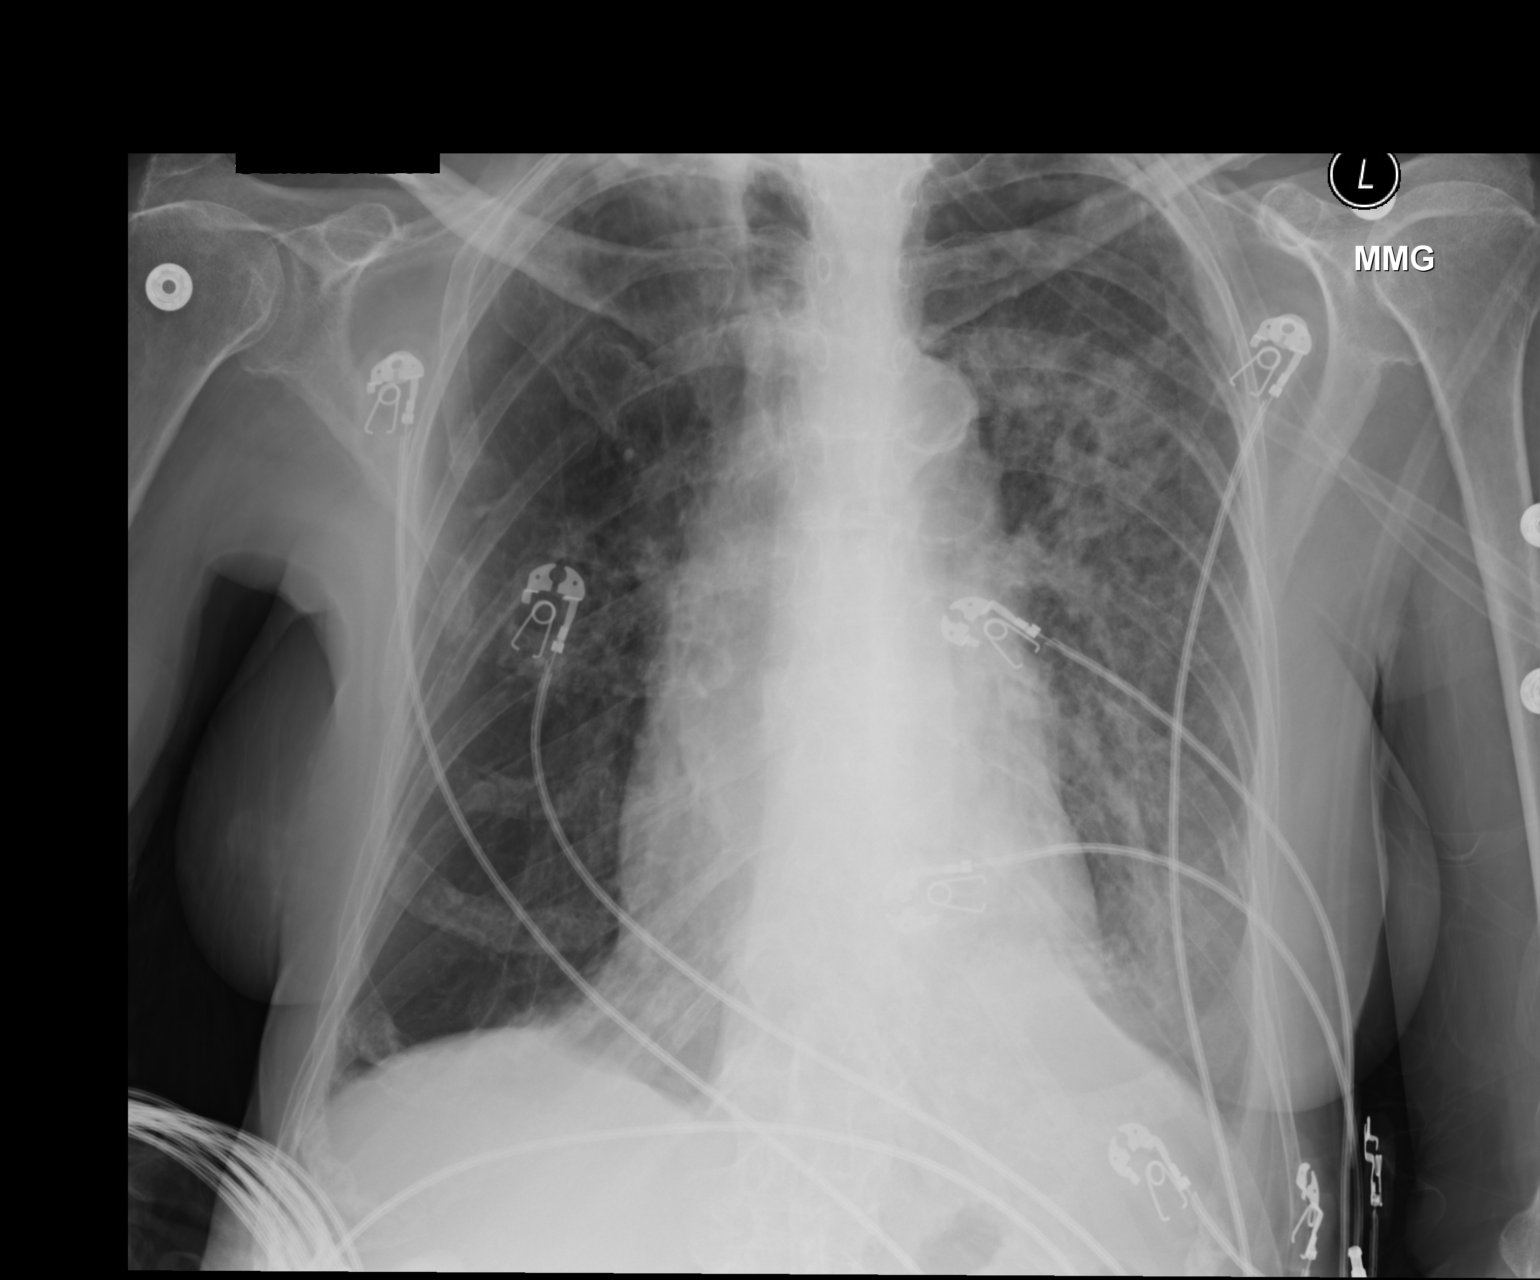

[1 of 1 positions shown; findings below may reference images not displayed]

FINDINGS: Unchanged heart size and mediastinal contours with atherosclerosis
of the thoracic aorta. Ill-defined patchy opacities in the left
perihilar lung. Possible patchy right perihilar opacity. Small to
moderate left pleural effusion suspected, possible small right
pleural effusion. No pneumothorax.
IMPRESSION: 1. Ill-defined patchy opacities throughout the left lung concerning
for pneumonia. Suspect small to moderate left pleural effusion.
Followup PA and lateral chest X-ray is recommended in 3-4 weeks
following trial of antibiotic therapy to ensure resolution and
exclude underlying malignancy.
2. Question additional right perihilar patchy opacity.
3. Thoracic aortic atherosclerosis.
